# Patient Record
Sex: Male | Born: 1955 | Hispanic: No | Smoking: Never smoker
Health system: Southern US, Community
[De-identification: ages and names within clinical notes are randomized; demographics above are authoritative.]

## PROBLEM LIST (undated history)

## (undated) DIAGNOSIS — C61 Malignant neoplasm of prostate: Secondary | ICD-10-CM

## (undated) DIAGNOSIS — M519 Unspecified thoracic, thoracolumbar and lumbosacral intervertebral disc disorder: Secondary | ICD-10-CM

## (undated) DIAGNOSIS — E785 Hyperlipidemia, unspecified: Secondary | ICD-10-CM

## (undated) DIAGNOSIS — I10 Essential (primary) hypertension: Secondary | ICD-10-CM

## (undated) HISTORY — DX: Malignant neoplasm of prostate: C61

## (undated) HISTORY — DX: Essential (primary) hypertension: I10

## (undated) HISTORY — DX: Unspecified thoracic, thoracolumbar and lumbosacral intervertebral disc disorder: M51.9

## (undated) HISTORY — DX: Hyperlipidemia, unspecified: E78.5

## (undated) HISTORY — PX: LUMBAR DISC SURGERY: SHX700

---
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
## 2004-06-20 ENCOUNTER — Emergency Department (HOSPITAL_COMMUNITY): Admission: EM | Admit: 2004-06-20 | Discharge: 2004-06-20 | Payer: Self-pay | Admitting: Emergency Medicine

## 2010-07-17 ENCOUNTER — Ambulatory Visit: Payer: Self-pay | Admitting: Surgery

## 2010-07-17 ENCOUNTER — Emergency Department (HOSPITAL_COMMUNITY): Admission: EM | Admit: 2010-07-17 | Discharge: 2010-07-17 | Payer: Self-pay | Admitting: Emergency Medicine

## 2010-09-30 ENCOUNTER — Encounter: Admission: RE | Admit: 2010-09-30 | Discharge: 2010-09-30 | Payer: Self-pay | Admitting: Rheumatology

## 2011-01-11 LAB — POCT I-STAT, CHEM 8
HCT: 50 % (ref 39.0–52.0)
Hemoglobin: 17 g/dL (ref 13.0–17.0)
Sodium: 141 mEq/L (ref 135–145)
TCO2: 25 mmol/L (ref 0–100)

## 2011-01-11 LAB — COMPREHENSIVE METABOLIC PANEL
Alkaline Phosphatase: 63 U/L (ref 39–117)
BUN: 10 mg/dL (ref 6–23)
Chloride: 108 mEq/L (ref 96–112)
Glucose, Bld: 93 mg/dL (ref 70–99)
Potassium: 3.8 mEq/L (ref 3.5–5.1)
Total Bilirubin: 0.7 mg/dL (ref 0.3–1.2)

## 2011-01-11 LAB — DIFFERENTIAL
Basophils Absolute: 0 10*3/uL (ref 0.0–0.1)
Eosinophils Absolute: 0.1 10*3/uL (ref 0.0–0.7)
Lymphocytes Relative: 29 % (ref 12–46)
Neutrophils Relative %: 63 % (ref 43–77)

## 2011-01-11 LAB — POCT CARDIAC MARKERS
Myoglobin, poc: 148 ng/mL (ref 12–200)
Myoglobin, poc: 88.1 ng/mL (ref 12–200)

## 2011-01-11 LAB — CBC
HCT: 40.5 % (ref 39.0–52.0)
MCH: 22.9 pg — ABNORMAL LOW (ref 26.0–34.0)
MCV: 64.4 fL — ABNORMAL LOW (ref 78.0–100.0)
RBC: 6.29 MIL/uL — ABNORMAL HIGH (ref 4.22–5.81)
RDW: 16.2 % — ABNORMAL HIGH (ref 11.5–15.5)
WBC: 3.6 10*3/uL — ABNORMAL LOW (ref 4.0–10.5)

## 2011-01-11 LAB — D-DIMER, QUANTITATIVE: D-Dimer, Quant: <0.22 ug/mL-FEU (ref 0.00–0.48)

## 2011-02-22 ENCOUNTER — Encounter: Payer: Self-pay | Admitting: Family Medicine

## 2011-11-08 ENCOUNTER — Encounter (HOSPITAL_COMMUNITY): Payer: Self-pay | Admitting: *Deleted

## 2011-11-08 ENCOUNTER — Emergency Department (HOSPITAL_COMMUNITY)
Admission: EM | Admit: 2011-11-08 | Discharge: 2011-11-09 | Disposition: A | Discharge status: Home / Self Care | Attending: Emergency Medicine | Admitting: Emergency Medicine

## 2011-11-08 ENCOUNTER — Emergency Department (HOSPITAL_COMMUNITY)

## 2011-11-08 DIAGNOSIS — R51 Headache: Secondary | ICD-10-CM | POA: Insufficient documentation

## 2011-11-08 DIAGNOSIS — IMO0002 Reserved for concepts with insufficient information to code with codable children: Secondary | ICD-10-CM | POA: Insufficient documentation

## 2011-11-08 DIAGNOSIS — S0003XA Contusion of scalp, initial encounter: Secondary | ICD-10-CM | POA: Insufficient documentation

## 2011-11-08 DIAGNOSIS — S060XAA Concussion with loss of consciousness status unknown, initial encounter: Secondary | ICD-10-CM | POA: Insufficient documentation

## 2011-11-08 DIAGNOSIS — Y9269 Other specified industrial and construction area as the place of occurrence of the external cause: Secondary | ICD-10-CM | POA: Insufficient documentation

## 2011-11-08 DIAGNOSIS — Y990 Civilian activity done for income or pay: Secondary | ICD-10-CM | POA: Insufficient documentation

## 2011-11-08 DIAGNOSIS — R42 Dizziness and giddiness: Secondary | ICD-10-CM | POA: Insufficient documentation

## 2011-11-08 DIAGNOSIS — W208XXA Other cause of strike by thrown, projected or falling object, initial encounter: Secondary | ICD-10-CM | POA: Insufficient documentation

## 2011-11-08 DIAGNOSIS — S060X9A Concussion with loss of consciousness of unspecified duration, initial encounter: Secondary | ICD-10-CM | POA: Insufficient documentation

## 2011-11-08 NOTE — ED Provider Notes (Signed)
History     CSN: [id number]  Arrival date & time 11/08/11  2006   First MD Initiated Contact with Patient 11/08/11 2157      Chief Complaint  Patient presents with  . Head Injury    (Consider location/radiation/quality/duration/timing/severity/associated sxs/prior treatment) HPI history from patient Patient is a 56 year old male presents with head injury. This happened a few hours prior to arrival. 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 He was not knocked out.  He however describes feeling woozy and days. He has had mild to moderate global headache since then. He has not had any altered mental status, vomiting, nausea. He has been able to ambulate without difficulty and has no weakness. He denies dental trauma or malocclusion. He also denies neck or back pain. No other injuries. Symptoms have been constant. No significant modifying factor. He has mild abrasion on his scalp. Tetanus is up-to-date.  History reviewed. No pertinent past medical history.  History reviewed. No pertinent past surgical history.  History reviewed. No pertinent family history.  History  Substance Use Topics  . Smoking status: Never Smoker   . Smokeless tobacco: Not on file  . Alcohol Use: Yes      Review of Systems  Constitutional: Negative for fever, chills and activity change.  HENT: Negative for congestion and neck pain.   Respiratory: Negative for cough, chest tightness, shortness of breath and wheezing.   Cardiovascular: Negative for chest pain.  Gastrointestinal: Negative for nausea, vomiting, abdominal pain, diarrhea and abdominal distention.  Genitourinary: Negative for difficulty urinating.  Musculoskeletal: Negative for back pain and gait problem.  Skin: Negative for rash.  Neurological: Negative for weakness and numbness.  Psychiatric/Behavioral: Negative for behavioral problems and confusion.  All  other systems reviewed and are negative.    Allergies  Review of patient's allergies indicates no known allergies.  Home Medications  No current outpatient prescriptions on file.  BP 148/78  Pulse 53  Temp(Src) 98.1 F (36.7 C) (Oral)  Resp 18  SpO2 98%  Physical Exam  Nursing note and vitals reviewed. Constitutional: He is oriented to person, place, and time. He appears well-developed and well-nourished. No distress.  HENT:  Head: Normocephalic and atraumatic.  Nose: Nose normal.       No malalignment. No dental trauma. Mild abrasion and hematoma over frontal scalp.  Eyes: EOM are normal. Pupils are equal, round, and reactive to light.  Neck: Normal range of motion. Neck supple. No JVD present.       No C spine TTP No visible injury No step offs  Cardiovascular: Normal rate, regular rhythm and intact distal pulses.   Pulmonary/Chest: Effort normal and breath sounds normal. No respiratory distress. He has no wheezes. He exhibits no tenderness.  Abdominal: Soft. He exhibits no distension. There is no tenderness.       No visible injury   Musculoskeletal: Normal range of motion. He exhibits no edema and no tenderness.       No T/L spine TTP No step offs No visible injury    Neurological: He is alert and oriented to person, place, and time. GCS eye subscore is 4. GCS verbal subscore is 5. GCS motor subscore is 6.       Normal strength  Skin: Skin is warm and dry. He is not diaphoretic.  Psychiatric: He has a normal mood and affect. His behavior is normal. Thought content  normal.    ED Course  Procedures (including critical care time)  Labs Reviewed - No data to display Ct Head Wo Contrast  11/08/2011  *RADIOLOGY REPORT*  Clinical Data: Head injury.  Head trauma.Headache.  CT HEAD WITHOUT CONTRAST  Technique:  Contiguous axial images were obtained from the base of the skull through the vertex without contrast.  Comparison: None.  Findings: Tiny lacunar infarct versus  dilated perivascular space in the right basal ganglia. No mass lesion, mass effect, midline shift, hydrocephalus, hemorrhage.  No territorial ischemia or acute infarction. There is no skull fracture.  Paranasal sinuses are within normal limits.  Mild soft tissue swelling is present over the midline of the scalp.  Probable bandage material overlying this region.  IMPRESSION: No acute intracranial abnormality.  Frontal scalp soft tissue swelling.  Original Report Authenticated By: Andreas Newport, M.D.     1. Concussion       MDM    Clinical picture is consistent with concussion. CT scan of head is negative. C-spine clinically cleared by [facility].  Patient is well appearing in discharge. Return precautions discussed as well as concussion precautions.   Milus Glazier 11/09/11 0140

## 2011-11-08 NOTE — ED Provider Notes (Signed)
56 year old male was struck by a descending a door that functions like a garage door. It struck him on his frontal area and he states he was momentarily dazed. He denies other injury. There's been no nausea vomiting or dizziness or incoordination. On examination, he has 2 small punctures in the frontal area and no laceration which is going to require repair. Because of being paced, he will have a CT scan done to make sure there is no intracranial injury. He is up-to-date on tetanus immunizations.  Dione Booze, MD 11/08/11 2230

## 2011-11-08 NOTE — ED Notes (Signed)
Lac forehead.  No bleeding at present

## 2011-11-08 NOTE — ED Notes (Signed)
He was at work at the [employer] and he was struck in the head by a bay door.  No loc.  Headache since then.

## 2011-11-09 NOTE — ED Notes (Signed)
Discharge inst given  Voiced understanding 

## 2011-11-11 NOTE — ED Provider Notes (Signed)
Medical screening examination/treatment/procedure(s) were performed by non-physician practitioner and as supervising physician I was immediately available for consultation/collaboration.   Brailey Buescher, MD 11/11/11 0827 

## 2012-10-13 ENCOUNTER — Emergency Department (HOSPITAL_BASED_OUTPATIENT_CLINIC_OR_DEPARTMENT_OTHER)
Admission: EM | Admit: 2012-10-13 | Discharge: 2012-10-13 | Disposition: A | Discharge status: Home / Self Care | Payer: Federal, State, Local not specified - PPO | Attending: Emergency Medicine | Admitting: Emergency Medicine

## 2012-10-13 ENCOUNTER — Encounter (HOSPITAL_BASED_OUTPATIENT_CLINIC_OR_DEPARTMENT_OTHER): Payer: Self-pay | Admitting: *Deleted

## 2012-10-13 DIAGNOSIS — M5432 Sciatica, left side: Secondary | ICD-10-CM

## 2012-10-13 DIAGNOSIS — Z8739 Personal history of other diseases of the musculoskeletal system and connective tissue: Secondary | ICD-10-CM | POA: Insufficient documentation

## 2012-10-13 DIAGNOSIS — M5430 Sciatica, unspecified side: Secondary | ICD-10-CM | POA: Insufficient documentation

## 2012-10-13 MED ORDER — KETOROLAC TROMETHAMINE 60 MG/2ML IM SOLN
60.0000 mg | Freq: Once | INTRAMUSCULAR | Status: AC
  Administered 2012-10-13: 60 mg via INTRAMUSCULAR
  Filled 2012-10-13: qty 2

## 2012-10-13 MED ORDER — PREDNISONE 10 MG PO TABS: ORAL_TABLET | ORAL | Status: DC

## 2012-10-13 MED ORDER — HYDROCODONE-ACETAMINOPHEN 5-325 MG PO TABS: 2.0000 | ORAL_TABLET | ORAL | Status: AC | PRN

## 2012-10-13 NOTE — ED Provider Notes (Signed)
History     CSN: [id number]  Arrival date & time 10/13/12  2053   None     Chief Complaint  Patient presents with  . Leg Pain    (Consider location/radiation/quality/duration/timing/severity/associated sxs/prior treatment) Patient is a 56 y.o. male presenting with back pain. The history is provided by the patient. No language interpreter was used.  Back Pain  This is a new problem. Episode onset: 4 days. The problem occurs constantly. The problem has been gradually worsening. The pain is associated with lifting heavy objects. The pain is present in the gluteal region. The quality of the pain is described as stabbing and shooting. The pain is at a severity of 6/10. The pain is moderate. The pain is the same all the time. Stiffness is present all day. He has tried nothing for the symptoms. The treatment provided moderate relief. Risk factors: hx of back problems.    History reviewed. No pertinent past medical history.  Past Surgical History  Procedure Date  . Back surgery     No family history on file.  History  Substance Use Topics  . Smoking status: Never Smoker   . Smokeless tobacco: Not on file  . Alcohol Use: Yes      Review of Systems  Musculoskeletal: Positive for back pain.  All other systems reviewed and are negative.    Allergies  Review of patient's allergies indicates no known allergies.  Home Medications  No current outpatient prescriptions on file.  BP 130/67  Pulse 68  Temp 98.3 F (36.8 C) (Oral)  Resp 18  Ht 6\' 4"  (1.93 m)  Wt 220 lb (99.791 kg)  BMI 26.78 kg/m2  SpO2 97%  Physical Exam  Nursing note and vitals reviewed. Constitutional: He is oriented to person, place, and time. He appears well-developed and well-nourished.  HENT:  Head: Normocephalic.  Right Ear: External ear normal.  Left Ear: External ear normal.  Nose: Nose normal.  Mouth/Throat: Oropharynx is clear and moist.  Eyes: Conjunctivae normal and EOM are normal.  Pupils are equal, round, and reactive to light.  Neck: Normal range of motion. Neck supple.  Cardiovascular: Normal rate.   Pulmonary/Chest: Effort normal and breath sounds normal.  Abdominal: Soft.  Musculoskeletal:       Tender sciatic notch area   From nv and ns intact  Neurological: He is alert and oriented to person, place, and time. He has normal reflexes.  Skin: Skin is warm.  Psychiatric: He has a normal mood and affect.    ED Course  Procedures (including critical care time)  Labs Reviewed - No data to display No results found.   No diagnosis found.    MDM    Prednisone taper,  Hydrocodone       Lonia Skinner [place], [place] 10/13/12 2327

## 2012-10-13 NOTE — ED Notes (Signed)
Pt c/o left thigh pain since Friday after doing heavy lifting. Pt sts the pain began spreading up to  His left glute on Sunday.

## 2012-10-15 NOTE — ED Provider Notes (Signed)
Medical screening examination/treatment/procedure(s) were performed by non-physician practitioner and as supervising physician I was immediately available for consultation/collaboration.   Gavin Pound. Oletta Lamas, MD 10/15/12 2108

## 2012-10-29 HISTORY — PX: LUMBAR DISC SURGERY: SHX700

## 2012-12-18 ENCOUNTER — Encounter: Payer: Self-pay | Admitting: Sports Medicine

## 2012-12-18 ENCOUNTER — Ambulatory Visit (INDEPENDENT_AMBULATORY_CARE_PROVIDER_SITE_OTHER): Payer: Federal, State, Local not specified - PPO | Admitting: Sports Medicine

## 2012-12-18 VITALS — BP 130/78 | HR 50 | Wt 228.0 lb

## 2012-12-18 DIAGNOSIS — F4389 Other reactions to severe stress: Secondary | ICD-10-CM

## 2012-12-18 DIAGNOSIS — F4329 Adjustment disorder with other symptoms: Secondary | ICD-10-CM | POA: Insufficient documentation

## 2012-12-18 DIAGNOSIS — Z299 Encounter for prophylactic measures, unspecified: Secondary | ICD-10-CM | POA: Insufficient documentation

## 2012-12-18 NOTE — Assessment & Plan Note (Signed)
Tdap one year ago, colonoscopy in 2011. Checking routine blood work. Return For complete physical.

## 2012-12-18 NOTE — Progress Notes (Signed)
Subjective:    CC: Establish care.   HPI:  Stress: Had some issues in the family, as well as stress at work. Denies any depressive symptoms but wondering if there is anyone he can talk to. Would like not to start pharmacologic intervention just yet.  Preventative measures: Last colonoscopy up-to-date, up-to-date on tetanus shots, would like some routine blood work done, otherwise no complaints.  Past medical history, Surgical history, Family history not pertinant except as noted below, Social history, Allergies, and medications have been entered into the medical record, reviewed, and no changes needed.   Review of Systems: No headache, visual changes, nausea, vomiting, diarrhea, constipation, dizziness, abdominal pain, skin rash, fevers, chills, night sweats, swollen lymph nodes, weight loss, chest pain, body aches, joint swelling, muscle aches, shortness of breath, mood changes, visual or auditory hallucinations.  Objective:    General: Well Developed, well nourished, and in no acute distress.  Neuro: Alert and oriented x3, extra-ocular muscles intact, sensation grossly intact.  HEENT: Normocephalic, atraumatic, pupils equal round reactive to light, neck supple, no masses, no lymphadenopathy, thyroid nonpalpable.  Skin: Warm and dry, no rashes noted.  Cardiac: Regular rate and rhythm, no murmurs rubs or gallops.  Respiratory: Clear to auscultation bilaterally. Not using accessory muscles, speaking in full sentences.  Abdominal: Soft, nontender, nondistended, positive bowel sounds, no masses, no organomegaly.  Musculoskeletal: Shoulder, elbow, wrist, hip, knee, ankle stable, and with full range of motion. Impression and Recommendations:   The patient was counselled, risk factors were discussed, anticipatory guidance given.

## 2012-12-18 NOTE — Assessment & Plan Note (Signed)
Patient would desire psychotherapy rather than pharmacologic therapy at this time. I will place a referral downstairs.

## 2013-01-02 ENCOUNTER — Encounter: Payer: Self-pay | Admitting: Sports Medicine

## 2013-01-09 ENCOUNTER — Encounter: Payer: Self-pay | Admitting: Sports Medicine

## 2018-07-30 DIAGNOSIS — Z1211 Encounter for screening for malignant neoplasm of colon: Secondary | ICD-10-CM | POA: Diagnosis not present

## 2018-08-13 DIAGNOSIS — K5730 Diverticulosis of large intestine without perforation or abscess without bleeding: Secondary | ICD-10-CM | POA: Diagnosis not present

## 2018-08-13 DIAGNOSIS — Z1211 Encounter for screening for malignant neoplasm of colon: Secondary | ICD-10-CM | POA: Diagnosis not present

## 2018-08-13 DIAGNOSIS — D123 Benign neoplasm of transverse colon: Secondary | ICD-10-CM | POA: Diagnosis not present

## 2018-08-13 DIAGNOSIS — K635 Polyp of colon: Secondary | ICD-10-CM | POA: Diagnosis not present

## 2018-08-13 LAB — HM COLONOSCOPY

## 2018-09-24 ENCOUNTER — Ambulatory Visit (INDEPENDENT_AMBULATORY_CARE_PROVIDER_SITE_OTHER): Payer: Federal, State, Local not specified - PPO | Admitting: Family Medicine

## 2018-09-24 ENCOUNTER — Encounter: Payer: Self-pay | Admitting: Family Medicine

## 2018-09-24 VITALS — BP 162/84 | HR 52 | Temp 98.2°F | Ht 75.0 in | Wt 214.4 lb

## 2018-09-24 DIAGNOSIS — I159 Secondary hypertension, unspecified: Secondary | ICD-10-CM

## 2018-09-24 DIAGNOSIS — R109 Unspecified abdominal pain: Secondary | ICD-10-CM

## 2018-09-24 DIAGNOSIS — Z13220 Encounter for screening for lipoid disorders: Secondary | ICD-10-CM | POA: Diagnosis not present

## 2018-09-24 DIAGNOSIS — M545 Low back pain, unspecified: Secondary | ICD-10-CM

## 2018-09-24 DIAGNOSIS — Z131 Encounter for screening for diabetes mellitus: Secondary | ICD-10-CM | POA: Diagnosis not present

## 2018-09-24 LAB — POCT URINALYSIS DIPSTICK
Bilirubin, UA: NEGATIVE
GLUCOSE UA: NEGATIVE
Ketones, UA: NEGATIVE
LEUKOCYTES UA: NEGATIVE
NITRITE UA: NEGATIVE
PH UA: 6.5 (ref 5.0–8.0)
PROTEIN UA: NEGATIVE
RBC UA: NEGATIVE
SPEC GRAV UA: 1.02 (ref 1.010–1.025)
UROBILINOGEN UA: 0.2 U/dL

## 2018-09-24 LAB — CBC WITH DIFFERENTIAL/PLATELET
BASOS PCT: 1 % (ref 0.0–3.0)
Basophils Absolute: 0 10*3/uL (ref 0.0–0.1)
EOS ABS: 0.3 10*3/uL (ref 0.0–0.7)
Eosinophils Relative: 6 % — ABNORMAL HIGH (ref 0.0–5.0)
HCT: 45.3 % (ref 39.0–52.0)
Hemoglobin: 14.9 g/dL (ref 13.0–17.0)
Lymphocytes Relative: 44.5 % (ref 12.0–46.0)
Lymphs Abs: 2 10*3/uL (ref 0.7–4.0)
MCHC: 32.9 g/dL (ref 30.0–36.0)
MCV: 69.6 fl — ABNORMAL LOW (ref 78.0–100.0)
MONO ABS: 0.4 10*3/uL (ref 0.1–1.0)
Monocytes Relative: 8.7 % (ref 3.0–12.0)
Neutro Abs: 1.8 10*3/uL (ref 1.4–7.7)
Neutrophils Relative %: 39.8 % — ABNORMAL LOW (ref 43.0–77.0)
PLATELETS: 202 10*3/uL (ref 150.0–400.0)
RBC: 6.51 Mil/uL — ABNORMAL HIGH (ref 4.22–5.81)
RDW: 16.9 % — ABNORMAL HIGH (ref 11.5–15.5)
WBC: 4.6 10*3/uL (ref 4.0–10.5)

## 2018-09-24 LAB — COMPREHENSIVE METABOLIC PANEL
ALT: 23 U/L (ref 0–53)
AST: 33 U/L (ref 0–37)
Albumin: 4.3 g/dL (ref 3.5–5.2)
Alkaline Phosphatase: 74 U/L (ref 39–117)
BILIRUBIN TOTAL: 0.8 mg/dL (ref 0.2–1.2)
BUN: 14 mg/dL (ref 6–23)
CALCIUM: 9.5 mg/dL (ref 8.4–10.5)
CHLORIDE: 105 meq/L (ref 96–112)
CO2: 28 meq/L (ref 19–32)
CREATININE: 0.82 mg/dL (ref 0.40–1.50)
GFR: 101.6 mL/min (ref >60.00)
Glucose, Bld: 84 mg/dL (ref 70–99)
Potassium: 4.4 mEq/L (ref 3.5–5.1)
SODIUM: 139 meq/L (ref 135–145)
Total Protein: 7.4 g/dL (ref 6.0–8.3)

## 2018-09-24 LAB — LIPID PANEL
CHOL/HDL RATIO: 3
Cholesterol: 201 mg/dL — ABNORMAL HIGH (ref 0–200)
HDL: 68.9 mg/dL (ref >39.00)
LDL CALC: 120 mg/dL — AB (ref 0–99)
NONHDL: 131.75
Triglycerides: 58 mg/dL (ref 0.0–149.0)
VLDL: 11.6 mg/dL (ref 0.0–40.0)

## 2018-09-24 MED ORDER — ADULT BLOOD PRESSURE CUFF LG KIT: 1.0000 | PACK | Freq: Every day | 0 refills | Status: DC

## 2018-09-24 NOTE — Progress Notes (Signed)
Michael [name] DOB: [birth year] Encounter date: 09/24/2018  This isa 62 y.o. male who presents to establish care. Chief Complaint  Patient presents with  . New Patient (Initial Visit)    back tension, comes and goes. Pt states that after he eats it goes away    History of present illness: Had colonoscopy a little over a month ago and had a polyp that was removed.   Has had some tension going on now intermittently for 6 weeks. Feels this deep in back on sides from left to right. Wondered about kidneys causing it. Feels like muscle. Sometimes gets much better but still feels a little tension there all the time. Back will pop every once in awhile and it feels better when this happens. Does seem to be going away overall. No urine odor. No dysuria. Has had kidney stones in past and is not at all similar. Did change diet, started eating more vegetables. Not had issues with kidney stones recently. Did note some relief in the back discomfort about 30 minutes after eating. Not sure if related to spices in food. Turmeric, pepper are focus.   Blood pressure was somewhat high when he went for colonoscopy as well. Not been treated for bp in the past.   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 Close to 120's/80's.   Nerve impingement 2014: when friend (massage therapist) did some work on him in past it made it feel worse. Back is much better now. No known injury. States that impingement was L5-S1. Left leg bothered him at that point.   Job is pretty physical. Does a lot of twisting, moving, walking. Some days muscles are tighter than others. Moving around easier now than he was a few weeks ago.    History reviewed. No pertinent past medical history. Past Surgical History:  Procedure Laterality Date  . Miranda SURGERY  2014  . right ankle fracture  1978   external pinning   No Known Allergies No outpatient medications have been marked as taking for the 09/24/18  encounter (Office Visit) with [name], MD.   Social History   Tobacco Use  . Smoking status: Never Smoker  . Smokeless tobacco: Never Used  Substance Use Topics  . Alcohol use: Yes    Comment: rare   Family History  Problem Relation Age of Onset  . High blood pressure Mother      Review of Systems  Constitutional: Negative for chills, fatigue and fever.  Respiratory: Negative for cough, chest tightness, shortness of breath and wheezing.   Cardiovascular: Negative for chest pain, palpitations and leg swelling.  Gastrointestinal: Negative for abdominal pain, constipation, diarrhea, nausea and vomiting.  Musculoskeletal: Positive for back pain.  Neurological: Negative for headaches.    Objective:  BP (!) 162/84   Pulse (!) 52   Temp 98.2 F (36.8 C) (Oral)   Ht 6\' 3"  (1.905 m)   Wt 214 lb 6.4 oz (97.3 kg)   SpO2 97%   BMI 26.80 kg/m   Weight: 214 lb 6.4 oz (97.3 kg)   BP Readings from Last 3 Encounters:  09/24/18 (!) 162/84  12/18/12 130/78  10/13/12 130/67   Wt Readings from Last 3 Encounters:  09/24/18 214 lb 6.4 oz (97.3 kg)  12/18/12 228 lb (103.4 kg)  10/13/12 220 lb (99.8 kg)    Physical Exam  Constitutional: He is oriented to person, place, and time. He appears well-developed and well-nourished. No  distress.  Cardiovascular: Normal rate, regular rhythm and normal heart sounds. Exam reveals no friction rub.  No murmur heard. No lower extremity edema  Pulmonary/Chest: Effort normal and breath sounds normal. No respiratory distress. He has no wheezes. He has no rales.  Abdominal: Soft. Bowel sounds are normal. He exhibits no distension and no mass. There is no tenderness. There is no guarding.  Musculoskeletal:  Tightness across lower back with flexion to 90 deg and increased with extension 10 deg.   No limit with ROM otherwise. Increased tightness with extension/twist bilat. Same with tilting.   Neurological: He is alert and oriented to  person, place, and time.  Reflex Scores:      Patellar reflexes are 2+ on the right side and 2+ on the left side.      Achilles reflexes are 2+ on the right side and 2+ on the left side. Psychiatric: His behavior is normal. Cognition and memory are normal.    Assessment/Plan:  1. Low back pain, unspecified back pain laterality, unspecified chronicity, unspecified whether sciatica present Consider natural anti-inflammatories for a start, gradual stretches.   2. Lipid screening   3. Screening for diabetes mellitus bloodwork today.   4. Flank pain Checking some bloodwork for start. Suspect more musculoskeletal at this point.   5. Secondary hypertension Check at home and record numbers; return for recheck in 1 month. Call if numbers regularly elevated at home and we can start treatment prior to visit.   Return in about 1 month (around 10/24/2018) for Chronic condition visit - blood pressure recheck.  Micheline Rough, MD

## 2018-09-24 NOTE — Patient Instructions (Signed)
Check blood pressures daily at home and record. If running over 145/90 on a regular basis, let me know. Otherwise record and bring readings and cuff to next visit.

## 2018-09-26 ENCOUNTER — Encounter: Payer: Self-pay | Admitting: Family Medicine

## 2018-10-13 ENCOUNTER — Telehealth: Payer: Self-pay | Admitting: Family Medicine

## 2018-10-13 NOTE — Telephone Encounter (Signed)
This encounter was created in error - please disregard.

## 2018-10-13 NOTE — Telephone Encounter (Signed)
.      Pt given lab results per notes of korlein on 09/24/18. Pt verbalized understanding. Patient inquiring if he would have to pay for another blood draw since labs cant be added onPatient state that he does have sickle cell trait.

## 2018-10-15 NOTE — Telephone Encounter (Signed)
Left a detailed message on verified voice mail.   

## 2018-10-15 NOTE — Telephone Encounter (Signed)
The sickle cell trait explains his bloodwork. He does not need further bloodwork.

## 2018-10-24 ENCOUNTER — Ambulatory Visit: Payer: Federal, State, Local not specified - PPO | Admitting: Family Medicine

## 2018-10-24 ENCOUNTER — Encounter: Payer: Self-pay | Admitting: Family Medicine

## 2018-10-24 VITALS — BP 138/88 | HR 53 | Temp 98.6°F | Wt 209.6 lb

## 2018-10-24 DIAGNOSIS — I10 Essential (primary) hypertension: Secondary | ICD-10-CM | POA: Diagnosis not present

## 2018-10-24 DIAGNOSIS — M545 Low back pain, unspecified: Secondary | ICD-10-CM

## 2018-10-24 NOTE — Patient Instructions (Signed)
Continue to occasionally check blood pressure at home. I will let you know about MRI results when I get them.

## 2018-10-24 NOTE — Progress Notes (Signed)
Michael [name] DOB: Oct 28, 1956 Encounter date: 10/24/2018  This is a 62 y.o. male who presents with Chief Complaint  Patient presents with  . Follow-up    lower back pain, pt states that today it is all the way across the back but sometimes pain will move from left to right side, Bp has been inconsistant at home, Avg Approx. 140/80     History of present illness: Asked to return with recorded blood pressures due to elevation at previous visit 11/27. States that lowest is 749/44 up to 967 systolic. Has been very mindful of sodium intake and has a plant based diet so surprised that bp has been elevated. Most blood pressures are over 145 with many in the 591'M systolic. Home cuff reading 169/110 today compared to our 150/100.   Still with gnawing right lower back pain. Gets some radiation of this around to side. Not unbearable, but noticeable. Been there since October. There are days he doesn't feel it at all. Notes less when moving around. Feels like discomfort moves around "like fluid". Really bothering him. Keeping him from doing some of regular exercise routines. Discomfort is stopping him.   Has been checking urine to make sure no blood, no odor. No constipation, no diarrhea. Does affect his movement. Sometimes feels tightness in back when bending over to tie shoes or move in certain ways.   Has been doing more cycling; relates this to weight loss.   Can't do jumping activities anymore; gets a jarring of spine. Hurts in lumbar area. Has had cysts along spine as well; even thoracic one that was lanced in the past. Feels stiffer; discomfort with back is limiting his ability to stretch, exercise, reach toes.   See above. Back discomfort is bothering him on daily basis. Notes pain worse with foods that are more "inflammatory".    Some mornings can't get self up without assistance of arms - has to push self up. Pain 4/10.   No Known Allergies Current Meds  Medication Sig  . Blood  Pressure Monitoring (ADULT BLOOD PRESSURE CUFF LG) KIT 1 Device by Does not apply route daily.    Review of Systems  Objective:  BP 138/88 (BP Location: Left Arm, Patient Position: Sitting, Cuff Size: Normal)   Pulse (!) 53   Temp 98.6 F (37 C) (Oral)   Wt 209 lb 9.6 oz (95.1 kg)   SpO2 98%   BMI 26.20 kg/m   Weight: 209 lb 9.6 oz (95.1 kg)   BP Readings from Last 3 Encounters:  10/24/18 138/88  09/24/18 (!) 162/84  12/18/12 130/78   Wt Readings from Last 3 Encounters:  10/24/18 209 lb 9.6 oz (95.1 kg)  09/24/18 214 lb 6.4 oz (97.3 kg)  12/18/12 228 lb (103.4 kg)    Physical Exam Musculoskeletal:     Comments: No spinal tenderness to palpation. There is muscle spasm right paraspinal musculature. There is increase pain with right twist and with extension on right side.   Neurological:     Mental Status: He is alert.     Deep Tendon Reflexes:     Reflex Scores:      Patellar reflexes are 1+ on the right side and 2+ on the left side.      Achilles reflexes are 1+ on the right side and 2+ on the left side.    Comments: Negative straight leg raise. There is slight decreased reflex right patellar, achilles.      Assessment/Plan  1. Low back pain  without sciatica, unspecified back pain laterality, unspecified chronicity Has some chronic disc issues apparent on previous imaging from 2011; pain consistent and increased with pressure sensation in lower back. Has done well in past with disc issues getting injections; would like MRI for further evaluation/diagnostic help.  - MR LUMBAR SPINE WO CONTRAST; Future  2. Hypertension, unspecified type Recheck in office today was improved. We discussed options with blood pressure and he would like to monitor - would like to     Return pending MRI results.     Micheline Rough, MD

## 2018-10-27 ENCOUNTER — Telehealth: Payer: Self-pay | Admitting: Family Medicine

## 2018-10-27 ENCOUNTER — Encounter: Payer: Self-pay | Admitting: *Deleted

## 2018-10-27 NOTE — Telephone Encounter (Signed)
Please advise. Which labs would you like ordered.

## 2018-10-27 NOTE — Telephone Encounter (Signed)
Patient called for lab results, results given per notes Dr. Ethlyn Gallery on 09/29/18, he verbalized understanding. He says he is aware of the anemia, but has not been worked up. Lab appointment scheduled for Friday, 11/07/18 at 0830. Unable to document in result notes. Orders will need to be entered.  Notes recorded by [name], MD on 09/29/2018 at 2:43 PM EST Overall bloodwork looks ok. Blood cells are small though - usually from iron deficiency or from inherited anemia (thalassemia). Looks like this has been present for some time. Is he aware of this? Further workup ever done? If not it might be reasonable to get some additional bloodwork. Unfortunately lab couldn't add on to previous bloodwork.

## 2018-11-02 ENCOUNTER — Other Ambulatory Visit: Payer: Self-pay | Admitting: Family Medicine

## 2018-11-02 ENCOUNTER — Ambulatory Visit
Admission: RE | Admit: 2018-11-02 | Discharge: 2018-11-02 | Disposition: A | Discharge status: Home / Self Care | Payer: Federal, State, Local not specified - PPO | Source: Ambulatory Visit | Attending: Family Medicine | Admitting: Family Medicine

## 2018-11-02 DIAGNOSIS — M48061 Spinal stenosis, lumbar region without neurogenic claudication: Secondary | ICD-10-CM | POA: Diagnosis not present

## 2018-11-02 DIAGNOSIS — M545 Low back pain, unspecified: Secondary | ICD-10-CM

## 2018-11-02 DIAGNOSIS — D649 Anemia, unspecified: Secondary | ICD-10-CM

## 2018-11-02 NOTE — Telephone Encounter (Signed)
Addressed in separate message

## 2018-11-02 NOTE — Progress Notes (Signed)
There have been a couple of messages regarding his bloodwork. Since he knows he has sickle cell trait, that explains his subtle anemia. It has been about 8 years since I Have a comparison, but it is stable. We could certainly make sure there is nothing worsening this like an iron deficiency, but I do not think that this is urgent. I have ordered iron studies to get a better idea of any underlying issues (please order peripheral smear as I cannot get this to go through for some reason!?), but I think it would be ok if we just monitored for stability and checked these with future bloodwork. If noting more fatigue than typical, go ahead and complete.

## 2018-11-03 NOTE — Progress Notes (Signed)
Left message for patient to call back. CRM created 

## 2018-11-05 ENCOUNTER — Encounter: Payer: Self-pay | Admitting: Family Medicine

## 2018-11-05 NOTE — Telephone Encounter (Signed)
If he would like to follow back up with them that's fine; you can put in referral for him. If we cannot see records we can get release next time he is in (he doesn't need to make special trip)

## 2018-11-07 ENCOUNTER — Other Ambulatory Visit (INDEPENDENT_AMBULATORY_CARE_PROVIDER_SITE_OTHER): Payer: Federal, State, Local not specified - PPO

## 2018-11-07 ENCOUNTER — Encounter: Payer: Self-pay | Admitting: Family Medicine

## 2018-11-07 DIAGNOSIS — D649 Anemia, unspecified: Secondary | ICD-10-CM

## 2018-11-07 LAB — FERRITIN: Ferritin: 34.5 ng/mL (ref 22.0–322.0)

## 2018-11-08 LAB — IRON, TOTAL/TOTAL IRON BINDING CAP
%SAT: 24 % (ref 20–48)
Iron: 79 ug/dL (ref 50–180)
TIBC: 324 ug/dL (ref 250–425)

## 2018-11-10 ENCOUNTER — Encounter: Payer: Self-pay | Admitting: Family Medicine

## 2018-11-11 NOTE — Progress Notes (Signed)
Left message for patient to call back  

## 2018-11-12 NOTE — Progress Notes (Signed)
Unable to reach patient by phone. I have also sent a MyChart message.

## 2018-11-14 ENCOUNTER — Encounter: Payer: Self-pay | Admitting: Family Medicine

## 2018-11-18 NOTE — Telephone Encounter (Signed)
Please advise. Does patient need an OV with you for this or will he just need a lab appointment?

## 2018-11-19 NOTE — Telephone Encounter (Signed)
Can you just call him back and get him in for 30 minute appointment sometime? He has had a few questions through my chart and concerns about further urine and blood testing. I think a discussion of options with testing and what we are looking for before I order bloodwork is important.

## 2018-11-21 ENCOUNTER — Ambulatory Visit: Payer: Federal, State, Local not specified - PPO | Admitting: Family Medicine

## 2018-11-21 ENCOUNTER — Encounter: Payer: Self-pay | Admitting: Family Medicine

## 2018-11-21 VITALS — BP 142/82 | HR 54 | Temp 97.5°F | Wt 216.7 lb

## 2018-11-21 DIAGNOSIS — Z202 Contact with and (suspected) exposure to infections with a predominantly sexual mode of transmission: Secondary | ICD-10-CM

## 2018-11-21 NOTE — Progress Notes (Signed)
[name] DOB: [birth year] Encounter date: 11/21/2018  This is a 63 y.o. male who presents with Chief Complaint  Patient presents with  . blood work    History of present illness:  [place] is here today after I asked him to come in following a few patient mychart messages. He had some back discomfort and then with some thinking started wondering about there being pathogens or parasites that he could have. He wanted to make sure that there was nothing going on in his system that could cause problems over time.   However, in the last couple of days, drank a chocolate oatmeal/blueberry plant based smoothie and noted improvement in his back discomfort within a few hours of drinking this. Wondering if he wasn't getting enough amino acids in diet and that the protein burst he had helped him out. Pain has not come back. Flexibility is better and back is better still. Had another smoothie last night. Usually in morning he has to roll on elbow to get up and this morning due to back stiffness and  was able to get right up out of bed.   Just wanting to make sure nothing hiding in body and no underlying infection. Started having concern about parasites/pathogens in system when his back discomfort was moving from one side to other. This has improved at present. No blood in stool, no blood in urine. No unusual odor to stools/urine. Only loose stools come if he eats nut mix more. No fevers, chills night sweats. No STD exposure that he is aware of although he has had gonorrhea in the remote past. Tries to be smarter about being safe now. He has not traveled outside of the country. No unusual food exposure. He follows a plant based diet.     No Known Allergies Current Meds  Medication Sig  . Blood Pressure Monitoring (ADULT BLOOD PRESSURE CUFF LG) KIT 1 Device by Does not apply route daily.    Review of Systems  Constitutional: Negative for chills, fatigue and fever.  Respiratory: Negative for  cough, chest tightness, shortness of breath and wheezing.   Cardiovascular: Negative for chest pain, palpitations and leg swelling.  Gastrointestinal: Negative for abdominal distention, abdominal pain, blood in stool, constipation, diarrhea, nausea and vomiting.  Genitourinary: Negative for difficulty urinating, discharge, dysuria, frequency, genital sores and hematuria.  Skin: Negative for rash.    Objective:  BP (!) 142/82 (BP Location: Left Arm, Patient Position: Sitting, Cuff Size: Normal)   Pulse (!) 54   Temp (!) 97.5 F (36.4 C) (Oral)   Wt 216 lb 11.2 oz (98.3 kg)   SpO2 97%   BMI 27.09 kg/m   Weight: 216 lb 11.2 oz (98.3 kg)   BP Readings from Last 3 Encounters:  11/21/18 (!) 142/82  10/24/18 138/88  09/24/18 (!) 162/84   Wt Readings from Last 3 Encounters:  11/21/18 216 lb 11.2 oz (98.3 kg)  10/24/18 209 lb 9.6 oz (95.1 kg)  09/24/18 214 lb 6.4 oz (97.3 kg)    Physical Exam Constitutional:      General: He is not in acute distress.    Appearance: He is well-developed.  Cardiovascular:     Rate and Rhythm: Normal rate and regular rhythm.     Heart sounds: Normal heart sounds. No murmur. No friction rub.  Pulmonary:     Effort: Pulmonary effort is normal. No respiratory distress.     Breath sounds: Normal breath sounds. No wheezing or rales.  Abdominal:  General: Abdomen is flat. Bowel sounds are normal. There is no distension.     Tenderness: There is no abdominal tenderness. There is no guarding or rebound.  Musculoskeletal:     Right lower leg: No edema.     Left lower leg: No edema.  Neurological:     Mental Status: He is alert and oriented to person, place, and time.  Psychiatric:        Behavior: Behavior normal.     Assessment/Plan 1. STD exposure Possible and likely remote exposure, but he would like to make sure that there is no underlying infection. No current symptoms/signs of infection.   - C. trachomatis/N. gonorrhoeae RNA - HIV  Antibody (routine testing w rflx); Future - RPR; Future - [facility] HEPATIT[facility] C SCREENING LAB - HSV(herpes simplex vrs) 1+2 ab-IgG - RPR - HIV Antibody (routine testing w rflx)  Return if symptoms worsen or fail to improve; schedule full physical for spring.   *note we are continuing to monitor blood pressure. He does not want to take any medications and prefers to do everything natural possible to control his health. We discussed risks of elevated blood pressure. We will continue to monitor.    Micheline Rough, MD

## 2018-11-21 NOTE — Patient Instructions (Signed)
I will call you once I get lab results. Keep me posted if back pain returns.   Keep check on blood pressures at home.

## 2018-11-25 ENCOUNTER — Encounter: Payer: Self-pay | Admitting: Family Medicine

## 2018-11-26 LAB — C. TRACHOMATIS/N. GONORRHOEAE RNA
C. trachomatis RNA, TMA: NOT DETECTED
N. gonorrhoeae RNA, TMA: NOT DETECTED

## 2018-11-26 LAB — HSV(HERPES SIMPLEX VRS) I + II AB-IGG
HAV 1 IGG,TYPE SPECIFIC AB: >58 index — ABNORMAL HIGH
HSV 2 IGG,TYPE SPECIFIC AB: <0.9 index

## 2018-11-26 LAB — HIV ANTIBODY (ROUTINE TESTING W REFLEX): HIV 1&2 Ab, 4th Generation: NONREACTIVE

## 2018-11-26 LAB — RPR: RPR: NONREACTIVE

## 2019-02-12 ENCOUNTER — Other Ambulatory Visit: Payer: Self-pay

## 2019-02-12 ENCOUNTER — Encounter: Payer: Self-pay | Admitting: Family Medicine

## 2019-02-12 ENCOUNTER — Ambulatory Visit (INDEPENDENT_AMBULATORY_CARE_PROVIDER_SITE_OTHER): Payer: Federal, State, Local not specified - PPO | Admitting: Family Medicine

## 2019-02-12 ENCOUNTER — Telehealth: Payer: Self-pay

## 2019-02-12 DIAGNOSIS — M545 Low back pain, unspecified: Secondary | ICD-10-CM

## 2019-02-12 NOTE — Progress Notes (Signed)
Patient ID: Michael [name], male   DOB: 1956-10-11, 63 y.o.   MRN: [id number]  Virtual Visit via Video Note  I connected with Michael [name] on 02/12/19 at 10:15 AM EDT by a video enabled telemedicine application and verified that I am speaking with the correct person using two identifiers.  Location patient: home Location provider:work or home office Persons participating in the virtual visit: patient, provider  I discussed the limitations of evaluation and management by telemedicine and the availability of in person appointments. The patient expressed understanding and agreed to proceed.   HPI: Patient is assessed today for low back pain.  He had L5-S1 discectomy back in 2012 but has done relatively well since then.  Has had flareups of back pain off and on.  Works for [employer].  Does some lifting.  Denies any recent injury.  Current pain started about 5 days ago.  He states around [place] he had several jellybeans.  He thinks that sugar intake has promoted inflammation in the past.  His pain somewhat right flank area radiates toward the buttock and lower lumbar area.  Worse with movement.  6 out of 10 in severity at its worst.  Mostly dull ache.  No fever.  No urinary symptoms.  No skin rash.  No numbness or weakness.  No radiculitis symptoms.  No recent appetite or weight changes.  Apparently takes a lot of herbs and he states that he took some oregano and thought that helped.  Has had prior history of kidney stones but he states this pain is different.  Current pain does not radiate anteriorly.   ROS: See pertinent positives and negatives per HPI.  No past medical history on file.  Past Surgical History:  Procedure Laterality Date  . [place] SURGERY  2014  . right ankle fracture  1978   external pinning    Family History  Problem Relation Age of Onset  . High blood pressure Mother     SOCIAL HX: Never smoked   Current Outpatient Medications:  .  Blood Pressure  Monitoring (ADULT BLOOD PRESSURE CUFF LG) KIT, 1 Device by Does not apply route daily., Disp: 1 each, Rfl: 0  EXAM:  VITALS per patient if applicable:  GENERAL: alert, oriented, appears well and in no acute distress  HEENT: atraumatic, conjunttiva clear, no obvious abnormalities on inspection of external nose and ears  NECK: normal movements of the head and neck  LUNGS: on inspection no signs of respiratory distress, breathing rate appears normal, no obvious gross SOB, gasping or wheezing  CV: no obvious cyanosis  MS: moves all visible extremities without noticeable abnormality  PSYCH/NEURO: pleasant and cooperative, no obvious depression or anxiety, speech and thought processing grossly intact  ASSESSMENT AND PLAN:  Discussed the following assessment and plan:  Left lumbar back pain.  Suspect musculoskeletal  -Recommend conservative measures with heat or ice -Reviewed proper lifting -Try some topical sports cream such as Biofreeze and muscle massage -Watch for any new symptoms such as radiculitis symptoms, numbness, weakness.  He will follow-up for any progressive or worsening pain     I discussed the assessment and treatment plan with the patient. The patient was provided an opportunity to ask questions and all were answered. The patient agreed with the plan and demonstrated an understanding of the instructions.   The patient was advised to call back or seek an in-person evaluation if the symptoms worsen or if the condition fails to improve as anticipated.  Carolann Littler, MD

## 2019-02-12 NOTE — Telephone Encounter (Signed)
Patient has an appointment with Dr. Elease Hashimoto at 10:15AM.

## 2019-02-12 NOTE — Telephone Encounter (Signed)
Copied from [place] [id number]. Topic: General - Other >> Feb 12, 2019 10:53 AM Nils Flack wrote: Reason for CRM: pt need letter for being off work for today and tomorrow. Please post to mchart so pt can print.

## 2019-02-12 NOTE — Telephone Encounter (Signed)
Copied from [place] [id number]. Topic: Referral - Request for Referral >> Feb 12, 2019 10:54 AM Nils Flack wrote: Has patient seen PCP for this complaint? yes *If NO, is insurance requiring patient see PCP for this issue before PCP can refer them? Referral for which specialty: pt wants referral/rx for massage chair Preferred provider/office:  Reason for referral: Cb is [phone]

## 2019-02-12 NOTE — Telephone Encounter (Signed)
Patient called again to check on the status of his referral for his massage chair and also checking on the status of his work note.

## 2019-02-13 NOTE — Telephone Encounter (Signed)
Appointment has been scheduled.

## 2019-02-13 NOTE — Telephone Encounter (Signed)
I have not done this before. If he is ordering through a company that says that it can be covered medically they should have a form. We would need to do a "face to face" though as this would be medical equipment. I would suggest he look into getting form from their company and the set up web visit once he is able to get this sent to [place] so we can review need in "visit" and document for coverage?   Check with him; maybe he knows something we don't.

## 2019-02-13 NOTE — Telephone Encounter (Signed)
Please advise. I'm not sure that we are able to order this?

## 2019-02-16 ENCOUNTER — Ambulatory Visit (INDEPENDENT_AMBULATORY_CARE_PROVIDER_SITE_OTHER): Payer: Federal, State, Local not specified - PPO | Admitting: Family Medicine

## 2019-02-16 ENCOUNTER — Encounter: Payer: Self-pay | Admitting: Family Medicine

## 2019-02-16 ENCOUNTER — Other Ambulatory Visit: Payer: Self-pay

## 2019-02-16 DIAGNOSIS — G8929 Other chronic pain: Secondary | ICD-10-CM

## 2019-02-16 DIAGNOSIS — M5441 Lumbago with sciatica, right side: Secondary | ICD-10-CM | POA: Diagnosis not present

## 2019-02-16 NOTE — Telephone Encounter (Signed)
Ok for letter

## 2019-02-16 NOTE — Progress Notes (Signed)
Virtual Visit via Video Note  I connected with@ on 02/16/19 at  8:00 AM EDT by a video enabled telemedicine application and verified that I am speaking with the correct person using two identifiers.  Location patient: home Location provider:work or home office Persons participating in the virtual visit: patient, provider  I discussed the limitations of evaluation and management by telemedicine and the availability of in person appointments. The patient expressed understanding and agreed to proceed.   Michael [name] DOB: 03/05/1956 Encounter date: 02/16/2019  This is a 63 y.o. male who presents to establish care. No chief complaint on file.   History of present illness: Has been working on drinking increase amount of water and maintaining his healthy diet to keep back discomfort down. Had some jelly beans around Easter and felt like this through off his system.   Has had hard time sitting up out of bed. Has to lift boxes for work so has to be at top functioning to be able to do this.   Also been drinking water with lemon juice to help filter system. Worries that he has a bacterial overgrowth in his system. He feels like the longer he has worked at current job the more he has had a tightness in back - hard to tie shoes, bend over, get out of bed and feels this is related to drinking bottled water at work. He has stopped drinking the bottled water and feels like this has helped him. He feels that there is a bacteria in the water jug since he hasn't noted anyone changing the filter or taking care with changing out these water jugs.   He in interested in getting massage chair to help him with his back discomfort. He uses a pull up bar to stretch out back which helps. He exercises and stretches daily. He takes good care of himself and his body. However, in last multiple months, he has had a back/flank discomfort that bothers him (see above). He still maintains activity, but has more discomfort  with changes in position. He was looking into the [employer] - sales rep wrote on brochure that it was FDA approved medical device. He called back sales rep and was put in contact with someone through company. He is waiting on her to get back in touch with him.   He also has been using horseradish (Gold's) which has been seeming to help with "inflammation in bowels". He was considering healing aspects of the root and what he has read about it. Also thinks this might help with cysts. He has had cyst removal from thoracic area near scapula in the past and hopes that the horseradish will help prevent these.  Had lumbar disc surgery in 2014. Pressure in lower lumbar and upper sacral spine. Went for massage but afterwards had worse time getting up off massage table. Pain worsened over time from that standpoint. 3-4 months before operation felt like this continued to get worse. 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 Had to put massage therapy aside due to this and focus on lighter duty work. Massage treatments kept worsening pain.   Pain felt better pretty immediately after surgery. Improved with his exercises and maintaining flexibility and strength within the lumbar spine.   Chair per patient measures area of back. He states that he needs attention to lumbar/sacral area. When he tried this chair he had some good relief in this area of back. Took away  his underlying tension. Feels that without this he just continues to tighten up. Work does aggravate this with lifting.   He does not wear back brace. He does noticed tightness down into right leg. Has difficulty with stretching once his back flares and has harder time improving muscles due to this tension. Affects gluts and hamstring.   Weds is when back was bothering him most. Had been using shovel in garden trying to loosen upper layer of sod. Thinks this flared him up. Has had flares without obvious physical  trigger. Some days just wakes up and has difficulty with tying shoes.    No past medical history on file. Past Surgical History:  Procedure Laterality Date  . [place] SURGERY  2014  . right ankle fracture  1978   external pinning   No Known Allergies Current Meds  Medication Sig  . Blood Pressure Monitoring (ADULT BLOOD PRESSURE CUFF LG) KIT 1 Device by Does not apply route daily.   Social History   Tobacco Use  . Smoking status: Never Smoker  . Smokeless tobacco: Never Used  Substance Use Topics  . Alcohol use: Yes    Comment: rare   Family History  Problem Relation Age of Onset  . High blood pressure Mother      Review of Systems  Constitutional: Negative for chills, fatigue and fever.  Respiratory: Negative for cough, chest tightness, shortness of breath and wheezing.   Cardiovascular: Negative for chest pain, palpitations and leg swelling.  Musculoskeletal: Positive for back pain (lumbar with some tension into buttock/hamstrings).  Neurological: Negative for numbness.    Objective:  There were no vitals taken for this visit.      BP Readings from Last 3 Encounters:  11/21/18 (!) 142/82  10/24/18 138/88  09/24/18 (!) 162/84   Wt Readings from Last 3 Encounters:  11/21/18 216 lb 11.2 oz (98.3 kg)  10/24/18 209 lb 9.6 oz (95.1 kg)  09/24/18 214 lb 6.4 oz (97.3 kg)    EXAM:  GENERAL: alert, oriented, appears well and in no acute distress  HEENT: atraumatic, conjunctiva clear, no obvious abnormalities on inspection of external nose and ears  NECK: normal movements of the head and neck  LUNGS: on inspection no signs of respiratory distress, breathing rate appears normal, no obvious gross SOB, gasping or wheezing  CV: no obvious cyanosis  MS: moves all visible extremities without noticeable abnormality. He is able to get up and down from chair (although today is doing better since he has been moving around and stretching already this morning).    PSYCH/NEURO: pleasant and cooperative, no obvious depression or anxiety, speech and thought processing grossly intact  Assessment/Plan 1. Chronic midline low back pain with right-sided sciatica He is in contact with rep from [place] and will get paperwork to me to look over medical need for chair. I do think that this would be a helpful device for him. He really does everything on his end with back care in terms of maintaining core strength, stretching, etc so this would be an additional modality to help him with maintaining high function.     I discussed the assessment and treatment plan with the patient. The patient was provided an opportunity to ask questions and all were answered. The patient agreed with the plan and demonstrated an understanding of the instructions.   The patient was advised to call back or seek an in-person evaluation if the symptoms worsen or if the condition fails to improve as  anticipated.    Micheline Rough, MD

## 2019-02-16 NOTE — Telephone Encounter (Unsigned)
Copied from [place] [id number]. Topic: General - Other >> Feb 16, 2019  2:17 PM Nils Flack wrote: Reason for CRM: [phone] Pt needs work note for missing work Thurs 16th and Fri 17th . Please post to mychart so pt can print.  Call above number if questions

## 2019-03-06 ENCOUNTER — Encounter: Payer: Self-pay | Admitting: Family Medicine

## 2019-10-28 ENCOUNTER — Telehealth: Payer: Self-pay | Admitting: Family Medicine

## 2019-10-28 NOTE — Telephone Encounter (Signed)
Pt came in and dropped off FMLA form that need to be completed by the provider.  Upon completion pt would like to be called [phone] to pick form up.  Form placed in providers folder for completion.

## 2019-10-29 NOTE — Telephone Encounter (Signed)
Forms placed on Dr Berenice Bouton desk.

## 2019-11-02 NOTE — Telephone Encounter (Signed)
I haven't filled these out for him before. He will need virtual appointment so we can talk through details together and I can complete appropriately.

## 2019-11-02 NOTE — Telephone Encounter (Signed)
I left a detailed message at the pts cell number with the information as below.

## 2019-11-03 NOTE — Telephone Encounter (Signed)
Pt returned vm to schedule virtual with Dr. Ethlyn Gallery. No answer on FC line x3. Please return pt's call to schedule prior to Friday appt.

## 2019-11-04 NOTE — Telephone Encounter (Signed)
Spoke with the pt and asked that he arrive at 3:45pm to discuss issues as below and he agreed.

## 2019-11-04 NOTE — Telephone Encounter (Signed)
If you can switch [place] to the 4:00 appointment and have the physical currently scheduled at 4:00 come at 3:45 I will be able to have time to address both the acute concern and the FMLA. If he stays in the current slot I will not have time as that is a 15 min appointment.

## 2019-11-06 ENCOUNTER — Ambulatory Visit: Payer: Federal, State, Local not specified - PPO | Admitting: Family Medicine

## 2019-11-06 ENCOUNTER — Other Ambulatory Visit (HOSPITAL_COMMUNITY)
Admission: RE | Admit: 2019-11-06 | Discharge: 2019-11-06 | Disposition: A | Discharge status: Home / Self Care | Payer: Federal, State, Local not specified - PPO | Source: Ambulatory Visit | Attending: Family Medicine | Admitting: Family Medicine

## 2019-11-06 ENCOUNTER — Encounter: Payer: Self-pay | Admitting: Family Medicine

## 2019-11-06 ENCOUNTER — Other Ambulatory Visit: Payer: Self-pay | Admitting: Family Medicine

## 2019-11-06 ENCOUNTER — Other Ambulatory Visit: Payer: Self-pay

## 2019-11-06 VITALS — BP 110/70 | HR 48 | Temp 98.0°F | Ht 75.0 in | Wt 210.0 lb

## 2019-11-06 DIAGNOSIS — N401 Enlarged prostate with lower urinary tract symptoms: Secondary | ICD-10-CM | POA: Diagnosis not present

## 2019-11-06 DIAGNOSIS — M545 Low back pain, unspecified: Secondary | ICD-10-CM

## 2019-11-06 DIAGNOSIS — N400 Enlarged prostate without lower urinary tract symptoms: Secondary | ICD-10-CM

## 2019-11-06 DIAGNOSIS — R319 Hematuria, unspecified: Secondary | ICD-10-CM | POA: Diagnosis not present

## 2019-11-06 DIAGNOSIS — R300 Dysuria: Secondary | ICD-10-CM | POA: Insufficient documentation

## 2019-11-06 DIAGNOSIS — E785 Hyperlipidemia, unspecified: Secondary | ICD-10-CM | POA: Diagnosis not present

## 2019-11-06 LAB — POC URINALSYSI DIPSTICK (AUTOMATED)
Bilirubin, UA: NEGATIVE
Blood, UA: NEGATIVE
Glucose, UA: NEGATIVE
Ketones, UA: NEGATIVE
Leukocytes, UA: NEGATIVE
Nitrite, UA: NEGATIVE
Protein, UA: POSITIVE — AB
Spec Grav, UA: 1.02 (ref 1.010–1.025)
Urobilinogen, UA: 0.2 E.U./dL
pH, UA: 7 (ref 5.0–8.0)

## 2019-11-06 NOTE — Patient Instructions (Signed)
We are committed to keeping you informed about the COVID-19 vaccine.  As the vaccine continues to become available for each phase, we will ensure that patients who meet the criteria receive the information they need to access vaccination opportunities. Continue to check your MyChart account and [email or web address] for updates. Please review the Phase 1b information below.  Following Michael [name]'s guidelines for the distribution of COVID-19 vaccines, we are pleased to share our plans to begin offering vaccines to those 62 and older (Phase 1b). Here are details of those plans:  [facility] . Appointments required. . Open to those age 62 or older . Not restricted to [place] or [place] residents . Location: [facility] - [address], [place], [place]  . Offered daily beginning: Saturday, November 07, 2019 . Hours: 8 a.m. to 1 p.m. (10 a.m. to 2 p.m. this Saturday and Sunday, Jan. 9 and 10) . Registration for vaccine clinic appointments is open as of 10 a.m., Friday, January 8 . Please visit [email or web address] to register or call [phone] . There will be no copay required for the vaccine. Insurance information will be requested if available.  [facility] will offer this vaccination clinic through Sunday, January 17, after which we will switch to a larger location to offer public vaccination at a larger scale following state guidelines. We will provide additional information as details become available.   In addition to the clinic above, we are working in partnership with county health agencies in [place], [place], [place] and [place] counties to ensure continuing vaccination availability in alignment with state guidelines in the weeks and months ahead.   Information on phase 1b COVID-19 vaccination clinics being offered by local county health agencies is provided on each county health department's website.  [place]'s phase 1b vaccination guidelines, prioritizing those 62 and over as the next eligible group to receive the COVID-19 vaccine, are detailed at [email or web address].gov.   Additional information: Those 62 and older who register for [facility]'s COVID-19 vaccination clinic at [facility] will be provided with additional information, including the need to be observed for 15 minutes following vaccination for your safety. Our COVID-19 testing clinic will not start at this site until 2 p.m. daily to ensure no people arriving for vaccination intersect with those seeking a COVID-19 test. Our [facility] rooms at the [facility] are clean and safe, and our staff will use all appropriate protective equipment to keep you safe. This vaccine clinic will be located in a completely separate area of this facility than where health care is provided. No interaction will occur between patients or care teams at this site and our vaccination clinic.   As we proceed through phases of the vaccine rollout, we remind everyone to remain vigilant in practicing the 3 W's - wear a mask, wash your hands and wait 6 feet apart from others. These safety practices are based in science and are the best tool we have to reduce the spread of the virus.   For our most current information, please visit [email or web address].  

## 2019-11-06 NOTE — Progress Notes (Signed)
Michael [name] DOB: 11-20-1955 Encounter date: 11/06/2019  This is a 64 y.o. male who presents with Chief Complaint  Patient presents with  . Dysuria    patient complains of blood in urine 2 weeks ago, none since, noticed pain when sneezing also  . discuss FMLA paperwork    History of present illness: One morning when getting ready to go to bathroom saw some blood stains on tissue - tends to wrap penis in tissue so he doesn't dribble in underwear.   Prior to that was having issues like pain in lower back and had to not even sneeze because this would increase pain.   Right now ok because he has been moving around, but sometimes feels like balance is affected - like if he bumps into something then back gets really painful.   Does tend to lift boxes at [employer]. Gets pain in back with this. This is part time job and he likes doing it because it keeps him busy. This is the longest that he has been out of work. 10/14/19 was first day out he has been out since that time.  He does not really want to miss work and does not want to necessarily have continuous FMLA, but wants to make sure that back is where it needs to be before going back and potentially worsening issue.  Comes and goes but never really goes away.   Had bowel concerns and had colonoscopy. States that when back is flaring up stool smells differently and then seems to normalize as back feels better. Back does feel better after he has bowel movements.   Not sure if current discomfort is related to previous back surgery, or if related to digestive system.   No discomfort with urination. Does drink beet juice, pomegranate juice regularly. Has had issues with kidney stones in the past.   Took some magnesium glyconate in evening and felt a little better with this in the morning.   On good day back is just sore on right side. On bad day also on left day.   Has also had blood in ejaculate. No blood since first time he noticed it. No  pressure to urinate, flow is good.     No Known Allergies Current Meds  Medication Sig  . Blood Pressure Monitoring (ADULT BLOOD PRESSURE CUFF LG) KIT 1 Device by Does not apply route daily.    Review of Systems  Constitutional: Negative for chills, fatigue and fever.  Respiratory: Negative for cough, chest tightness, shortness of breath and wheezing.   Cardiovascular: Negative for chest pain, palpitations and leg swelling.  Gastrointestinal: Positive for abdominal pain (sometimes; difficult to explain). Negative for abdominal distention, blood in stool, constipation, diarrhea, nausea and vomiting.  Genitourinary: Positive for hematuria (one episode). Negative for decreased urine volume, difficulty urinating, discharge, dysuria, frequency, genital sores, penile pain, penile swelling, scrotal swelling, testicular pain and urgency. Flank pain: uncertain.  Musculoskeletal: Positive for back pain.    Objective:  BP 110/70 (BP Location: Right Arm, Patient Position: Sitting, Cuff Size: Large)   Pulse (!) 48   Temp 98 F (36.7 C) (Temporal)   Ht 6' 3"  (1.905 m)   Wt 210 lb (95.3 kg)   SpO2 96%   BMI 26.25 kg/m   Weight: 210 lb (95.3 kg)   BP Readings from Last 3 Encounters:  11/06/19 110/70  11/21/18 (!) 142/82  10/24/18 138/88   Wt Readings from Last 3 Encounters:  11/06/19 210 lb (95.3 kg)  11/21/18 216 lb 11.2 oz (98.3 kg)  10/24/18 209 lb 9.6 oz (95.1 kg)    Physical Exam Constitutional:      General: He is not in acute distress.    Appearance: He is well-developed.  Cardiovascular:     Rate and Rhythm: Normal rate and regular rhythm.     Heart sounds: Normal heart sounds. No murmur. No friction rub.  Pulmonary:     Effort: Pulmonary effort is normal. No respiratory distress.     Breath sounds: Normal breath sounds. No wheezing or rales.  Abdominal:     General: Abdomen is flat. Bowel sounds are normal.     Palpations: Abdomen is soft.     Tenderness: There is no  abdominal tenderness. There is right CVA tenderness. There is no left CVA tenderness, guarding or rebound. Negative signs include Murphy's sign and McBurney's sign.     Comments: Mild low to mid CVA tenderness; very subtle on exam  Genitourinary:    Pubic Area: No rash.      Penis: Normal and circumcised.      Testes: Normal.        Right: Mass not present.        Left: Mass not present.     Prostate: Enlarged (boggy).     Rectum: Normal. Guaiac result negative. No tenderness or external hemorrhoid.  Musculoskeletal:     Right lower leg: No edema.     Left lower leg: No edema.  Neurological:     Mental Status: He is alert and oriented to person, place, and time.     Deep Tendon Reflexes:     Reflex Scores:      Patellar reflexes are 2+ on the right side and 2+ on the left side.      Achilles reflexes are 2+ on the right side and 2+ on the left side.    Comments: Does have some pulling in back with right straight leg raise  Psychiatric:        Behavior: Behavior normal.     Assessment/Plan  1. Hematuria, unspecified type Single episode hematuria.  Check urine studies.  UA looks okay except for some protein.  Further evaluation and plan pending other urine testing results. - POCT Urinalysis Dipstick (Automated) - Urine Culture - Urine cytology ancillary only - CBC with Differential/Platelet; Future - Comprehensive metabolic panel; Future  2. Right-sided low back pain without sciatica, unspecified chronicity Previous MRI from January/2020 revealed degenerative changes in the lumbar spine.  I am uncertain if back discomfort is related to bladder or prostate or if it may be coming from more musculoskeletal standpoint.  Further evaluation will be suggested pending lab results.  For the time being, I will complete FMLA paperwork to keep him out of work for the next couple of weeks until we figure out what further treatment or evaluation is needed.  He does seem to have more weakness of  core muscles and difficulty with changes in position on exam table.  If other lab evaluation is negative, I would certainly consider some physical therapy/specialty evaluation.  - Magnesium, RBC; Future - Sedimentation rate; Future - C-reactive protein; Future  3. Hyperlipidemia, unspecified hyperlipidemia type - Lipid panel; Future  4. Enlarged prostate - PSA; Future   Return for pending labs, culture.  Time of appointment, discussion of evaluation, completion of documentation, discussion FMLA needs greater than 40 minutes.   Micheline Rough, MD

## 2019-11-09 ENCOUNTER — Telehealth: Payer: Self-pay | Admitting: *Deleted

## 2019-11-09 DIAGNOSIS — Z202 Contact with and (suspected) exposure to infections with a predominantly sexual mode of transmission: Secondary | ICD-10-CM

## 2019-11-09 NOTE — Telephone Encounter (Signed)
X-ray tech sent a message stating the urine that was left in the refrigerator on Friday was out of date and could not be used.  I called the pt and left a detailed message to apologize for this and asked that he come in anytime today from 8-12 or 1:30pm-4:30pm to leave another urine for testing.

## 2019-11-09 NOTE — Telephone Encounter (Signed)
Message Routed to PCP CMA 

## 2019-11-09 NOTE — Telephone Encounter (Signed)
Pt called in to inform CMA that he is going to provide sample tomorrow at lab appt. Pt would also like to know if PCP can check for parasites as well. Please advise.

## 2019-11-10 ENCOUNTER — Other Ambulatory Visit: Payer: Self-pay

## 2019-11-10 ENCOUNTER — Other Ambulatory Visit (INDEPENDENT_AMBULATORY_CARE_PROVIDER_SITE_OTHER): Payer: Federal, State, Local not specified - PPO

## 2019-11-10 ENCOUNTER — Other Ambulatory Visit: Payer: Self-pay | Admitting: Family Medicine

## 2019-11-10 DIAGNOSIS — N400 Enlarged prostate without lower urinary tract symptoms: Secondary | ICD-10-CM

## 2019-11-10 DIAGNOSIS — R972 Elevated prostate specific antigen [PSA]: Secondary | ICD-10-CM

## 2019-11-10 DIAGNOSIS — R319 Hematuria, unspecified: Secondary | ICD-10-CM

## 2019-11-10 DIAGNOSIS — M545 Low back pain, unspecified: Secondary | ICD-10-CM

## 2019-11-10 DIAGNOSIS — E785 Hyperlipidemia, unspecified: Secondary | ICD-10-CM

## 2019-11-10 LAB — LIPID PANEL
Cholesterol: 187 mg/dL (ref 0–200)
HDL: 55.5 mg/dL (ref >39.00)
LDL Cholesterol: 118 mg/dL — ABNORMAL HIGH (ref 0–99)
NonHDL: 131.3
Total CHOL/HDL Ratio: 3
Triglycerides: 69 mg/dL (ref 0.0–149.0)
VLDL: 13.8 mg/dL (ref 0.0–40.0)

## 2019-11-10 LAB — COMPREHENSIVE METABOLIC PANEL
ALT: 13 U/L (ref 0–53)
AST: 15 U/L (ref 0–37)
Albumin: 3.9 g/dL (ref 3.5–5.2)
Alkaline Phosphatase: 73 U/L (ref 39–117)
BUN: 21 mg/dL (ref 6–23)
CO2: 29 mEq/L (ref 19–32)
Calcium: 9 mg/dL (ref 8.4–10.5)
Chloride: 107 mEq/L (ref 96–112)
Creatinine, Ser: 0.83 mg/dL (ref 0.40–1.50)
GFR: 93.91 mL/min (ref >60.00)
Glucose, Bld: 101 mg/dL — ABNORMAL HIGH (ref 70–99)
Potassium: 4.1 mEq/L (ref 3.5–5.1)
Sodium: 141 mEq/L (ref 135–145)
Total Bilirubin: 0.4 mg/dL (ref 0.2–1.2)
Total Protein: 6.8 g/dL (ref 6.0–8.3)

## 2019-11-10 LAB — CBC WITH DIFFERENTIAL/PLATELET
Basophils Absolute: 0 10*3/uL (ref 0.0–0.1)
Basophils Relative: 0.5 % (ref 0.0–3.0)
Eosinophils Absolute: 0.2 10*3/uL (ref 0.0–0.7)
Eosinophils Relative: 3.6 % (ref 0.0–5.0)
HCT: 45.3 % (ref 39.0–52.0)
Hemoglobin: 14.6 g/dL (ref 13.0–17.0)
Lymphocytes Relative: 40.8 % (ref 12.0–46.0)
Lymphs Abs: 2.7 10*3/uL (ref 0.7–4.0)
MCHC: 32.3 g/dL (ref 30.0–36.0)
MCV: 70.3 fl — ABNORMAL LOW (ref 78.0–100.0)
Monocytes Absolute: 0.7 10*3/uL (ref 0.1–1.0)
Monocytes Relative: 9.9 % (ref 3.0–12.0)
Neutro Abs: 3 10*3/uL (ref 1.4–7.7)
Neutrophils Relative %: 45.2 % (ref 43.0–77.0)
Platelets: 154 10*3/uL (ref 150.0–400.0)
RBC: 6.45 Mil/uL — ABNORMAL HIGH (ref 4.22–5.81)
RDW: 16.6 % — ABNORMAL HIGH (ref 11.5–15.5)
WBC: 6.6 10*3/uL (ref 4.0–10.5)

## 2019-11-10 LAB — SEDIMENTATION RATE: Sed Rate: 5 mm/hr (ref 0–20)

## 2019-11-10 LAB — C-REACTIVE PROTEIN: CRP: <1 mg/dL (ref 0.5–20.0)

## 2019-11-10 LAB — PSA: PSA: 8.2 ng/mL — ABNORMAL HIGH (ref 0.10–4.00)

## 2019-11-10 NOTE — Telephone Encounter (Signed)
Left a message for the pt to return a call to the office.  CRM also created. 

## 2019-11-10 NOTE — Telephone Encounter (Signed)
Let's start with bloodwork and generic urine testing that we are doing. Unless there is something that he hasn't told me about (travel to country with potential parasite exposure, eating raw/not fully cooked meat/fish, etc) there is not a standard parasite test that we typically do. If he has something specific in mind or a potential exposure, please let me know.

## 2019-11-10 NOTE — Telephone Encounter (Signed)
Patient called and was returning [place] call for his lab results. Please call patient back, thanks.

## 2019-11-11 ENCOUNTER — Other Ambulatory Visit (HOSPITAL_COMMUNITY)
Admission: RE | Admit: 2019-11-11 | Discharge: 2019-11-11 | Disposition: A | Discharge status: Home / Self Care | Payer: Federal, State, Local not specified - PPO | Source: Ambulatory Visit | Attending: Family Medicine | Admitting: Family Medicine

## 2019-11-11 ENCOUNTER — Other Ambulatory Visit: Payer: Self-pay | Admitting: Family Medicine

## 2019-11-11 DIAGNOSIS — R319 Hematuria, unspecified: Secondary | ICD-10-CM | POA: Diagnosis not present

## 2019-11-11 LAB — URINE CYTOLOGY ANCILLARY ONLY
Chlamydia: NEGATIVE
Comment: NEGATIVE
Comment: NORMAL
Neisseria Gonorrhea: NEGATIVE

## 2019-11-11 NOTE — Telephone Encounter (Signed)
Patient informed of the message below and also lab results were discussed again from 1/12.

## 2019-11-12 LAB — URINE CULTURE
MICRO NUMBER:: 10032985
Result:: NO GROWTH
SPECIMEN QUALITY:: ADEQUATE

## 2019-11-12 LAB — CYTOLOGY - NON PAP

## 2019-11-13 LAB — MAGNESIUM, RBC: Magnesium RBC: 4.7 mg/dL (ref 4.0–6.4)

## 2019-11-17 ENCOUNTER — Telehealth: Payer: Self-pay | Admitting: *Deleted

## 2019-11-17 NOTE — Telephone Encounter (Signed)
FMLA paperwork faxed to [facility] at the number below.

## 2019-11-17 NOTE — Telephone Encounter (Signed)
Pt is asking for fmla ppw to be faxed to  [employer] Fax [phone]  He needs them faxed no as the company needs them asap

## 2019-11-17 NOTE — Telephone Encounter (Signed)
I called the pt and informed him Dr Ethlyn Gallery completed the [facility] paperwork and this was left at the front desk for pick up.  Copy sent to be scanned.

## 2019-11-17 NOTE — Telephone Encounter (Signed)
Copied from [place] [id number]. Topic: General - Other >> Nov 17, 2019  4:19 PM Nils Flack, Marland Kitchen wrote: Reason for CRM: pt is worried that he may have missed a call from the urology.  Please call pt and give him a status update [phone].  Pt is having symptoms.  Please send mychart message with update please

## 2019-12-11 DIAGNOSIS — C61 Malignant neoplasm of prostate: Secondary | ICD-10-CM | POA: Diagnosis not present

## 2019-12-11 DIAGNOSIS — R310 Gross hematuria: Secondary | ICD-10-CM | POA: Diagnosis not present

## 2019-12-15 DIAGNOSIS — R310 Gross hematuria: Secondary | ICD-10-CM | POA: Diagnosis not present

## 2019-12-29 ENCOUNTER — Telehealth: Payer: Self-pay | Admitting: Family Medicine

## 2019-12-29 NOTE — Telephone Encounter (Signed)
The patient wants Dr. Ethlyn Gallery to write a letter to his job extending his FMLA date to 01/21/2020.   The fax number is:[phone]  He went to [facility] on 12/11/2019  His next appointment with [facility] is on 01/08/2020  He also wanted to let Dr. Ethlyn Gallery know that his PSA was done in the office and it was 8.2 and his PSA at [facility] on 12/11/2019 was 6.3.

## 2019-12-30 NOTE — Telephone Encounter (Signed)
Office notes received via fax and placed on Dr [employer].

## 2019-12-30 NOTE — Telephone Encounter (Signed)
Can you see if we can get urology notes? I haven't seen these.   Thanks for update. I am ok with extending but we need a follow up visit to discuss so I know exactly what we are extending for and whether I need to be concerned with any further evaluation. We extended for back pain initially.

## 2020-01-05 NOTE — Telephone Encounter (Signed)
Urology notes from visit 2/12 were reviewed. Looks like they planned follow up 2/16 which I'm guessing he postponed until 3/12? Since he is stating that is next visit date.   He still needs re-eval to extend note so that I know what we are extending for (back pain?) and can be specific about details.

## 2020-01-05 NOTE — Telephone Encounter (Signed)
Patient informed of the message below.  Patient stated he needs FMLA extended due to the tests he is having per urology and has discomfort in the kidney.  Message sent to PCP as there are no openings until 3/24.

## 2020-01-06 NOTE — Telephone Encounter (Signed)
Left message on machine for patient to return our call 

## 2020-01-06 NOTE — Telephone Encounter (Signed)
If it is for kidney reasons and discomfort related to urology; they can write note for him.

## 2020-01-08 NOTE — Telephone Encounter (Signed)
Left message on machine for patient to return our call 

## 2020-01-11 NOTE — Telephone Encounter (Signed)
Pt returned phone call would like a call back. 

## 2020-01-12 NOTE — Telephone Encounter (Signed)
Pt states that [facility] is needing the FMLA papers that have been partially filled out faxed over to them so they can fill their portion. If the ATTN can be made out to Dr. Ellison Hughs  FAX: [phone]

## 2020-01-12 NOTE — Telephone Encounter (Signed)
Patient is aware 

## 2020-01-12 NOTE — Telephone Encounter (Signed)
I do not have papers for him.   From my recollection; he just wanted "letter extending FMLA" but we didn't get new forms. I completed these forms in January (scanned into our system) but I have not received new forms.   So we can send over what I completed before (but they are not partially completed they were fully completed) but if work is not accepting just a letter to extend, then they will have to get new FMLA forms. I would suspect that urology could just write letter excusing for dates applicable to current appointments/treatments and if they require additional forms they will send.

## 2020-01-12 NOTE — Telephone Encounter (Signed)
Patient returned call I tried to get [name] to take the call before I realized that [name] left the patient the message.  Please contact the patient back.

## 2020-01-12 NOTE — Telephone Encounter (Signed)
Left message on machine for patient to return our call 

## 2020-01-13 NOTE — Telephone Encounter (Signed)
I left a detailed message at the pts cell number with the information below and suggested he contact his employer to have them send paperwork to the urology office.

## 2020-01-18 ENCOUNTER — Telehealth: Payer: Self-pay | Admitting: Family Medicine

## 2020-01-18 NOTE — Telephone Encounter (Signed)
Pt is calling in stating that he would like to have the results from [facility].  Pt is aware that Dr. Ethlyn Gallery is out of the office today and will give information on Wednesday.

## 2020-01-19 NOTE — Telephone Encounter (Signed)
I don't have results from them yet. They are not linked to our system so he should call them and it is likely he will find out before they send them to me. (I believe he is referring to recent procedures he had through their office)

## 2020-01-20 NOTE — Telephone Encounter (Signed)
Spoke with patient and explained the CT results and any treatment should come from the doctor who ordered the CT.  Patient will also have to come by the office and sign a ROI to have a copy his FMLA paperwork faxed to urology.

## 2020-01-20 NOTE — Telephone Encounter (Signed)
I left a detailed message with the information below at the pts cell number. 

## 2020-02-02 ENCOUNTER — Ambulatory Visit: Payer: Federal, State, Local not specified - PPO | Attending: Internal Medicine

## 2020-02-02 DIAGNOSIS — Z23 Encounter for immunization: Secondary | ICD-10-CM

## 2020-02-02 NOTE — Progress Notes (Signed)
   Covid-19 Vaccination Clinic  Name:  Gevork Espinosa    MRN: [id number] DOB: [birth year]  02/02/2020  Mr. Hefty was observed post Covid-19 immunization for 15 minutes without incident. He was provided with Vaccine Information Sheet and instruction to access the V-Safe system.   Mr. Wohlwend was instructed to call 911 with any severe reactions post vaccine: Marland Kitchen Difficulty breathing  . Swelling of face and throat  . A fast heartbeat  . A bad rash all over body  . Dizziness and weakness   Immunizations Administered    Name Date Dose VIS Date Route   Pfizer COVID-19 Vaccine 02/02/2020 11:05 AM 0.3 mL 10/09/2019 Intramuscular   Manufacturer: [employer], [employer]   Lot: [id number]   [place]: [id number]

## 2020-02-05 ENCOUNTER — Telehealth: Payer: Self-pay | Admitting: Family Medicine

## 2020-02-05 NOTE — Telephone Encounter (Signed)
Pt would like a call back about a letter he received from his insurance company about treatment.

## 2020-02-08 NOTE — Telephone Encounter (Signed)
Left a message for the pt to call back with a detailed message as to what the letter was referring to and to see if we can help or if he may need to call the billing office.

## 2020-02-23 ENCOUNTER — Ambulatory Visit: Payer: Federal, State, Local not specified - PPO | Attending: Internal Medicine

## 2020-02-23 DIAGNOSIS — Z23 Encounter for immunization: Secondary | ICD-10-CM

## 2020-02-23 NOTE — Progress Notes (Signed)
   Covid-19 Vaccination Clinic  Name:  Michael [name]    MRN: [id number] DOB: 23-Dec-1955  02/23/2020  [name] was observed post Covid-19 immunization for 15 minutes without incident. He was provided with Vaccine Information Sheet and instruction to access the V-Safe system.   [name] was instructed to call 911 with any severe reactions post vaccine: Marland Kitchen Difficulty breathing  . Swelling of face and throat  . A fast heartbeat  . A bad rash all over body  . Dizziness and weakness   Immunizations Administered    Name Date Dose VIS Date Route   Pfizer COVID-19 Vaccine 02/23/2020 11:30 AM 0.3 mL 12/23/2018 Intramuscular   Manufacturer: [place]   Lot: [id number]   [place]: [id number]

## 2020-05-06 ENCOUNTER — Ambulatory Visit: Payer: Federal, State, Local not specified - PPO | Admitting: Family Medicine

## 2020-05-06 ENCOUNTER — Other Ambulatory Visit: Payer: Self-pay

## 2020-05-06 ENCOUNTER — Encounter: Payer: Self-pay | Admitting: Family Medicine

## 2020-05-06 VITALS — BP 128/80 | HR 52 | Temp 98.7°F | Ht 75.0 in | Wt 219.1 lb

## 2020-05-06 DIAGNOSIS — M545 Low back pain, unspecified: Secondary | ICD-10-CM

## 2020-05-06 NOTE — Progress Notes (Signed)
Michael [name] DOB: [birth year] Encounter date: 05/06/2020  This is a 64 y.o. male who presents with Chief Complaint  Patient presents with  . Follow-up    History of present illness: After seeing urology for hematuria - was told that this would "go away". Still has some pain - esp with coughing - states pain is "under diaphragm" and "not one sided". If he does something like cutting yard he can feel it in back. Notes more discomfort when he is reclined. Cough while laying down makes pain worse. Has been bothering him since last year. Earlier in year he couldn't even bend over unless bracing himself, so it has improved, but he has also been very cautious.   Did not return to work since December. Tries to get out in yard and not overdo things but stay active. Does usually have more discomfort after he has been physically active during the day. Feels most of this in lumbar area. Esp notes if bending over to pick something up. Does things more gingerly than he did before. Very mindful of back. Previously back wouldn't pop because it was so tight, locked up. Now can feel it release if doing gentle twisting.   He is concerned about "cyst" that was seen on imaging (he is referring to cyst that he states was near the kidney and seen on imaging that was ordered by urology; we do not have this report or urology office notes from after imaging was completed).  He feels that cyst that this imaging is contributing to current lumbar back discomfort.  He wonders what he can do to help this cyst drain or resolve.  He is wondering what sorts of natural remedies might help his body can get rid of the cyst.  His understanding of cyst is that they are essentially waste receptacles for products that the body does not want or need.   No Known Allergies No outpatient medications have been marked as taking for the 05/06/20 encounter (Office Visit) with [name], MD.    Review of Systems  Constitutional:  Negative for chills, fatigue and fever.  Respiratory: Negative for cough, chest tightness, shortness of breath and wheezing.   Cardiovascular: Negative for chest pain, palpitations and leg swelling.  Genitourinary: Positive for hematuria (has had episodes). Negative for difficulty urinating, flank pain and frequency.  Musculoskeletal: Positive for back pain.    Objective:  BP 128/80 (BP Location: Right Arm, Patient Position: Sitting, Cuff Size: Large)   Pulse (!) 52   Temp 98.7 F (37.1 C) (Temporal)   Ht 6\' 3"  (1.905 m)   Wt 219 lb 1.6 oz (99.4 kg)   BMI 27.39 kg/m   Weight: 219 lb 1.6 oz (99.4 kg)   BP Readings from Last 3 Encounters:  05/06/20 128/80  11/06/19 110/70  11/21/18 (!) 142/82   Wt Readings from Last 3 Encounters:  05/06/20 219 lb 1.6 oz (99.4 kg)  11/06/19 210 lb (95.3 kg)  11/21/18 216 lb 11.2 oz (98.3 kg)    Physical Exam Constitutional:      General: He is not in acute distress.    Appearance: He is well-developed.  Cardiovascular:     Rate and Rhythm: Normal rate and regular rhythm.     Heart sounds: Normal heart sounds. No murmur heard.  No friction rub.  Pulmonary:     Effort: Pulmonary effort is normal. No respiratory distress.     Breath sounds: Normal breath sounds. No wheezing or rales.  Musculoskeletal:  Right lower leg: No edema.     Left lower leg: No edema.     Comments: There is tenderness and some mild spasm para lumbar spine (L4) level; just superior to prior surgery.  Full ROM Of back without significant discomfort  Neurological:     Mental Status: He is alert and oriented to person, place, and time.     Deep Tendon Reflexes:     Reflex Scores:      Patellar reflexes are 2+ on the right side and 2+ on the left side.      Achilles reflexes are 2+ on the right side and 2+ on the left side.    Comments: Negative straight leg raise  Psychiatric:        Behavior: Behavior normal.     Assessment/Plan  1. Lumbar back  pain Improved, but still present. We reviewed MRI lumbar spine - does not appear to be anything that would correct surgically and he is willing to try PT. I think this is reasonable and if not noting improvement would consider sports med referral. He is interested in getting disability, since he has been unable to return to work. I do feel that he would need to be evaluated by specialist if he wishes to pursue this route for more formal evaluation.   I did have him sign record release from urology today.  I let him know that I will get back in touch with him once I see these imaging reports.  I am not certain that this cyst is related to ongoing back discomfort.  We discussed that it is common to find cysts in the body and that they can stay there for years without changing size or causing symptoms. - Ambulatory referral to Physical Therapy    Return for pending PT results/CT review.    Micheline Rough, MD

## 2020-05-10 IMAGING — MR MR LUMBAR SPINE W/O CM
4 of 5 series · 18 of 48 positions shown · non-contrast
Comparison: 09/30/2010 lumbar spine MRI

CLINICAL DATA: 62 y/o M; nonradiating centralized lower back pain
without injury.

EXAM:
MRI LUMBAR SPINE WITHOUT CONTRAST
TECHNIQUE: Multiplanar, multisequence MR imaging of the lumbar spine was
performed. No intravenous contrast was administered.

[Series 6: T2 · sagittal · 4.0mm · 0.73mm/px · 6 of 17 slices shown (1 of 2)]
[im 1/17]
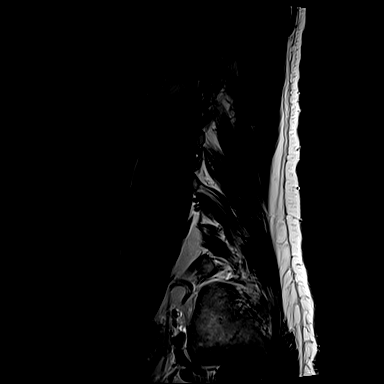
[im 4/17]
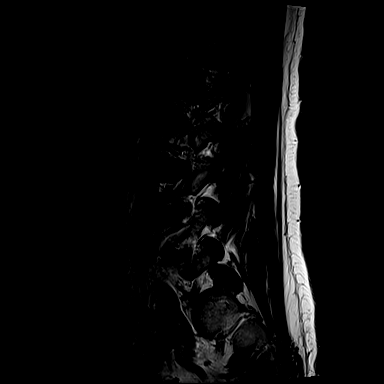
[im 7/17]
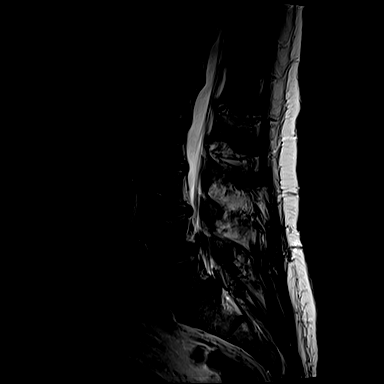
[im 10/17]
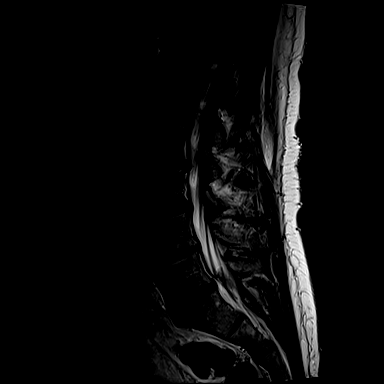
[im 13/17]
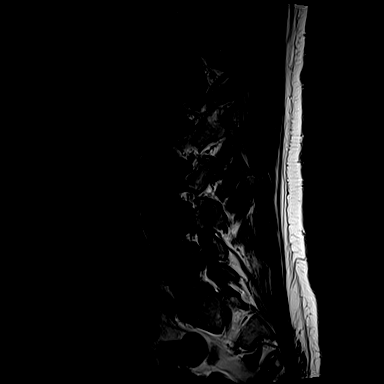
[im 17/17]
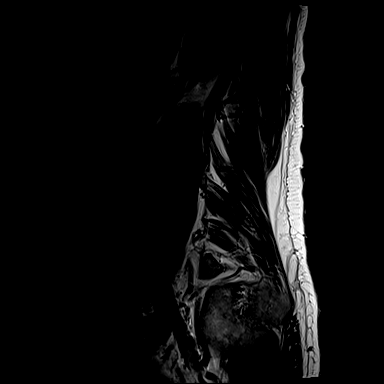

[Series 7: T1 · sagittal · 4.0mm · 0.73mm/px · 3 of 17 slices shown (1 of 2)]
[im 4/17]
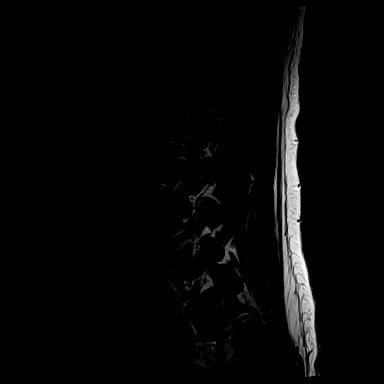
[im 10/17]
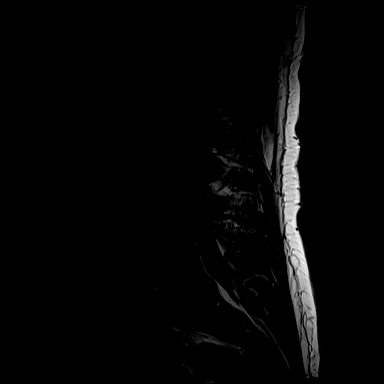
[im 17/17]
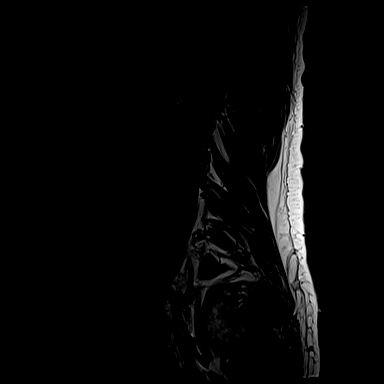

[Series 11: T1 · axial · 4.0mm · 0.28mm/px · z∈[-82,+71]mm · 3 of 39 slices shown (2 of 2)]
[im 6/39]
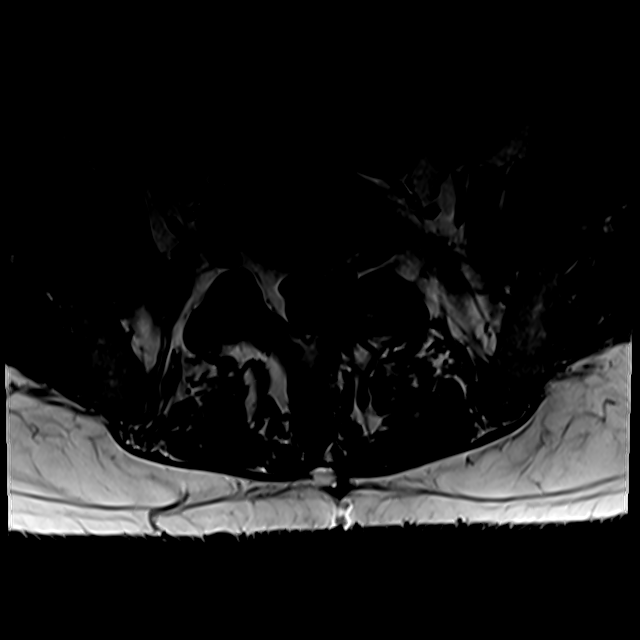
[im 20/39]
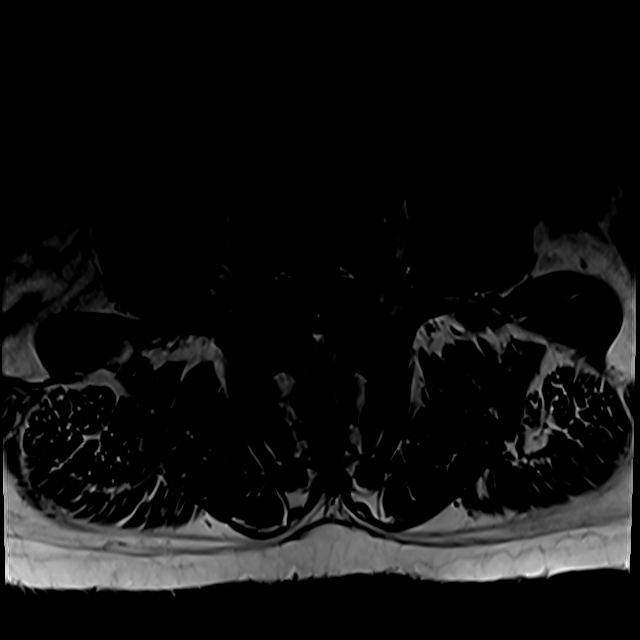
[im 33/39]
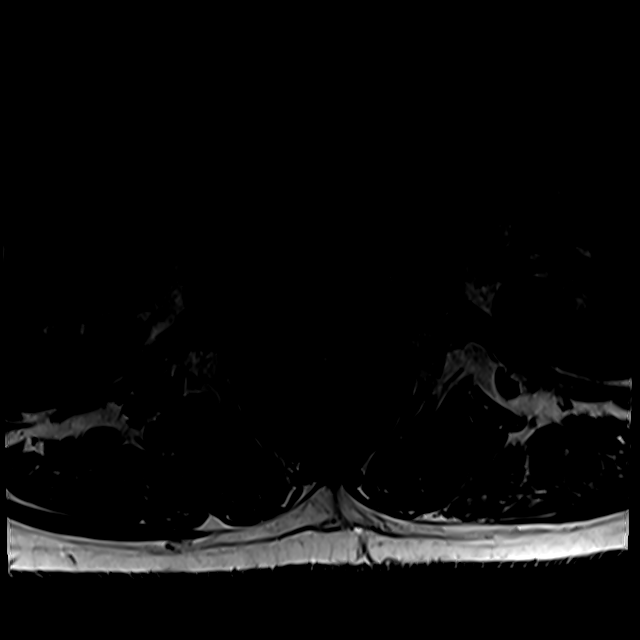

[Series 14: T2 · axial · 4.0mm · 0.28mm/px · z∈[-106,+71]mm · 6 of 39 slices shown (2 of 2)]
[im 1/39]
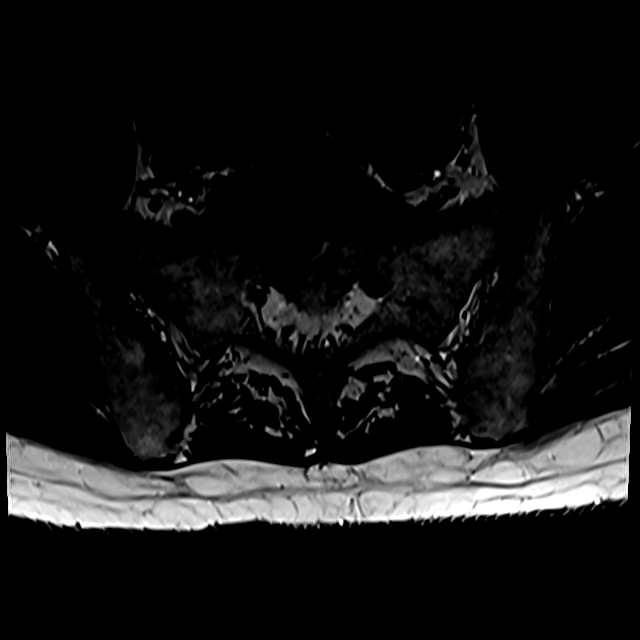
[im 6/39]
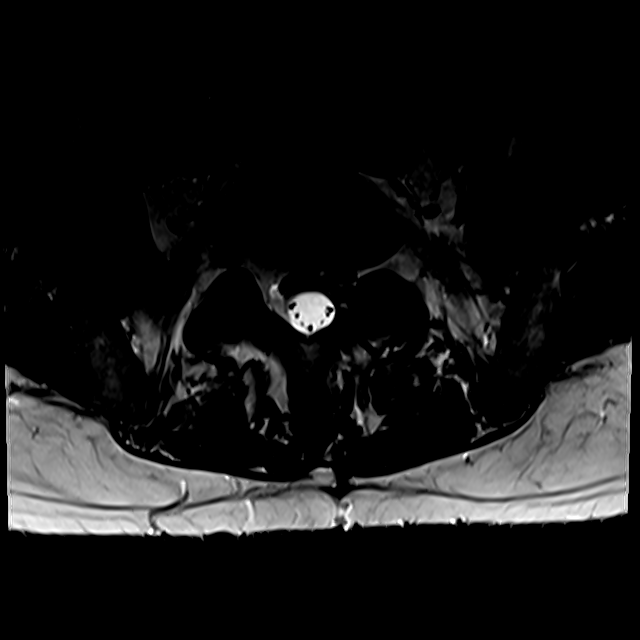
[im 11/39]
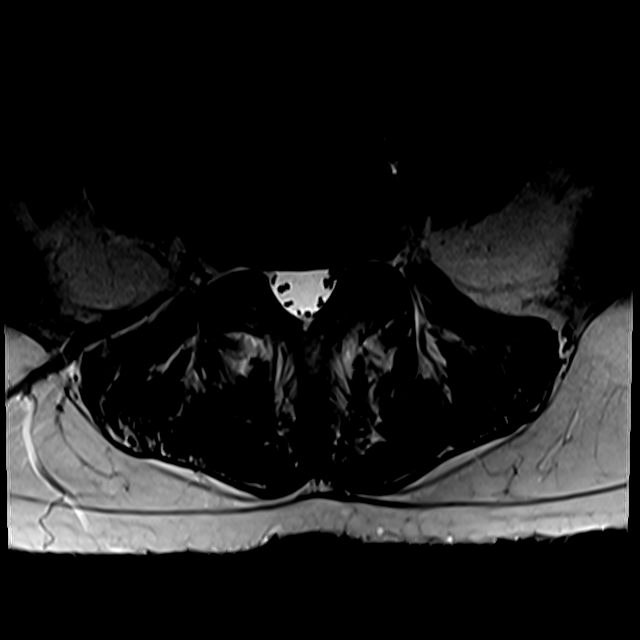
[im 17/39]
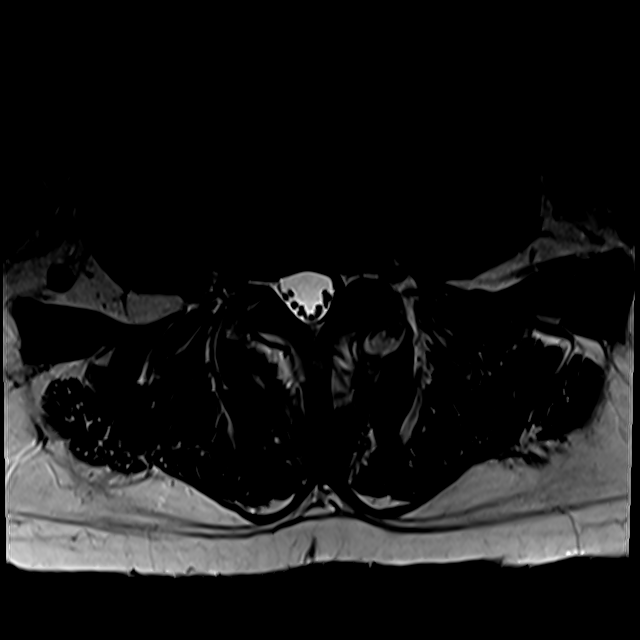
[im 20/39]
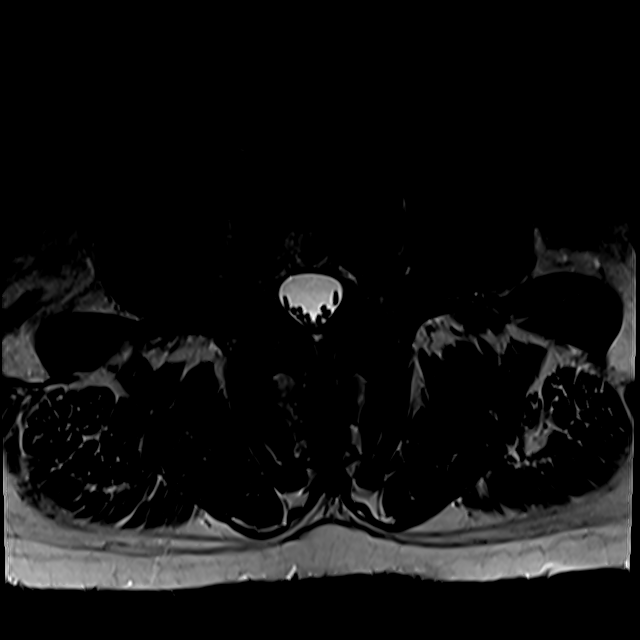
[im 33/39]
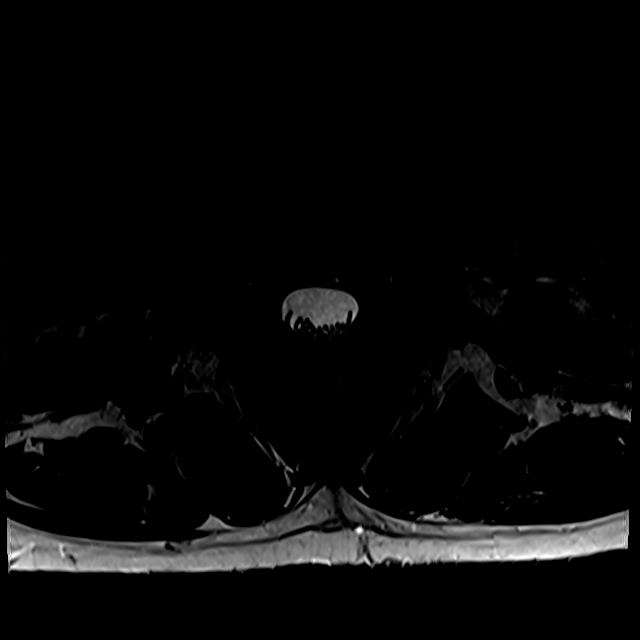

[18 of 48 positions shown; findings below may reference images not displayed]

FINDINGS: Segmentation:  Standard.

Alignment:  Physiologic.

Vertebrae: No fracture, evidence of discitis, or bone lesion. T12-L1
and L3-4 opposing endplate edema, likely degenerative. Left L5-S1
hemilaminectomy chronic postsurgical changes with scar/granulation
tissue in the laminectomy bed extending to the left lateral epidural
space.

Conus medullaris and cauda equina: Conus extends to the T12-L1
level. Conus and cauda equina appear normal.

Paraspinal and other soft tissues: Negative.

Disc levels: Interval increased multilevel disc desiccation with
mild loss of intervertebral disc space height.

T12-L1: Mild disc bulge. No significant foraminal or canal stenosis.

L1-2:  Mild disc bulge.  No significant foraminal or canal stenosis.

L2-3: Mild disc bulge with endplate marginal osteophytes and facet
hypertrophy. Mild bilateral foraminal stenosis. No canal stenosis.

L3-4: Mild disc bulge with endplate marginal osteophytes and facet
hypertrophy. Mild bilateral foraminal stenosis. No significant canal
stenosis.

L4-5: Mild disc bulge with endplate marginal osteophytes and facet
hypertrophy. Mild bilateral foraminal stenosis. No significant canal
stenosis.

L5-S1: Mild disc bulge eccentric to the left with small annular
fissure. Bilateral facet hypertrophy. Left hemilaminectomy
postsurgical changes. Mild left foraminal and lateral recess
stenosis. No canal stenosis.
IMPRESSION: 1. T12-L1 and L3-4 opposing endplate edema, likely degenerative.
2. Interval left L5-S1 hemilaminectomy with chronic postsurgical
changes as above.
3. Mildly increased multilevel discogenic degenerative changes.
Stable multilevel mild neural foraminal stenosis. No significant
canal stenosis.

## 2020-06-17 ENCOUNTER — Other Ambulatory Visit: Payer: Self-pay

## 2020-06-17 ENCOUNTER — Ambulatory Visit: Payer: Federal, State, Local not specified - PPO | Attending: Family Medicine | Admitting: Physical Therapy

## 2020-06-17 DIAGNOSIS — M545 Low back pain, unspecified: Secondary | ICD-10-CM

## 2020-06-17 DIAGNOSIS — R293 Abnormal posture: Secondary | ICD-10-CM | POA: Insufficient documentation

## 2020-06-17 DIAGNOSIS — M6281 Muscle weakness (generalized): Secondary | ICD-10-CM | POA: Diagnosis not present

## 2020-06-17 DIAGNOSIS — M62830 Muscle spasm of back: Secondary | ICD-10-CM | POA: Diagnosis not present

## 2020-06-17 DIAGNOSIS — G8929 Other chronic pain: Secondary | ICD-10-CM | POA: Insufficient documentation

## 2020-06-17 NOTE — Therapy (Signed)
[facility] [address], [place], [place], [address] Phone: [phone]   Fax:  [phone]  Physical Therapy Evaluation  Patient Details  Name: Michael [name] MRN: [id number] Date of Birth: [birth year] Referring Provider (PT): [name], MD   Encounter Date: 06/17/2020   PT End of Session - 06/17/20 0955    Visit Number 1    Date for PT Re-Evaluation 08/12/20    Authorization Type BCBS    PT Start Time [phone]   arrived late (lost)   PT Stop Time 0846    PT Time Calculation (min) 30 min    Activity Tolerance Patient tolerated treatment well    Behavior During Therapy [facility] for tasks assessed/performed           No past medical history on file.  Past Surgical History:  Procedure Laterality Date  . [place] SURGERY  2014  . right ankle fracture  1978   external pinning    There were no vitals filed for this visit.    Subjective Assessment - 06/17/20 0818    Subjective Pt states Lumbar disc are degenerating according to the doctor.  Pt states he is leaning to the Rt and feels like gravity is "pulling me down".  Pt had CT scan that shows a cyst near low thoracic area and feels swelling there sometimes.  Pt hasn't been able to work since Dec last year and cannot do any lifting or back will be very painful the next day.  Sneezing and couging also causes excruciating pain.    Pertinent History L5/S1 disc surgery 2014    Diagnostic tests CT scan Jan 2020    Currently in Pain? Yes    Pain Score 6    shoots ups to 10   Pain Location Back    Pain Orientation Right;Mid    Pain Descriptors / Indicators Sharp;Constant;Nagging    Pain Type Chronic pain    Pain Onset More than a month ago    Pain Frequency Intermittent    Aggravating Factors  leaning to the left, dressing, bending, lifting, coughing and sneezing    Pain Relieving Factors sleep on right side, lying on back with legs,    Effect of Pain on  Daily Activities bending and tying shoes    Multiple Pain Sites No              [facility] PT Assessment - 06/18/20 0001      Assessment   Medical Diagnosis M54.5 (ICD-10-CM) - Lumbar back pain    Referring Provider (PT) [name], MD    Prior Therapy No      Precautions   Precautions None      Restrictions   Weight Bearing Restrictions No      Balance Screen   Has the patient fallen in the past 6 months No      [place] residence    Living Arrangements Alone      Prior Function   Level of Independence Independent      Cognition   Overall Cognitive Status Within Functional Limits for tasks assessed      Observation/Other Assessments   Observations standing and sitting with side bend to the Rt    Focus on Therapeutic Outcomes (FOTO)  54% limited      Posture/Postural Control   Posture/Postural Control Postural limitations    Postural Limitations Rounded Shoulders;Increased thoracic kyphosis;Weight shift right  ROM / Strength   AROM / PROM / Strength AROM;PROM;Strength      AROM   AROM Assessment Site Lumbar    Lumbar Flexion to 1" past knees    Lumbar - Right Side Bend 2" above knee    Lumbar - Left Side Bend 2" above knee    Lumbar - Right Rotation +pain T10/11    Lumbar - Left Rotation +pain T10/11      Strength   Overall Strength Comments Rt hip flex/abduction 4/5 +pain; Lt hip 4+/5; hip ext +pain Rt and 4/5      Palpation   Palpation comment soft tissue adhesions on Rt T10-11 paraspinals and TTP      Ambulation/Gait   Gait Pattern Within Functional Limits                      Objective measurements completed on examination: See above findings.                 PT Short Term Goals - 06/17/20 1025      PT SHORT TERM GOAL #1   Title Pt will be ind with initial HEP    Time 4    Period Weeks    Status New    Target Date 07/15/20             PT Long Term Goals - 06/17/20  1026      PT LONG TERM GOAL #1   Title Pt will be able to bend down to pick up pen or tie shoes with pain <6/10    Baseline >6/10 up to 10/10 when bending down below his knees    Time 8    Period Weeks    Status New    Target Date 08/12/20      PT LONG TERM GOAL #2   Title Pt will be able to demonstrate improved upright posture without feeling he is being pulled towards the Rt due to improved soft tissue mobility    Time 8    Period Weeks    Status New    Target Date 08/12/20      PT LONG TERM GOAL #3   Title Pt will report at least 50% less pain overall during normal daily activities    Time 8    Period Weeks    Status New    Target Date 08/12/20      PT LONG TERM GOAL #4   Title FOTO < or = to 42% limited    Time 8    Period Weeks    Status New    Target Date 08/12/20                  Plan - 06/17/20 1007    Clinical Impression Statement Pt is very friendly and talkative 64 yo male with chronic mid back pain.  Pt has history of disc surgery in low back and findings of cyst in low thoracic region.  Currently patient has been out of work since Dec last year due to debilitating pain coming from low thoracic region with bending and rotation.  Pt has palpable soft tissue adhesion at T10-11 thoracic paraspinal.  He reports it felt TTP but seemed to feel better with myofascial stretch placed on that tissue.  Pt sits and stand with side bend to the Rt and Rt shoulder lower than left.  He has pain and limited ROM with fwd flex, side bending and rotation bilat.  Pt  has pain with MMT of trunk rotation.  He has weakness of hip flexion and abduction Rt >Lt. Pt has pain with hip extension Rt side due to compensates with spinal erectors.  Overall he seems to have fascial adhesions that may be causing some nerve impingments and decreased mobility of the trunk.  He demosntrates some movement compensations due to core weakness that may be from this loss of movement and decreased activity.   Pt seems to have a good knowledge and awareness of the causes of his condition.  He will benefit from skilled PT to address impairments and improve function so he can return to normal activities.    Personal Factors and Comorbidities Comorbidity 2    Comorbidities thoracic cyst; history of lumbar disc surgery    Examination-Activity Limitations Dressing;Transfers;Bend;Lift;Other   couging and sneezing   Examination-Participation Restrictions Cleaning;Yard Work    [employer] Evolving/Moderate complexity    Clinical Decision Making Moderate    Rehab Potential Good    PT Frequency 1x / week   1-2x   PT Duration 8 weeks    PT Treatment/Interventions ADLs/Self Care Home Management;Biofeedback;Cryotherapy;Electrical Stimulation;Neuromuscular re-education;Therapeutic exercise;Therapeutic activities;Patient/family education;Manual techniques;Dry needling;Passive range of motion;Taping;Moist Heat    PT Next Visit Plan lumbar and thoracic mobilty; assess hip and hamstring flexibility due to ran out of time at eval and give stretches as needed; STM and MFR to thoracic and lumbar paraspinals    PT Home Exercise Plan issue next    Consulted and Agree with Plan of Care Patient           Patient will benefit from skilled therapeutic intervention in order to improve the following deficits and impairments:  Pain, Postural dysfunction, Decreased strength, Increased muscle spasms, Increased fascial restricitons, Decreased range of motion  Visit Diagnosis: Chronic right-sided low back pain without sciatica  Muscle weakness (generalized)  Abnormal posture  Muscle spasm of back     Problem List Patient Active Problem List   Diagnosis Date Noted  . Preventive measure 12/18/2012  . Stress and adjustment reaction 12/18/2012    Michael [name], PT 06/18/2020, 5:10 PM  [facility] [address] [place],  [place], [address] Phone: [phone]   Fax:  [phone]  Name: Michael [name] MRN: [id number] Date of Birth: [birth year]

## 2020-06-22 ENCOUNTER — Encounter: Payer: Self-pay | Admitting: Physical Therapy

## 2020-06-22 ENCOUNTER — Ambulatory Visit: Payer: Federal, State, Local not specified - PPO | Admitting: Physical Therapy

## 2020-06-22 ENCOUNTER — Other Ambulatory Visit: Payer: Self-pay

## 2020-06-22 DIAGNOSIS — R293 Abnormal posture: Secondary | ICD-10-CM | POA: Diagnosis not present

## 2020-06-22 DIAGNOSIS — G8929 Other chronic pain: Secondary | ICD-10-CM

## 2020-06-22 DIAGNOSIS — M6281 Muscle weakness (generalized): Secondary | ICD-10-CM | POA: Diagnosis not present

## 2020-06-22 DIAGNOSIS — M62830 Muscle spasm of back: Secondary | ICD-10-CM

## 2020-06-22 DIAGNOSIS — M545 Low back pain, unspecified: Secondary | ICD-10-CM

## 2020-06-22 NOTE — Therapy (Signed)
[facility] [address] [place], [place], [address] Phone: [phone]   Fax:  [phone]  Physical Therapy Treatment  Patient Details  Name: Michael [name] MRN: [id number] Date of Birth: [birth year] Referring Provider (PT): [name], MD   Encounter Date: 06/22/2020   PT End of Session - 06/22/20 0803    Visit Number 2    Date for PT Re-Evaluation 08/12/20    Authorization Type BCBS    PT Start Time 0802    PT Stop Time 0845    PT Time Calculation (min) 43 min    Activity Tolerance Patient tolerated treatment well    Behavior During Therapy [facility] for tasks assessed/performed           History reviewed. No pertinent past medical history.  Past Surgical History:  Procedure Laterality Date  . [place] SURGERY  2014  . right ankle fracture  1978   external pinning    There were no vitals filed for this visit.   Subjective Assessment - 06/22/20 0805    Subjective I feel hopeful after the eval. I mowed the grass yesterday and it made my back and legs act up.    Pertinent History L5/S1 disc surgery 2014    Currently in Pain? Yes    Pain Score 6     Pain Location Back    Pain Orientation Lower    Pain Descriptors / Indicators Sore;Constant    Multiple Pain Sites No                             [facility] Adult PT Treatment/Exercise - 06/22/20 0001      Self-Care   Self-Care Other Self-Care Comments   Concurrent with manual work   Other Self-Care Comments  Sleeping with pillows b/t knees, how to hip hinge vs bending and rounding his back      Lumbar Exercises: Stretches   Active Hamstring Stretch Right;Left;2 reps;30 seconds    Active Hamstring Stretch Limitations added to HEp    Single Knee to Chest Stretch Right;Left;1 rep;60 seconds    Single Knee to Chest Stretch Limitations Added to HEP    Piriformis Stretch Right;Left;2 reps;20 seconds    Piriformis Stretch  Limitations Added to HEP      Manual Therapy   Soft tissue mobilization RT sidelying: lumbar and thoracic                   PT Education - 06/22/20 0820    Education Details HEP    Person(s) Educated Patient    Methods Explanation;Demonstration;Tactile cues;Verbal cues;Handout    Comprehension Returned demonstration;Verbalized understanding            PT Short Term Goals - 06/17/20 1025      PT SHORT TERM GOAL #1   Title Pt will be ind with initial HEP    Time 4    Period Weeks    Status New    Target Date 07/15/20             PT Long Term Goals - 06/17/20 1026      PT LONG TERM GOAL #1   Title Pt will be able to bend down to pick up pen or tie shoes with pain <6/10    Baseline >6/10 up to 10/10 when bending down below his knees    Time 8    Period Weeks  Status New    Target Date 08/12/20      PT LONG TERM GOAL #2   Title Pt will be able to demonstrate improved upright posture without feeling he is being pulled towards the Rt due to improved soft tissue mobility    Time 8    Period Weeks    Status New    Target Date 08/12/20      PT LONG TERM GOAL #3   Title Pt will report at least 50% less pain overall during normal daily activities    Time 8    Period Weeks    Status New    Target Date 08/12/20      PT LONG TERM GOAL #4   Title FOTO < or = to 42% limited    Time 8    Period Weeks    Status New    Target Date 08/12/20                 Plan - 06/22/20 0803    Clinical Impression Statement PTA initiated HEp today. Pt does present with more neutral standing posture today, less RT listing. Pt was able to perform stretches wihtout increasing any symptoms. Pt was instructed in improved sleeping and bending techniques that are more lumbar supportive. Pt understood the principles taught. Pain was less, 4/10, at end of session. Pt's soft tissue mobility was remarkable today.    Personal Factors and Comorbidities Comorbidity 2     Comorbidities thoracic cyst; history of lumbar disc surgery    Examination-Activity Limitations Dressing;Transfers;Bend;Lift;Other    Examination-Participation Restrictions Cleaning;Yard Work    [employer] officer Evolving/Moderate complexity    Rehab Potential Good    PT Frequency 1x / week    PT Duration 8 weeks    PT Treatment/Interventions ADLs/Self Care Home Management;Biofeedback;Cryotherapy;Electrical Stimulation;Neuromuscular re-education;Therapeutic exercise;Therapeutic activities;Patient/family education;Manual techniques;Dry needling;Passive range of motion;Taping;Moist Heat    PT Next Visit Plan Review stretches, look at hip flexors ( ran out out time to look at ).    PT Home Exercise Plan Access Code: [email or web address]    Consulted and Agree with Plan of Care Patient           Patient will benefit from skilled therapeutic intervention in order to improve the following deficits and impairments:  Pain, Postural dysfunction, Decreased strength, Increased muscle spasms, Increased fascial restricitons, Decreased range of motion  Visit Diagnosis: Chronic right-sided low back pain without sciatica  Muscle weakness (generalized)  Abnormal posture  Muscle spasm of back     Problem List Patient Active Problem List   Diagnosis Date Noted  . Preventive measure 12/18/2012  . Stress and adjustment reaction 12/18/2012    Nikita Humble, PTA 06/22/2020, 9:41 AM  [facility] [address], [place], [place], [address] Phone: [phone]   Fax:  [phone]  Name: Ronel Rodeheaver MRN: [id number] Date of Birth: [birth year]

## 2020-06-28 ENCOUNTER — Other Ambulatory Visit: Payer: Self-pay

## 2020-06-28 ENCOUNTER — Ambulatory Visit: Payer: Federal, State, Local not specified - PPO | Admitting: Physical Therapy

## 2020-06-28 DIAGNOSIS — M62830 Muscle spasm of back: Secondary | ICD-10-CM

## 2020-06-28 DIAGNOSIS — M545 Low back pain, unspecified: Secondary | ICD-10-CM

## 2020-06-28 DIAGNOSIS — M6281 Muscle weakness (generalized): Secondary | ICD-10-CM | POA: Diagnosis not present

## 2020-06-28 DIAGNOSIS — R293 Abnormal posture: Secondary | ICD-10-CM

## 2020-06-28 DIAGNOSIS — G8929 Other chronic pain: Secondary | ICD-10-CM | POA: Diagnosis not present

## 2020-06-28 NOTE — Therapy (Signed)
[name] [address], [place], [place], [address] Phone: [phone]   Fax:  [phone]  Physical Therapy Treatment  Patient Details  Name: Michael [name] MRN: [id number] Date of Birth: Aug 02, 1956 Referring Provider (PT): [name], MD   Encounter Date: 06/28/2020   PT End of Session - 06/28/20 1409    Visit Number 3    Date for PT Re-Evaluation 08/12/20    Authorization Type BCBS    PT Start Time 0803    PT Stop Time 0845    PT Time Calculation (min) 42 min    Activity Tolerance Patient tolerated treatment well    Behavior During Therapy [facility] for tasks assessed/performed           No past medical history on file.  Past Surgical History:  Procedure Laterality Date  . [place] SURGERY  2014  . right ankle fracture  1978   external pinning    There were no vitals filed for this visit.   Subjective Assessment - 06/28/20 0811    Subjective I just feel tightness in that area, coughing and sneezing are still painful. No reports of pain currently.  I have been doing the exercises    Currently in Pain? No/denies                             [facility] Adult PT Treatment/Exercise - 06/28/20 0001      Lumbar Exercises: Stretches   Other Lumbar Stretch Exercise hip flexor stretch      Lumbar Exercises: Aerobic   Stationary Bike L1 x 7 min      Lumbar Exercises: Seated   Other Seated Lumbar Exercises side bending - 2 x 20 sec      Manual Therapy   Soft tissue mobilization lumbar and thoracic paraspinlas; diaphragm and hip flexor, intercostals            Trigger Point Dry Needling - 06/28/20 0001    Consent Given? Yes    Education Handout Provided Yes    Muscles Treated Back/Hip Thoracic multifidi;Lumbar multifidi    Lumbar multifidi Response Twitch response elicited;Palpable increased muscle length    Thoracic multifidi response Twitch response elicited;Palpable  increased muscle length                PT Education - 06/28/20 1410    Education Details DN info and added hipflexor stretch    Person(s) Educated Patient    Methods Explanation;Demonstration;Tactile cues;Verbal cues    Comprehension Verbalized understanding;Returned demonstration            PT Short Term Goals - 06/28/20 0835      PT SHORT TERM GOAL #1   Title Pt will be ind with initial HEP    Status Achieved             PT Long Term Goals - 06/17/20 1026      PT LONG TERM GOAL #1   Title Pt will be able to bend down to pick up pen or tie shoes with pain <6/10    Baseline >6/10 up to 10/10 when bending down below his knees    Time 8    Period Weeks    Status New    Target Date 08/12/20      PT LONG TERM GOAL #2   Title Pt will be able to demonstrate improved upright posture without feeling he is being pulled  towards the Rt due to improved soft tissue mobility    Time 8    Period Weeks    Status New    Target Date 08/12/20      PT LONG TERM GOAL #3   Title Pt will report at least 50% less pain overall during normal daily activities    Time 8    Period Weeks    Status New    Target Date 08/12/20      PT LONG TERM GOAL #4   Title FOTO < or = to 42% limited    Time 8    Period Weeks    Status New    Target Date 08/12/20                 Plan - 06/28/20 1514    Clinical Impression Statement PT focused on soft tissue elongation.  He has increased ribcage collapse on the Rt side.  Education in [place] for maintaining increased soft tissue length that was achieved during today's session.  He also has tension in bilat hip flexors Lt>Rt.  Pt had good release of multifidi from T10-L4/5.  Pt will benefit from skilled PT to progress adding core strengthening exercises for improved maintenance of posture.    PT Treatment/Interventions ADLs/Self Care Home Management;Biofeedback;Cryotherapy;Electrical Stimulation;Neuromuscular re-education;Therapeutic  exercise;Therapeutic activities;Patient/family education;Manual techniques;Dry needling;Passive range of motion;Taping;Moist Heat    PT Next Visit Plan review hip flexor stretch and side bending, f/u on dry needling #1, add/begin core strength    PT Home Exercise Plan Access Code: [email or web address]    Consulted and Agree with Plan of Care Patient           Patient will benefit from skilled therapeutic intervention in order to improve the following deficits and impairments:  Pain, Postural dysfunction, Decreased strength, Increased muscle spasms, Increased fascial restricitons, Decreased range of motion  Visit Diagnosis: Muscle weakness (generalized)  Chronic right-sided low back pain without sciatica  Abnormal posture  Muscle spasm of back     Problem List Patient Active Problem List   Diagnosis Date Noted  . Preventive measure 12/18/2012  . Stress and adjustment reaction 12/18/2012    Jule Ser, PT 06/28/2020, 3:31 PM  [name] [address], [place], [place], [address] Phone: [phone]   Fax:  [phone]  Name: Michael [name] MRN: [id number] Date of Birth: 06/11/1956

## 2020-06-28 NOTE — Patient Instructions (Addendum)

## 2020-07-06 ENCOUNTER — Ambulatory Visit: Payer: Federal, State, Local not specified - PPO | Attending: Family Medicine | Admitting: Physical Therapy

## 2020-07-06 ENCOUNTER — Encounter: Payer: Self-pay | Admitting: Physical Therapy

## 2020-07-06 ENCOUNTER — Other Ambulatory Visit: Payer: Self-pay

## 2020-07-06 DIAGNOSIS — R293 Abnormal posture: Secondary | ICD-10-CM

## 2020-07-06 DIAGNOSIS — M6281 Muscle weakness (generalized): Secondary | ICD-10-CM | POA: Diagnosis not present

## 2020-07-06 DIAGNOSIS — G8929 Other chronic pain: Secondary | ICD-10-CM

## 2020-07-06 DIAGNOSIS — M545 Low back pain, unspecified: Secondary | ICD-10-CM

## 2020-07-06 DIAGNOSIS — M62830 Muscle spasm of back: Secondary | ICD-10-CM | POA: Diagnosis not present

## 2020-07-06 NOTE — Therapy (Signed)
[facility] [address] [place], [place], [address] Phone: [phone]   Fax:  [phone]  Physical Therapy Treatment  Patient Details  Name: Michael [name] MRN: [id number] Date of Birth: 1956/02/14 Referring Provider (PT): [name], MD   Encounter Date: 07/06/2020   PT End of Session - 07/06/20 0757    Visit Number 4    Date for PT Re-Evaluation 08/12/20    Authorization Type BCBS    PT Start Time 0755    PT Stop Time 0846    PT Time Calculation (min) 51 min    Activity Tolerance Patient tolerated treatment well    Behavior During Therapy [facility] for tasks assessed/performed           History reviewed. No pertinent past medical history.  Past Surgical History:  Procedure Laterality Date  . [place] SURGERY  2014  . right ankle fracture  1978   external pinning    There were no vitals filed for this visit.   Subjective Assessment - 07/06/20 0800    Subjective I feel a little better. Did well with the needling.    Pertinent History L5/S1 disc surgery 2014    Diagnostic tests CT scan Jan 2020    Currently in Pain? Yes   Deep into my sides   Pain Score 3     Pain Location --   Tenderness   Pain Orientation Right;Left    Pain Descriptors / Indicators Tender    Aggravating Factors  Sneezing    Pain Relieving Factors the needling helped and streching    Multiple Pain Sites No                             [facility] Adult PT Treatment/Exercise - 07/06/20 0001      Lumbar Exercises: Stretches   Active Hamstring Stretch Right;Left;2 reps;30 seconds    Active Hamstring Stretch Limitations with strap    Single Knee to Chest Stretch Right;Left;2 reps;30 seconds    Single Knee to Chest Stretch Limitations opposite leg straight for hip flexor L1 stretch    Other Lumbar Stretch Exercise hip flexor stretch   off the bed     Manual Therapy   Soft tissue mobilization Bil psoas  release in supine   addaday assist to Rt QL supine and SL                   PT Short Term Goals - 06/28/20 0835      PT SHORT TERM GOAL #1   Title Pt will be ind with initial HEP    Status Achieved             PT Long Term Goals - 06/17/20 1026      PT LONG TERM GOAL #1   Title Pt will be able to bend down to pick up pen or tie shoes with pain <6/10    Baseline >6/10 up to 10/10 when bending down below his knees    Time 8    Period Weeks    Status New    Target Date 08/12/20      PT LONG TERM GOAL #2   Title Pt will be able to demonstrate improved upright posture without feeling he is being pulled towards the Rt due to improved soft tissue mobility    Time 8    Period Weeks    Status New  Target Date 08/12/20      PT LONG TERM GOAL #3   Title Pt will report at least 50% less pain overall during normal daily activities    Time 8    Period Weeks    Status New    Target Date 08/12/20      PT LONG TERM GOAL #4   Title FOTO < or = to 42% limited    Time 8    Period Weeks    Status New    Target Date 08/12/20                 Plan - 07/06/20 0757    Clinical Impression Statement Pt arrives for treatment with reported less pain. He reports the needling and stretching are helping. Hip flexor stretching was added to HEP today. Pt had tight RT QL which was improved with manual and Addaday techniques. Out of time today to begin core strengtrhening will start next week.    Personal Factors and Comorbidities Comorbidity 2    Comorbidities thoracic cyst; history of lumbar disc surgery    Examination-Activity Limitations Dressing;Transfers;Bend;Lift;Other    Examination-Participation Restrictions Cleaning;Yard Work    [employer] Evolving/Moderate complexity    Rehab Potential Good    PT Frequency 1x / week    PT Duration 8 weeks    PT Treatment/Interventions ADLs/Self Care Home Management;Biofeedback;Cryotherapy;Electrical  Stimulation;Neuromuscular re-education;Therapeutic exercise;Therapeutic activities;Patient/family education;Manual techniques;Dry needling;Passive range of motion;Taping;Moist Heat    PT Next Visit Plan Begin core strength out of time today, DN next    PT Home Exercise Plan Access Code: [email or web address]    Consulted and Agree with Plan of Care Patient           Patient will benefit from skilled therapeutic intervention in order to improve the following deficits and impairments:  Pain, Postural dysfunction, Decreased strength, Increased muscle spasms, Increased fascial restricitons, Decreased range of motion  Visit Diagnosis: Muscle weakness (generalized)  Chronic right-sided low back pain without sciatica  Abnormal posture  Muscle spasm of back     Problem List Patient Active Problem List   Diagnosis Date Noted  . Preventive measure 12/18/2012  . Stress and adjustment reaction 12/18/2012    Mariette Cowley, PTA 07/06/2020, 8:49 AM  [facility] [address] [place], [place], [address] Phone: [phone]   Fax:  [phone]  Name: Michael [name] MRN: [id number] Date of Birth: 1956-07-23  Access Code: [email or web address]: [email or web address]: 09/08/2021Prepared by: Anderson Malta CochranExercises  Hooklying Single Knee to Chest Stretch - 2 x daily - 7 x weekly - 3 sets - 20 hold  Supine Piriformis Stretch with Foot on Ground - 2 x daily - 7 x weekly - 3 sets - 20 hold  Supine Hamstring Stretch with Strap - 2 x daily - 7 x weekly - 3 sets - 30 hold  Modified Thomas Stretch - 2 x daily - 7 x weekly - 3 sets - 30 hold  Supine Single Knee to Chest Stretch - 2 x daily - 7 x weekly - 3 sets - 30 hold Access Code: [email or web address]: [email or web address]: 09/08/2021Prepared by: Anderson Malta CochranExercises  Hooklying Single Knee to Chest Stretch - 2 x daily - 7 x weekly - 3 sets - 20 hold  Supine  Piriformis Stretch with Foot on Ground - 2 x daily - 7 x weekly - 3 sets - 20 hold  Supine Hamstring Stretch with Strap - 2 x daily - 7 x weekly - 3 sets -  30 hold  Modified Thomas Stretch - 2 x daily - 7 x weekly - 3 sets - 30 hold  Supine Single Knee to Chest Stretch - 2 x daily - 7 x weekly - 3 sets - 30 hold  TL Sidebending Stretch - Single Arm Overhead - 3 x daily - 7 x weekly - 3 sets - 2 reps - 20 hold

## 2020-07-12 ENCOUNTER — Encounter: Payer: Self-pay | Admitting: Physical Therapy

## 2020-07-12 ENCOUNTER — Ambulatory Visit: Payer: Federal, State, Local not specified - PPO | Admitting: Physical Therapy

## 2020-07-12 ENCOUNTER — Other Ambulatory Visit: Payer: Self-pay

## 2020-07-12 DIAGNOSIS — M62830 Muscle spasm of back: Secondary | ICD-10-CM | POA: Diagnosis not present

## 2020-07-12 DIAGNOSIS — M545 Low back pain, unspecified: Secondary | ICD-10-CM

## 2020-07-12 DIAGNOSIS — R293 Abnormal posture: Secondary | ICD-10-CM

## 2020-07-12 DIAGNOSIS — G8929 Other chronic pain: Secondary | ICD-10-CM | POA: Diagnosis not present

## 2020-07-12 DIAGNOSIS — M6281 Muscle weakness (generalized): Secondary | ICD-10-CM | POA: Diagnosis not present

## 2020-07-12 NOTE — Patient Instructions (Addendum)
[email or web address] Code: [email or web address] URL: [email or web address] Date: 07/12/2020 Prepared by: Jari Favre  Exercises Hooklying Single Knee to Chest Stretch - 2 x daily - 7 x weekly - 3 sets - 20 hold Supine Piriformis Stretch with Foot on Ground - 2 x daily - 7 x weekly - 3 sets - 20 hold Supine Hamstring Stretch with Strap - 2 x daily - 7 x weekly - 3 sets - 30 hold Modified Thomas Stretch - 2 x daily - 7 x weekly - 3 sets - 30 hold Supine Single Knee to Chest Stretch - 2 x daily - 7 x weekly - 3 sets - 30 hold TL Sidebending Stretch - Single Arm Overhead - 3 x daily - 7 x weekly - 3 sets - 2 reps - 20 hold Supine Transversus Abdominis Bracing - Hands on Thighs - 1 x daily - 7 x weekly - 10 reps - 1 sets - 5 sec hold

## 2020-07-12 NOTE — Therapy (Signed)
[facility] [address] [place], [place], [address] Phone: [phone]   Fax:  [phone]  Physical Therapy Treatment  Patient Details  Name: Michael [name] MRN: [id number] Date of Birth: 1956/09/18 Referring Provider (PT): [name], MD   Encounter Date: 07/12/2020   PT End of Session - 07/12/20 1113    Visit Number 5    Date for PT Re-Evaluation 08/12/20    Authorization Type BCBS    PT Start Time 1101    PT Stop Time 1143    PT Time Calculation (min) 42 min           History reviewed. No pertinent past medical history.  Past Surgical History:  Procedure Laterality Date  . [place] SURGERY  2014  . right ankle fracture  1978   external pinning    There were no vitals filed for this visit.   Subjective Assessment - 07/12/20 1109    Subjective I feel like I can side bend more.  I feel the nerves in the lower areas when doing the needling.  I liked that a lot.  I am mowing the lawn and can only hold it for so long    Pertinent History L5/S1 disc surgery 2014    Diagnostic tests CT scan Jan 2020    Currently in Pain? No/denies                             [facility] Adult PT Treatment/Exercise - 07/12/20 0001      Neuro Re-ed    Neuro Re-ed Details  educated and performed box breathing - 3 sec each section; added to HEP      Manual Therapy   Soft tissue mobilization lumbar and throracic paraspinals; breathing with overpressure to ribcage to increase rebcage mobility            Trigger Point Dry Needling - 07/12/20 0001    Consent Given? Yes    Education Handout Provided Previously provided    Lumbar multifidi Response Twitch response elicited;Palpable increased muscle length    Thoracic multifidi response Twitch response elicited;Palpable increased muscle length                PT Education - 07/12/20 1147    Education Details Access Code: [email or web address]     Person(s) Educated Patient    Methods Explanation;Demonstration;Verbal cues;Handout;Tactile cues    Comprehension Verbalized understanding;Returned demonstration            PT Short Term Goals - 06/28/20 0835      PT SHORT TERM GOAL #1   Title Pt will be ind with initial HEP    Status Achieved             PT Long Term Goals - 06/17/20 1026      PT LONG TERM GOAL #1   Title Pt will be able to bend down to pick up pen or tie shoes with pain <6/10    Baseline >6/10 up to 10/10 when bending down below his knees    Time 8    Period Weeks    Status New    Target Date 08/12/20      PT LONG TERM GOAL #2   Title Pt will be able to demonstrate improved upright posture without feeling he is being pulled towards the Rt due to improved soft tissue mobility    Time 8    Period  Weeks    Status New    Target Date 08/12/20      PT LONG TERM GOAL #3   Title Pt will report at least 50% less pain overall during normal daily activities    Time 8    Period Weeks    Status New    Target Date 08/12/20      PT LONG TERM GOAL #4   Title FOTO < or = to 42% limited    Time 8    Period Weeks    Status New    Target Date 08/12/20                 Plan - 07/12/20 1150    Clinical Impression Statement Pt seems to have some difficutly activating the core and overusing his diaphragm and taking shallow breaths.  He was educated in and performed box breathing with 3 sec time for inhale, hold in, exhale, hold out.  Pt di dwell with this technique.  He was able to activate TrA with press hands on thighs.  Only had time to initiate core strengthening.  He will benefit from skilled PT to continue to work on core strength soft tissue mobilization for pain management.    PT Treatment/Interventions ADLs/Self Care Home Management;Biofeedback;Cryotherapy;Electrical Stimulation;Neuromuscular re-education;Therapeutic exercise;Therapeutic activities;Patient/family education;Manual techniques;Dry  needling;Passive range of motion;Taping;Moist Heat    PT Next Visit Plan progress core strength    PT Home Exercise Plan Access Code: [email or web address]    Consulted and Agree with Plan of Care Patient           Patient will benefit from skilled therapeutic intervention in order to improve the following deficits and impairments:  Pain, Postural dysfunction, Decreased strength, Increased muscle spasms, Increased fascial restricitons, Decreased range of motion  Visit Diagnosis: Muscle weakness (generalized)  Chronic right-sided low back pain without sciatica  Abnormal posture  Muscle spasm of back     Problem List Patient Active Problem List   Diagnosis Date Noted  . Preventive measure 12/18/2012  . Stress and adjustment reaction 12/18/2012    Jule Ser, PT 07/12/2020, 12:41 PM  [facility] [address] [place], [place], [address] Phone: [phone]   Fax:  [phone]  Name: Michael [name] MRN: [id number] Date of Birth: 02/10/1956

## 2020-07-22 ENCOUNTER — Ambulatory Visit: Payer: Federal, State, Local not specified - PPO | Admitting: Physical Therapy

## 2020-07-22 ENCOUNTER — Encounter: Payer: Self-pay | Admitting: Physical Therapy

## 2020-07-22 ENCOUNTER — Other Ambulatory Visit: Payer: Self-pay

## 2020-07-22 DIAGNOSIS — M545 Low back pain, unspecified: Secondary | ICD-10-CM

## 2020-07-22 DIAGNOSIS — G8929 Other chronic pain: Secondary | ICD-10-CM | POA: Diagnosis not present

## 2020-07-22 DIAGNOSIS — M6281 Muscle weakness (generalized): Secondary | ICD-10-CM

## 2020-07-22 DIAGNOSIS — M62830 Muscle spasm of back: Secondary | ICD-10-CM | POA: Diagnosis not present

## 2020-07-22 DIAGNOSIS — R293 Abnormal posture: Secondary | ICD-10-CM | POA: Diagnosis not present

## 2020-07-22 NOTE — Therapy (Signed)
[facility] [address] [place], [place], [address] Phone: [phone]   Fax:  [phone]  Physical Therapy Treatment  Patient Details  Name: Donovin Kraemer MRN: [id number] Date of Birth: 1956/05/01 Referring Provider (PT): [name], MD   Encounter Date: 07/22/2020   PT End of Session - 07/22/20 0809    Visit Number 6    Date for PT Re-Evaluation 08/12/20    Authorization Type BCBS    PT Start Time 0802    PT Stop Time 4765    PT Time Calculation (min) 42 min    Activity Tolerance Patient tolerated treatment well    Behavior During Therapy [facility] for tasks assessed/performed           History reviewed. No pertinent past medical history.  Past Surgical History:  Procedure Laterality Date  . [place] SURGERY  2014  . right ankle fracture  1978   external pinning    There were no vitals filed for this visit.   Subjective Assessment - 07/22/20 0804    Subjective I am "60%" improved since eval    Pertinent History L5/S1 disc surgery 2014    Currently in Pain? Yes    Pain Score 2     Pain Location Back    Pain Orientation Right;Lower    Pain Descriptors / Indicators Dull;Aching    Multiple Pain Sites No                             [facility] Adult PT Treatment/Exercise - 07/22/20 0001      Lumbar Exercises: Stretches   Other Lumbar Stretch Exercise Seated combo stretch for LT hamstring RT QL seated with strap       Lumbar Exercises: Standing   Other Standing Lumbar Exercises Standing 90 degree > strtech and Bil QL at counter top      Lumbar Exercises: Supine   Large Ball Abdominal Isometric --   with mini march 10x bil     Manual Therapy   Soft tissue mobilization Addaday and maunual release to Bil QL and lateral trunk soft tissues duirng streches and static/resting position at end of session                    PT Short Term Goals - 06/28/20 0835       PT SHORT TERM GOAL #1   Title Pt will be ind with initial HEP    Status Achieved             PT Long Term Goals - 06/17/20 1026      PT LONG TERM GOAL #1   Title Pt will be able to bend down to pick up pen or tie shoes with pain <6/10    Baseline >6/10 up to 10/10 when bending down below his knees    Time 8    Period Weeks    Status New    Target Date 08/12/20      PT LONG TERM GOAL #2   Title Pt will be able to demonstrate improved upright posture without feeling he is being pulled towards the Rt due to improved soft tissue mobility    Time 8    Period Weeks    Status New    Target Date 08/12/20      PT LONG TERM GOAL #3   Title Pt will report at least 50% less pain overall  during normal daily activities    Time 8    Period Weeks    Status New    Target Date 08/12/20      PT LONG TERM GOAL #4   Title FOTO < or = to 42% limited    Time 8    Period Weeks    Status New    Target Date 08/12/20                 Plan - 07/22/20 0810    Clinical Impression Statement Pt reports 60% improvement since evaluation. Treatment focused 50/50 on soft tissue mobility/ flexibility of the trunk and lumbopelvic regions and manual assist to mainly the QL and lateral soft tissues to aide in release. Pt improving lower abdominal contraction with supie core work.    Personal Factors and Comorbidities Comorbidity 2    Comorbidities thoracic cyst; history of lumbar disc surgery    Examination-Activity Limitations Dressing;Transfers;Bend;Lift;Other    Examination-Participation Restrictions Cleaning;Yard Work    [employer] officer Evolving/Moderate complexity    Rehab Potential Good    PT Frequency 1x / week    PT Duration 8 weeks    PT Treatment/Interventions ADLs/Self Care Home Management;Biofeedback;Cryotherapy;Electrical Stimulation;Neuromuscular re-education;Therapeutic exercise;Therapeutic activities;Patient/family education;Manual techniques;Dry needling;Passive  range of motion;Taping;Moist Heat    PT Next Visit Plan progress core strength    PT Home Exercise Plan Access Code: [email or web address]    Consulted and Agree with Plan of Care Patient           Patient will benefit from skilled therapeutic intervention in order to improve the following deficits and impairments:  Pain, Postural dysfunction, Decreased strength, Increased muscle spasms, Increased fascial restricitons, Decreased range of motion  Visit Diagnosis: Muscle weakness (generalized)  Chronic right-sided low back pain without sciatica  Abnormal posture     Problem List Patient Active Problem List   Diagnosis Date Noted  . Preventive measure 12/18/2012  . Stress and adjustment reaction 12/18/2012    Zymarion Favorite, PTA 07/22/2020, 8:44 AM  [facility] [address] [place], [place], [address] Phone: [phone]   Fax:  [phone]  Name: Keneth Borg MRN: [id number] Date of Birth: April 30, 1956

## 2020-07-26 ENCOUNTER — Ambulatory Visit: Payer: Federal, State, Local not specified - PPO | Admitting: Physical Therapy

## 2020-07-26 ENCOUNTER — Other Ambulatory Visit: Payer: Self-pay

## 2020-07-26 DIAGNOSIS — R293 Abnormal posture: Secondary | ICD-10-CM

## 2020-07-26 DIAGNOSIS — M6281 Muscle weakness (generalized): Secondary | ICD-10-CM

## 2020-07-26 DIAGNOSIS — M62830 Muscle spasm of back: Secondary | ICD-10-CM

## 2020-07-26 DIAGNOSIS — M545 Low back pain, unspecified: Secondary | ICD-10-CM

## 2020-07-26 DIAGNOSIS — G8929 Other chronic pain: Secondary | ICD-10-CM | POA: Diagnosis not present

## 2020-07-26 NOTE — Therapy (Signed)
[facility] [address] [place], [place], [address] Phone: [phone]   Fax:  [phone]  Physical Therapy Treatment  Patient Details  Name: Michael [name] MRN: [id number] Date of Birth: 03/24/1956 Referring Provider (PT): [name], MD   Encounter Date: 07/26/2020   PT End of Session - 07/26/20 1353    Visit Number 7    Date for PT Re-Evaluation 08/12/20    Authorization Type BCBS    PT Start Time 1233    PT Stop Time 5643    PT Time Calculation (min) 40 min    Activity Tolerance Patient tolerated treatment well    Behavior During Therapy [facility] for tasks assessed/performed           No past medical history on file.  Past Surgical History:  Procedure Laterality Date  . [place] SURGERY  2014  . right ankle fracture  1978   external pinning    There were no vitals filed for this visit.   Subjective Assessment - 07/26/20 1352    Subjective I am feeling better and moving more.  I still feel there is a lot of weakness in my core    Diagnostic tests CT scan Jan 2020    Currently in Pain? Yes    Pain Location Back                             [facility] Adult PT Treatment/Exercise - 07/26/20 0001      Lumbar Exercises: Supine   Bent Knee Raise 20 reps    Dead Bug Limitations LE only then added UE with band pull - blue band with PT holding - 20x      Lumbar Exercises: Quadruped   Single Arm Raise Right;Left;5 reps   on pball   Straight Leg Raise 10 reps   on pball     Manual Therapy   Soft tissue mobilization thoracic and lumbar paraspinlal            Trigger Point Dry Needling - 07/26/20 0001    Consent Given? Yes    Education Handout Provided Previously provided    Lumbar multifidi Response Twitch response elicited;Palpable increased muscle length    Thoracic multifidi response Twitch response elicited;Palpable increased muscle length                PT  Education - 07/26/20 1353    Education Details added core exercises to HEP    Person(s) Educated Patient    Methods Explanation;Demonstration;Tactile cues;Verbal cues;Handout    Comprehension Verbalized understanding;Returned demonstration            PT Short Term Goals - 06/28/20 0835      PT SHORT TERM GOAL #1   Title Pt will be ind with initial HEP    Status Achieved             PT Long Term Goals - 06/17/20 1026      PT LONG TERM GOAL #1   Title Pt will be able to bend down to pick up pen or tie shoes with pain <6/10    Baseline >6/10 up to 10/10 when bending down below his knees    Time 8    Period Weeks    Status New    Target Date 08/12/20      PT LONG TERM GOAL #2   Title Pt will be able to demonstrate improved  upright posture without feeling he is being pulled towards the Rt due to improved soft tissue mobility    Time 8    Period Weeks    Status New    Target Date 08/12/20      PT LONG TERM GOAL #3   Title Pt will report at least 50% less pain overall during normal daily activities    Time 8    Period Weeks    Status New    Target Date 08/12/20      PT LONG TERM GOAL #4   Title FOTO < or = to 42% limited    Time 8    Period Weeks    Status New    Target Date 08/12/20                 Plan - 07/26/20 1354    Clinical Impression Statement Pt did well with added difficulty of exercises today.  Treatment focused on addition of core activation and trunk stability in neutral alignment. Pt has weakness on Rt side of his trunk secondary to his lean to the right with his pain.  Pt brought his ball to today's sesison and was able to perform exercises and stretches using pball which were added to HEP.    PT Treatment/Interventions ADLs/Self Care Home Management;Biofeedback;Cryotherapy;Electrical Stimulation;Neuromuscular re-education;Therapeutic exercise;Therapeutic activities;Patient/family education;Manual techniques;Dry needling;Passive range of  motion;Taping;Moist Heat    PT Next Visit Plan progress core strength, DN, goals    PT Home Exercise Plan Access Code: [email or web address]    Consulted and Agree with Plan of Care Patient           Patient will benefit from skilled therapeutic intervention in order to improve the following deficits and impairments:  Pain, Postural dysfunction, Decreased strength, Increased muscle spasms, Increased fascial restricitons, Decreased range of motion  Visit Diagnosis: Muscle weakness (generalized)  Chronic right-sided low back pain without sciatica  Abnormal posture  Muscle spasm of back     Problem List Patient Active Problem List   Diagnosis Date Noted  . Preventive measure 12/18/2012  . Stress and adjustment reaction 12/18/2012    Jule Ser, PT 07/26/2020, 1:57 PM  [facility] [address], [place], [place], [address] Phone: [phone]   Fax:  [phone]  Name: Michael [name] MRN: [id number] Date of Birth: 06-Jul-1956

## 2020-08-01 ENCOUNTER — Encounter: Payer: Self-pay | Admitting: Physical Therapy

## 2020-08-01 ENCOUNTER — Ambulatory Visit: Payer: Federal, State, Local not specified - PPO | Attending: Family Medicine | Admitting: Physical Therapy

## 2020-08-01 ENCOUNTER — Other Ambulatory Visit: Payer: Self-pay

## 2020-08-01 DIAGNOSIS — M545 Low back pain, unspecified: Secondary | ICD-10-CM | POA: Insufficient documentation

## 2020-08-01 DIAGNOSIS — M6281 Muscle weakness (generalized): Secondary | ICD-10-CM | POA: Insufficient documentation

## 2020-08-01 DIAGNOSIS — R293 Abnormal posture: Secondary | ICD-10-CM | POA: Diagnosis not present

## 2020-08-01 DIAGNOSIS — M62830 Muscle spasm of back: Secondary | ICD-10-CM | POA: Diagnosis not present

## 2020-08-01 DIAGNOSIS — G8929 Other chronic pain: Secondary | ICD-10-CM | POA: Diagnosis not present

## 2020-08-01 NOTE — Therapy (Signed)
[facility] [address] [place], [place], [address] Phone: [phone]   Fax:  [phone]  Physical Therapy Treatment  Patient Details  Name: Michael [name] MRN: [id number] Date of Birth: [birth year] Referring Provider (PT): [name], MD   Encounter Date: 08/01/2020   PT End of Session - 08/01/20 0934    Visit Number 8    Date for PT Re-Evaluation 08/12/20    Authorization Type BC[employer]    PT Start Time 0929    PT Stop Time 1010    PT Time Calculation (min) 41 min    Activity Tolerance Patient tolerated treatment well    Behavior During Therapy [facility] for tasks assessed/performed           History reviewed. No pertinent past medical history.  Past Surgical History:  Procedure Laterality Date  . [place] SURGERY  2014  . right ankle fracture  1978   external pinning    There were no vitals filed for this visit.   Subjective Assessment - 08/01/20 0932    Subjective Pt states he has been feeling some releases in his back and feeling like the adhesion is smaller.  Pt states he sneezed and had to brace but it was as intense.    Currently in Pain? Yes    Pain Score 2     Pain Location Back    Pain Orientation Right    Pain Descriptors / Indicators Aching    Pain Type Chronic pain    Pain Onset More than a month ago    Pain Frequency Intermittent    Multiple Pain Sites No                             [facility] Adult PT Treatment/Exercise - 08/01/20 0001      Lumbar Exercises: Standing   Other Standing Lumbar Exercises isometric shoulder flexion with yellow band - stepping fwd/back 10x    Other Standing Lumbar Exercises hip hinge - 10x      Lumbar Exercises: Seated   Other Seated Lumbar Exercises seated on pball - pelvic rotation 10x each way    Other Seated Lumbar Exercises seated on pball red band rotation 10x each side      Manual Therapy   Soft tissue mobilization Rt  intercostals, obliques, diaphragm, rectus abdominis proximal attatchment                    PT Short Term Goals - 06/28/20 0835      PT SHORT TERM GOAL #1   Title Pt will be ind with initial HEP    Status Achieved             PT Long Term Goals - 08/01/20 0935      PT LONG TERM GOAL #1   Title Pt will be able to bend down to pick up pen or tie shoes with pain <6/10    Baseline not severe pain anymore, it is 3-4/10 down from 10/10    Status Achieved      PT LONG TERM GOAL #2   Title Pt will be able to demonstrate improved upright posture without feeling he is being pulled towards the Rt due to improved soft tissue mobility    Status Achieved      PT LONG TERM GOAL #3   Title Pt will report at least 50% less pain overall during normal daily  activities    Baseline 70% improved overall    Status Achieved      PT LONG TERM GOAL #4   Title FOTO < or = to 42% limited    Baseline 44% limited    Status On-going                 Plan - 08/01/20 1016    Clinical Impression Statement Pt is doing well with his home exercises and stretches.  Pt responded well from STM to anterior abdomen including diaphragm and rectus abdominus especially.  Pt was able to add some strengthening of roation due to improved ROM.  Pt will benefit from one more visit to finalize his HEP so he can continue to progress on his own.    PT Treatment/Interventions ADLs/Self Care Home Management;Biofeedback;Cryotherapy;Electrical Stimulation;Neuromuscular re-education;Therapeutic exercise;Therapeutic activities;Patient/family education;Manual techniques;Dry needling;Passive range of motion;Taping;Moist Heat    PT Next Visit Plan progress core strength and final HEP - d/c today    PT Home Exercise Plan Access Code: [email or web address]    Consulted and Agree with Plan of Care Patient           Patient will benefit from skilled therapeutic intervention in order to improve the following deficits and  impairments:  Pain, Postural dysfunction, Decreased strength, Increased muscle spasms, Increased fascial restricitons, Decreased range of motion  Visit Diagnosis: Muscle weakness (generalized)  Chronic right-sided low back pain without sciatica  Abnormal posture  Muscle spasm of back     Problem List Patient Active Problem List   Diagnosis Date Noted  . Preventive measure 12/18/2012  . Stress and adjustment reaction 12/18/2012    Jule Ser, PT 08/01/2020, 10:18 AM  [facility] [address] [place], [place], [address] Phone: [phone]   Fax:  [phone]  Name: Michael [name] MRN: [id number] Date of Birth: 01-Aug-1956

## 2020-08-10 ENCOUNTER — Ambulatory Visit: Payer: Federal, State, Local not specified - PPO | Admitting: Physical Therapy

## 2020-08-10 ENCOUNTER — Encounter: Payer: Self-pay | Admitting: Physical Therapy

## 2020-08-10 ENCOUNTER — Other Ambulatory Visit: Payer: Self-pay

## 2020-08-10 DIAGNOSIS — M545 Low back pain, unspecified: Secondary | ICD-10-CM | POA: Diagnosis not present

## 2020-08-10 DIAGNOSIS — M62830 Muscle spasm of back: Secondary | ICD-10-CM

## 2020-08-10 DIAGNOSIS — G8929 Other chronic pain: Secondary | ICD-10-CM | POA: Diagnosis not present

## 2020-08-10 DIAGNOSIS — M6281 Muscle weakness (generalized): Secondary | ICD-10-CM | POA: Diagnosis not present

## 2020-08-10 DIAGNOSIS — R293 Abnormal posture: Secondary | ICD-10-CM

## 2020-08-10 NOTE — Therapy (Addendum)
[facility] [address], [place], [place], [address] Phone: [phone]   Fax:  [phone]  Physical Therapy Treatment  Patient Details  Name: Michael [name] MRN: [id number] Date of Birth: 1956-10-25 Referring Provider (PT): [name], MD   Encounter Date: 08/10/2020   PT End of Session - 08/10/20 0800    Visit Number 9    Date for PT Re-Evaluation 08/12/20    PT Start Time 0755    PT Stop Time 0845    PT Time Calculation (min) 50 min    Activity Tolerance Patient tolerated treatment well    Behavior During Therapy [facility] for tasks assessed/performed           History reviewed. No pertinent past medical history.  Past Surgical History:  Procedure Laterality Date  . [place] SURGERY  2014  . right ankle fracture  1978   external pinning    There were no vitals filed for this visit.   Subjective Assessment - 08/10/20 0759    Subjective i feel like I am healing from the inside.    Pertinent History L5/S1 disc surgery 2014    Currently in Pain? Yes    Pain Location Back    Pain Orientation Right    Pain Descriptors / Indicators Dull    Aggravating Factors  Sneezing and coughing but it is better    Effect of Pain on Daily Activities Stretching    Multiple Pain Sites No              [facility] PT Assessment - 08/10/20 0001      Assessment   Medical Diagnosis M54.5 (ICD-10-CM) - Lumbar back pain    Referring Provider (PT) [name], MD      Observation/Other Assessments   Focus on Therapeutic Outcomes (FOTO)  44% limited      Strength   Overall Strength Comments RT hip flexor 4+/5, Rt hip abd 5/5                         [facility] Adult PT Treatment/Exercise - 08/10/20 0001      Lumbar Exercises: Stretches   Active Hamstring Stretch Right;2 reps;30 seconds    Lower Trunk Rotation 3 reps;10 seconds    Lower Trunk Rotation Limitations VC to breath into RT cage      Other Lumbar Stretch Exercise Green ball childs pose stretch 5x, RT/LT  bias 5x      Lumbar Exercises: Aerobic   Nustep L2 x 10 min with review of status and goals      Lumbar Exercises: Quadruped   Opposite Arm/Leg Raise Right arm/Left leg;Left arm/Right leg;5 reps;3 seconds                  PT Education - 08/10/20 0835    Education Details Edcuated pt on exercises in the pool he can start with before participating in strokes    Person(s) Educated Patient    Methods Explanation    Comprehension Verbalized understanding            PT Short Term Goals - 06/28/20 0835      PT SHORT TERM GOAL #1   Title Pt will be ind with initial HEP    Status Achieved             PT Long Term Goals - 08/10/20 0824      PT LONG TERM GOAL #1  Title Pt will be able to bend down to pick up pen or tie shoes with pain <6/10    Baseline not severe pain anymore, it is 3-4/10 down from 10/10    Time 8    Period Weeks    Status Achieved      PT LONG TERM GOAL #2   Title Pt will be able to demonstrate improved upright posture without feeling he is being pulled towards the Rt due to improved soft tissue mobility    Time 8    Period Weeks    Status Achieved      PT LONG TERM GOAL #3   Title Pt will report at least 50% less pain overall during normal daily activities    Baseline 70% improved overall    Time 8    Period Weeks    Status Achieved      PT LONG TERM GOAL #4   Title FOTO < or = to 42% limited    Baseline 44% limited    Time 8    Period Weeks    Status Not Met                 Plan - 08/10/20 0801    Clinical Impression Statement Pt reports he feels like he "is healing from the inside." He reports he is hanging upside down from his pull up bar at home to get decompression. Pt's Rt hip strength has improved. FOTO score also much improved but missed teh goal mark by a few % points. All other goals were met.    Personal Factors and Comorbidities Comorbidity 2     Comorbidities thoracic cyst; history of lumbar disc surgery    Examination-Activity Limitations Dressing;Transfers;Bend;Lift;Other    Examination-Participation Restrictions Cleaning;Yard Work    [employer] Evolving/Moderate complexity    Rehab Potential Good    PT Frequency 1x / week    PT Duration 8 weeks    PT Next Visit Plan DC to HEP    PT Home Exercise Plan Access Code: [email or web address]    Consulted and Agree with Plan of Care Patient           Patient will benefit from skilled therapeutic intervention in order to improve the following deficits and impairments:  Pain, Postural dysfunction, Decreased strength, Increased muscle spasms, Increased fascial restricitons, Decreased range of motion  Visit Diagnosis: Muscle weakness (generalized)  Chronic right-sided low back pain without sciatica  Abnormal posture  Muscle spasm of back     Problem List Patient Active Problem List   Diagnosis Date Noted  . Preventive measure 12/18/2012  . Stress and adjustment reaction 12/18/2012    Yaelis Scharfenberg, PTA 08/10/2020, 8:53 AM  [facility] [address], [place], [place], [address] Phone: [phone]   Fax:  [phone]  Name: Michael [name] MRN: [id number] Date of Birth: 11/23/1955  PHYSICAL THERAPY DISCHARGE SUMMARY  Visits from Start of Care: 9  Current functional level related to goals / functional outcomes: See above goals   Remaining deficits: See above goals   Education / Equipment: HEP  Plan: Patient agrees to discharge.  Patient goals were met. Patient is being discharged due to being pleased with the current functional level.  ?????    [employer], PT 08/22/20 8:55 AM

## 2020-10-05 ENCOUNTER — Other Ambulatory Visit: Payer: Self-pay

## 2020-10-05 ENCOUNTER — Telehealth: Payer: Federal, State, Local not specified - PPO | Admitting: Family Medicine

## 2020-10-05 DIAGNOSIS — H538 Other visual disturbances: Secondary | ICD-10-CM

## 2020-10-05 DIAGNOSIS — G8929 Other chronic pain: Secondary | ICD-10-CM | POA: Diagnosis not present

## 2020-10-05 DIAGNOSIS — M545 Low back pain, unspecified: Secondary | ICD-10-CM

## 2020-10-05 NOTE — Progress Notes (Signed)
Virtual Visit via Video Note  I connected with Michael [name]  on 10/05/20 at  1:00 PM EST by a video enabled telemedicine application and verified that I am speaking with the correct person using two identifiers.  Location patient: home Location provider: [place], [place] [address] Persons participating in the virtual visit: patient, provider  I discussed the limitations of evaluation and management by telemedicine and the availability of in person appointments. The patient expressed understanding and agreed to proceed.  Interface was not connecting; so visit was continued from telephone.   Michael [name] DOB: 18-Jul-1956 Encounter date: 10/05/2020  This is a 64 y.o. male who presents with No chief complaint on file.   History of present illness:  Physical therapy went well for him. Mobility had been decreased due to the right lower back pain he was having and how mobility had decreased secondary to this.   However, still has pain in there - like there are fingers up under the ribs. Hard to bend over; still some limitations. He feels that body is curving to the right. Anteriorly below last rib feels about 4-5 inches deep - on right.   Lifted bag of fertilizer 30lb; and then paid for this the next day. Has to be careful with lifting and with doing things in certain positions - like bending forward to pick something up. Has gone through a lot of epson salts.   Urology doc told him it would go away.   Can't jog, even a few steps. Sneezing, coughing flares up pain.   He is worried that build up of size of cyst is creating problems and displacing other things in this area.   On 10 scale - he feels he is growing tolerant to pain. Would rank as 5-6. Feels like tolerance improved after PT. When stretching feels that pain is in same area of sacrum/lower lumbar.   He is having issue with IT band on left Can't turn; lay on left side because of  this.   Has been a year since eye exam. States that there is film sometimes in vision. No drainage. No eye pain. Notes more in last couple of months. 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   No Known Allergies No outpatient medications have been marked as taking for the 10/05/20 encounter (Video Visit) with [name], MD.    Review of Systems  Constitutional: Negative for chills, fatigue and fever.  Respiratory: Negative for cough, chest tightness, shortness of breath and wheezing.   Cardiovascular: Negative for chest pain, palpitations and leg swelling.  Gastrointestinal: Positive for abdominal pain (mild abd discomfort; more deep; just with certain changes in position; sometimes notes discomfort with eating).  Genitourinary: Negative for difficulty urinating, dysuria and penile pain (does have low back pain with masturbation).  Musculoskeletal: Positive for back pain.    Objective:  There were no vitals taken for this visit.      BP Readings from Last 3 Encounters:  05/06/20 128/80  11/06/19 110/70  11/21/18 (!) 142/82   Wt Readings from Last 3 Encounters:  05/06/20 219 lb 1.6 oz (99.4 kg)  11/06/19 210 lb (95.3 kg)  11/21/18 216 lb 11.2 oz (98.3 kg)    EXAM: Current weight is 220lb GENERAL: sounds alert, oriented, well and in no acute distress LUNGS: no shortness of breath, cough PSYCH/NEURO: pleasant and cooperative, no obvious depression or anxiety, speech and thought processing grossly intact  Assessment/Plan  1. Chronic right-sided low back pain without sciatica Will get records from urology from [place] so I can review cyst with him. He feels this is source of pain now, so I would like to review this. If cyst is not significant   follow up pending - Ct results from urology. JoAnne spoke with their office today and they are sending over report.  I discussed the assessment and treatment plan with the patient. The patient was provided an opportunity  to ask questions and all were answered. The patient agreed with the plan and demonstrated an understanding of the instructions.   The patient was advised to call back or seek an in-person evaluation if the symptoms worsen or if the condition fails to improve as anticipated.  I provided 40 minutes of non-face-to-face time during this encounter.reassurance, discussion of cysts on imaging, etc.    Micheline Rough, MD

## 2020-10-06 ENCOUNTER — Other Ambulatory Visit: Payer: Self-pay

## 2020-10-07 ENCOUNTER — Encounter: Payer: Self-pay | Admitting: Family Medicine

## 2020-10-07 DIAGNOSIS — M545 Low back pain, unspecified: Secondary | ICD-10-CM

## 2020-10-14 DIAGNOSIS — R030 Elevated blood-pressure reading, without diagnosis of hypertension: Secondary | ICD-10-CM | POA: Diagnosis not present

## 2020-10-14 DIAGNOSIS — G8929 Other chronic pain: Secondary | ICD-10-CM | POA: Diagnosis not present

## 2020-10-14 DIAGNOSIS — Z6828 Body mass index (BMI) 28.0-28.9, adult: Secondary | ICD-10-CM | POA: Diagnosis not present

## 2020-10-14 DIAGNOSIS — M5442 Lumbago with sciatica, left side: Secondary | ICD-10-CM | POA: Diagnosis not present

## 2020-11-03 DIAGNOSIS — M545 Low back pain, unspecified: Secondary | ICD-10-CM | POA: Diagnosis not present

## 2020-11-03 DIAGNOSIS — M5442 Lumbago with sciatica, left side: Secondary | ICD-10-CM | POA: Diagnosis not present

## 2020-11-22 ENCOUNTER — Encounter: Payer: Self-pay | Admitting: Family Medicine

## 2021-01-04 ENCOUNTER — Encounter: Payer: Self-pay | Admitting: Family Medicine

## 2021-01-13 DIAGNOSIS — R1011 Right upper quadrant pain: Secondary | ICD-10-CM | POA: Diagnosis not present

## 2021-01-31 ENCOUNTER — Other Ambulatory Visit: Payer: Self-pay

## 2021-01-31 NOTE — Progress Notes (Signed)
Michael [name] DOB: 06/04/1956 Encounter date: 02/01/2021  This is a 65 y.o. male who presents with Multiple concerns today   History of present illness: Recent mychart message:  remember the pain started as an intermittent sharp stabbing pain deep in the right side of my back as I begin to rise from a seated position during my break at [employer] months earlier before December of 2020.   When my mysterious symptoms compromised my ability to work safely in December of 2020, I took unpaid leave to rest and recover with no improvement and I have been off since.   When I get out of bed I feel a cord rubbing from under my right side just below my rib and deep into the right side of my pelvic area.   The simplest task of leaning forward from sitting on the side of the bed putting on my pants and tying up my shoelaces has turned into a time-consuming task not quickly done anymore.   The swelling and inflammation in my back remains. My posture is leaning to the right side more.  While sitting I still feel tightness reaching down my left leg to touch the outside of my left foot. The front left and right hip joints seem to lock up as I try to stand after 15 minutes in a seated position. I breathe deeply and slowly to help my muscles loosen from my lower abdominal area so that I may stand erect.  I do have increased mucus from my sinuses and bronchial tubes that doesn't go away.  My lower abdominal still hurts when I sneeze, cough, or accidentally loose my balance, if I brace myself suddenly to prevent falling.   The weight from eating seems to ease my back pains temporarily.   - - -  He states that today is better day than some others. If he sits too long then has to prop self up on knees to stand up. He goes to mom's house to watch TV; and when he goes to stand up; feels like he is bent over until he can take a couple of deep breaths to stand up. Feels this deep inside pelvis/hip  level. Sometimes left leg feels like it is in a catch. Feels like he has some engagement of muscles around hip that doesn't release; makes it feel like leg is paralyzed. Once he goes up stairs and gets leg moving then it improves; but can last up to 30 minutes.   When standing feels like body is tilted to the right.   First time he noted inflammation was when working at [employer] and had pain in right arm that moved up to neck and down to left arm. Now this area in base of back/pelvic area. Felt like pain initially was centered around joints, but at that time he was able to keep working. Once pain started in lower back where he couldn't put on shoes then he had to step away from work. Worries that the migrating pain he had could be related to poorly serviced water dispenser at work - mold,etc at dispenser; just not cleaned properly.   When laying down then when he goes to get up he is stiff in hip flexors bilat - just getting some tightness of this without release. Almost feels like left leg is braced.   He isn't doing regular exercises right now because he feels like this intensifies his discomfort. Level of discomfort doesn't match exertion - ie  mows grass and feels like he was just playing football. Has gained abdominal weight; and feels like he has gained more weight along right side of his back. Feels asymmetric.   Knees have been popping as well.   Feels like something is stuck in bronchial tubes; sometimes coughs at night. Sinuses drain. This has been happening since before pollen count was high. Not feeling short of breath or wheezy.   When he eats, then ruq discomfort goes away and he can stand up without difficulty.   Elevated PSA 11/10/19; last visit with them was 12/2019. Large prostate and left renal cyst noted on Ct ordered by them.   No Known Allergies Current Meds  Medication Sig  . Blood Pressure Monitoring (ADULT BLOOD PRESSURE CUFF LG) KIT 1 Device by Does not apply route daily.     Review of Systems  Constitutional: Negative for chills, fatigue and fever.  Respiratory: Negative for cough, chest tightness, shortness of breath and wheezing.   Cardiovascular: Negative for chest pain, palpitations and leg swelling.  Musculoskeletal: Positive for back pain and gait problem.    Objective:  BP 120/80 (BP Location: Left Arm, Patient Position: Sitting, Cuff Size: Large)   Pulse (!) 49   Temp 98.2 F (36.8 C) (Oral)   Ht _0  (1.905 m)   Wt 229 lb 6.4 oz (104.1 kg)   BMI 28.67 kg/m   Weight: 229 lb 6.4 oz (104.1 kg)   BP Readings from Last 3 Encounters:  02/01/21 120/80  05/06/20 128/80  11/06/19 110/70   Wt Readings from Last 3 Encounters:  02/01/21 229 lb 6.4 oz (104.1 kg)  05/06/20 219 lb 1.6 oz (99.4 kg)  11/06/19 210 lb (95.3 kg)    Physical Exam Constitutional:      General: He is not in acute distress.    Appearance: He is well-developed.  Cardiovascular:     Rate and Rhythm: Normal rate and regular rhythm.     Heart sounds: Normal heart sounds. No murmur heard. No friction rub.  Pulmonary:     Effort: Pulmonary effort is normal. No respiratory distress.     Breath sounds: Normal breath sounds. No wheezing or rales.  Musculoskeletal:     Right lower leg: No edema.     Left lower leg: No edema.     Comments: Having to push self up to stand up from seated position. Spasm hip flexors left; limited external rotation. Limited internal rotation of bilateral hips.   Neurological:     Mental Status: He is alert and oriented to person, place, and time.  Psychiatric:        Behavior: Behavior normal.     Assessment/Plan  1. Low back pain without sciatica, unspecified back pain laterality, unspecified chronicity - DG Lumbar Spine Complete; Future - Ambulatory referral to Sports Medicine  2. Hip pain - Sedimentation rate; Future - XR HIPS BILAT W OR W/O PELVIS 2V; Future - CBC with Differential/Platelet; Future - Ambulatory referral to  Sports Medicine  3. Elevated PSA - PSA; Future  4. Lipid screening - Lipid panel; Future  5. Screening for diabetes mellitus - Comprehensive metabolic panel; Future    Return for pending imaging, labs.  *discuss sinus drainage after imaging 40 minutes spent in chart review, charting, exam, time with patient.    Micheline Rough, MD

## 2021-02-01 ENCOUNTER — Ambulatory Visit (INDEPENDENT_AMBULATORY_CARE_PROVIDER_SITE_OTHER)
Admission: RE | Admit: 2021-02-01 | Discharge: 2021-02-01 | Disposition: A | Discharge status: Home / Self Care | Payer: Federal, State, Local not specified - PPO | Source: Ambulatory Visit | Attending: Family Medicine | Admitting: Family Medicine

## 2021-02-01 ENCOUNTER — Encounter: Payer: Self-pay | Admitting: Family Medicine

## 2021-02-01 ENCOUNTER — Ambulatory Visit: Payer: Federal, State, Local not specified - PPO | Admitting: Family Medicine

## 2021-02-01 VITALS — BP 120/80 | HR 49 | Temp 98.2°F | Ht 75.0 in | Wt 229.4 lb

## 2021-02-01 DIAGNOSIS — R059 Cough, unspecified: Secondary | ICD-10-CM

## 2021-02-01 DIAGNOSIS — M545 Low back pain, unspecified: Secondary | ICD-10-CM | POA: Diagnosis not present

## 2021-02-01 DIAGNOSIS — Z131 Encounter for screening for diabetes mellitus: Secondary | ICD-10-CM | POA: Diagnosis not present

## 2021-02-01 DIAGNOSIS — M25559 Pain in unspecified hip: Secondary | ICD-10-CM | POA: Diagnosis not present

## 2021-02-01 DIAGNOSIS — R1030 Lower abdominal pain, unspecified: Secondary | ICD-10-CM | POA: Diagnosis not present

## 2021-02-01 DIAGNOSIS — R972 Elevated prostate specific antigen [PSA]: Secondary | ICD-10-CM | POA: Diagnosis not present

## 2021-02-01 DIAGNOSIS — M160 Bilateral primary osteoarthritis of hip: Secondary | ICD-10-CM | POA: Diagnosis not present

## 2021-02-01 DIAGNOSIS — Z13220 Encounter for screening for lipoid disorders: Secondary | ICD-10-CM

## 2021-02-01 LAB — CBC WITH DIFFERENTIAL/PLATELET
Basophils Absolute: 0.1 10*3/uL (ref 0.0–0.1)
Basophils Relative: 1.4 % (ref 0.0–3.0)
Eosinophils Absolute: 0.5 10*3/uL (ref 0.0–0.7)
Eosinophils Relative: 11.7 % — ABNORMAL HIGH (ref 0.0–5.0)
HCT: 42.2 % (ref 39.0–52.0)
Hemoglobin: 13.9 g/dL (ref 13.0–17.0)
Lymphocytes Relative: 39.7 % (ref 12.0–46.0)
Lymphs Abs: 1.6 10*3/uL (ref 0.7–4.0)
MCHC: 32.8 g/dL (ref 30.0–36.0)
MCV: 69 fl — ABNORMAL LOW (ref 78.0–100.0)
Monocytes Absolute: 0.4 10*3/uL (ref 0.1–1.0)
Monocytes Relative: 8.8 % (ref 3.0–12.0)
Neutro Abs: 1.6 10*3/uL (ref 1.4–7.7)
Neutrophils Relative %: 38.4 % — ABNORMAL LOW (ref 43.0–77.0)
Platelets: 131 10*3/uL — ABNORMAL LOW (ref 150.0–400.0)
RBC: 6.12 Mil/uL — ABNORMAL HIGH (ref 4.22–5.81)
RDW: 15.9 % — ABNORMAL HIGH (ref 11.5–15.5)
WBC: 4.1 10*3/uL (ref 4.0–10.5)

## 2021-02-01 LAB — COMPREHENSIVE METABOLIC PANEL
ALT: 11 U/L (ref 0–53)
AST: 16 U/L (ref 0–37)
Albumin: 3.9 g/dL (ref 3.5–5.2)
Alkaline Phosphatase: 68 U/L (ref 39–117)
BUN: 14 mg/dL (ref 6–23)
CO2: 26 mEq/L (ref 19–32)
Calcium: 8.9 mg/dL (ref 8.4–10.5)
Chloride: 110 mEq/L (ref 96–112)
Creatinine, Ser: 0.82 mg/dL (ref 0.40–1.50)
GFR: 93.74 mL/min (ref >60.00)
Glucose, Bld: 88 mg/dL (ref 70–99)
Potassium: 4.3 mEq/L (ref 3.5–5.1)
Sodium: 141 mEq/L (ref 135–145)
Total Bilirubin: 0.5 mg/dL (ref 0.2–1.2)
Total Protein: 6.5 g/dL (ref 6.0–8.3)

## 2021-02-01 LAB — LIPID PANEL
Cholesterol: 164 mg/dL (ref 0–200)
HDL: 49.6 mg/dL (ref >39.00)
LDL Cholesterol: 106 mg/dL — ABNORMAL HIGH (ref 0–99)
NonHDL: 114.13
Total CHOL/HDL Ratio: 3
Triglycerides: 39 mg/dL (ref 0.0–149.0)
VLDL: 7.8 mg/dL (ref 0.0–40.0)

## 2021-02-01 LAB — PSA: PSA: 5.4 ng/mL — ABNORMAL HIGH (ref 0.10–4.00)

## 2021-02-01 LAB — SEDIMENTATION RATE: Sed Rate: 9 mm/hr (ref 0–20)

## 2021-02-01 NOTE — Addendum Note (Signed)
Addended by: Elmer Picker on: 02/01/2021 09:59 AM   Modules accepted: Orders

## 2021-02-07 ENCOUNTER — Encounter: Payer: Self-pay | Admitting: Family Medicine

## 2021-02-09 NOTE — Telephone Encounter (Signed)
The patient called to speak with [facility]

## 2021-02-09 NOTE — Telephone Encounter (Signed)
See results note. 

## 2021-02-15 NOTE — Progress Notes (Signed)
[place] Phone: [phone] Subjective:   Michael [name], am serving as a scribe for Dr. Hulan Saas. This visit occurred during the SARS-CoV-2 public health emergency.  Safety protocols were in place, including screening questions prior to the visit, additional usage of staff PPE, and extensive cleaning of exam room while observing appropriate contact time as indicated for disinfecting solutions.   I'm seeing this patient by the request  of:  Koberlein, Steele Berg, MD  CC: Low back pain  [id number]  Michael [name] is a 65 y.o. male coming in with complaint of low back pain that began in December of 2020. Patient has relief from his back pain after eating. Has a hard time standing up due to pain. Has hard time sleeping due to pain. Feels tightness in anterior hips bilaterally. At times, he feels that he is dragging his left leg. Uses showers for pain relief. Unable to run or jump rope. Patient feels decrease in ROM in lumbar spine when he reaches down to L leg but is able to reach down to R leg with greater ease. Has hard time lacing shoes and putting on pants. Also complains of weakness in legs when trying to put on pants.   Physical therapy has provided temporary relief of his symptoms.   Xray Lumbar 02/01/2021 Mild multilevel degenerative disc disease. [name] acute abnormality seen in the lumbar spine  Xray hips 02/01/2021 IMPRESSION: Mild joint space narrowing both hips left greater than right.  Patient states he did have a CT scan when he was seen by urology.  This was in March 2021.  Found to have a large prostate and a left renal cyst noted.  Elevated PSA of 5 noted on the last exam which is down from 8.  Had laboratory work-up by primary care that did show an once again elevated PSA but normal sedimentation rate patient does have what appears to be a low MCV but seems to be stable for quite some time as well  as an elevated eosinophils.    [name] past medical history on file. Past Surgical History:  Procedure Laterality Date  . [place] SURGERY  2014  . right ankle fracture  1978   external pinning   Social History   Socioeconomic History  . Marital status: Single    Spouse name: Not on file  . Number of children: Not on file  . Years of education: Not on file  . Highest education level: Not on file  Occupational History  . Not on file  Tobacco Use  . Smoking status: Never Smoker  . Smokeless tobacco: Never Used  Substance and Sexual Activity  . Alcohol use: Yes    Comment: rare  . Drug use: [name]  . Sexual activity: Yes  Other Topics Concern  . Not on file  Social History Narrative  . Not on file   Social Determinants of Health   Financial Resource Strain: Not on file  Food Insecurity: Not on file  Transportation Needs: Not on file  Physical Activity: Not on file  Stress: Not on file  Social Connections: Not on file   [name] Known Allergies Family History  Problem Relation Age of Onset  . High blood pressure Mother     Current Outpatient Medications (Endocrine & Metabolic):  .  predniSONE (DELTASONE) 20 MG tablet, Take 1 tablet (20 mg total) by mouth daily with breakfast.      Current  Outpatient Medications (Other):  .  gabapentin (NEURONTIN) 100 MG capsule, Take 2 capsules (200 mg total) by mouth at bedtime. .  Blood Pressure Monitoring (ADULT BLOOD PRESSURE CUFF LG) KIT, 1 Device by Does not apply route daily.   Reviewed prior external information including notes and imaging from  primary care provider As well as notes that were available from care everywhere and other healthcare systems.  Past medical history, social, surgical and family history all reviewed in electronic medical record.  [name] pertanent information unless stated regarding to the chief complaint.   Review of Systems:  [name] headache, visual changes, nausea, vomiting, diarrhea, constipation,  dizziness, abdominal pain, skin rash, fevers, chills, night sweats, weight loss, swollen lymph nodes,, joint swelling, chest pain, shortness of breath, mood changes. POSITIVE muscle aches, body aches  Objective  Blood pressure 118/88, pulse (!) 49, height 6' 3" (1.905 m), weight 230 lb (104.3 kg), SpO2 99 %.   General: [name] apparent distress alert and oriented x3 mood and affect normal, dressed appropriately.  HEENT: Pupils equal, extraocular movements intact  Respiratory: Patient's speak in full sentences and does not appear short of breath  Cardiovascular: [name] lower extremity edema, non tender, [name] erythema  Gait small shuffling gait MSK:   Significant loss of lordosis.  Patient does have significant tightness of the lumbar spine.  Patient has less than 5 degrees of extension of the back with severe pain with some radicular symptoms down both legs.  Straight leg test shows significant tightness of the hamstrings.  Patient also has tightness with external range of motion of the hips that is fairly severe.  Deep tendon reflexes only show 1+ of the Achilles but symmetric.  Patient does appear to be neurovascularly intact.    Impression and Recommendations:     The above documentation has been reviewed and is accurate and complete [name], DO

## 2021-02-16 ENCOUNTER — Encounter: Payer: Self-pay | Admitting: Family Medicine

## 2021-02-16 ENCOUNTER — Ambulatory Visit (INDEPENDENT_AMBULATORY_CARE_PROVIDER_SITE_OTHER): Payer: Federal, State, Local not specified - PPO | Admitting: Family Medicine

## 2021-02-16 ENCOUNTER — Other Ambulatory Visit: Payer: Self-pay

## 2021-02-16 VITALS — BP 118/88 | HR 49 | Ht 75.0 in | Wt 230.0 lb

## 2021-02-16 DIAGNOSIS — M545 Low back pain, unspecified: Secondary | ICD-10-CM | POA: Diagnosis not present

## 2021-02-16 DIAGNOSIS — G8929 Other chronic pain: Secondary | ICD-10-CM

## 2021-02-16 DIAGNOSIS — M25551 Pain in right hip: Secondary | ICD-10-CM

## 2021-02-16 DIAGNOSIS — M25552 Pain in left hip: Secondary | ICD-10-CM

## 2021-02-16 DIAGNOSIS — M5442 Lumbago with sciatica, left side: Secondary | ICD-10-CM

## 2021-02-16 DIAGNOSIS — M5441 Lumbago with sciatica, right side: Secondary | ICD-10-CM

## 2021-02-16 MED ORDER — GABAPENTIN 100 MG PO CAPS: 200.0000 mg | ORAL_CAPSULE | Freq: Every day | ORAL | 0 refills | Status: DC

## 2021-02-16 MED ORDER — PREDNISONE 20 MG PO TABS: 20.0000 mg | ORAL_TABLET | Freq: Every day | ORAL | 0 refills | Status: DC

## 2021-02-16 NOTE — Addendum Note (Signed)
Addended by: Carmie Kanner on: 02/16/2021 04:28 PM   Modules accepted: Orders

## 2021-02-16 NOTE — Assessment & Plan Note (Signed)
Patient does have low back pain. Patient did not discuss radicular symptoms but on exam today with any type of extension had worsening pain.  Patient has significant stiffness noted.  Patient's deep tendon reflexes are down to 1+ of the Achilles bilaterally.  Patient's pain seems to be significantly out of proportion.  Do believe that patient does need advanced imaging at this time.  Does have a history of surgery.  Concern for either adjacent segment disease that is causing more of severe spinal stenosis.  We will add MRI pelvis without contrast as well to further evaluate with patient having enlargement of the prostate.  Patient's sedimentation rate is encouraging.  Follow-up with me after imaging to discuss other treatment.  Short course of prednisone given as well as gabapentin given for nighttime relief and radicular symptoms.

## 2021-02-16 NOTE — Patient Instructions (Addendum)
Prednisone 20mg  daily for 5 days Gabapentin 200mg  at night MRI lumbar  And pelvis- [facility] [phone] Lets get notes from [facility] See me again in 5-6 weeks

## 2021-02-20 ENCOUNTER — Telehealth: Payer: Self-pay | Admitting: Family Medicine

## 2021-02-20 NOTE — Telephone Encounter (Signed)
Pt wants to confirm: he thought he was getting MRI of pelvic and lumbar but order is just for pelvic. Does he need another lumbar MRI or are we using the one he had in March from [facility].

## 2021-02-20 NOTE — Telephone Encounter (Signed)
Spoke with patient that we will just need pelvis MRI and lumbar spine MRI was from March of this year.

## 2021-02-27 ENCOUNTER — Telehealth: Payer: Self-pay

## 2021-02-27 NOTE — Telephone Encounter (Signed)
Patient called stating that he was at the garden shop and he was leaning over to look at flowers and when he went to stand back up straight there was a horrible burning pain down his left leg and then it went numb. Patient states he had a very hard time leaving the store. Patient states that he is getting his MRI next Monday of his Pelvis but is wanting to know if there is anything he can take or do in the mean time to help with this pain.

## 2021-02-28 NOTE — Telephone Encounter (Signed)
Left patient message

## 2021-02-28 NOTE — Telephone Encounter (Signed)
He could bump the gabapentin up to 300mg  at night and see if that helps.  If things have changed we can consider repeating the MRI of lumbar but maybe wait to see what the pelvis shows.  Worsening pain then needs top go to ER

## 2021-03-06 ENCOUNTER — Ambulatory Visit
Admission: RE | Admit: 2021-03-06 | Discharge: 2021-03-06 | Disposition: A | Discharge status: Home / Self Care | Payer: Federal, State, Local not specified - PPO | Source: Ambulatory Visit | Attending: Family Medicine | Admitting: Family Medicine

## 2021-03-06 DIAGNOSIS — M25551 Pain in right hip: Secondary | ICD-10-CM

## 2021-03-06 DIAGNOSIS — N400 Enlarged prostate without lower urinary tract symptoms: Secondary | ICD-10-CM | POA: Diagnosis not present

## 2021-03-06 DIAGNOSIS — M160 Bilateral primary osteoarthritis of hip: Secondary | ICD-10-CM | POA: Diagnosis not present

## 2021-03-07 ENCOUNTER — Encounter: Payer: Self-pay | Admitting: Family Medicine

## 2021-03-08 ENCOUNTER — Encounter: Payer: Self-pay | Admitting: Family Medicine

## 2021-03-08 ENCOUNTER — Other Ambulatory Visit: Payer: Self-pay

## 2021-03-08 DIAGNOSIS — M545 Low back pain, unspecified: Secondary | ICD-10-CM

## 2021-03-10 ENCOUNTER — Ambulatory Visit
Admission: RE | Admit: 2021-03-10 | Discharge: 2021-03-10 | Disposition: A | Discharge status: Home / Self Care | Payer: Federal, State, Local not specified - PPO | Source: Ambulatory Visit | Attending: Family Medicine | Admitting: Family Medicine

## 2021-03-10 ENCOUNTER — Other Ambulatory Visit: Payer: Self-pay

## 2021-03-10 DIAGNOSIS — M47817 Spondylosis without myelopathy or radiculopathy, lumbosacral region: Secondary | ICD-10-CM | POA: Diagnosis not present

## 2021-03-10 DIAGNOSIS — M545 Low back pain, unspecified: Secondary | ICD-10-CM

## 2021-03-10 MED ORDER — IOPAMIDOL (ISOVUE-M 200) INJECTION 41%
1.0000 mL | Freq: Once | INTRAMUSCULAR | Status: AC
  Administered 2021-03-10: 1 mL via EPIDURAL

## 2021-03-10 MED ORDER — METHYLPREDNISOLONE ACETATE 40 MG/ML INJ SUSP (RADIOLOG
80.0000 mg | Freq: Once | INTRAMUSCULAR | Status: AC
  Administered 2021-03-10: 80 mg via EPIDURAL

## 2021-03-10 NOTE — Discharge Instructions (Signed)

## 2021-03-29 NOTE — Progress Notes (Signed)
[place] Phone: [phone] Subjective:   Michael Michael [name], am serving as a scribe for Dr. Hulan [name]. This visit occurred during the SARS-CoV-2 public health emergency.  Safety protocols were in place, including screening questions prior to the visit, additional usage of staff PPE, and extensive cleaning of exam room while observing appropriate contact time as indicated for disinfecting solutions.   I'm seeing this patient by the request  of:  Michael [name], Michael Berg, MD  CC: Back pain and hip pain follow-up  [id number]   02/16/2021 Patient does have low back pain. Patient did not discuss radicular symptoms but on exam today with any type of extension had worsening pain.  Patient has significant stiffness noted.  Patient's deep tendon reflexes are down to 1+ of the Achilles bilaterally.  Patient's pain seems to be significantly out of proportion.  Do believe that patient does need advanced imaging at this time.  Does have a history of surgery.  Concern for either adjacent segment disease that is causing more of severe spinal stenosis.  We will add MRI pelvis without contrast as well to further evaluate with patient having enlargement of the prostate.  Patient's sedimentation rate is encouraging.  Follow-up with me after imaging to discuss other treatment.  Short course of prednisone given as well   Update 03/30/2021 Michael Michael [name] is a 65 y.o. male coming in with complaint of B hip and LBP. Epidural on 03/10/2021. Patient states that he did have some relief from the epidural. Able to flex trunk with less pain. When sitting for a long period of time he does still get stiff. Movement improves his symptoms. Has noticed that he has been able to sneeze with less pain. Notices that he still leans to the right.   MRI Pelvis 03/06/2021 IMPRESSION: 1. Mild bilateral hip joint degenerative changes but Michael [name] stress fracture or  AVN. 2. Intact bony pelvis. 3. Bilateral hamstring tendinopathy, right greater than left. 4. Michael [name] significant intrapelvic abnormalities.       Michael [name] past medical history on file. Past Surgical History:  Procedure Laterality Date  . [place] SURGERY  2014  . right ankle fracture  1978   external pinning   Social History   Socioeconomic History  . Marital status: Single    Spouse name: Not on file  . Number of children: Not on file  . Years of education: Not on file  . Highest education level: Not on file  Occupational History  . Not on file  Tobacco Use  . Smoking status: Never Smoker  . Smokeless tobacco: Never Used  Substance and Sexual Activity  . Alcohol use: Yes    Comment: rare  . Drug use: Michael [name]  . Sexual activity: Yes  Other Topics Concern  . Not on file  Social History Narrative  . Not on file   Social Determinants of Health   Financial Resource Strain: Not on file  Food Insecurity: Not on file  Transportation Needs: Not on file  Physical Activity: Not on file  Stress: Not on file  Social Connections: Not on file   Michael [name] Known Allergies Family History  Problem Relation Age of Onset  . High blood pressure Mother     Current Outpatient Medications (Endocrine & Metabolic):  .  predniSONE (DELTASONE) 20 MG tablet, Take 1 tablet (20 mg total) by mouth daily with breakfast.      Current Outpatient Medications (Other):  .  Blood Pressure Monitoring (ADULT BLOOD PRESSURE CUFF LG) KIT, 1 Device by Does not apply route daily. Marland Kitchen  gabapentin (NEURONTIN) 100 MG capsule, Take 2 capsules (200 mg total) by mouth at bedtime.   Reviewed prior external information including notes and imaging from  Previous office visits and in the interim since last visit.  This included the MRI as well as the epidurals. As well as notes that were available from care everywhere and other healthcare systems.  Past medical history, social, surgical and family history all reviewed in  electronic medical record.  Michael [name] pertanent information unless stated regarding to the chief complaint.   Review of Systems:  Michael [name] headache, visual changes, nausea, vomiting, diarrhea, constipation, dizziness, abdominal pain, skin rash, fevers, chills, night sweats, weight loss, swollen lymph nodes, , joint swelling, chest pain, shortness of breath, mood changes. POSITIVE muscle aches, body aches  Objective  Blood pressure 130/80, pulse (!) 50, height 6' 3"  (1.905 m), weight 223 lb (101.2 kg), SpO2 95 %.   General: Michael [name] apparent distress alert and oriented x3 mood and affect normal, dressed appropriately.  HEENT: Pupils equal, extraocular movements intact  Respiratory: Patient's speak in full sentences and does not appear short of breath  Cardiovascular: Michael [name] lower extremity edema, non tender, Michael [name] erythema  Gait normal with good balance and coordination.  MSK: Patient does look better at this time.  Still has tightness some more on the right side of the paraspinal musculature of the lumbar spine.  Improvement though in flexion from previous exam but still difficulty with anything greater than 5 degrees of extension.  Significant tightness of the hamstring still noted.  Deep tendon reflexes though do seem to be better.  Neurovascularly intact distally.    Impression and Recommendations:     The above documentation has been reviewed and is accurate and complete Michael Pulley, DO

## 2021-03-30 ENCOUNTER — Ambulatory Visit: Payer: Federal, State, Local not specified - PPO | Admitting: Family Medicine

## 2021-03-30 ENCOUNTER — Other Ambulatory Visit: Payer: Self-pay

## 2021-03-30 ENCOUNTER — Encounter: Payer: Self-pay | Admitting: Family Medicine

## 2021-03-30 VITALS — BP 130/80 | HR 50 | Ht 75.0 in | Wt 223.0 lb

## 2021-03-30 DIAGNOSIS — M5442 Lumbago with sciatica, left side: Secondary | ICD-10-CM | POA: Diagnosis not present

## 2021-03-30 DIAGNOSIS — M5441 Lumbago with sciatica, right side: Secondary | ICD-10-CM

## 2021-03-30 DIAGNOSIS — G8929 Other chronic pain: Secondary | ICD-10-CM

## 2021-03-30 NOTE — Assessment & Plan Note (Signed)
Improvement noted after the epidural.  Patient did seem to do better with this.  The patient does have some mild hamstring tendinopathy bilaterally on MRI but I do not think that this is consistent with his pain.  Still has tightness in the parascapular and paraspinal musculature of the lumbar spine.  Continue the gabapentin.  Will refer patient to physical therapy with aquatic therapy to help with decreasing stress on his joints follow-up with me again in 6 to 8 weeks.  Total time reviewing patient's chart and talking to patient 33 minutes

## 2021-03-30 NOTE — Patient Instructions (Signed)
Aquatic PT at [facility] gabapentin Keep trying to be active  Let's check in 6-8 weeks from now

## 2021-04-13 ENCOUNTER — Encounter (HOSPITAL_BASED_OUTPATIENT_CLINIC_OR_DEPARTMENT_OTHER): Payer: Self-pay | Admitting: Physical Therapy

## 2021-04-13 ENCOUNTER — Ambulatory Visit (HOSPITAL_BASED_OUTPATIENT_CLINIC_OR_DEPARTMENT_OTHER): Payer: Federal, State, Local not specified - PPO | Attending: Family Medicine | Admitting: Physical Therapy

## 2021-04-13 ENCOUNTER — Other Ambulatory Visit: Payer: Self-pay

## 2021-04-13 DIAGNOSIS — M545 Low back pain, unspecified: Secondary | ICD-10-CM | POA: Diagnosis not present

## 2021-04-13 DIAGNOSIS — R293 Abnormal posture: Secondary | ICD-10-CM | POA: Diagnosis not present

## 2021-04-13 DIAGNOSIS — M6281 Muscle weakness (generalized): Secondary | ICD-10-CM | POA: Insufficient documentation

## 2021-04-13 DIAGNOSIS — G8929 Other chronic pain: Secondary | ICD-10-CM | POA: Diagnosis not present

## 2021-04-13 DIAGNOSIS — M62830 Muscle spasm of back: Secondary | ICD-10-CM | POA: Insufficient documentation

## 2021-04-14 ENCOUNTER — Encounter (HOSPITAL_BASED_OUTPATIENT_CLINIC_OR_DEPARTMENT_OTHER): Payer: Self-pay | Admitting: Physical Therapy

## 2021-04-14 NOTE — Therapy (Signed)
[place], [place], [address] Phone: [phone]   Fax:  [phone]  Physical Therapy Evaluation  Patient Details  Name: Michael [name] MRN: [id number] Date of Birth: [birth year] Referring Provider (PT): Dr Charlann Boxer   Encounter Date: 04/13/2021   PT End of Session - 04/14/21 1020     Visit Number 1    Number of Visits 12    Date for PT Re-Evaluation 05/26/21    Authorization Type BCBS    PT Start Time 0845    PT Stop Time 0929    PT Time Calculation (min) 44 min    Activity Tolerance Patient tolerated treatment well    Behavior During Therapy [facility] for tasks assessed/performed             History reviewed. No pertinent past medical history.  Past Surgical History:  Procedure Laterality Date   LUMBAR [place] SURGERY  2014   right ankle fracture  1978   external pinning    There were no vitals filed for this visit.    Subjective Assessment - 04/13/21 0901     Subjective Patient has a long history of lower back pain. Since the beggining of the year he was having increased pain when he sneezes and coughs. He is also having increased pain bending over to put his shoes and his pants on. He is also noticing that his balance is decreased.    Pertinent History nothing    How long can you sit comfortably? stiffens when he sits    How long can you stand comfortably? prior to the epidural he had pain    Currently in Pain? Yes    Pain Score 6     Pain Location Back    Pain Orientation Right    Pain Descriptors / Indicators Aching    Pain Type Chronic pain    Pain Onset More than a month ago    Pain Frequency Intermittent    Aggravating Factors  sit to stand transfer; coughing, sneezing, bending over    Pain Relieving Factors epidural helped    Multiple Pain Sites No                [facility] PT Assessment - 04/14/21 0001       Assessment   Medical Diagnosis LOw Back Pain    Referring  Provider (PT) Dr Charlann Boxer    Onset Date/Surgical Date --   Several years   Hand Dominance Right    Next MD Visit July 20th    Prior Therapy Had therapy for his back in 2021 with improvement      Precautions   Precautions Back    Precaution Comments L5 Si Hemilaminectomy      Restrictions   Weight Bearing Restrictions No      Balance Screen   Has the patient fallen in the past 6 months No    Has the patient had a decrease in activity level because of a fear of falling?  No    Is the patient reluctant to leave their home because of a fear of falling?  No      Home Ecologist residence    Additional Comments None in the house      Prior Function   Level of [place] Retired      Associate Professor   Overall Cognitive Status Within Functional Limits for tasks assessed    Attention  Focused    Focused Attention Appears intact    Memory Appears intact    Awareness Appears intact    Problem Solving Appears intact      Sensation   Light Touch Appears Intact    Additional Comments Nothing at this time      Coordination   Gross Motor Movements are Fluid and Coordinated Yes    Fine Motor Movements are Fluid and Coordinated Yes      Functional Tests   Functional tests Single leg stance      Posture/Postural Control   Posture Comments Stands with trunk flexion; leans to the right.      AROM   Lumbar Flexion 20-50 shifts away from the right side    Lumbar Extension unable to come to neutral    Lumbar - Right Rotation mild pain    Lumbar - Left Rotation no pain      PROM   Overall PROM Comments Unable to bring hip internal rotation to neutral      Strength   Right Hip Flexion 4/5    Right Hip ABduction 4/5    Left Hip Flexion 5/5    Left Hip ABduction 5/5    Left Hip ADduction 5/5      Palpation   Palpation comment spasming in the right gluteal      Ambulation/Gait   Gait Comments walks leaneing to the right with  trunk flexion                        Objective measurements completed on examination: See above findings.       [facility] Adult PT Treatment/Exercise - 04/14/21 0001       Lumbar Exercises: Stretches   Lower Trunk Rotation Limitations x20    Piriformis Stretch Limitations x20    Other Lumbar Stretch Exercise trigger point release to lumbar spien with tennis ball                    PT Education - 04/14/21 1020     Education Details HEP, symtpom mangement    Person(s) Educated Patient    Methods Explanation;Demonstration;Tactile cues;Verbal cues    Comprehension Verbalized understanding;Returned demonstration;Verbal cues required;Tactile cues required              PT Short Term Goals - 04/14/21 1217       PT SHORT TERM GOAL #1   Title Pt will be ind with initial HEP    Time 3    Period Weeks    Status New    Target Date 05/05/21      PT SHORT TERM GOAL #2   Title Patient will stand with spine in neutral without increased pain    Time 3    Period Weeks    Status New    Target Date 05/05/21      PT SHORT TERM GOAL #3   Title Patient will deomnstrate 5/5 gross LE strength    Time 3    Period Weeks    Status New    Target Date 05/05/21                       Plan - 04/14/21 1159     Clinical Impression Statement Patient is a 65 year old male with a hisotry of lower back pain. He has been progressing over thepast few months. The pain has improved since recieivng a cortisone shot. He has increased  pain when he coughs and sneezes. He stands in 20 degrees of flexion and leans to the right. He has tightness in his quadratus and right lumbar paraspinals. He has difficutly bending over particularly to the left. He would benefit from skilled therapy to increase in his posture and to improve ability to stand and walk.    Personal Factors and Comorbidities Time since onset of injury/illness/exacerbation    Examination-Activity Limitations  Bed Mobility;Sit;Squat;Stairs;Stand    Examination-Participation Restrictions Community Activity;Shop;Church    Stability/Clinical Decision Making Stable/Uncomplicated    Clinical Decision Making Low    Rehab Potential Good    PT Frequency 2x / week    PT Duration 6 weeks    PT Treatment/Interventions ADLs/Self Care Home Management;Electrical Stimulation;Iontophoresis 4mg /ml Dexamethasone;Ultrasound;Gait training;Stair training;Functional mobility training;Therapeutic activities;Therapeutic exercise;Neuromuscular re-education;Patient/family education;Manual techniques;Dry needling;Passive range of motion;Taping    PT Next Visit Plan manual therapt to correct shift to the right. manual therapy to lumbar paraspinals; needling to paraspinals. review base core strengthening exercises    PT Home Exercise Plan Access Code: [email or web address]  URL: [email or web address]  Date: 04/14/2021  Prepared by: Carolyne Littles    Exercises  Supine Piriformis Stretch with Foot on Ground - 2 x daily - 7 x weekly - 3 sets - 3 reps - 20 hold  Hooklying Single Knee to Chest Stretch - 2 x daily - 7 x weekly - 3 sets - 3 reps - 20 hold  Standing Glute Med Mobilization with Small Ball on Wall - 1 x daily - 7 x weekly - 3 sets - 10 reps    Consulted and Agree with Plan of Care Patient             Patient will benefit from skilled therapeutic intervention in order to improve the following deficits and impairments:  Abnormal gait, Decreased knowledge of use of DME, Pain, Increased fascial restricitons, Decreased mobility, Increased muscle spasms, Decreased activity tolerance, Decreased range of motion, Decreased strength, Difficulty walking  Visit Diagnosis: Muscle weakness (generalized)  Chronic right-sided low back pain without sciatica  Abnormal posture  Muscle spasm of back     Problem List Patient Active Problem List   Diagnosis Date Noted   Low back pain 02/16/2021   Preventive measure 12/18/2012    Stress and adjustment reaction 12/18/2012    Carney Living PT DPT 04/14/2021, 12:45 PM  [facility] [address] [place], [place], [address] Phone: [phone]   Fax:  [phone]  Name: Deontae Robson MRN: [id number] Date of Birth: Apr 29, 1956

## 2021-04-20 ENCOUNTER — Ambulatory Visit (HOSPITAL_BASED_OUTPATIENT_CLINIC_OR_DEPARTMENT_OTHER): Payer: Federal, State, Local not specified - PPO | Admitting: Physical Therapy

## 2021-04-20 ENCOUNTER — Other Ambulatory Visit: Payer: Self-pay

## 2021-04-20 DIAGNOSIS — M6281 Muscle weakness (generalized): Secondary | ICD-10-CM

## 2021-04-20 DIAGNOSIS — M62830 Muscle spasm of back: Secondary | ICD-10-CM

## 2021-04-20 DIAGNOSIS — R293 Abnormal posture: Secondary | ICD-10-CM

## 2021-04-20 DIAGNOSIS — G8929 Other chronic pain: Secondary | ICD-10-CM | POA: Diagnosis not present

## 2021-04-20 DIAGNOSIS — M545 Low back pain, unspecified: Secondary | ICD-10-CM | POA: Diagnosis not present

## 2021-04-21 ENCOUNTER — Encounter (HOSPITAL_BASED_OUTPATIENT_CLINIC_OR_DEPARTMENT_OTHER): Payer: Self-pay | Admitting: Physical Therapy

## 2021-04-21 NOTE — Therapy (Signed)
[place], [place], [address] Phone: [phone]   Fax:  [phone]  Physical Therapy Treatment  Patient Details  Name: Bedford Winsor MRN: [id number] Date of Birth: [birth year] Referring Provider (PT): Dr Charlann Boxer   Encounter Date: 04/20/2021   PT End of Session - 04/21/21 1119     Visit Number 2    Number of Visits 12    Date for PT Re-Evaluation 05/26/21    Authorization Type BCBS    PT Start Time 1400    PT Stop Time 3875    PT Time Calculation (min) 45 min    Activity Tolerance Patient tolerated treatment well    Behavior During Therapy [facility] for tasks assessed/performed             History reviewed. No pertinent past medical history.  Past Surgical History:  Procedure Laterality Date   LUMBAR [place] SURGERY  2014   right ankle fracture  1978   external pinning    There were no vitals filed for this visit.   Subjective Assessment - 04/21/21 0958     Subjective Patient reports his bac is about the same. He feels like something is pulling him down on the right side. He comes in today leaning forward and to the right.    Pertinent History nothing    How long can you sit comfortably? stiffens when he sits    How long can you stand comfortably? prior to the epidural he had pain    Currently in Pain? Yes    Pain Score 5     Pain Location Back    Pain Orientation Right    Pain Descriptors / Indicators Aching    Pain Type Chronic pain    Pain Onset More than a month ago    Pain Frequency Intermittent    Aggravating Factors  sit to stand transfer, coughing, sneezing, bending over                               [facility] Adult PT Treatment/Exercise - 04/21/21 0001       Lumbar Exercises: Stretches   Lower Trunk Rotation Limitations x20    Piriformis Stretch Limitations 3x20 sec hold      Lumbar Exercises: Supine   Other Supine Lumbar Exercises 1/4 bridge 2x10;  clamshell2x10; reviewed abdominal breathing      Manual Therapy   Manual Therapy Soft tissue mobilization;Joint mobilization    Manual therapy comments skilled palpation of trigger points    Joint Mobilization grade III and IV    Soft tissue mobilization IASTYM to QL lower lumbar paraspianls                    PT Education - 04/21/21 1119     Education Details benefits and risk of TPDN    Person(s) Educated Patient    Methods Explanation;Demonstration;Tactile cues;Verbal cues    Comprehension Verbalized understanding;Returned demonstration;Verbal cues required;Tactile cues required              PT Short Term Goals - 04/14/21 1217       PT SHORT TERM GOAL #1   Title Pt will be ind with initial HEP    Time 3    Period Weeks    Status New    Target Date 05/05/21      PT SHORT TERM GOAL #2   Title Patient will stand  with spine in neutral without increased pain    Time 3    Period Weeks    Status New    Target Date 05/05/21      PT SHORT TERM GOAL #3   Title Patient will deomnstrate 5/5 gross LE strength    Time 3    Period Weeks    Status New    Target Date 05/05/21                      Plan - 04/20/21 1440     Clinical Impression Statement Therapy needled the patients QL but did not get much aof a tetich response. We also needled theright sided paraspinals. He had a better twitch response with that. Therapy added a sink stretch and light core strengthening. he was not able to tolerate a full bridge. He was able to tolerate a 1/4 birdge. We will continue to progress as tolerated. He did have spasming with IATYM to the QL    Personal Factors and Comorbidities Time since onset of injury/illness/exacerbation    Examination-Activity Limitations Bed Mobility;Sit;Squat;Stairs;Stand    Stability/Clinical Decision Making Stable/Uncomplicated    Clinical Decision Making Low    Rehab Potential Good    PT Frequency 2x / week    PT Duration 6 weeks     PT Treatment/Interventions ADLs/Self Care Home Management;Electrical Stimulation;Iontophoresis 4mg /ml Dexamethasone;Ultrasound;Gait training;Stair training;Functional mobility training;Therapeutic activities;Therapeutic exercise;Neuromuscular re-education;Patient/family education;Manual techniques;Dry needling;Passive range of motion;Taping    PT Next Visit Plan manual therapt to correct shift to the right. manual therapy to lumbar paraspinals; needling to paraspinals. review base core strengthening exercises; review wall shift correction, ontinue with manual therapy.    PT Home Exercise Plan Access Code: [email or web address]  URL: [email or web address]  Date: 04/14/2021  Prepared by: Carolyne Littles    Exercises  Supine Piriformis Stretch with Foot on Ground - 2 x daily - 7 x weekly - 3 sets - 3 reps - 20 hold  Hooklying Single Knee to Chest Stretch - 2 x daily - 7 x weekly - 3 sets - 3 reps - 20 hold  Standing Glute Med Mobilization with Small Ball on Wall - 1 x daily - 7 x weekly - 3 sets - 10 reps    Consulted and Agree with Plan of Care Patient             Patient will benefit from skilled therapeutic intervention in order to improve the following deficits and impairments:  Abnormal gait, Decreased knowledge of use of DME, Pain, Increased fascial restricitons, Decreased mobility, Increased muscle spasms, Decreased activity tolerance, Decreased range of motion, Decreased strength, Difficulty walking  Visit Diagnosis: Muscle weakness (generalized)  Chronic right-sided low back pain without sciatica  Muscle spasm of back  Abnormal posture     Problem List Patient Active Problem List   Diagnosis Date Noted   Low back pain 02/16/2021   Preventive measure 12/18/2012   Stress and adjustment reaction 12/18/2012    Carney Living PT DPT  04/21/2021, 2:10 PM  [place] [address] [place], [place], [address] Phone: [phone]    Fax:  [phone]  Name: Nainoa Woldt MRN: [id number] Date of Birth: 1956/04/19

## 2021-04-27 ENCOUNTER — Ambulatory Visit (HOSPITAL_BASED_OUTPATIENT_CLINIC_OR_DEPARTMENT_OTHER): Payer: Federal, State, Local not specified - PPO | Admitting: Physical Therapy

## 2021-05-04 ENCOUNTER — Ambulatory Visit (HOSPITAL_BASED_OUTPATIENT_CLINIC_OR_DEPARTMENT_OTHER): Payer: Federal, State, Local not specified - PPO | Attending: Family Medicine | Admitting: Physical Therapy

## 2021-05-04 ENCOUNTER — Other Ambulatory Visit: Payer: Self-pay

## 2021-05-04 ENCOUNTER — Encounter (HOSPITAL_BASED_OUTPATIENT_CLINIC_OR_DEPARTMENT_OTHER): Payer: Self-pay | Admitting: Physical Therapy

## 2021-05-04 DIAGNOSIS — R293 Abnormal posture: Secondary | ICD-10-CM | POA: Insufficient documentation

## 2021-05-04 DIAGNOSIS — M545 Low back pain, unspecified: Secondary | ICD-10-CM | POA: Insufficient documentation

## 2021-05-04 DIAGNOSIS — G8929 Other chronic pain: Secondary | ICD-10-CM | POA: Diagnosis not present

## 2021-05-04 DIAGNOSIS — M6281 Muscle weakness (generalized): Secondary | ICD-10-CM | POA: Diagnosis not present

## 2021-05-04 DIAGNOSIS — M62830 Muscle spasm of back: Secondary | ICD-10-CM | POA: Diagnosis not present

## 2021-05-05 NOTE — Therapy (Signed)
[place], [place], [address] Phone: [phone]   Fax:  [phone]  Physical Therapy Treatment  Patient Details  Name: Michael [name] MRN: [id number] Date of Birth: 1956-06-03 Referring Provider (PT): Dr Charlann Boxer   Encounter Date: 05/04/2021   PT End of Session - 05/04/21 1053     Visit Number 3    Number of Visits 12    Date for PT Re-Evaluation 05/26/21    Authorization Type BCBS    PT Start Time 0805    PT Stop Time 0845    PT Time Calculation (min) 40 min    Activity Tolerance Patient tolerated treatment well    Behavior During Therapy [facility] for tasks assessed/performed             History reviewed. No pertinent past medical history.  Past Surgical History:  Procedure Laterality Date   LUMBAR [place] SURGERY  2014   right ankle fracture  1978   external pinning    There were no vitals filed for this visit.   Subjective Assessment - 05/04/21 0817     Subjective Patient worked on his car yesterday. his back is a little sore now. He took Gabapentin which helped the pain but it is still a bit sore this morning,    Pertinent History nothing    How long can you sit comfortably? stiffens when he sits    How long can you stand comfortably? prior to the epidural he had pain    Currently in Pain? Yes    Pain Score 5     Pain Location Back    Pain Orientation Right    Pain Descriptors / Indicators Aching    Pain Type Chronic pain    Pain Onset More than a month ago    Pain Frequency Intermittent    Aggravating Factors  working on his car    Pain Relieving Factors spidural    Multiple Pain Sites No                 Pt seen for aquatic therapy today.  Treatment took place in water 3.25-4 ft in depth at the [facility] pool. Temp of water was 91.  Pt entered/exited the pool via stairs (step through pattern) independently with bilat rail.  Introduction to water. Had  patient stand at different levels so she could feel the bouncy   Warm up: heel/toe walking x4 laps across pool chest deep side stepping x4 laps from shallow to deep;   Exercises; Slow march x20; hip 3 way x20; squats x20; hip extension x20; hip abduction x20; Sit to stand x20;  board trunk flexion x10 lateral board rotation x10 each way seated   With neck noddle and foot float; push pull x20; side to side perturbations from the feet x20; press and push x20  Steps step up x20 each leg; lateral step up x20 each leg with 3 lb weight   Noodle push and pull x20 with core breathing; noodle press x20 with core breathing  Hamstring stretch 3x20 sec hold  Piriformis stretch 3x20 sec hold   Standing side glide to right mod cuing for technique 10x 5 sec hold   Manual: Manual trigger point release to QL; grade 5 inferior manipulation in floated poistion    Pt requires buoyancy for support and to offload joints with strengthening exercises. Viscosity of the water is needed for resistance of strengthening; water current perturbations provides challenge to standing balance  unsupported, requiring increased core activation.                      PT Education - 05/04/21 1052     Education Details Reviewed benefits of aquatic therapy and consistent stretching.    Person(s) Educated Patient    Methods Explanation;Tactile cues;Demonstration;Verbal cues    Comprehension Verbalized understanding;Returned demonstration;Verbal cues required;Tactile cues required              PT Short Term Goals - 04/14/21 1217       PT SHORT TERM GOAL #1   Title Pt will be ind with initial HEP    Time 3    Period Weeks    Status New    Target Date 05/05/21      PT SHORT TERM GOAL #2   Title Patient will stand with spine in neutral without increased pain    Time 3    Period Weeks    Status New    Target Date 05/05/21      PT SHORT TERM GOAL #3   Title Patient will deomnstrate 5/5 gross LE  strength    Time 3    Period Weeks    Status New    Target Date 05/05/21                      Plan - 05/04/21 1055     Clinical Impression Statement Patient continues to be rotated and flexed forard in stanidng.He han be cued to stand up straighter but seems to revert back quickly. He belives it has something to do with the cyst on his [place]. He was given a wal side glide on the right. He reported he felt like his right hip had to pop. Therapy perfromed a grade 5 mobilization on the hip in a flotaed psotiion but no cavitation was felt. Therapy peorfed manual trigger point release in thefloat position. He tolerated ther-ex well without any significant increase in pain. Therapy will continue to advance as tolerated.    Personal Factors and Comorbidities Time since onset of injury/illness/exacerbation    Examination-Activity Limitations Bed Mobility;Sit;Squat;Stairs;Stand    Examination-Participation Restrictions Community Activity;Shop;Church    Stability/Clinical Decision Making Stable/Uncomplicated    Clinical Decision Making Low    Rehab Potential Good    PT Frequency 2x / week    PT Duration 6 weeks    PT Treatment/Interventions ADLs/Self Care Home Management;Electrical Stimulation;Iontophoresis 4mg /ml Dexamethasone;Ultrasound;Gait training;Stair training;Functional mobility training;Therapeutic activities;Therapeutic exercise;Neuromuscular re-education;Patient/family education;Manual techniques;Dry needling;Passive range of motion;Taping    PT Next Visit Plan manual therapt to correct shift to the right. manual therapy to lumbar paraspinals; needling to paraspinals. review base core strengthening exercises; review wall shift correction, ontinue with manual therapy.    PT Home Exercise Plan Access Code: [email or web address]  URL: [email or web address]  Date: 04/14/2021  Prepared by: Carolyne Littles    Exercises  Supine Piriformis Stretch with Foot on Ground - 2 x daily - 7 x  weekly - 3 sets - 3 reps - 20 hold  Hooklying Single Knee to Chest Stretch - 2 x daily - 7 x weekly - 3 sets - 3 reps - 20 hold  Standing Glute Med Mobilization with Small Ball on Wall - 1 x daily - 7 x weekly - 3 sets - 10 reps    Consulted and Agree with Plan of Care Patient             Patient will benefit from skilled therapeutic  intervention in order to improve the following deficits and impairments:  Abnormal gait, Decreased knowledge of use of DME, Pain, Increased fascial restricitons, Decreased mobility, Increased muscle spasms, Decreased activity tolerance, Decreased range of motion, Decreased strength, Difficulty walking  Visit Diagnosis: Muscle weakness (generalized)  Chronic right-sided low back pain without sciatica  Muscle spasm of back  Abnormal posture     Problem List Patient Active Problem List   Diagnosis Date Noted   Low back pain 02/16/2021   Preventive measure 12/18/2012   Stress and adjustment reaction 12/18/2012    Carney Living 05/05/2021, 12:52 PM  [facility] [address] [place], [place], [address] Phone: [phone]   Fax:  [phone]  Name: Breylan Lefevers MRN: [id number] Date of Birth: 02/01/1956

## 2021-05-11 ENCOUNTER — Other Ambulatory Visit: Payer: Self-pay

## 2021-05-11 ENCOUNTER — Ambulatory Visit (HOSPITAL_BASED_OUTPATIENT_CLINIC_OR_DEPARTMENT_OTHER): Payer: Federal, State, Local not specified - PPO | Admitting: Physical Therapy

## 2021-05-11 ENCOUNTER — Encounter (HOSPITAL_BASED_OUTPATIENT_CLINIC_OR_DEPARTMENT_OTHER): Payer: Self-pay | Admitting: Physical Therapy

## 2021-05-11 DIAGNOSIS — M62830 Muscle spasm of back: Secondary | ICD-10-CM | POA: Diagnosis not present

## 2021-05-11 DIAGNOSIS — R293 Abnormal posture: Secondary | ICD-10-CM | POA: Diagnosis not present

## 2021-05-11 DIAGNOSIS — M545 Low back pain, unspecified: Secondary | ICD-10-CM | POA: Diagnosis not present

## 2021-05-11 DIAGNOSIS — G8929 Other chronic pain: Secondary | ICD-10-CM

## 2021-05-11 DIAGNOSIS — M6281 Muscle weakness (generalized): Secondary | ICD-10-CM | POA: Diagnosis not present

## 2021-05-12 NOTE — Therapy (Addendum)
[place], [place], [address] Phone: [phone]   Fax:  [phone]  Physical Therapy Treatment  Patient Details  Name: Michael [name] MRN: [id number] Date of Birth: May 06, 1956 Referring Provider (PT): Dr Charlann Boxer   Encounter Date: 05/11/2021   PT End of Session - 05/12/21 1222     Visit Number 4    Number of Visits 12    Date for PT Re-Evaluation 05/26/21    Authorization Type BCBS    PT Start Time 0848    PT Stop Time 0929    PT Time Calculation (min) 41 min    Activity Tolerance Patient tolerated treatment well    Behavior During Therapy [facility] for tasks assessed/performed             History reviewed. No pertinent past medical history.  Past Surgical History:  Procedure Laterality Date   LUMBAR [place] SURGERY  2014   right ankle fracture  1978   external pinning    There were no vitals filed for this visit.   Subjective Assessment - 05/11/21 0904     Subjective Patient drug an exercise bike out to the curb yesterday and was sore. He took gabapentin which helped. He feels like overall it comes and goes. He reports relief when he eats and when he voids his bowels. He was advised to talk to his MD about this . He reports he has.    Pertinent History nothing    How long can you sit comfortably? stiffens when he sits    How long can you stand comfortably? prior to the epidural he had pain    Currently in Pain? Yes    Pain Score 5     Pain Location Back    Pain Orientation Right    Pain Descriptors / Indicators Aching    Pain Type Chronic pain    Pain Onset More than a month ago    Pain Frequency Intermittent    Aggravating Factors  bending forward and back    Pain Relieving Factors gabapentin    Multiple Pain Sites No             Pt seen for aquatic therapy today.  Treatment took place in water 4-4.5 ft in depth at the [facility]. Temp of water was 91.  Pt  entered/exited the pool via stairs (step through pattern) independently with bilat rail.   Warm up: heel/toe walking x4 laps across pool chest deep side stepping x4 laps from shallow to deep; reverse walking x2.    Exercises; Slow march x20; x20; squats x20; hip extension x20; hip abduction x20; Sit to stand x20; board trunk flexion x20 lateral board rotation x20 each way seated   With neck noddle and foot float; push pull x20; side to side perturbations from the feet x20; press and push x20, bicycles while floating x30, flutter kicks x20   Hamstring stretch 3x20 sec hold QL stretch from standing on wall 3x20 sec hold   Standing side glide to right mod cuing for technique 15x 5 sec hold   Manual: Manual trigger point release to QL; grade 5 inferior manipulation in floated position    Pt requires buoyancy for support and to offload joints with strengthening exercises. Viscosity of the water is needed for resistance of strengthening; water current perturbations provides challenge to standing balance unsupported, requiring increased core activation.       PT Education - 05/12/21 1221  Education Details Review aquatics HEP and emphasized importance of at home HEP for continueing progress.    Person(s) Educated Patient    Methods Explanation;Demonstration;Tactile cues;Verbal cues    Comprehension Verbalized understanding;Returned demonstration;Verbal cues required;Tactile cues required              PT Short Term Goals - 04/14/21 1217       PT SHORT TERM GOAL #1   Title Pt will be ind with initial HEP    Time 3    Period Weeks    Status New    Target Date 05/05/21      PT SHORT TERM GOAL #2   Title Patient will stand with spine in neutral without increased pain    Time 3    Period Weeks    Status New    Target Date 05/05/21      PT SHORT TERM GOAL #3   Title Patient will deomnstrate 5/5 gross LE strength    Time 3    Period Weeks    Status New    Target Date  05/05/21                      Plan - 05/12/21 1224     Clinical Impression Statement Patient was more upright during visit but still reports pain on right lower back/abdomen. Patient tolerated treatment well with no increase in pain. Emphasis was place on manuel therapy to reduce tightness of right back and exercises to stretch QL. Patient reports that he is enjoying aquatic therapy. Future therapy should focus on progressing aquatic therapy/land HEP while monitering posture.    Personal Factors and Comorbidities Time since onset of injury/illness/exacerbation    Examination-Activity Limitations Bed Mobility;Sit;Squat;Stairs;Stand    Examination-Participation Restrictions Community Activity;Shop;Church    Stability/Clinical Decision Making Stable/Uncomplicated    Clinical Decision Making Low    Rehab Potential Good    PT Frequency 2x / week    PT Duration 6 weeks    PT Treatment/Interventions ADLs/Self Care Home Management;Electrical Stimulation;Iontophoresis 4mg /ml Dexamethasone;Ultrasound;Gait training;Stair training;Functional mobility training;Therapeutic activities;Therapeutic exercise;Neuromuscular re-education;Patient/family education;Manual techniques;Dry needling;Passive range of motion;Taping    PT Next Visit Plan manual therapt to correct shift to the right. manual therapy to lumbar paraspinals; needling to paraspinals. review base core strengthening exercises; review wall shift correction, consider dry needling when land therapy.    PT Home Exercise Plan Access Code: [email or web address]  URL: [email or web address]  Date: 04/14/2021  Prepared by: Carolyne Littles    Exercises  Supine Piriformis Stretch with Foot on Ground - 2 x daily - 7 x weekly - 3 sets - 3 reps - 20 hold  Hooklying Single Knee to Chest Stretch - 2 x daily - 7 x weekly - 3 sets - 3 reps - 20 hold  Standing Glute Med Mobilization with Small Ball on Wall - 1 x daily - 7 x weekly - 3 sets - 10 reps     Consulted and Agree with Plan of Care Patient             Patient will benefit from skilled therapeutic intervention in order to improve the following deficits and impairments:  Abnormal gait, Decreased knowledge of use of DME, Pain, Increased fascial restricitons, Decreased mobility, Increased muscle spasms, Decreased activity tolerance, Decreased range of motion, Decreased strength, Difficulty walking  Visit Diagnosis: Muscle weakness (generalized)  Chronic right-sided low back pain without sciatica  Muscle spasm of back  Abnormal posture     Problem List Patient Active  Problem List   Diagnosis Date Noted   Low back pain 02/16/2021   Preventive measure 12/18/2012   Stress and adjustment reaction 12/18/2012   Carolyne Littles PT DPT  05/12/2021   Billey Co SPT 05/12/2021, 12:44 PM  During this treatment session, the therapist was present, participating in and directing the treatment.   [facility] [address] [place], [place], [address] Phone: [phone]   Fax:  [phone]  Name: Michael [name] MRN: [id number] Date of Birth: 10-05-1956

## 2021-05-17 NOTE — Progress Notes (Signed)
[place] Phone: [phone] Subjective:   Michael [name], LAT, ATC acting as a scribe for Charlann Boxer, DO.  I'm seeing this patient by the request  of:  [name], MD  CC: Low back pain follow-up  AQT:[id number]  03/30/2021 Improvement noted after the epidural.  Patient did seem to do better with this.  The patient does have some mild hamstring tendinopathy bilaterally on MRI but I do not think that this is consistent with his pain.  Still has tightness in the parascapular and paraspinal musculature of the lumbar spine.  Continue the gabapentin.  Will refer patient to physical therapy with aquatic therapy to help with decreasing stress on his joints follow-up with me again in 6 to 8 weeks.  Total time reviewing patient's chart and talking to patient 33 minutes  Update 05/18/2021 Michael [name] is a 65 y.o. male coming in with complaint of back pain. Patient has been doing aquatic physical therapy. Patient states some improvement. Pt notes this past week he's been drinking more water and had been adding fresh squeezed lemon juice. Pt has some questions about prior testing that he had done regarding inflammation. Pt notes a hx of a "cyst" in 2021.  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  Patient has been encouraged to follow-up with urology if he wants further evaluation of this. Patient states that otherwise seems to be doing a little bit better.  Was able to do more yard work around his mother's house.  Less pain than he has had in the past.     No past medical history on file. Past Surgical History:  Procedure Laterality Date   LUMBAR [place] SURGERY  2014   right ankle fracture  1978   external pinning   Social History   Socioeconomic History   Marital status: Single    Spouse name: Not on file   Number of children: Not on file   Years of education: Not  on file   Highest education level: Not on file  Occupational History   Not on file  Tobacco Use   Smoking status: Never   Smokeless tobacco: Never  Substance and Sexual Activity   Alcohol use: Yes    Comment: rare   Drug use: No   Sexual activity: Yes  Other Topics Concern   Not on file  Social History Narrative   Not on file   Social Determinants of Health   Financial Resource Strain: Not on file  Food Insecurity: Not on file  Transportation Needs: Not on file  Physical Activity: Not on file  Stress: Not on file  Social Connections: Not on file   No Known Allergies Family History  Problem Relation Age of Onset   High blood pressure Mother     Current Outpatient Medications (Endocrine & Metabolic):    predniSONE (DELTASONE) 20 MG tablet, Take 1 tablet (20 mg total) by mouth daily with breakfast. (Patient not taking: No sig reported)      Current Outpatient Medications (Other):    Blood Pressure Monitoring (ADULT BLOOD PRESSURE CUFF LG) KIT, 1 Device by Does not apply route daily.   gabapentin (NEURONTIN) 100 MG capsule, Take 2 capsules (200 mg total) by mouth at bedtime.   Reviewed prior external information including notes and imaging from  primary care provider As well as notes that were available from care everywhere and other healthcare  systems.  Past medical history, social, surgical and family history all reviewed in electronic medical record.  No pertanent information unless stated regarding to the chief complaint.   Review of Systems:  No headache, visual changes, nausea, vomiting, diarrhea, constipation, dizziness, a, skin rash, fevers, chills, night sweats, weight loss, swollen lymph nodes,  joint swelling, chest pain, shortness of breath, mood changes. POSITIVE muscle aches, body aches, abdominal pain  Objective  Blood pressure 124/84, pulse (!) 49, height 6' 3" (1.905 m), weight 225 lb 3.2 oz (102.2 kg), SpO2 96 %.   General: No apparent distress  alert and oriented x3 mood and affect normal, dressed appropriately.  HEENT: Pupils equal, extraocular movements intact  Respiratory: Patient's speak in full sentences and does not appear short of breath  Cardiovascular: No lower extremity edema, non tender, no erythema  Gait nonantalgic noted. MSK: Low back exam still has loss of lordosis.  Significant tight hamstrings noted the patient does have improvement in lection developing a little bit and extension of the back.  Patient does have tightness with FABER test but significantly still tight hip flexors.  Patient does have some gastroenterology tenderness in the abdominal region on the right side.  No masses appreciated.  More voluntary guarding and negative involuntary guarding. Neurovascularly intact distally.   Impression and Recommendations:    The above documentation has been reviewed and is accurate and complete [name], DO

## 2021-05-18 ENCOUNTER — Encounter (HOSPITAL_BASED_OUTPATIENT_CLINIC_OR_DEPARTMENT_OTHER): Payer: Self-pay | Admitting: Physical Therapy

## 2021-05-18 ENCOUNTER — Encounter: Payer: Self-pay | Admitting: Gastroenterology

## 2021-05-18 ENCOUNTER — Ambulatory Visit (INDEPENDENT_AMBULATORY_CARE_PROVIDER_SITE_OTHER): Payer: Federal, State, Local not specified - PPO | Admitting: Family Medicine

## 2021-05-18 ENCOUNTER — Other Ambulatory Visit: Payer: Self-pay

## 2021-05-18 ENCOUNTER — Ambulatory Visit (HOSPITAL_BASED_OUTPATIENT_CLINIC_OR_DEPARTMENT_OTHER): Payer: Federal, State, Local not specified - PPO | Admitting: Physical Therapy

## 2021-05-18 ENCOUNTER — Encounter: Payer: Self-pay | Admitting: Family Medicine

## 2021-05-18 DIAGNOSIS — M545 Low back pain, unspecified: Secondary | ICD-10-CM | POA: Diagnosis not present

## 2021-05-18 DIAGNOSIS — M5442 Lumbago with sciatica, left side: Secondary | ICD-10-CM | POA: Diagnosis not present

## 2021-05-18 DIAGNOSIS — M62830 Muscle spasm of back: Secondary | ICD-10-CM

## 2021-05-18 DIAGNOSIS — M6281 Muscle weakness (generalized): Secondary | ICD-10-CM

## 2021-05-18 DIAGNOSIS — M5441 Lumbago with sciatica, right side: Secondary | ICD-10-CM | POA: Diagnosis not present

## 2021-05-18 DIAGNOSIS — G8929 Other chronic pain: Secondary | ICD-10-CM

## 2021-05-18 DIAGNOSIS — R293 Abnormal posture: Secondary | ICD-10-CM

## 2021-05-18 NOTE — Patient Instructions (Addendum)
Good to see you today!  I think you are making good progress.  Continue PT 1-2x/week for 3-4 more weeks  Increase activity as tolerated  May be a good idea to follow up with GI  See me again in  2-3 months

## 2021-05-18 NOTE — Therapy (Cosign Needed)
[place], [place], [address] Phone: [phone]   Fax:  [phone]  Physical Therapy Treatment  Patient Details  Name: Michael [name] MRN: [id number] Date of Birth: [birth year] Referring Provider (PT): Dr Charlann Boxer   Encounter Date: 05/18/2021   PT End of Session - 05/18/21 0819     Visit Number 5    Number of Visits 12    Date for PT Re-Evaluation 05/26/21    Authorization Type BCBS    PT Start Time 0807    PT Stop Time 0850    PT Time Calculation (min) 43 min    Activity Tolerance Patient tolerated treatment well    Behavior During Therapy [facility] for tasks assessed/performed             History reviewed. No pertinent past medical history.  Past Surgical History:  Procedure Laterality Date   LUMBAR [place] SURGERY  2014   right ankle fracture  1978   external pinning    There were no vitals filed for this visit.   Subjective Assessment - 05/18/21 0812     Subjective Patient states he was clean and doing yard work. At first it was okay but then his back started to get aggrevated. Patient states that drinking and eating provide some relief.  He states he has a follow up appointment with his MD.    Pertinent History nothing    How long can you sit comfortably? stiffens when he sits    How long can you stand comfortably? prior to the epidural he had pain    Currently in Pain? Yes    Pain Score 5     Pain Location Back    Pain Descriptors / Indicators Aching    Pain Type Chronic pain    Pain Onset More than a month ago    Pain Frequency Intermittent    Aggravating Factors  bending forward and back    Pain Relieving Factors gabapentin    Multiple Pain Sites No                               [facility] Adult PT Treatment/Exercise - 05/18/21 0001       Lumbar Exercises: Stretches   Standing Side Bend 5 reps;10 seconds   1 set of   Other Lumbar Stretch Exercise standing  stretch with right shoulder rising up wall, 2x5      Manual Therapy   Manual Therapy Soft tissue mobilization;Joint mobilization    Manual therapy comments skilled palpation of trigger points    Joint Mobilization grade III and IV    Soft tissue mobilization IASTYM to QL lower lumbar paraspianls                    PT Education - 05/18/21 8299     Education Details Reviewed land HEP and what to expect after dry needling.    Person(s) Educated Patient    Methods Explanation;Demonstration;Tactile cues;Verbal cues    Comprehension Returned demonstration;Verbalized understanding;Verbal cues required;Tactile cues required              PT Short Term Goals - 04/14/21 1217       PT SHORT TERM GOAL #1   Title Pt will be ind with initial HEP    Time 3    Period Weeks    Status New    Target Date 05/05/21  PT SHORT TERM GOAL #2   Title Patient will stand with spine in neutral without increased pain    Time 3    Period Weeks    Status New    Target Date 05/05/21      PT SHORT TERM GOAL #3   Title Patient will deomnstrate 5/5 gross LE strength    Time 3    Period Weeks    Status New    Target Date 05/05/21                      Plan - 05/18/21 0820     Clinical Impression Statement Patient tolerated therapy with no increase in pain on lower back/abdomen.  Therapy focused on manuel therapy to reduce spasm of right lower paraspinals and QL. Patient stated treatment provided some relief but still has the "need to feel a pop." Patient states he still has tightness that is deep, that is relieve with fluid intake and bowel movements. Patient was instructed to perform postural exercises at home and continue to perform HEP at home. Patient should continue therapy as tolerated.    Personal Factors and Comorbidities Time since onset of injury/illness/exacerbation    Examination-Activity Limitations Bed Mobility;Sit;Squat;Stairs;Stand    Examination-Participation  Restrictions Community Activity;Shop;Church    Stability/Clinical Decision Making Stable/Uncomplicated    Clinical Decision Making Low    Rehab Potential Good    PT Frequency 2x / week    PT Duration 6 weeks    PT Treatment/Interventions ADLs/Self Care Home Management;Electrical Stimulation;Iontophoresis 4mg /ml Dexamethasone;Ultrasound;Gait training;Stair training;Functional mobility training;Therapeutic activities;Therapeutic exercise;Neuromuscular re-education;Patient/family education;Manual techniques;Dry needling;Passive range of motion;Taping    PT Next Visit Plan manual therapt to correct shift to the right. manual therapy to lumbar paraspinals; needling to paraspinals. review base core strengthening exercises; review wall shift correction, consider dry needling when land therapy.    PT Home Exercise Plan Access Code: [email or web address]  URL: [email or web address]  Date: 04/14/2021  Prepared by: Carolyne Littles    Exercises  Supine Piriformis Stretch with Foot on Ground - 2 x daily - 7 x weekly - 3 sets - 3 reps - 20 hold  Hooklying Single Knee to Chest Stretch - 2 x daily - 7 x weekly - 3 sets - 3 reps - 20 hold  Standing Glute Med Mobilization with Small Ball on Wall - 1 x daily - 7 x weekly - 3 sets - 10 reps    Consulted and Agree with Plan of Care Patient             Patient will benefit from skilled therapeutic intervention in order to improve the following deficits and impairments:  Abnormal gait, Decreased knowledge of use of DME, Pain, Increased fascial restricitons, Decreased mobility, Increased muscle spasms, Decreased activity tolerance, Decreased range of motion, Decreased strength, Difficulty walking  Visit Diagnosis: Muscle weakness (generalized)  Chronic right-sided low back pain without sciatica  Muscle spasm of back  Abnormal posture     Problem List Patient Active Problem List   Diagnosis Date Noted   Low back pain 02/16/2021   Preventive measure  12/18/2012   Stress and adjustment reaction 12/18/2012   Carolyne Littles PT DPT  05/18/2021  [place] SPT 05/18/2021, 12:31 PM  During this treatment session, the therapist was present, participating in and directing the treatment.   [place] [facility] [address] [place], [place], [address] Phone: [phone]   Fax:  [phone]  Name: Michael [name] MRN: [id number] Date  of Birth: 13-Jun-1956

## 2021-05-18 NOTE — Assessment & Plan Note (Addendum)
Greater thanPatient has made improvement.  Patient does have improvement in range of motion.  Still has significant loss of lordosis and does have difficulty with flexion of the back.  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  We did discuss with patient that we should consider the possibility of nerve conduction test as well.  Patient though at this point because he is making improvement would like to hold on that but we will consider that at follow-up.  Can also consider the possibility of an epidural.  Also discussed with patient about the possibility with patient having some gastroenterology type pathology that may be considering referral to a GI.  Patient declined at this time.  Total time reviewing patient's chart including physical therapy notes, previous imaging in my notes 35 minutes.

## 2021-05-25 ENCOUNTER — Encounter (HOSPITAL_BASED_OUTPATIENT_CLINIC_OR_DEPARTMENT_OTHER): Payer: Self-pay | Admitting: Physical Therapy

## 2021-05-25 ENCOUNTER — Other Ambulatory Visit: Payer: Self-pay

## 2021-05-25 ENCOUNTER — Ambulatory Visit (HOSPITAL_BASED_OUTPATIENT_CLINIC_OR_DEPARTMENT_OTHER): Payer: Federal, State, Local not specified - PPO | Admitting: Physical Therapy

## 2021-05-25 DIAGNOSIS — R293 Abnormal posture: Secondary | ICD-10-CM | POA: Diagnosis not present

## 2021-05-25 DIAGNOSIS — G8929 Other chronic pain: Secondary | ICD-10-CM | POA: Diagnosis not present

## 2021-05-25 DIAGNOSIS — M62830 Muscle spasm of back: Secondary | ICD-10-CM

## 2021-05-25 DIAGNOSIS — M6281 Muscle weakness (generalized): Secondary | ICD-10-CM | POA: Diagnosis not present

## 2021-05-25 DIAGNOSIS — M545 Low back pain, unspecified: Secondary | ICD-10-CM

## 2021-05-26 ENCOUNTER — Encounter (HOSPITAL_BASED_OUTPATIENT_CLINIC_OR_DEPARTMENT_OTHER): Payer: Self-pay | Admitting: Physical Therapy

## 2021-05-26 NOTE — Therapy (Signed)
[place], [place], [address] Phone: [phone]   Fax:  [phone]  Physical Therapy Treatment  Patient Details  Name: Michael [name] MRN: [id number] Date of Birth: Jun 17, 1956 Referring Provider (PT): Dr Charlann Boxer   Encounter Date: 05/25/2021   PT End of Session - 05/25/21 1242     Visit Number 6    Number of Visits 12    Date for PT Re-Evaluation 05/26/21    Authorization Type BCBS    PT Start Time 1230    PT Stop Time 1314    PT Time Calculation (min) 44 min    Activity Tolerance Patient tolerated treatment well    Behavior During Therapy [facility] for tasks assessed/performed             History reviewed. No pertinent past medical history.  Past Surgical History:  Procedure Laterality Date   LUMBAR [place] SURGERY  2014   right ankle fracture  1978   external pinning    There were no vitals filed for this visit.   Subjective Assessment - 05/25/21 1237     Subjective Patient states he wants to continue physical therapy to decrease right back tightness and spasm. Patient states that he is having some trouble putting his pants on. Patient states that he is making an appoinment with a gastroenterologist to discuss his drinking/eating/bowel movements that relief/aggrevate his symptoms    Pertinent History nothing    How long can you sit comfortably? stiffens when he sits    How long can you stand comfortably? prior to the epidural he had pain    Currently in Pain? Yes    Pain Score 4     Pain Location Back    Pain Orientation Right    Pain Descriptors / Indicators Aching    Pain Type Chronic pain    Pain Onset More than a month ago    Pain Frequency Intermittent    Aggravating Factors  bending forward and back    Pain Relieving Factors gabapentin    Multiple Pain Sites No                               [facility] Adult PT Treatment/Exercise - 05/26/21 0001        Lumbar Exercises: Stretches   Lower Trunk Rotation Limitations x20    Standing Side Bend 5 reps;10 seconds   1 set of   Piriformis Stretch Limitations 3x20 sec hold    Other Lumbar Stretch Exercise thomas stretch 3x20 sec hold with mod cuing; standing anterior jip stretch 3x20 sec with mod cuing      Lumbar Exercises: Standing   Other Standing Lumbar Exercises standing slow march with empahsis onright single leg stance and right hip flexion 3x10      Manual Therapy   Manual Therapy Soft tissue mobilization;Joint mobilization    Manual therapy comments skilled palpation of trigger points    Joint Mobilization grade III and IV    Soft tissue mobilization IASTYM to QL lower lumbar paraspianls              Trigger Point Dry Needling - 05/26/21 0001     Consent Given? Yes    Education Handout Provided Yes    Muscles Treated Back/Hip Lumbar multifidi    Other Dry Needling 4 spots int he right lumbar multifidi from L2 to L5    Lumbar multifidi Response Twitch  response elicited;Palpable increased muscle length                  PT Education - 05/25/21 1242     Education Details Reviewed land HEP and directions in which PT can help.    Person(s) Educated Patient    Methods Explanation;Demonstration;Tactile cues;Verbal cues    Comprehension Verbalized understanding;Returned demonstration;Verbal cues required;Tactile cues required              PT Short Term Goals - 05/26/21 0701       PT SHORT TERM GOAL #1   Title Pt will be ind with initial HEP    Baseline has basic HEP    Time 3    Period Weeks    Status Achieved      PT SHORT TERM GOAL #2   Title Patient will stand with spine in neutral without increased pain    Baseline has to force himself into a neutral position    Time 3    Period Weeks    Status On-going    Target Date 05/05/21      PT SHORT TERM GOAL #3   Title Patient will deomnstrate 5/5 gross LE strength    Time 3    Period Weeks    Status  On-going    Target Date 05/05/21                      Plan - 05/25/21 1244     Clinical Impression Statement Patient is now reporting when he wakes up in the morning he has to urinate 4x withing the first hour. He is seeing a urologist. He also reports he is having a hard time putting his pants on int he morning. he feles like he is having difficulty lifting his right leg so therapy worked on this. He was given a standing an supine stretch for the front of the hip and a standing hip flexion exercises. He tolerated well. Therapy needled his paraspinals and perfromed side yling QL stretching. We also perfromed a side lying manipulation without cavitation. We will continue to work on getting him to stand straighter and to work on the musculo-skeletal aspect of his lower back pain.    Personal Factors and Comorbidities Time since onset of injury/illness/exacerbation    Examination-Activity Limitations Bed Mobility;Sit;Squat;Stairs;Stand    Examination-Participation Restrictions Community Activity;Shop;Church    Stability/Clinical Decision Making Stable/Uncomplicated    Clinical Decision Making Low    Rehab Potential Good    PT Frequency 2x / week    PT Duration 6 weeks    PT Treatment/Interventions ADLs/Self Care Home Management;Electrical Stimulation;Iontophoresis '4mg'$ /ml Dexamethasone;Ultrasound;Gait training;Stair training;Functional mobility training;Therapeutic activities;Therapeutic exercise;Neuromuscular re-education;Patient/family education;Manual techniques;Dry needling;Passive range of motion;Taping    PT Next Visit Plan manual therapt to correct shift to the right. manual therapy to lumbar paraspinals; needling to paraspinals. review base core strengthening exercises; review wall shift correction, consider dry needling when land therapy.    PT Home Exercise Plan Access Code: [email or web address]  URL: [email or web address]  Date: 04/14/2021  Prepared by: Carolyne Littles     Exercises  Supine Piriformis Stretch with Foot on Ground - 2 x daily - 7 x weekly - 3 sets - 3 reps - 20 hold  Hooklying Single Knee to Chest Stretch - 2 x daily - 7 x weekly - 3 sets - 3 reps - 20 hold  Standing Glute Med Mobilization with Small Ball on Wall - 1 x daily - 7 x weekly -  3 sets - 10 reps    Consulted and Agree with Plan of Care Patient             Patient will benefit from skilled therapeutic intervention in order to improve the following deficits and impairments:  Abnormal gait, Decreased knowledge of use of DME, Pain, Increased fascial restricitons, Decreased mobility, Increased muscle spasms, Decreased activity tolerance, Decreased range of motion, Decreased strength, Difficulty walking  Visit Diagnosis: Muscle weakness (generalized)  Chronic right-sided low back pain without sciatica  Muscle spasm of back  Abnormal posture     Problem List Patient Active Problem List   Diagnosis Date Noted   Low back pain 02/16/2021   Preventive measure 12/18/2012   Stress and adjustment reaction 12/18/2012    Carney Living PT DPT  05/26/2021, 8:15 AM  [place] SPT  05/26/2021  During this treatment session, the therapist was present, participating in and directing the treatment.   [facility] [address] [place], [place], [address] Phone: [phone]   Fax:  [phone]  Name: Michael [name] MRN: [id number] Date of Birth: 1956-08-24

## 2021-05-31 ENCOUNTER — Ambulatory Visit (HOSPITAL_BASED_OUTPATIENT_CLINIC_OR_DEPARTMENT_OTHER): Payer: Federal, State, Local not specified - PPO | Attending: Family Medicine | Admitting: Physical Therapy

## 2021-05-31 ENCOUNTER — Encounter (HOSPITAL_BASED_OUTPATIENT_CLINIC_OR_DEPARTMENT_OTHER): Payer: Self-pay | Admitting: Physical Therapy

## 2021-05-31 ENCOUNTER — Other Ambulatory Visit: Payer: Self-pay

## 2021-05-31 DIAGNOSIS — M6281 Muscle weakness (generalized): Secondary | ICD-10-CM | POA: Diagnosis not present

## 2021-05-31 DIAGNOSIS — G8929 Other chronic pain: Secondary | ICD-10-CM | POA: Diagnosis not present

## 2021-05-31 DIAGNOSIS — M62830 Muscle spasm of back: Secondary | ICD-10-CM | POA: Diagnosis not present

## 2021-05-31 DIAGNOSIS — R293 Abnormal posture: Secondary | ICD-10-CM | POA: Insufficient documentation

## 2021-05-31 DIAGNOSIS — M545 Low back pain, unspecified: Secondary | ICD-10-CM | POA: Diagnosis not present

## 2021-05-31 NOTE — Therapy (Signed)
[place], [place], [address] Phone: [phone]   Fax:  [phone]  Physical Therapy Treatment  Patient Details  Name: Michael [name] MRN: [id number] Date of Birth: [birth year] Referring Provider (PT): Dr Charlann Boxer   Encounter Date: 05/31/2021   PT End of Session - 05/31/21 1643     Visit Number 6    Number of Visits 12    Date for PT Re-Evaluation 07/12/21    Authorization Type BCBS    PT Start Time 0930    PT Stop Time 1012    PT Time Calculation (min) 42 min    Activity Tolerance Patient tolerated treatment well    Behavior During Therapy [facility] for tasks assessed/performed             History reviewed. No pertinent past medical history.  Past Surgical History:  Procedure Laterality Date   LUMBAR [place] SURGERY  2014   right ankle fracture  1978   external pinning    There were no vitals filed for this visit.   Subjective Assessment - 05/31/21 1640     Subjective Patient reports the intense pain has decreased. His pain is more positional at this time. he feels like something is restricing them. He continues to have pain depending on what he eats. He feels like he is standing straighter overall. He has been more aware of his posture.    Pertinent History nothing    How long can you sit comfortably? stiffens when he sits    How long can you stand comfortably? prior to the epidural he had pain    Currently in Pain? Yes    Pain Score 3     Pain Location Back    Pain Orientation Right    Pain Descriptors / Indicators Aching    Pain Type Chronic pain    Pain Onset More than a month ago    Pain Frequency Intermittent    Aggravating Factors  standing forward and back    Pain Relieving Factors gabapentin; stretching    Multiple Pain Sites No                               [facility] Adult PT Treatment/Exercise - 05/31/21 0001       Lumbar Exercises: Stretches   Lower  Trunk Rotation Limitations x20    Standing Side Bend 5 reps;10 seconds   1 set of   Other Lumbar Stretch Exercise right side reach 3x10 sec hold      Manual Therapy   Manual Therapy Soft tissue mobilization;Joint mobilization    Manual therapy comments skilled palpation of trigger points    Joint Mobilization grade III and IV    Soft tissue mobilization IASTYM to QL lower lumbar paraspianls                    PT Education - 05/31/21 1643     Education Details HEP and symptom mangement    Person(s) Educated Patient    Methods Explanation;Demonstration;Tactile cues;Verbal cues    Comprehension Verbalized understanding;Returned demonstration;Verbal cues required;Tactile cues required              PT Short Term Goals - 05/26/21 0701       PT SHORT TERM GOAL #1   Title Pt will be ind with initial HEP    Baseline has basic HEP    Time 3  Period Weeks    Status Achieved      PT SHORT TERM GOAL #2   Title Patient will stand with spine in neutral without increased pain    Baseline has to force himself into a neutral position    Time 3    Period Weeks    Status On-going    Target Date 05/05/21      PT SHORT TERM GOAL #3   Title Patient will deomnstrate 5/5 gross LE strength    Time 3    Period Weeks    Status On-going    Target Date 05/05/21                      Plan - 05/31/21 1645     Clinical Impression Statement Patient was able to stand straighter without cuing today. Overall his muscle tightness is improving. He still feel positional pain and pain with activity. Therapy focused on manual therapy today and stretching. he is doing his exeercises daily on his own. We will continue with water therapy next visit. He will be re-certified today. He was intially was certified for 4 more weeks but was unable to schedule for the first two wekes of that. We will continue for 2-4 visits depending on the patients progress.    Personal Factors and  Comorbidities Time since onset of injury/illness/exacerbation    Examination-Activity Limitations Bed Mobility;Sit;Squat;Stairs;Stand    Examination-Participation Restrictions Community Activity;Shop;Church    Stability/Clinical Decision Making Stable/Uncomplicated    Clinical Decision Making Low    Rehab Potential Good    PT Frequency 2x / week    PT Duration 6 weeks    PT Treatment/Interventions ADLs/Self Care Home Management;Electrical Stimulation;Iontophoresis '4mg'$ /ml Dexamethasone;Ultrasound;Gait training;Stair training;Functional mobility training;Therapeutic activities;Therapeutic exercise;Neuromuscular re-education;Patient/family education;Manual techniques;Dry needling;Passive range of motion;Taping    PT Next Visit Plan manual therapt to correct shift to the right. manual therapy to lumbar paraspinals; needling to paraspinals. review base core strengthening exercises; review wall shift correction, consider dry needling when land therapy.    PT Home Exercise Plan Access Code: [email or web address]  URL: [email or web address]  Date: 04/14/2021  Prepared by: Carolyne Littles    Exercises  Supine Piriformis Stretch with Foot on Ground - 2 x daily - 7 x weekly - 3 sets - 3 reps - 20 hold  Hooklying Single Knee to Chest Stretch - 2 x daily - 7 x weekly - 3 sets - 3 reps - 20 hold  Standing Glute Med Mobilization with Small Ball on Wall - 1 x daily - 7 x weekly - 3 sets - 10 reps    Consulted and Agree with Plan of Care Patient             Patient will benefit from skilled therapeutic intervention in order to improve the following deficits and impairments:  Abnormal gait, Decreased knowledge of use of DME, Pain, Increased fascial restricitons, Decreased mobility, Increased muscle spasms, Decreased activity tolerance, Decreased range of motion, Decreased strength, Difficulty walking  Visit Diagnosis: Muscle weakness (generalized)  Chronic right-sided low back pain without sciatica  Muscle  spasm of back  Abnormal posture     Problem List Patient Active Problem List   Diagnosis Date Noted   Low back pain 02/16/2021   Preventive measure 12/18/2012   Stress and adjustment reaction 12/18/2012    Carney Living PT DPT  05/31/2021, 8:28 PM  Davis Gourd  05/31/2021  During this treatment session, the therapist was present, participating in and directing  the treatment.   [facility] [address] [place], [place], [address] Phone: [phone]   Fax:  [phone]  Name: Michael [name] MRN: [id number] Date of Birth: 01-10-1956

## 2021-06-06 ENCOUNTER — Other Ambulatory Visit: Payer: Self-pay

## 2021-06-06 ENCOUNTER — Ambulatory Visit (HOSPITAL_BASED_OUTPATIENT_CLINIC_OR_DEPARTMENT_OTHER): Payer: Federal, State, Local not specified - PPO | Admitting: Physical Therapy

## 2021-06-06 DIAGNOSIS — G8929 Other chronic pain: Secondary | ICD-10-CM | POA: Diagnosis not present

## 2021-06-06 DIAGNOSIS — R293 Abnormal posture: Secondary | ICD-10-CM

## 2021-06-06 DIAGNOSIS — M545 Low back pain, unspecified: Secondary | ICD-10-CM

## 2021-06-06 DIAGNOSIS — M62830 Muscle spasm of back: Secondary | ICD-10-CM | POA: Diagnosis not present

## 2021-06-06 DIAGNOSIS — M6281 Muscle weakness (generalized): Secondary | ICD-10-CM

## 2021-06-06 NOTE — Therapy (Signed)
[place], [place], [address] Phone: [phone]   Fax:  [phone]  Physical Therapy Treatment  Patient Details  Name: Michael [name] MRN: [id number] Date of Birth: 04/20/1956 Referring Provider (PT): Dr Charlann Boxer   Encounter Date: 06/06/2021   PT End of Session - 06/06/21 1527     Visit Number 8    Number of Visits 12    Date for PT Re-Evaluation 07/12/21    Authorization Type BCBS    PT Start Time 0930    PT Stop Time 1010    PT Time Calculation (min) 40 min    Activity Tolerance Patient tolerated treatment well    Behavior During Therapy [facility] for tasks assessed/performed             No past medical history on file.  Past Surgical History:  Procedure Laterality Date   LUMBAR [place] SURGERY  2014   right ankle fracture  1978   external pinning    There were no vitals filed for this visit.   Subjective Assessment - 06/06/21 1507     Subjective Patient feels he is standing straighter. He has been working on his stretches and exercises at home. he comes in standing straighter but still with a lean to the right. He is still feeling like his back pain is tied to what he is eating.    Pertinent History nothing    How long can you sit comfortably? stiffens when he sits    How long can you stand comfortably? prior to the epidural he had pain    Currently in Pain? No/denies    Multiple Pain Sites No                     Pt seen for aquatic therapy today.  Treatment took place in water 4-4.5 ft in depth at the [facility]. Temp of water was 91.  Pt entered/exited the pool via stairs (step through pattern) independently with bilat rail.     Warm up: heel/toe walking x4 laps across pool chest deep side stepping x4 laps from shallow to deep; reverse walking x2.   Exercises; Slow march x20; x20; squats x20; hip extension x20; hip abduction x20; Sit to stand x20; all  with 5.5 pound weight  board trunk flexion x20 lateral board rotation x20 each way seated   With neck noddle and foot float; bicycle with 5.5 lb weight    QL stretch from standing on wall 3x20 sec hold   Standing side glide to right mod cuing for technique 15x 5 sec hold   Manual: Manual trigger point release to QL;push and pull press in float position    Pt requires buoyancy for support and to offload joints with strengthening exercises. Viscosity of the water is needed for resistance of strengthening; water current perturbations provides challenge to standing balance unsupported, requiring increased core activation.                  PT Education - 06/06/21 1526     Education Details reviewed stretching and exercises    Person(s) Educated Patient    Methods Explanation;Demonstration;Tactile cues;Verbal cues    Comprehension Verbalized understanding;Returned demonstration;Verbal cues required;Tactile cues required              PT Short Term Goals - 05/26/21 0701       PT SHORT TERM GOAL #1   Title Pt will be ind with initial  HEP    Baseline has basic HEP    Time 3    Period Weeks    Status Achieved      PT SHORT TERM GOAL #2   Title Patient will stand with spine in neutral without increased pain    Baseline has to force himself into a neutral position    Time 3    Period Weeks    Status On-going    Target Date 05/05/21      PT SHORT TERM GOAL #3   Title Patient will deomnstrate 5/5 gross LE strength    Time 3    Period Weeks    Status On-going    Target Date 05/05/21                      Plan - 06/06/21 1527     Clinical Impression Statement Patient continues to tolerate treatment well. He reported a good stretch with manual therapy to the lumbar spine. Therapy perfromed a push on left pull on right stretch for the right side. He reported feeling a pop. He perfroemd standing pool exericses without significant pain.    Personal  Factors and Comorbidities Time since onset of injury/illness/exacerbation    Examination-Activity Limitations Bed Mobility;Sit;Squat;Stairs;Stand    Examination-Participation Restrictions Community Activity;Shop;Church    Stability/Clinical Decision Making Stable/Uncomplicated    Clinical Decision Making Low    Rehab Potential Good    PT Frequency 2x / week    PT Duration 6 weeks    PT Treatment/Interventions ADLs/Self Care Home Management;Electrical Stimulation;Iontophoresis '4mg'$ /ml Dexamethasone;Ultrasound;Gait training;Stair training;Functional mobility training;Therapeutic activities;Therapeutic exercise;Neuromuscular re-education;Patient/family education;Manual techniques;Dry needling;Passive range of motion;Taping    PT Next Visit Plan manual therapt to correct shift to the right. manual therapy to lumbar paraspinals; needling to paraspinals. review base core strengthening exercises; review wall shift correction, consider dry needling when land therapy.    PT Home Exercise Plan Access Code: [email or web address]  URL: [email or web address]  Date: 04/14/2021  Prepared by: Carolyne Littles    Exercises  Supine Piriformis Stretch with Foot on Ground - 2 x daily - 7 x weekly - 3 sets - 3 reps - 20 hold  Hooklying Single Knee to Chest Stretch - 2 x daily - 7 x weekly - 3 sets - 3 reps - 20 hold  Standing Glute Med Mobilization with Small Ball on Wall - 1 x daily - 7 x weekly - 3 sets - 10 reps    Consulted and Agree with Plan of Care Patient             Patient will benefit from skilled therapeutic intervention in order to improve the following deficits and impairments:  Abnormal gait, Decreased knowledge of use of DME, Pain, Increased fascial restricitons, Decreased mobility, Increased muscle spasms, Decreased activity tolerance, Decreased range of motion, Decreased strength, Difficulty walking  Visit Diagnosis: Muscle weakness (generalized)  Chronic right-sided low back pain without  sciatica  Muscle spasm of back  Abnormal posture     Problem List Patient Active Problem List   Diagnosis Date Noted   Low back pain 02/16/2021   Preventive measure 12/18/2012   Stress and adjustment reaction 12/18/2012    Carney Living PT DPT  06/06/2021, 3:31 PM  Davis Gourd SPT 8//2022  During this treatment session, the therapist was present, participating in and directing the treatment.    [facility] [address] [place], [place], [address] Phone: [phone]   Fax:  [phone]  Name: Michael [name] MRN: [id number] Date of Birth: 08/30/1956

## 2021-06-20 ENCOUNTER — Encounter (HOSPITAL_BASED_OUTPATIENT_CLINIC_OR_DEPARTMENT_OTHER): Payer: Self-pay | Admitting: Physical Therapy

## 2021-06-20 ENCOUNTER — Ambulatory Visit (HOSPITAL_BASED_OUTPATIENT_CLINIC_OR_DEPARTMENT_OTHER): Payer: Federal, State, Local not specified - PPO | Admitting: Physical Therapy

## 2021-06-20 ENCOUNTER — Other Ambulatory Visit: Payer: Self-pay

## 2021-06-20 DIAGNOSIS — M6281 Muscle weakness (generalized): Secondary | ICD-10-CM | POA: Diagnosis not present

## 2021-06-20 DIAGNOSIS — R293 Abnormal posture: Secondary | ICD-10-CM

## 2021-06-20 DIAGNOSIS — M62830 Muscle spasm of back: Secondary | ICD-10-CM

## 2021-06-20 DIAGNOSIS — G8929 Other chronic pain: Secondary | ICD-10-CM

## 2021-06-20 DIAGNOSIS — M545 Low back pain, unspecified: Secondary | ICD-10-CM

## 2021-06-20 NOTE — Therapy (Signed)
[place] [place] [place], [place], [address] Phone: [phone]   Fax:  [phone]  Physical Therapy Treatment  Patient Details  Name: Michael [name] MRN: [id number] Date of Birth: 01/26/1956 Referring Provider (PT): Dr Charlann Boxer   Encounter Date: 06/20/2021   PT End of Session - 06/20/21 1556     Visit Number 9    Number of Visits 12    Date for PT Re-Evaluation 07/12/21    Authorization Type BCBS    PT Start Time 0930    PT Stop Time 1012    PT Time Calculation (min) 42 min    Activity Tolerance Patient tolerated treatment well    Behavior During Therapy [facility] for tasks assessed/performed             History reviewed. No pertinent past medical history.  Past Surgical History:  Procedure Laterality Date   LUMBAR [place] SURGERY  2014   right ankle fracture  1978   external pinning    There were no vitals filed for this visit.   Subjective Assessment - 06/20/21 0933     Subjective Patient continues to report eating and urination changes his pain. He is having some discomfort this morning    Pertinent History nothing    How long can you sit comfortably? stiffens when he sits    How long can you stand comfortably? prior to the epidural he had pain    Currently in Pain? Yes    Pain Score 4     Pain Location Back    Pain Orientation Right    Pain Descriptors / Indicators Aching    Pain Type Chronic pain    Pain Onset More than a month ago    Pain Frequency Intermittent    Aggravating Factors  walking , standing    Pain Relieving Factors urination and eating a large meal    Multiple Pain Sites No                       Pt seen for aquatic therapy today.  Treatment took place in water 4-4.5 ft in depth at the [facility]. Temp of water was 91.  Pt entered/exited the pool via stairs (step through pattern) independently with bilat rail.     Warm up: heel/toe walking x4  laps across pool chest deep side stepping x4 laps from shallow to deep; reverse walking x2. Lunge walking forward 2 laps     Exercises; Slow march x20; x20; squats x20; hip extension x20; hip abduction x20; Sit to stand x20; all with 5.5 pound weight   board trunk flexion x20 lateral board rotation x20 each way seated        QL stretch from standing on wall 3x20 sec hold to the right    Standing side glide to right mod cuing for technique 15x 5 sec hold   Manual: Manual trigger point release to QL;push and pull press in float position    Pt requires buoyancy for support and to offload joints with strengthening exercises. Viscosity of the water is needed for resistance of strengthening; water current perturbations provides challenge to standing balance unsupported, requiring increased core activation.                  PT Education - 06/20/21 1555     Education Details reviewed stretchies and exercises for home    Person(s) Educated Patient    Methods Explanation;Demonstration;Tactile cues;Verbal cues  Comprehension Verbalized understanding;Returned demonstration;Verbal cues required;Tactile cues required              PT Short Term Goals - 05/26/21 0701       PT SHORT TERM GOAL #1   Title Pt will be ind with initial HEP    Baseline has basic HEP    Time 3    Period Weeks    Status Achieved      PT SHORT TERM GOAL #2   Title Patient will stand with spine in neutral without increased pain    Baseline has to force himself into a neutral position    Time 3    Period Weeks    Status On-going    Target Date 05/05/21      PT SHORT TERM GOAL #3   Title Patient will deomnstrate 5/5 gross LE strength    Time 3    Period Weeks    Status On-going    Target Date 05/05/21                      Plan - 06/20/21 1018     Clinical Impression Statement Patient had a large spasm in his QL today. He responsed well to manual therapy. He continues to  respond well to manual therapy but the pain returns. He will see a GI MD tomorrow. He continues to feel like there is conection between food and BM's as far as his pain in his back. He Continues to work on his exercises at home.    Personal Factors and Comorbidities Time since onset of injury/illness/exacerbation    Examination-Activity Limitations Bed Mobility;Sit;Squat;Stairs;Stand    Examination-Participation Restrictions Community Activity;Shop;Church    Stability/Clinical Decision Making Stable/Uncomplicated    Clinical Decision Making Low    Rehab Potential Good    PT Frequency 2x / week    PT Duration 6 weeks    PT Treatment/Interventions ADLs/Self Care Home Management;Electrical Stimulation;Iontophoresis '4mg'$ /ml Dexamethasone;Ultrasound;Gait training;Stair training;Functional mobility training;Therapeutic activities;Therapeutic exercise;Neuromuscular re-education;Patient/family education;Manual techniques;Dry needling;Passive range of motion;Taping    PT Next Visit Plan manual therapt to correct shift to the right. manual therapy to lumbar paraspinals; needling to paraspinals. review base core strengthening exercises; review wall shift correction, consider dry needling when land therapy.    PT Home Exercise Plan Access Code: [email or web address]  URL: [email or web address]  Date: 04/14/2021  Prepared by: Carolyne Littles    Exercises  Supine Piriformis Stretch with Foot on Ground - 2 x daily - 7 x weekly - 3 sets - 3 reps - 20 hold  Hooklying Single Knee to Chest Stretch - 2 x daily - 7 x weekly - 3 sets - 3 reps - 20 hold  Standing Glute Med Mobilization with Small Ball on Wall - 1 x daily - 7 x weekly - 3 sets - 10 reps    Consulted and Agree with Plan of Care Patient             Patient will benefit from skilled therapeutic intervention in order to improve the following deficits and impairments:  Abnormal gait, Decreased knowledge of use of DME, Pain, Increased fascial restricitons,  Decreased mobility, Increased muscle spasms, Decreased activity tolerance, Decreased range of motion, Decreased strength, Difficulty walking  Visit Diagnosis: Muscle weakness (generalized)  Chronic right-sided low back pain without sciatica  Muscle spasm of back  Abnormal posture     Problem List Patient Active Problem List   Diagnosis Date Noted   Low back pain 02/16/2021  Preventive measure 12/18/2012   Stress and adjustment reaction 12/18/2012    Carney Living 06/20/2021, 3:58 PM  [facility] [address] [place], [place], [address] Phone: [phone]   Fax:  [phone]  Name: Edwards Mindel MRN: [id number] Date of Birth: [birth year]

## 2021-06-21 ENCOUNTER — Ambulatory Visit (INDEPENDENT_AMBULATORY_CARE_PROVIDER_SITE_OTHER): Payer: Federal, State, Local not specified - PPO | Admitting: Gastroenterology

## 2021-06-21 ENCOUNTER — Encounter: Payer: Self-pay | Admitting: Gastroenterology

## 2021-06-21 VITALS — BP 128/84 | HR 52 | Ht 75.0 in | Wt 223.0 lb

## 2021-06-21 DIAGNOSIS — R140 Abdominal distension (gaseous): Secondary | ICD-10-CM

## 2021-06-21 NOTE — Patient Instructions (Addendum)
If you are age 63 or older, your body mass index should be between 23-30. Your Body mass index is 27.87 kg/m. If this is out of the aforementioned range listed, please consider follow up with your Primary Care Provider.  If you are age 39 or younger, your body mass index should be between 19-25. Your Body mass index is 27.87 kg/m. If this is out of the aformentioned range listed, please consider follow up with your Primary Care Provider.   __________________________________________________________  The [facility] providers would like to encourage you to use [facility] to communicate with providers for non-urgent requests or questions.  Due to long hold times on the telephone, sending your provider a message by [facility] may be a faster and more efficient way to get a response.  Please allow 48 business hours for a response.  Please remember that this is for non-urgent requests.   Please call us as needed.   It was a pleasure to see you today!  Thank you for trusting me with your gastrointestinal care!

## 2021-06-21 NOTE — Progress Notes (Signed)
[facility] Consult Note:  History: Michael [name] 06/21/2021  Referring provider: [name], MD  Reason for consult/chief complaint: Abdominal Pain (Lower abdominal pain when standing up and before eating. Pain gets better when eating something "Bulky" and sleeping on left side. )   Subjective  HPI:  This is a very pleasant 65 year old man referred by primary care for abdominal pain.  For the last couple of years he has seen primary care and sports medicine for various orthopedic symptoms.  With that, he describes that it is sometimes difficult for him to go from a seated to a standing position, he does so slowly and may need to pull himself up.  However, he finds that if he eats certain foods "with bulk" he does not have as much difficulty.  He has pain laying on the right side and then sometimes on the left, needs to shift positions sleeping overnight.  He feels as if there may be a pressure in the right side of the rib cage and wonders if his liver is enlarged with her some other digestive problem.  He also wonder if there might be something about the "nutrients" or possible "contaminants" in food that could be contributing to these problems. His bowel habits are regular without rectal bleeding.  He denies nausea vomiting dysphagia early satiety or weight loss.  Colonoscopy report by Dr. Saralyn [name] on 08/13/2018.  Screening exam, reportedly prior colonoscopy in September 2009.  Complete exam to the cecum, good preparation.  3 mm transverse colon polyp removed, of unknown pathology.  Report gave recommendation for recall exam in 5 years and a subsequent handwritten note on 10/13/2018 also recommends October 2024.  ROS:  Review of Systems  Constitutional:  Negative for appetite change and unexpected weight change.  HENT:  Negative for mouth sores and voice change.   Eyes:  Negative for pain and redness.  Respiratory:  Negative for cough and shortness of  breath.   Cardiovascular:  Negative for chest pain and palpitations.  Genitourinary:  Negative for dysuria and hematuria.  Musculoskeletal:  Positive for arthralgias and back pain. Negative for myalgias.  Skin:  Negative for pallor and rash.  Neurological:  Negative for weakness and headaches.  Hematological:  Negative for adenopathy.    Past Medical History: History reviewed. No pertinent past medical history.   Past Surgical History: Past Surgical History:  Procedure Laterality Date   LUMBAR [place] SURGERY  2014   right ankle fracture  1978   external pinning     Family History: Family History  Problem Relation Age of Onset   High blood pressure Mother     Social History: Social History   Socioeconomic History   Marital status: Single    Spouse name: Not on file   Number of children: Not on file   Years of education: Not on file   Highest education level: Not on file  Occupational History   Not on file  Tobacco Use   Smoking status: Never   Smokeless tobacco: Never  Vaping Use   Vaping Use: Never used  Substance and Sexual Activity   Alcohol use: Yes    Comment: rare   Drug use: No   Sexual activity: Yes  Other Topics Concern   Not on file  Social History Narrative   Not on file   Social Determinants of Health   Financial Resource Strain: Not on file  Food Insecurity: Not on file  Transportation Needs: Not on file  Physical Activity: Not on file  Stress: Not on file  Social Connections: Not on file    Allergies: No Known Allergies  Outpatient Meds: Current Outpatient Medications  Medication Sig Dispense Refill   Blood Pressure Monitoring (ADULT BLOOD PRESSURE CUFF LG) KIT 1 Device by Does not apply route daily. 1 each 0   gabapentin (NEURONTIN) 100 MG capsule Take 2 capsules (200 mg total) by mouth at bedtime. 180 capsule 0   No current facility-administered medications for this visit.       ___________________________________________________________________ Objective   Exam:  BP 128/84   Pulse (!) 52   Ht $R'6\' 3"'Yh$  (1.905 m)   Wt 223 lb (101.2 kg)   BMI 27.87 kg/m  Wt Readings from Last 3 Encounters:  06/21/21 223 lb (101.2 kg)  05/18/21 225 lb 3.2 oz (102.2 kg)  03/30/21 223 lb (101.2 kg)    General: Well-appearing.  Antalgic gait.  Has back pain changing positions on exam table Eyes: sclera anicteric, no redness ENT: oral mucosa moist without lesions, no cervical or supraclavicular lymphadenopathy CV: RRR without murmur, S1/S2, no JVD, no peripheral edema Resp: clear to auscultation bilaterally, normal RR and effort noted GI: soft, no tenderness, with active bowel sounds. No guarding or palpable organomegaly noted. Skin; warm and dry, no rash or jaundice noted Neuro: awake, alert and oriented x 3. Normal gross motor function and fluent speech  Labs:  CBC Latest Ref Rng & Units 02/01/2021 11/10/2019 09/24/2018  WBC 4.0 - 10.5 K/uL 4.1 6.6 4.6  Hemoglobin 13.0 - 17.0 g/dL 13.9 14.6 14.9  Hematocrit 39.0 - 52.0 % 42.2 45.3 45.3  Platelets 150.0 - 400.0 K/uL 131.0(L) 154.0 202.0   CMP Latest Ref Rng & Units 02/01/2021 11/10/2019 09/24/2018  Glucose 70 - 99 mg/dL 88 101(H) 84  BUN 6 - 23 mg/dL $Remove'14 21 14  'sWPIPej$ Creatinine 0.40 - 1.50 mg/dL 0.82 0.83 0.82  Sodium 135 - 145 mEq/L 141 141 139  Potassium 3.5 - 5.1 mEq/L 4.3 4.1 4.4  Chloride 96 - 112 mEq/L 110 107 105  CO2 19 - 32 mEq/L $Remove'26 29 28  'ysHBctL$ Calcium 8.4 - 10.5 mg/dL 8.9 9.0 9.5  Total Protein 6.0 - 8.3 g/dL 6.5 6.8 7.4  Total Bilirubin 0.2 - 1.2 mg/dL 0.5 0.4 0.8  Alkaline Phos 39 - 117 U/L 68 73 74  AST 0 - 37 U/L 16 15 33  ALT 0 - 53 U/L $Remo'11 13 23     'yWImG$ Radiologic Studies:  No abdominal imaging  Assessment: Encounter Diagnosis  Name Primary?   Abdominal bloating Yes   While it is clear that Michael [name] has multiple musculoskeletal pain symptoms, it was admittedly difficult to determine what his concerns were  related to food or other digestive issues that might be contributing to them.  He mostly seem focused on the possibility of something in his abdominal cavity that could be causing this feeling of fullness along the right side of the rib cage that sometimes makes it uncomfortable to lay on that side or on the other side.  I feel no abdominal mass or hepatomegaly.  Overall, he does not seem to have any primary digestive disorder condition and no localizing GI symptoms.  Plan:  I did my best to reassure him he most likely does not have any digestive disorder and I do not think further abdominal imaging or endoscopic work-up would be revealing.  He will continue to see Dr. Benson Norway for his regularly scheduled screening colonoscopies as recommended.  Thank  you for the courtesy of this consult.  Please call me with any questions or concerns.  Nelida Meuse III  CC: Referring provider noted above

## 2021-07-04 ENCOUNTER — Encounter (HOSPITAL_BASED_OUTPATIENT_CLINIC_OR_DEPARTMENT_OTHER): Payer: Self-pay | Admitting: Physical Therapy

## 2021-07-04 ENCOUNTER — Ambulatory Visit (HOSPITAL_BASED_OUTPATIENT_CLINIC_OR_DEPARTMENT_OTHER): Payer: Federal, State, Local not specified - PPO | Attending: Family Medicine | Admitting: Physical Therapy

## 2021-07-04 ENCOUNTER — Other Ambulatory Visit: Payer: Self-pay

## 2021-07-04 DIAGNOSIS — M545 Low back pain, unspecified: Secondary | ICD-10-CM | POA: Diagnosis not present

## 2021-07-04 DIAGNOSIS — M62830 Muscle spasm of back: Secondary | ICD-10-CM | POA: Diagnosis not present

## 2021-07-04 DIAGNOSIS — R293 Abnormal posture: Secondary | ICD-10-CM | POA: Insufficient documentation

## 2021-07-04 DIAGNOSIS — G8929 Other chronic pain: Secondary | ICD-10-CM | POA: Diagnosis not present

## 2021-07-04 DIAGNOSIS — M6281 Muscle weakness (generalized): Secondary | ICD-10-CM | POA: Insufficient documentation

## 2021-07-04 NOTE — Therapy (Signed)
[place], [place], [address] Phone: [phone]   Fax:  [phone]  Physical Therapy Treatment  Patient Details  Name: Michael [name] MRN: [id number] Date of Birth: [birth year] Referring Provider (PT): Dr Charlann Boxer   Encounter Date: 07/04/2021   PT End of Session - 07/04/21 1254     Visit Number 10    Number of Visits 12    Date for PT Re-Evaluation 07/12/21    Authorization Type BCBS    PT Start Time 0930    PT Stop Time 1013    PT Time Calculation (min) 43 min    Activity Tolerance Patient tolerated treatment well    Behavior During Therapy [facility] for tasks assessed/performed             History reviewed. No pertinent past medical history.  Past Surgical History:  Procedure Laterality Date   LUMBAR [place] SURGERY  2014   right ankle fracture  1978   external pinning    There were no vitals filed for this visit.   Subjective Assessment - 07/04/21 1248     Subjective Patient has been to the GI doc who gave him very little information. He feels like overall he is standing better with less pain and is standing straighter. He still feels like his pain improves when he eats and has bowel movements.    Pertinent History nothing    How long can you sit comfortably? stiffens when he sits    How long can you stand comfortably? prior to the epidural he had pain    Currently in Pain? Yes    Pain Score 4     Pain Location Back    Pain Orientation Right    Pain Descriptors / Indicators Aching    Pain Type Chronic pain    Pain Onset More than a month ago    Pain Frequency Intermittent    Aggravating Factors  walking    Pain Relieving Factors urination and eating large meals    Multiple Pain Sites No                       Pt seen for aquatic therapy today.  Treatment took place in water 4-4.5 ft in depth at the [facility]. Temp of water was 91.  Pt entered/exited  the pool via stairs (step through pattern) independently with bilat rail.     Warm up: heel/toe walking x4 laps across pool chest deep side stepping x4 laps from shallow to deep; reverse walking x2. Lunge walking forward 2 laps     Exercises; Slow march x20; x20; squats x20; hip extension x20; hip abduction x20; Sit to stand x20; all with 5.5 pound weight    Yellow weight: pull down and back; balance in palnk x10 10 sec hold; rotate down with weight 2x10    Step up and hold 2x10;   QL stretch from standing on wall 3x20 sec hold to the right    Standing side glide to right mod cuing for technique 15x 5 sec hold   Manual: Manual trigger point release to QL;push and pull press in float position    Pt requires buoyancy for support and to offload joints with strengthening exercises. Viscosity of the water is needed for resistance of strengthening; water current perturbations provides challenge to standing balance unsupported, requiring increased core activation.  PT Education - 07/04/21 1253     Education Details HEP and symtpom mangement    Person(s) Educated Patient    Methods Explanation;Demonstration;Tactile cues;Verbal cues    Comprehension Verbalized understanding;Returned demonstration;Verbal cues required;Tactile cues required              PT Short Term Goals - 05/26/21 0701       PT SHORT TERM GOAL #1   Title Pt will be ind with initial HEP    Baseline has basic HEP    Time 3    Period Weeks    Status Achieved      PT SHORT TERM GOAL #2   Title Patient will stand with spine in neutral without increased pain    Baseline has to force himself into a neutral position    Time 3    Period Weeks    Status On-going    Target Date 05/05/21      PT SHORT TERM GOAL #3   Title Patient will deomnstrate 5/5 gross LE strength    Time 3    Period Weeks    Status On-going    Target Date 05/05/21                      Plan -  07/04/21 1255     Clinical Impression Statement Patient continues to have a large spasmin in his right lower lumbar spine. He feels like his strength is improving and his ability to stand is improving but he still has daily pain. Therapy focused on posterior chain strengthening and core strengthening with the water weights today. Therapy also continues to perfrom manual therapy to patients back.    Personal Factors and Comorbidities Time since onset of injury/illness/exacerbation    Examination-Activity Limitations Bed Mobility;Sit;Squat;Stairs;Stand    Examination-Participation Restrictions Community Activity;Shop;Church    Stability/Clinical Decision Making Stable/Uncomplicated    Clinical Decision Making Low    Rehab Potential Good    PT Frequency 2x / week    PT Duration 6 weeks    PT Treatment/Interventions ADLs/Self Care Home Management;Electrical Stimulation;Iontophoresis '4mg'$ /ml Dexamethasone;Ultrasound;Gait training;Stair training;Functional mobility training;Therapeutic activities;Therapeutic exercise;Neuromuscular re-education;Patient/family education;Manual techniques;Dry needling;Passive range of motion;Taping    PT Next Visit Plan manual therapt to correct shift to the right. manual therapy to lumbar paraspinals; needling to paraspinals. review base core strengthening exercises; review wall shift correction, consider dry needling when land therapy.    PT Home Exercise Plan Access Code: [email or web address]  URL: [email or web address]  Date: 04/14/2021  Prepared by: Carolyne Littles    Exercises  Supine Piriformis Stretch with Foot on Ground - 2 x daily - 7 x weekly - 3 sets - 3 reps - 20 hold  Hooklying Single Knee to Chest Stretch - 2 x daily - 7 x weekly - 3 sets - 3 reps - 20 hold  Standing Glute Med Mobilization with Small Ball on Wall - 1 x daily - 7 x weekly - 3 sets - 10 reps    Consulted and Agree with Plan of Care Patient             Patient will benefit from skilled  therapeutic intervention in order to improve the following deficits and impairments:  Abnormal gait, Decreased knowledge of use of DME, Pain, Increased fascial restricitons, Decreased mobility, Increased muscle spasms, Decreased activity tolerance, Decreased range of motion, Decreased strength, Difficulty walking  Visit Diagnosis: No diagnosis found.     Problem List Patient Active Problem List   Diagnosis Date  Noted   Low back pain 02/16/2021   Preventive measure 12/18/2012   Stress and adjustment reaction 12/18/2012    Carney Living PT DPT  07/04/2021, 1:29 PM  [facility] [address] [place], [place], [address] Phone: [phone]   Fax:  [phone]  Name: Siddh Crenshaw MRN: [id number] Date of Birth: 02-21-1956

## 2021-07-13 ENCOUNTER — Ambulatory Visit (HOSPITAL_BASED_OUTPATIENT_CLINIC_OR_DEPARTMENT_OTHER): Payer: Federal, State, Local not specified - PPO | Admitting: Physical Therapy

## 2021-07-13 ENCOUNTER — Other Ambulatory Visit: Payer: Self-pay

## 2021-07-13 DIAGNOSIS — R293 Abnormal posture: Secondary | ICD-10-CM

## 2021-07-13 DIAGNOSIS — M545 Low back pain, unspecified: Secondary | ICD-10-CM

## 2021-07-13 DIAGNOSIS — G8929 Other chronic pain: Secondary | ICD-10-CM

## 2021-07-13 DIAGNOSIS — M6281 Muscle weakness (generalized): Secondary | ICD-10-CM | POA: Diagnosis not present

## 2021-07-13 DIAGNOSIS — M62830 Muscle spasm of back: Secondary | ICD-10-CM

## 2021-07-14 ENCOUNTER — Encounter (HOSPITAL_BASED_OUTPATIENT_CLINIC_OR_DEPARTMENT_OTHER): Payer: Self-pay | Admitting: Physical Therapy

## 2021-07-14 NOTE — Therapy (Addendum)
[place], [place], [address] Phone: [phone]   Fax:  [phone]  Physical Therapy Treatment/discharge   Patient Details  Name: Michael [name] MRN: [id number] Date of Birth: 07-Jul-1956 Referring Provider (PT): Dr Charlann Boxer   Encounter Date: 07/13/2021   PT End of Session - 07/14/21 0803     Visit Number 11    Number of Visits 12    Date for PT Re-Evaluation 07/21/21    Authorization Type BCBS    PT Start Time 0930    PT Stop Time 1012    PT Time Calculation (min) 42 min    Activity Tolerance Patient tolerated treatment well    Behavior During Therapy [facility] for tasks assessed/performed             History reviewed. No pertinent past medical history.  Past Surgical History:  Procedure Laterality Date   LUMBAR [place] SURGERY  2014   right ankle fracture  1978   external pinning    There were no vitals filed for this visit.   Subjective Assessment - 07/14/21 0800     Subjective Patient reports he has been able to mow the lawn and do some other things with less pain. His pain still seems to be tied to what he is eating and activity. He comes into the clinic standing straighter then he has in the past.    Pertinent History nothing    How long can you sit comfortably? stiffens when he sits    How long can you stand comfortably? prior to the epidural he had pain    Currently in Pain? Yes    Pain Score 2     Pain Location Back    Pain Orientation Right    Pain Descriptors / Indicators Aching    Pain Type Chronic pain    Pain Onset More than a month ago    Pain Frequency Intermittent    Aggravating Factors  walking    Pain Relieving Factors urination and eating a large meal    Multiple Pain Sites No                               [facility] Adult PT Treatment/Exercise - 07/14/21 0001       Lumbar Exercises: Stretches   Lower Trunk Rotation Limitations x20       Lumbar Exercises: Machines for Strengthening   Other Lumbar Machine Exercise cable row 3x10 20lbs shoulder extension 3x10 20lbs; pallof press 2x10 each side; chop down 2x10 each side 10 lbs leg press x2 ech leg.      Manual Therapy   Manual Therapy Soft tissue mobilization;Joint mobilization    Manual therapy comments skilled palpation of trigger points    Joint Mobilization grade III and IV    Soft tissue mobilization IASTYM to QL lower lumbar paraspianls              Trigger Point Dry Needling - 07/14/21 0001     Consent Given? Yes    Education Handout Provided Yes    Muscles Treated Back/Hip Lumbar multifidi    Other Dry Needling 3 spotys in right lumbar multifidi    Lumbar multifidi Response Twitch response elicited;Palpable increased muscle length                   PT Education - 07/14/21 0803     Education Details reviewed gym exercises  and concepts fo rthe gym    Person(s) Educated Patient    Methods Explanation;Demonstration;Tactile cues;Verbal cues    Comprehension Verbalized understanding;Returned demonstration;Verbal cues required;Tactile cues required              PT Short Term Goals - 07/14/21 1224       PT SHORT TERM GOAL #1   Title Pt will be ind with initial HEP    Baseline has basic HEP    Time 3    Period Weeks    Status Achieved      PT SHORT TERM GOAL #2   Title Patient will stand with spine in neutral without increased pain    Baseline much improved standing posture    Time 3    Period Weeks    Status Achieved    Target Date 05/05/21      PT SHORT TERM GOAL #3   Title Patient will deomnstrate 5/5 gross LE strength    Baseline 5/5    Time 3    Period Weeks    Status Achieved    Target Date 05/05/21               PT Long Term Goals - 07/14/21 1225       PT LONG TERM GOAL #1   Title Patient will stand for 1 hour without pain in order to perfrom ADL's    Time 6    Period Weeks    Status New    Target Date  08/25/21      PT LONG TERM GOAL #2   Title Patient will perfrom yard work without pain    Time 6    Period Weeks    Status New    Target Date 08/25/21                   Plan - 07/14/21 1221     Clinical Impression Statement Patient continues to make progress as far as his fucntion and pain He continues to have pain. his pain continues to be effected by food and bowel patterns, but also activity. He tolerated dry needling and soft tissue mobilization well. Therapy reviewed gym exercises with him tosay. He was advised to continue with his stretches and exercises at home. He will see the MD next week. He may contnue with PT, but he can also continue with his home exercises to see how he advances. Therapy will leave his chart open for 3-4 weeks.    Personal Factors and Comorbidities Time since onset of injury/illness/exacerbation    Examination-Activity Limitations Bed Mobility;Sit;Squat;Stairs;Stand    Examination-Participation Restrictions Community Activity;Shop;Church    Stability/Clinical Decision Making Stable/Uncomplicated    Clinical Decision Making Low    Rehab Potential Good    PT Frequency 2x / week    PT Duration 6 weeks    PT Treatment/Interventions ADLs/Self Care Home Management;Electrical Stimulation;Iontophoresis 38m/ml Dexamethasone;Ultrasound;Gait training;Stair training;Functional mobility training;Therapeutic activities;Therapeutic exercise;Neuromuscular re-education;Patient/family education;Manual techniques;Dry needling;Passive range of motion;Taping    PT Next Visit Plan manual therapt to correct shift to the right. manual therapy to lumbar paraspinals; needling to paraspinals. review base core strengthening exercises; review wall shift correction, consider dry needling when land therapy.    PT Home Exercise Plan Access Code: [email or web address]  URL: [email or web address]  Date: 04/14/2021  Prepared by: DCarolyne Littles   Exercises  Supine Piriformis Stretch with  Foot on Ground - 2 x daily - 7 x weekly - 3 sets - 3 reps - 20 hold  Hooklying Single Knee to Chest Stretch - 2 x daily - 7 x weekly - 3 sets - 3 reps - 20 hold  Standing Glute Med Mobilization with Small Ball on Wall - 1 x daily - 7 x weekly - 3 sets - 10 reps    Consulted and Agree with Plan of Care Patient             Patient will benefit from skilled therapeutic intervention in order to improve the following deficits and impairments:  Abnormal gait, Decreased knowledge of use of DME, Pain, Increased fascial restricitons, Decreased mobility, Increased muscle spasms, Decreased activity tolerance, Decreased range of motion, Decreased strength, Difficulty walking  Visit Diagnosis: Muscle weakness (generalized)  Chronic right-sided low back pain without sciatica  Muscle spasm of back  Abnormal posture  PHYSICAL THERAPY DISCHARGE SUMMARY  Visits from Start of Care: 11  Current functional level related to goals / functional outcomes: Improved pain but still having pain   Remaining deficits: Pain with activity   Education / Equipment: HEP    Patient agrees to discharge. Patient goals were not met. Patient is being discharged due to being pleased with the current functional level.    Problem List Patient Active Problem List   Diagnosis Date Noted   Low back pain 02/16/2021   Preventive measure 12/18/2012   Stress and adjustment reaction 12/18/2012    Carney Living, PT 07/14/2021, 12:29 PM  [facility] [address] [place], [place], [address] Phone: [phone]   Fax:  [phone]  Name: Michael [name] MRN: [id number] Date of Birth: 1956/03/10

## 2021-07-19 NOTE — Progress Notes (Signed)
[place] Phone: [phone] Subjective:   Michael [name], am serving as a scribe for Dr. Hulan [name].  This visit occurred during the SARS-CoV-2 public health emergency.  Safety protocols were in place, including screening questions prior to the visit, additional usage of staff PPE, and extensive cleaning of exam room while observing appropriate contact time as indicated for disinfecting solutions.    I'm seeing this patient by the request  of:  Michael Macadam, MD  CC: Back pain follow-up  [id number]  05/18/2021 Greater thanPatient has made improvement.  Patient does have improvement in range of motion.  Still has significant loss of lordosis and does have difficulty with flexion of the back.  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  We did discuss with patient that we should consider the possibility of nerve conduction test as well.  Patient though at this point because he is making improvement would like to hold on that but we will consider that at follow-up.  Can also consider the possibility of an epidural.  Also discussed with patient about the possibility with patient having some gastroenterology type pathology that may be considering referral to a GI.  Patient declined at this time.  Total time reviewing patient's chart including physical therapy notes, previous imaging in my notes 35 minutes.   Update 07/20/2021 Michael [name] is a 65 y.o. male coming in with complaint of lumbar spine pain. Patient has continued PT. Patient states that he is becoming more flexible and his pain is improving but still there.  Patient feels that physical therapy has been very beneficial.  Patient states though that he still cannot quite reach over to the left side.  Still  noted that he did make some improvement initially with the epidural and since then has only been slowly improving.  Patient saw gastroenterology but feels that there was not a plan that came out of that visit.        [name] past medical history on file. Past Surgical History:  Procedure Laterality Date   LUMBAR [place] SURGERY  2014   right ankle fracture  1978   external pinning   Social History   Socioeconomic History   Marital status: Single    Spouse name: Not on file   Number of children: Not on file   Years of education: Not on file   Highest education level: Not on file  Occupational History   Not on file  Tobacco Use   Smoking status: Never   Smokeless tobacco: Never  Vaping Use   Vaping Use: Never used  Substance and Sexual Activity   Alcohol use: Yes    Comment: rare   Drug use: [name]   Sexual activity: Yes  Other Topics Concern   Not on file  Social History Narrative   Not on file   Social Determinants of Health   Financial Resource Strain: Not on file  Food Insecurity: Not on file  Transportation Needs: Not on file  Physical Activity: Not on file  Stress: Not on file  Social Connections: Not on file   [name] Known Allergies Family History  Problem Relation Age of Onset   High blood pressure Mother          Current Outpatient Medications (Other):    Blood  Pressure Monitoring (ADULT BLOOD PRESSURE CUFF LG) KIT, 1 Device by Does not apply route daily.   gabapentin (NEURONTIN) 100 MG capsule, Take 2 capsules (200 mg total) by mouth at bedtime.   Reviewed prior external information including notes and imaging from  primary care provider As well as notes that were available from care everywhere and other healthcare systems.  Past medical history, social, surgical and family history all reviewed in electronic medical record.  [name] pertanent information unless stated regarding to the chief complaint.   Review of Systems:  [name] headache, visual changes,  nausea, vomiting, diarrhea, constipation, dizziness skin rash, fevers, chills, night sweats, weight loss, swollen lymph nodes, joint swelling, chest pain, shortness of breath, mood changes. POSITIVE muscle aches, abdominal pain, body aches  Objective  Blood pressure 140/86, pulse 70, height _0  (1.905 m), weight 226 lb (102.5 kg), SpO2 97 %.   General: [name] apparent distress alert and oriented x3 mood and affect normal, dressed appropriately.  HEENT: Pupils equal, extraocular movements intact  Respiratory: Patient's speak in full sentences and does not appear short of breath  Cardiovascular: [name] lower extremity edema, non tender, [name] erythema  Gait mild antalgic gait MSK: Low back exam does have still tightness noted.  Patient is unable to touch his toes.  Patient does have more tightness on the left side neurovascular intact distally.    Impression and Recommendations:     The above documentation has been reviewed and is accurate and complete Michael Pulley, DO

## 2021-07-20 ENCOUNTER — Ambulatory Visit (INDEPENDENT_AMBULATORY_CARE_PROVIDER_SITE_OTHER): Payer: Federal, State, Local not specified - PPO | Admitting: Family Medicine

## 2021-07-20 ENCOUNTER — Other Ambulatory Visit: Payer: Self-pay

## 2021-07-20 VITALS — BP 140/86 | HR 70 | Ht 75.0 in | Wt 226.0 lb

## 2021-07-20 DIAGNOSIS — M5441 Lumbago with sciatica, right side: Secondary | ICD-10-CM | POA: Diagnosis not present

## 2021-07-20 DIAGNOSIS — M5442 Lumbago with sciatica, left side: Secondary | ICD-10-CM

## 2021-07-20 DIAGNOSIS — M2550 Pain in unspecified joint: Secondary | ICD-10-CM

## 2021-07-20 DIAGNOSIS — M5416 Radiculopathy, lumbar region: Secondary | ICD-10-CM

## 2021-07-20 DIAGNOSIS — G8929 Other chronic pain: Secondary | ICD-10-CM

## 2021-07-20 LAB — CBC WITH DIFFERENTIAL/PLATELET
Basophils Absolute: 0 10*3/uL (ref 0.0–0.1)
Basophils Relative: 0.5 % (ref 0.0–3.0)
Eosinophils Absolute: 0.2 10*3/uL (ref 0.0–0.7)
Eosinophils Relative: 3.7 % (ref 0.0–5.0)
HCT: 43.2 % (ref 39.0–52.0)
Hemoglobin: 14.2 g/dL (ref 13.0–17.0)
Lymphocytes Relative: 33.7 % (ref 12.0–46.0)
Lymphs Abs: 1.9 10*3/uL (ref 0.7–4.0)
MCHC: 32.9 g/dL (ref 30.0–36.0)
MCV: 68.6 fl — ABNORMAL LOW (ref 78.0–100.0)
Monocytes Absolute: 0.4 10*3/uL (ref 0.1–1.0)
Monocytes Relative: 7.5 % (ref 3.0–12.0)
Neutro Abs: 3.1 10*3/uL (ref 1.4–7.7)
Neutrophils Relative %: 54.6 % (ref 43.0–77.0)
Platelets: 154 10*3/uL (ref 150.0–400.0)
RBC: 6.3 Mil/uL — ABNORMAL HIGH (ref 4.22–5.81)
RDW: 15.8 % — ABNORMAL HIGH (ref 11.5–15.5)
WBC: 5.7 10*3/uL (ref 4.0–10.5)

## 2021-07-20 LAB — SEDIMENTATION RATE: Sed Rate: 23 mm/hr — ABNORMAL HIGH (ref 0–20)

## 2021-07-20 LAB — COMPREHENSIVE METABOLIC PANEL
ALT: 11 U/L (ref 0–53)
AST: 16 U/L (ref 0–37)
Albumin: 3.9 g/dL (ref 3.5–5.2)
Alkaline Phosphatase: 77 U/L (ref 39–117)
BUN: 20 mg/dL (ref 6–23)
CO2: 26 mEq/L (ref 19–32)
Calcium: 9 mg/dL (ref 8.4–10.5)
Chloride: 108 mEq/L (ref 96–112)
Creatinine, Ser: 0.82 mg/dL (ref 0.40–1.50)
GFR: 93.43 mL/min (ref >60.00)
Glucose, Bld: 91 mg/dL (ref 70–99)
Potassium: 3.9 mEq/L (ref 3.5–5.1)
Sodium: 140 mEq/L (ref 135–145)
Total Bilirubin: 0.8 mg/dL (ref 0.2–1.2)
Total Protein: 7 g/dL (ref 6.0–8.3)

## 2021-07-20 LAB — VITAMIN D 25 HYDROXY (VIT D DEFICIENCY, FRACTURES): VITD: 15.81 ng/mL — ABNORMAL LOW (ref 30.00–100.00)

## 2021-07-20 LAB — PSA: PSA: 4.66 ng/mL — ABNORMAL HIGH (ref 0.10–4.00)

## 2021-07-20 LAB — TESTOSTERONE: Testosterone: 389.6 ng/dL (ref 300.00–890.00)

## 2021-07-20 NOTE — Patient Instructions (Addendum)
Labs today Singulair Albendazole look into these medications Epidural [phone] See me 4 weeks after epidural

## 2021-07-20 NOTE — Assessment & Plan Note (Signed)
Continues to have low back pain and significant tightness noted in the hamstring.  MRI of the lumbar spine did show some degenerative disc disease and did respond well to an epidural previously.  We will repeat the epidural to see if this can some more benefit.  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  This has been nearly 6 months.  In addition to this we discussed that if this does not seem to make a big difference I would consider possible hamstring tendon injections approximately at follow-up.  Patient will follow up with me again in 2 months otherwise.

## 2021-07-21 ENCOUNTER — Ambulatory Visit
Admission: RE | Admit: 2021-07-21 | Discharge: 2021-07-21 | Disposition: A | Discharge status: Home / Self Care | Payer: Federal, State, Local not specified - PPO | Source: Ambulatory Visit | Attending: Family Medicine | Admitting: Family Medicine

## 2021-07-21 DIAGNOSIS — M4727 Other spondylosis with radiculopathy, lumbosacral region: Secondary | ICD-10-CM | POA: Diagnosis not present

## 2021-07-21 DIAGNOSIS — M5416 Radiculopathy, lumbar region: Secondary | ICD-10-CM

## 2021-07-21 MED ORDER — METHYLPREDNISOLONE ACETATE 40 MG/ML INJ SUSP (RADIOLOG
80.0000 mg | Freq: Once | INTRAMUSCULAR | Status: AC
  Administered 2021-07-21: 80 mg via EPIDURAL

## 2021-07-21 MED ORDER — IOPAMIDOL (ISOVUE-M 200) INJECTION 41%
1.0000 mL | Freq: Once | INTRAMUSCULAR | Status: AC
  Administered 2021-07-21: 1 mL via EPIDURAL

## 2021-07-21 NOTE — Discharge Instructions (Signed)

## 2021-08-16 NOTE — Progress Notes (Deleted)
[place] Phone: [phone] Subjective:    I'm seeing this patient by the request  of:  [name], MD  CC:   [id number]  07/20/2021 Continues to have low back pain and significant tightness noted in the hamstring.  MRI of the lumbar spine did show some degenerative disc disease and did respond well to an epidural previously.  We will repeat the epidural to see if this can some more benefit.  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  This has been nearly 6 months.  In addition to this we discussed that if this does not seem to make a big difference I would consider possible hamstring tendon injections approximately at follow-up.  Patient will follow up with me again in 2 months otherwise.  Updated 08/17/21 Michael [name] is a 65 y.o. male coming in with complaint of LBP. Epi on 07/21/2021  Onset-  Location Duration-  Character- Aggravating factors- Reliving factors-  Therapies tried-  Severity-     No past medical history on file. Past Surgical History:  Procedure Laterality Date   LUMBAR [place] SURGERY  2014   right ankle fracture  1978   external pinning   Social History   Socioeconomic History   Marital status: Single    Spouse name: Not on file   Number of children: Not on file   Years of education: Not on file   Highest education level: Not on file  Occupational History   Not on file  Tobacco Use   Smoking status: Never   Smokeless tobacco: Never  Vaping Use   Vaping Use: Never used  Substance and Sexual Activity   Alcohol use: Yes    Comment: rare   Drug use: No   Sexual activity: Yes  Other Topics Concern   Not on file  Social History Narrative   Not on file   Social Determinants of Health   Financial Resource Strain: Not on file   Food Insecurity: Not on file  Transportation Needs: Not on file  Physical Activity: Not on file  Stress: Not on file  Social Connections: Not on file   No Known Allergies Family History  Problem Relation Age of Onset   High blood pressure Mother          Current Outpatient Medications (Other):    Blood Pressure Monitoring (ADULT BLOOD PRESSURE CUFF LG) KIT, 1 Device by Does not apply route daily.   gabapentin (NEURONTIN) 100 MG capsule, Take 2 capsules (200 mg total) by mouth at bedtime.   Reviewed prior external information including notes and imaging from  primary care provider As well as notes that were available from care everywhere and other healthcare systems.  Past medical history, social, surgical and family history all reviewed in electronic medical record.  No pertanent information unless stated regarding to the chief complaint.   Review of Systems:  No headache, visual changes, nausea, vomiting, diarrhea, constipation, dizziness, abdominal pain, skin rash, fevers, chills, night sweats, weight loss, swollen lymph nodes, body aches, joint swelling, chest pain, shortness of breath, mood changes. POSITIVE muscle aches  Objective  There were no vitals taken for this visit.   General: No apparent distress alert and oriented x3 mood and affect normal, dressed appropriately.  HEENT: Pupils equal, extraocular movements intact  Respiratory: Patient's  speak in full sentences and does not appear short of breath  Cardiovascular: No lower extremity edema, non tender, no erythema  Gait normal with good balance and coordination.  MSK:  Non tender with full range of motion and good stability and symmetric strength and tone of shoulders, elbows, wrist, hip, knee and ankles bilaterally.     Impression and Recommendations:     The above documentation has been reviewed and is accurate and complete Belva Agee

## 2021-08-17 ENCOUNTER — Ambulatory Visit: Payer: Federal, State, Local not specified - PPO | Admitting: Family Medicine

## 2021-08-23 NOTE — Progress Notes (Deleted)
[place] Phone: [phone] Subjective:    I'm seeing this patient by the request  of:  [name], MD  CC:   [id number]  07/20/2021 Continues to have low back pain and significant tightness noted in the hamstring.  MRI of the lumbar spine did show some degenerative disc disease and did respond well to an epidural previously.  We will repeat the epidural to see if this can some more benefit.  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  This has been nearly 6 months.  In addition to this we discussed that if this does not seem to make a big difference I would consider possible hamstring tendon injections approximately at follow-up.  Patient will follow up with me again in 2 months otherwise.  Update 08/31/2021 Michael [name] is a 65 y.o. male coming in with complaint of LBP. Patient states   Epidural on 07/21/2021     No past medical history on file. Past Surgical History:  Procedure Laterality Date   LUMBAR [place] SURGERY  2014   right ankle fracture  1978   external pinning   Social History   Socioeconomic History   Marital status: Single    Spouse name: Not on file   Number of children: Not on file   Years of education: Not on file   Highest education level: Not on file  Occupational History   Not on file  Tobacco Use   Smoking status: Never   Smokeless tobacco: Never  Vaping Use   Vaping Use: Never used  Substance and Sexual Activity   Alcohol use: Yes    Comment: rare   Drug use: No   Sexual activity: Yes  Other Topics Concern   Not on file  Social History Narrative   Not on file   Social Determinants of Health   Financial Resource Strain: Not on file  Food Insecurity: Not on file  Transportation Needs: Not on file  Physical Activity: Not on  file  Stress: Not on file  Social Connections: Not on file   No Known Allergies Family History  Problem Relation Age of Onset   High blood pressure Mother          Current Outpatient Medications (Other):    Blood Pressure Monitoring (ADULT BLOOD PRESSURE CUFF LG) KIT, 1 Device by Does not apply route daily.   gabapentin (NEURONTIN) 100 MG capsule, Take 2 capsules (200 mg total) by mouth at bedtime.   Reviewed prior external information including notes and imaging from  primary care provider As well as notes that were available from care everywhere and other healthcare systems.  Past medical history, social, surgical and family history all reviewed in electronic medical record.  No pertanent information unless stated regarding to the chief complaint.   Review of Systems:  No headache, visual changes, nausea, vomiting, diarrhea, constipation, dizziness, abdominal pain, skin rash, fevers, chills, night sweats, weight loss, swollen lymph nodes, body aches, joint swelling, chest pain, shortness of breath, mood changes. POSITIVE muscle aches  Objective  There were no vitals taken for this visit.   General: No apparent distress alert and oriented x3 mood and affect normal, dressed appropriately.  HEENT: Pupils equal, extraocular movements intact  Respiratory: Patient's speak in full sentences and does not appear short of breath  Cardiovascular: No lower extremity edema, non tender, no erythema  Gait normal with good balance and coordination.  MSK:  Non tender with full range of motion and good stability and symmetric strength and tone of shoulders, elbows, wrist, hip, knee and ankles bilaterally.     Impression and Recommendations:     The above documentation has been reviewed and is accurate and complete Jacqualin Combes

## 2021-08-31 ENCOUNTER — Ambulatory Visit: Payer: Federal, State, Local not specified - PPO | Admitting: Family Medicine

## 2021-09-06 NOTE — Progress Notes (Signed)
[place] [address] [place] Phone: [phone] Subjective:   IVilma Meckel, am serving as a scribe for Dr. Hulan Saas. This visit occurred during the SARS-CoV-2 public health emergency.  Safety protocols were in place, including screening questions prior to the visit, additional usage of staff PPE, and extensive cleaning of exam room while observing appropriate contact time as indicated for disinfecting solutions.   I'm seeing this patient by the request  of:  [name], MD  CC: Low back pain follow-up  [id number]  07/20/2021 Continues to have low back pain and significant tightness noted in the hamstring.  MRI of the lumbar spine did show some degenerative disc disease and did respond well to an epidural previously.  We will repeat the epidural to see if this can some more benefit.  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  This has been nearly 6 months.  In addition to this we discussed that if this does not seem to make a big difference I would consider possible hamstring tendon injections approximately at follow-up.  Patient will follow up with me again in 2 months otherwise.  Updated 09/07/2021 Krikor Willet is a 65 y.o. male coming in with complaint of LBP. Epi on 07/21/2021. Injection helped a little. Mainly concentrating on pain in lower right quadrant of the abdomin. Not happy with GI doctor and feels need better evaluation being that is his main concern still. Does notice some back pain associated with the stomahc pain and still feels like that should be looked at. At the same time he did make improvement with physical therapy and does get relief from some of the stretching and the epidurals we have done.        No past medical history on file. Past Surgical History:   Procedure Laterality Date   LUMBAR [place] SURGERY  2014   right ankle fracture  1978   external pinning   Social History   Socioeconomic History   Marital status: Single    Spouse name: Not on file   Number of children: Not on file   Years of education: Not on file   Highest education level: Not on file  Occupational History   Not on file  Tobacco Use   Smoking status: Never   Smokeless tobacco: Never  Vaping Use   Vaping Use: Never used  Substance and Sexual Activity   Alcohol use: Yes    Comment: rare   Drug use: No   Sexual activity: Yes  Other Topics Concern   Not on file  Social History Narrative   Not on file   Social Determinants of Health   Financial Resource Strain: Not on file  Food Insecurity: Not on file  Transportation Needs: Not on file  Physical Activity: Not on file  Stress: Not on file  Social Connections: Not on file   No Known Allergies Family History  Problem Relation Age of Onset   High blood pressure Mother          Current Outpatient Medications (Other):    gabapentin (NEURONTIN) 300 MG capsule, Take 1 capsule (300 mg total) by mouth at bedtime.   Blood Pressure Monitoring (ADULT BLOOD PRESSURE CUFF LG) KIT, 1 Device by Does not apply route daily.   Reviewed prior external information including notes and imaging from  primary care provider As well as notes that were available from care everywhere and other healthcare systems.  Past medical history, social, surgical and family history all reviewed in electronic medical record.  No pertanent information unless stated regarding to the chief complaint.   Review of Systems:  No headache, visual changes, nausea, vomiting, diarrhea, constipation, dizziness skin rash, fevers, chills, night sweats, weight loss, swollen lymph nodes,  joint swelling, chest pain, shortness of breath, mood changes. POSITIVE muscle aches, abdominal pain, body aches   Objective  Blood pressure (!) 154/82, pulse  (!) 54, height _0  (1.905 m), weight 239 lb (108.4 kg), SpO2 98 %.   General: No apparent distress alert and oriented x3 mood and affect normal, dressed appropriately.  HEENT: Pupils equal, extraocular movements intact  Respiratory: Patient's speak in full sentences and does not appear short of breath  Cardiovascular: No lower extremity edema, non tender, no erythema  Gait normal with good balance and coordination.  MSK:  low back loss of lordosis. TTP in the paraspinal msk on right more then left, tight with FABER bilaterally.  Negative SLT  Patient moves slow from sitting to standing     Impression and Recommendations:     The above documentation has been reviewed and is accurate and complete [name], DO

## 2021-09-07 ENCOUNTER — Ambulatory Visit: Payer: Federal, State, Local not specified - PPO | Admitting: Family Medicine

## 2021-09-07 ENCOUNTER — Other Ambulatory Visit: Payer: Self-pay

## 2021-09-07 DIAGNOSIS — R1031 Right lower quadrant pain: Secondary | ICD-10-CM | POA: Diagnosis not present

## 2021-09-07 DIAGNOSIS — M5441 Lumbago with sciatica, right side: Secondary | ICD-10-CM | POA: Diagnosis not present

## 2021-09-07 DIAGNOSIS — E559 Vitamin D deficiency, unspecified: Secondary | ICD-10-CM | POA: Diagnosis not present

## 2021-09-07 DIAGNOSIS — M5442 Lumbago with sciatica, left side: Secondary | ICD-10-CM

## 2021-09-07 DIAGNOSIS — G8929 Other chronic pain: Secondary | ICD-10-CM | POA: Insufficient documentation

## 2021-09-07 MED ORDER — GABAPENTIN 300 MG PO CAPS: 300.0000 mg | ORAL_CAPSULE | Freq: Every day | ORAL | 3 refills | Status: DC

## 2021-09-07 NOTE — Assessment & Plan Note (Signed)
Chronic issue.  Did see gastroenterology.  Did not feel that he had been evaluated thoroughly.  I will refer patient to another gastroenterologist for further evaluation.

## 2021-09-07 NOTE — Assessment & Plan Note (Signed)
Encourage continued supplementation.  Recheck in 2 months.  If still low consider prescription dose.

## 2021-09-07 NOTE — Patient Instructions (Addendum)
Diet higher in Omega 3 and 6 Try Cesein protein avoidance  Continue the vitamin D and we will recheck in another 2 months  We will refer you for a second opinion on the stomach issues.  Gabapentin 300mg  at night  Stay active where you can  See me again in 8 weeks

## 2021-09-07 NOTE — Assessment & Plan Note (Signed)
Continues to have low back pain.  I do think that this is more secondary to what we have found on the MRI showing the patient does have spinal stenosis and some degenerative changes.  Increase gabapentin for this chronic problem with exacerbation.  Has responded somewhat to the epidurals previously.  Encourage patient continue the home exercises and icing regimen.  Follow-up with me again in 2 months

## 2021-09-26 ENCOUNTER — Telehealth: Payer: Self-pay | Admitting: Family Medicine

## 2021-09-26 NOTE — Telephone Encounter (Signed)
Pt states that at last visit Dr. Tamala Julian was going to refer patient for a 2nd opinion to GI ( possible [facility] ).  Pt called to advise he has not heard from anyone, I did not see this referral.

## 2021-09-27 ENCOUNTER — Other Ambulatory Visit: Payer: Self-pay

## 2021-09-27 DIAGNOSIS — G8929 Other chronic pain: Secondary | ICD-10-CM

## 2021-09-27 NOTE — Telephone Encounter (Signed)
Referral placed. Patient notified. 

## 2021-10-02 ENCOUNTER — Other Ambulatory Visit: Payer: Self-pay

## 2021-10-02 ENCOUNTER — Telehealth: Payer: Self-pay | Admitting: Family Medicine

## 2021-10-02 DIAGNOSIS — G8929 Other chronic pain: Secondary | ICD-10-CM

## 2021-10-02 NOTE — Telephone Encounter (Signed)
I guess call patient and tell him they declined and otherwise would need to go to [facility] or [facility].

## 2021-10-02 NOTE — Telephone Encounter (Signed)
Spoke w patient and he would like to see provider at [facility].

## 2021-10-02 NOTE — Telephone Encounter (Signed)
Reuben Likes from [facility] called, they only haVe 1 provider that will do second opinions. After reviewing notes, said provider declines seeing this patient for a second opinion.

## 2021-10-24 NOTE — Telephone Encounter (Signed)
Referral has been placed. 

## 2021-11-01 NOTE — Progress Notes (Signed)
[name]: [phone] Subjective:   Fontaine No, am serving as a scribe for Dr. Hulan Saas. This visit occurred during the SARS-CoV-2 public health emergency.  Safety protocols were in place, including screening questions prior to the visit, additional usage of staff PPE, and extensive cleaning of exam room while observing appropriate contact time as indicated for disinfecting solutions.  I'm seeing this patient by the request  of:  [name], MD  CC: Low back pain  [id number]  09/07/2021 Continues to have low back pain.  I do think that this is more secondary to what we have found on the MRI showing the patient does have spinal stenosis and some degenerative changes.  Increase gabapentin for this chronic problem with exacerbation.  Has responded somewhat to the epidurals previously.  Encourage patient continue the home exercises and icing regimen.  Follow-up with me again in 2 months  Chronic issue.  Did see gastroenterology.  Did not feel that he had been evaluated thoroughly.  I will refer patient to another gastroenterologist for further evaluation.  Encourage continued supplementation.  Recheck in 2 months.  If still low consider prescription dose.  Updated 11/02/2021 Michael [name] is a 66 y.o. male coming in with complaint of LBP. States that he is having pain superior to R PSIS. Using gabapentin which does help pain at night as he is most uncomfortable while lying in bed.  Patient has had epidurals in the past that has helped previously but does feel that most of his pain is still secondary to his stomach.  Still having difficulties seeing a provider for this.  States that he is scheduled for March to get a another opinion.       No past medical history on file. Past Surgical History:  Procedure Laterality Date   LUMBAR [place] SURGERY  2014   right ankle fracture  1978   external  pinning   Social History   Socioeconomic History   Marital status: Single    Spouse name: Not on file   Number of children: Not on file   Years of education: Not on file   Highest education level: Not on file  Occupational History   Not on file  Tobacco Use   Smoking status: Never   Smokeless tobacco: Never  Vaping Use   Vaping Use: Never used  Substance and Sexual Activity   Alcohol use: Yes    Comment: rare   Drug use: No   Sexual activity: Yes  Other Topics Concern   Not on file  Social History Narrative   Not on file   Social Determinants of Health   Financial Resource Strain: Not on file  Food Insecurity: Not on file  Transportation Needs: Not on file  Physical Activity: Not on file  Stress: Not on file  Social Connections: Not on file   No Known Allergies Family History  Problem Relation Age of Onset   High blood pressure Mother          Current Outpatient Medications (Other):    Blood Pressure Monitoring (ADULT BLOOD PRESSURE CUFF LG) KIT, 1 Device by Does not apply route daily.   gabapentin (NEURONTIN) 300 MG capsule, Take 1 capsule (300 mg total) by mouth at bedtime.   Reviewed prior external information including notes and imaging from  primary care provider As well as notes that were available from care everywhere and other healthcare systems.  Past medical history, social, surgical and family history all reviewed in electronic medical record.  No pertanent information unless stated regarding to the chief complaint.   Review of Systems:  No headache, visual changes, nausea, vomiting, diarrhea, constipation, dizziness, abdominal pain, skin rash, fevers, chills, night sweats, weight loss, swollen lymph nodes, body aches, joint swelling, chest pain, shortness of breath, mood changes. POSITIVE muscle aches  Objective  Blood pressure (!) 142/98, pulse (!) 49, height 6' 3"  (1.905 m), weight 242 lb (109.8 kg), SpO2 96 %.   General: No apparent  distress alert and oriented x3 mood and affect normal, dressed appropriately.  HEENT: Pupils equal, extraocular movements intact  Respiratory: Patient's speak in full sentences and does not appear short of breath  Cardiovascular: No lower extremity edema, non tender, no erythema  Gait antalgic Low back exam does have mild tenderness to palpation over the sacroiliac joint than usual.  Patient also has pain now in the paraspinal musculature of the lumbar spine.  After verbal consent patient was prepped with alcohol swab and with a 21-gauge 2 inch needle injected into the right sacroiliac region with a total of 1 cc of 0.5% Marcaine and 1 cc of Kenalog 40 mg/mL.  No blood loss.  Postinjection instructions given after Band-Aid placed.   Impression and Recommendations:    The above documentation has been reviewed and is accurate and complete [name], DO

## 2021-11-02 ENCOUNTER — Ambulatory Visit: Payer: Federal, State, Local not specified - PPO | Admitting: Family Medicine

## 2021-11-02 ENCOUNTER — Other Ambulatory Visit: Payer: Self-pay

## 2021-11-02 ENCOUNTER — Encounter: Payer: Self-pay | Admitting: Family Medicine

## 2021-11-02 DIAGNOSIS — M533 Sacrococcygeal disorders, not elsewhere classified: Secondary | ICD-10-CM

## 2021-11-02 DIAGNOSIS — M5441 Lumbago with sciatica, right side: Secondary | ICD-10-CM | POA: Diagnosis not present

## 2021-11-02 DIAGNOSIS — G8929 Other chronic pain: Secondary | ICD-10-CM

## 2021-11-02 DIAGNOSIS — M5442 Lumbago with sciatica, left side: Secondary | ICD-10-CM

## 2021-11-02 NOTE — Patient Instructions (Addendum)
[facility]: [id number] Injection today in SI joint.  Still think stomahc is playing a role Write me in 10-14 days to tell me if the injection help  If it doe NOT help we need to consider another epidural  Teaspoon of honey before bed could help with the cough if only at night See me again in 2-3 months

## 2021-11-02 NOTE — Assessment & Plan Note (Signed)
Attempted a sacroiliac injection today.  Tolerated the procedure well.  He did not have any significant improvement immediately after.  Patient will continue the same medications including the gabapentin.  Discussed with patient if he continues to have difficulty I would like to consider the possibility of another epidural.  Patient as well as myself does feel that the gastroenterology problems are likely still contributing as well.  Patient is seen another provider for this in the relatively near future.  Patient will follow-up with me again in 4 to 6 weeks otherwise.

## 2021-11-23 ENCOUNTER — Telehealth: Payer: Self-pay | Admitting: Family Medicine

## 2021-11-23 ENCOUNTER — Other Ambulatory Visit: Payer: Self-pay

## 2021-11-23 DIAGNOSIS — G8929 Other chronic pain: Secondary | ICD-10-CM

## 2021-11-23 DIAGNOSIS — R1031 Right lower quadrant pain: Secondary | ICD-10-CM

## 2021-11-23 NOTE — Telephone Encounter (Signed)
Pt calling about GI referral again based on MyChart msg he sent earlier this month. Apparently [facility] and [facility] declined, he will go anywhere in [place] except [facility].

## 2021-11-23 NOTE — Telephone Encounter (Signed)
Per a verbal from Dr. Tamala Julian patient referred to GI at [facility].

## 2021-11-23 NOTE — Telephone Encounter (Signed)
Try [facility] if they have GI?

## 2021-12-02 ENCOUNTER — Encounter: Payer: Self-pay | Admitting: Family Medicine

## 2021-12-04 ENCOUNTER — Telehealth: Payer: Self-pay | Admitting: Family Medicine

## 2021-12-04 NOTE — Telephone Encounter (Signed)
Patient calling in with respiratory symptoms: Shortness of breath, chest pain, palpitations or other red words send to Triage  Does the patient have a fever over 100, cough, congestion, sore throat, runny nose, lost of taste/smell (please list symptoms that patient has)?runny nose, congestion, cough, slight headache  What date did symptoms start?02/02 (If over 5 days ago, pt may be scheduled for in person visit)  Have you tested for Covid in the last 5 days? No   If yes, was it positive OR negative ? If positive in the last 5 days, please schedule virtual visit now. If negative, schedule for an in person OV with the next available provider if PCP has no openings. Please also let patient know they will be tested again (follow the script below)  "you will have to arrive 31mins prior to your appt time to be Covid tested. Please park in back of office at the cone & call [phone] to let the staff know you have arrived. A staff member will meet you at your car to do a rapid covid test. Once the test has resulted you will be notified by phone of your results to determine if appt will remain an in person visit or be converted to a virtual/phone visit. If you arrive less than 21mins before your appt time, your visit will be automatically converted to virtual & any recommended testing will happen AFTER the visit."   [place]  If no availability for virtual visit in office,  please schedule another [place] office  If no availability at another [place] office, please instruct patient that they can schedule an evisit or virtual visit through their mychart account. Visits up to 8pm  patients can be seen in office 5 days after positive COVID test

## 2021-12-05 ENCOUNTER — Other Ambulatory Visit: Payer: Self-pay | Admitting: Family Medicine

## 2021-12-05 ENCOUNTER — Ambulatory Visit: Payer: Federal, State, Local not specified - PPO | Admitting: Family Medicine

## 2021-12-05 VITALS — BP 140/70 | HR 35 | Temp 98.4°F | Wt 234.2 lb

## 2021-12-05 DIAGNOSIS — I498 Other specified cardiac arrhythmias: Secondary | ICD-10-CM | POA: Diagnosis not present

## 2021-12-05 DIAGNOSIS — R059 Cough, unspecified: Secondary | ICD-10-CM | POA: Diagnosis not present

## 2021-12-05 DIAGNOSIS — R001 Bradycardia, unspecified: Secondary | ICD-10-CM | POA: Diagnosis not present

## 2021-12-05 DIAGNOSIS — G8929 Other chronic pain: Secondary | ICD-10-CM

## 2021-12-05 MED ORDER — PREDNISONE 20 MG PO TABS: ORAL_TABLET | ORAL | 0 refills | Status: DC

## 2021-12-05 NOTE — Telephone Encounter (Signed)
Lumbar MRI has been ordered

## 2021-12-05 NOTE — Progress Notes (Signed)
Established Patient Office Visit  Subjective:  Patient ID: Michael [name], male    DOB: 1956-01-30  Age: 66 y.o. MRN: [id number]  CC:  Chief Complaint  Patient presents with   URI    Chest congestion, deep productive cough, x 4 days    HPI Michael [name] presents for 4 days hx of upper respiratory symptoms of cough and nasal congestion.  No fever.  Dull headache.     No Covid testing done.   No N/V/D.  No sore throat.   He feels he has some mild intermittent wheezing.   Family members with similar symptoms.      No recent chest pain, dyspnea, syncope.   Non-smoker.    No regular alcohol.  On Gabapentin for chronic low back pain.  No recent appetite or weight changes.      No past medical history on file.  Past Surgical History:  Procedure Laterality Date   LUMBAR [place] SURGERY  2014   right ankle fracture  1978   external pinning    Family History  Problem Relation Age of Onset   High blood pressure Mother     Social History   Socioeconomic History   Marital status: Single    Spouse name: Not on file   Number of children: Not on file   Years of education: Not on file   Highest education level: Some college, no degree  Occupational History   Not on file  Tobacco Use   Smoking status: Never   Smokeless tobacco: Never  Vaping Use   Vaping Use: Never used  Substance and Sexual Activity   Alcohol use: Yes    Comment: rare   Drug use: No   Sexual activity: Yes  Other Topics Concern   Not on file  Social History Narrative   Not on file   Social Determinants of Health   Financial Resource Strain: Medium Risk   Difficulty of Paying Living Expenses: Somewhat hard  Food Insecurity: Food Insecurity Present   Worried About Running Out of Food in the Last Year: Sometimes true   Ran Out of Food in the Last Year: Sometimes true  Transportation Needs: No Transportation Needs   Lack of Transportation (Medical): No   Lack of Transportation (Non-Medical): No   Physical Activity: Insufficiently Active   Days of Exercise per Week: 2 days   Minutes of Exercise per Session: 30 min  Stress: Stress Concern Present   Feeling of Stress : To some extent  Social Connections: Moderately Isolated   Frequency of Communication with Friends and Family: Twice a week   Frequency of Social Gatherings with Friends and Family: Once a week   Attends Religious Services: 1 to 4 times per year   Active Member of [employer] or Organizations: No   Attends Music therapist: Not on file   Marital Status: Divorced  Human resources officer Violence: Not on file    Outpatient Medications Prior to Visit  Medication Sig Dispense Refill   Blood Pressure Monitoring (ADULT BLOOD PRESSURE CUFF LG) KIT 1 Device by Does not apply route daily. 1 each 0   gabapentin (NEURONTIN) 300 MG capsule Take 1 capsule (300 mg total) by mouth at bedtime. 90 capsule 3   No facility-administered medications prior to visit.    No Known Allergies  ROS Review of Systems  Constitutional:  Positive for fatigue. Negative for fever.  HENT:  Negative for sore throat.   Respiratory:  Positive for cough. Negative  for shortness of breath.   Cardiovascular:  Negative for chest pain, palpitations and leg swelling.  Gastrointestinal:  Negative for abdominal pain, diarrhea, nausea and vomiting.     Objective:    Physical Exam Vitals reviewed.  Constitutional:      Appearance: Normal appearance.  HENT:     Right Ear: Tympanic membrane normal.     Left Ear: Tympanic membrane normal.  Cardiovascular:     Comments: Bradycardia- palpated at 35 beats/minute.   Pulmonary:     Effort: Pulmonary effort is normal.     Breath sounds: Normal breath sounds. No wheezing or rales.  Musculoskeletal:     Cervical back: Neck supple.     Right lower leg: No edema.     Left lower leg: No edema.  Lymphadenopathy:     Cervical: No cervical adenopathy.  Neurological:     Mental Status: He is alert.     BP 140/70 (BP Location: Left Arm, Patient Position: Sitting, Cuff Size: Normal)    Pulse (!) 35    Temp 98.4 F (36.9 C) (Oral)    Wt 234 lb 3.2 oz (106.2 kg)    SpO2 98%    BMI 29.27 kg/m  Wt Readings from Last 3 Encounters:  12/05/21 234 lb 3.2 oz (106.2 kg)  11/02/21 242 lb (109.8 kg)  09/07/21 239 lb (108.4 kg)     Health Maintenance Due  Topic Date Due   TETANUS/TDAP  Never done   Zoster Vaccines- Shingrix (1 of 2) Never done   COVID-19 Vaccine (4 - Booster for Pfizer series) 08/24/2020   INFLUENZA VACCINE  Never done    There are no preventive care reminders to display for this patient.  No results found for: TSH Lab Results  Component Value Date   WBC 5.7 07/20/2021   HGB 14.2 07/20/2021   HCT 43.2 07/20/2021   MCV 68.6 Repeated and verified X2. (L) 07/20/2021   PLT 154.0 07/20/2021   Lab Results  Component Value Date   NA 140 07/20/2021   K 3.9 07/20/2021   CO2 26 07/20/2021   GLUCOSE 91 07/20/2021   BUN 20 07/20/2021   CREATININE 0.82 07/20/2021   BILITOT 0.8 07/20/2021   ALKPHOS 77 07/20/2021   AST 16 07/20/2021   ALT 11 07/20/2021   PROT 7.0 07/20/2021   ALBUMIN 3.9 07/20/2021   CALCIUM 9.0 07/20/2021   GFR 93.43 07/20/2021   Lab Results  Component Value Date   CHOL 164 02/01/2021   Lab Results  Component Value Date   HDL 49.60 02/01/2021   Lab Results  Component Value Date   LDLCALC 106 (H) 02/01/2021   Lab Results  Component Value Date   TRIG 39.0 02/01/2021   Lab Results  Component Value Date   CHOLHDL 3 02/01/2021   No results found for: HGBA1C    Assessment & Plan:   #1  cough.   Suspect viral URI with cough.  No respiratory distress.   ?Intermittent mild wheezing.    -follow up promptly for fever or increased shortness of breath -prednisone 20 mg take two tablets once daily for 5 days  #2 bradycardia.   Palpated pulse 35.    EKG shows ventricular bigeminy with rate of around 70.   No acute ST-T changes.   No old EKG for  comparison.  He appears to be asymptomatic at this time.   No hx of prior cardiac evaluation      given age, recommend cardiology referral.  Pt  agrees.     Meds ordered this encounter  Medications   predniSONE (DELTASONE) 20 MG tablet    Sig: Take two tablets by mouth once daily for 5 days    Dispense:  10 tablet    Refill:  0    Follow-up: No follow-ups on file.    Carolann Littler, MD

## 2021-12-12 ENCOUNTER — Other Ambulatory Visit: Payer: Self-pay

## 2021-12-12 ENCOUNTER — Encounter: Payer: Self-pay | Admitting: Family Medicine

## 2021-12-12 ENCOUNTER — Telehealth (INDEPENDENT_AMBULATORY_CARE_PROVIDER_SITE_OTHER): Payer: Federal, State, Local not specified - PPO | Admitting: Family Medicine

## 2021-12-12 DIAGNOSIS — R059 Cough, unspecified: Secondary | ICD-10-CM

## 2021-12-12 MED ORDER — BENZONATATE 100 MG PO CAPS: ORAL_CAPSULE | ORAL | 0 refills | Status: DC

## 2021-12-12 NOTE — Patient Instructions (Signed)
°  HOME CARE TIPS:  -I sent the medication(s) we discussed to your pharmacy: Meds ordered this encounter  Medications   benzonatate (TESSALON PERLES) 100 MG capsule    Sig: 1-2 capsules up to twice daily as needed for cough    Dispense:  20 capsule    Refill:  0      -can use nasal saline a few times per day if you have nasal congestion  -stay hydrated, drink plenty of fluids and eat small healthy meals - avoid dairy  -can take 1000 IU (26mcg) Vit D3 and 100-500 mg of Vit C daily per instructions  -If the Covid test is positive, check out the CDC website for more information on home care, transmission and treatment for COVID19  -follow up with your doctor in 2-3 days unless improving and feeling better  -stay home while sick, except to seek medical care. If you have COVID19, you will likely be contagious for 7-10 days. Flu or Influenza is likely contagious for about 7 days. Other respiratory viral infections remain contagious for 5-10+ days depending on the virus and many other factors. Wear a good mask that fits snugly (such as N95 or KN95) if around others to reduce the risk of transmission.  It was nice to meet you today, and I really hope you are feeling better soon. I help [facility] out with telemedicine visits on Tuesdays and Thursdays and am happy to help if you need a follow up virtual visit on those days. Otherwise, if you have any concerns or questions following this visit please schedule a follow up visit with your Primary Care doctor or seek care at a local urgent care clinic to avoid delays in care.    Seek in person care or schedule a follow up video visit promptly if your symptoms worsen, new concerns arise or you are not improving with treatment. Call 911 and/or seek emergency care if your symptoms are severe or life threatening.

## 2021-12-12 NOTE — Progress Notes (Signed)
Virtual Visit via Video Note  I connected with Michael [name]  on 12/12/21 at  1:20 PM EST by a video enabled telemedicine application and verified that I am speaking with the correct person using two identifiers.  Location patient: [place] Location provider:work or home office Persons participating in the virtual visit: patient, provider  I discussed the limitations and requested verbal permission for telemedicine visit. The patient expressed understanding and agreed to proceed.   HPI:  Acute telemedicine visit for cough and congestion: -Onset:about 7-10 days -exposed to covid prior -Symptoms include:cough, pnd, raspy voice, had a runny nose and some chills initially -now improving - occasional cough and a bit of a raspy voice at times -now cough is improving -Denies:fevers, CP, SOB, NVD -Pertinent past medical history: see below -Pertinent medication allergies:No Known Allergies -COVID-19 vaccine status:  Immunization History  Administered Date(s) Administered   PFIZER(Purple Top)SARS-COV-2 Vaccination 02/02/2020, 02/23/2020, 06/29/2020     ROS: See pertinent positives and negatives per HPI.  History reviewed. No pertinent past medical history.  Past Surgical History:  Procedure Laterality Date   LUMBAR [place] SURGERY  2014   right ankle fracture  1978   external pinning     Current Outpatient Medications:    benzonatate (TESSALON PERLES) 100 MG capsule, 1-2 capsules up to twice daily as needed for cough, Disp: 20 capsule, Rfl: 0   Blood Pressure Monitoring (ADULT BLOOD PRESSURE CUFF LG) KIT, 1 Device by Does not apply route daily., Disp: 1 each, Rfl: 0   gabapentin (NEURONTIN) 300 MG capsule, Take 1 capsule (300 mg total) by mouth at bedtime., Disp: 90 capsule, Rfl: 3   GARLIC PO, Take by mouth daily., Disp: , Rfl:   EXAM:  VITALS per patient if applicable:  GENERAL: alert, oriented, appears well and in no acute distress  HEENT: atraumatic, conjunttiva clear, no obvious  abnormalities on inspection of external nose and ears  NECK: normal movements of the head and neck  LUNGS: on inspection no signs of respiratory distress, breathing rate appears normal, no obvious gross SOB, gasping or wheezing  CV: no obvious cyanosis  MS: moves all visible extremities without noticeable abnormality  PSYCH/NEURO: pleasant and cooperative, no obvious depression or anxiety, speech and thought processing grossly intact  ASSESSMENT AND PLAN:  Discussed the following assessment and plan:  Cough, unspecified type  -we discussed possible serious and likely etiologies, options for evaluation and workup, limitations of telemedicine visit vs in person visit, treatment, treatment risks and precautions. Pt is agreeable to treatment via telemedicine at this moment. Suspect resolving covid infection vs other. Discussed contagious period and potential complications, precautions. He reports he is feeling a lot better. Requested a medication for cough - rx sent.  Advise to seek prompt virtual visit or in person care if worsening, new symptoms arise, or if is not improving with treatment as expected per our conversation of expected course.  I discussed the assessment and treatment plan with the patient. The patient was provided an opportunity to ask questions and all were answered. The patient agreed with the plan and demonstrated an understanding of the instructions.     Lucretia Kern, DO

## 2021-12-29 ENCOUNTER — Ambulatory Visit
Admission: RE | Admit: 2021-12-29 | Discharge: 2021-12-29 | Disposition: A | Discharge status: Home / Self Care | Payer: Federal, State, Local not specified - PPO | Source: Ambulatory Visit | Attending: Family Medicine | Admitting: Family Medicine

## 2021-12-29 ENCOUNTER — Other Ambulatory Visit: Payer: Self-pay

## 2021-12-29 DIAGNOSIS — G8929 Other chronic pain: Secondary | ICD-10-CM

## 2021-12-29 DIAGNOSIS — M545 Low back pain, unspecified: Secondary | ICD-10-CM | POA: Diagnosis not present

## 2022-01-01 ENCOUNTER — Encounter: Payer: Self-pay | Admitting: Family Medicine

## 2022-01-05 ENCOUNTER — Ambulatory Visit (INDEPENDENT_AMBULATORY_CARE_PROVIDER_SITE_OTHER): Payer: Federal, State, Local not specified - PPO | Admitting: Cardiovascular Disease

## 2022-01-05 ENCOUNTER — Other Ambulatory Visit: Payer: Self-pay

## 2022-01-05 ENCOUNTER — Encounter: Payer: Self-pay | Admitting: Cardiovascular Disease

## 2022-01-05 ENCOUNTER — Ambulatory Visit (INDEPENDENT_AMBULATORY_CARE_PROVIDER_SITE_OTHER): Payer: Federal, State, Local not specified - PPO

## 2022-01-05 VITALS — BP 136/70 | HR 40 | Ht 75.0 in | Wt 237.0 lb

## 2022-01-05 DIAGNOSIS — I493 Ventricular premature depolarization: Secondary | ICD-10-CM

## 2022-01-05 DIAGNOSIS — I498 Other specified cardiac arrhythmias: Secondary | ICD-10-CM

## 2022-01-05 DIAGNOSIS — R0609 Other forms of dyspnea: Secondary | ICD-10-CM

## 2022-01-05 NOTE — Patient Instructions (Signed)
Medication Instructions:  ?Your physician recommends that you continue on your current medications as directed. Please refer to the Current Medication list given to you today. ? ?*If you need a refill on your cardiac medications before your next appointment, please call your pharmacy* ? ? ?Lab Work: ?TODAY: TSH, BMP ?If you have labs (blood work) drawn today and your tests are completely normal, you will receive your results only by: ?MyChart Message (if you have MyChart) OR ?A paper copy in the mail ?If you have any lab test that is abnormal or we need to change your treatment, we will call you to review the results. ? ? ?Testing/Procedures: ? ?Your physician has recommended for you to wear a ZIO heart monitor. ? ?Your physician has requested that you have an echocardiogram. Echocardiography is a painless test that uses sound waves to create images of your heart. It provides your doctor with information about the size and shape of your heart and how well your heart?s chambers and valves are working. This procedure takes approximately one hour. There are no restrictions for this procedure. ? ?Follow-Up: ?At [facility], you and your health needs are our priority.  As part of our continuing mission to provide you with exceptional heart care, we have created designated Provider Care Teams.  These Care Teams include your primary Cardiologist (physician) and Advanced Practice Providers (APPs -  Physician Assistants and Nurse Practitioners) who all work together to provide you with the care you need, when you need it. ? ?Your next appointment:   ?2 month(s) ? ?The format for your next appointment:   ?In Person ? ?Provider:   ?Mertie Moores, MD ?

## 2022-01-05 NOTE — Progress Notes (Signed)
?Cardiology Office Note:   ? ?Date:  01/05/2022  ? ?ID:  Michael [name], DOB [birth year], MRN [id number] ? ?PCP:  [name], MD ?  ?[facility] Providers ?Cardiologist:  Atley Neubert  ? ? ?Referring MD: Eulas Post, MD  ? ?Chief Complaint  ?Patient presents with  ? Palpitations  ? ? ?History of Present Illness:   ? ?Michael [name] is a 66 y.o. male with a hx of ventricular bigeminy  ? ?No CP , no dyspnea. ?Exercises regularly  ?Bikes, walks regularly  ?Has some spinal issues that keeps him from exercising as much as he would want . ? ?Had a physical ,  HR was 40 ?ECG  showed ventricular bigeminy  ? ?Has had some weight gain ,  ( due to lack of exercise )  ?Doesn't work any long  ?Previously worked at the [employer]  ? ?History reviewed. No pertinent past medical history. ? ?Past Surgical History:  ?Procedure Laterality Date  ? [place] SURGERY  2014  ? right ankle fracture  1978  ? external pinning  ? ? ?Current Medications: ?Current Meds  ?Medication Sig  ? benzonatate (TESSALON PERLES) 100 MG capsule 1-2 capsules up to twice daily as needed for cough  ? Blood Pressure Monitoring (ADULT BLOOD PRESSURE CUFF LG) KIT 1 Device by Does not apply route daily.  ? gabapentin (NEURONTIN) 300 MG capsule Take 1 capsule (300 mg total) by mouth at bedtime.  ? GARLIC PO Take by mouth daily.  ?  ? ?Allergies:   Patient has no known allergies.  ? ?Social History  ? ?Socioeconomic History  ? Marital status: Single  ?  Spouse name: Not on file  ? Number of children: Not on file  ? Years of education: Not on file  ? Highest education level: Some college, no degree  ?Occupational History  ? Not on file  ?Tobacco Use  ? Smoking status: Never  ? Smokeless tobacco: Never  ?Vaping Use  ? Vaping Use: Never used  ?Substance and Sexual Activity  ? Alcohol use: Yes  ?  Comment: rare  ? Drug use: No  ? Sexual activity: Yes  ?Other Topics Concern  ? Not on file  ?Social History Narrative  ? Not on file  ? ?Social Determinants of  Health  ? ?Financial Resource Strain: Medium Risk  ? Difficulty of Paying Living Expenses: Somewhat hard  ?Food Insecurity: Food Insecurity Present  ? Worried About Charity fundraiser in the Last Year: Sometimes true  ? Ran Out of Food in the Last Year: Sometimes true  ?Transportation Needs: No Transportation Needs  ? Lack of Transportation (Medical): No  ? Lack of Transportation (Non-Medical): No  ?Physical Activity: Insufficiently Active  ? Days of Exercise per Week: 2 days  ? Minutes of Exercise per Session: 30 min  ?Stress: Stress Concern Present  ? Feeling of Stress : To some extent  ?Social Connections: Moderately Isolated  ? Frequency of Communication with Friends and Family: Twice a week  ? Frequency of Social Gatherings with Friends and Family: Once a week  ? Attends Religious Services: 1 to 4 times per year  ? Active Member of Clubs or Organizations: No  ? Attends Archivist Meetings: Not on file  ? Marital Status: Divorced  ?  ? ?Family History: ?The patient's family history includes High blood pressure in his mother. ? ?ROS:   ?Please see the history of present illness.    ? All other systems  reviewed and are negative. ? ?EKGs/Labs/Other Studies Reviewed:   ? ?The following studies were reviewed today: ? ? ?EKG:   12/05/21:   NSR with ventricular bigeminy ? ?Recent Labs: ?07/20/2021: ALT 11; BUN 20; Creatinine, Ser 0.82; Hemoglobin 14.2; Platelets 154.0; Potassium 3.9; Sodium 140  ?Recent Lipid Panel ?   ?Component Value Date/Time  ? CHOL 164 02/01/2021 1000  ? TRIG 39.0 02/01/2021 1000  ? HDL 49.60 02/01/2021 1000  ? CHOLHDL 3 02/01/2021 1000  ? VLDL 7.8 02/01/2021 1000  ? LDLCALC 106 (H) 02/01/2021 1000  ? ? ? ?Risk Assessment/Calculations:   ?  ? ?    ? ?Physical Exam:   ? ?VS:  BP 136/70   Pulse (!) 40   Ht 6' 3"  (1.905 m)   Wt 237 lb (107.5 kg)   SpO2 98%   BMI 29.62 kg/m?    ? ?Wt Readings from Last 3 Encounters:  ?01/05/22 237 lb (107.5 kg)  ?12/05/21 234 lb 3.2 oz (106.2 kg)   ?11/02/21 242 lb (109.8 kg)  ?  ? ?GEN:  Well nourished, well developed in no acute distress ?HEENT: Normal ?NECK: No JVD; No carotid bruits ?LYMPHATICS: No lymphadenopathy ?CARDIAC: RRR frequent premature beats consistent with ventricular bigeminy. ?RESPIRATORY:  Clear to auscultation without rales, wheezing or rhonchi  ?ABDOMEN: Soft, non-tender, non-distended ?MUSCULOSKELETAL:  No edema; No deformity  ?SKIN: Warm and dry ?NEUROLOGIC:  Alert and oriented x 3 ?PSYCHIATRIC:  Normal affect  ? ?After 1 minute step test his ventricular bigeminy resolved.  He was in sinus tachycardia at a rate of 110. ? ?ASSESSMENT:   ? ?1. DOE (dyspnea on exertion)   ?2. Frequent PVCs   ?3. Ventricular bigeminy   ? ?PLAN:   ? ?In order of problems listed above: ? ?Frequent premature ventricular contractions: Nathaneil was in ventricular bigeminy when he presented to the office.  That is consistent with his EKG from last week. ? ?We performed a 1 minute step test and his PVCs resolved.  He was able to achieve a sinus tachycardia at the rate of 110-115. ? ?We will place a Zio patch monitor for 3 days to obtain a PVC burden.  If he has significant PVC burden will ask electrophysiology to help [place] with an antiarrhythmic choice.  I would also like to get an echocardiogram to assess his LV function. ? ?   ? ?   ? ? ?Medication Adjustments/Labs and Tests Ordered: ?Current medicines are reviewed at length with the patient today.  Concerns regarding medicines are outlined above.  ?Orders Placed This Encounter  ?Procedures  ? TSH  ? Basic metabolic panel  ? LONG TERM MONITOR (3-14 DAYS)  ? ECHOCARDIOGRAM COMPLETE  ? ?No orders of the defined types were placed in this encounter. ? ? ?Patient Instructions  ?Medication Instructions:  ?Your physician recommends that you continue on your current medications as directed. Please refer to the Current Medication list given to you today. ? ?*If you need a refill on your cardiac medications before your next  appointment, please call your pharmacy* ? ? ?Lab Work: ?TODAY: TSH, BMP ?If you have labs (blood work) drawn today and your tests are completely normal, you will receive your results only by: ?MyChart Message (if you have MyChart) OR ?A paper copy in the mail ?If you have any lab test that is abnormal or we need to change your treatment, we will call you to review the results. ? ? ?Testing/Procedures: ? ?Your physician has recommended for you  to wear a ZIO heart monitor. ? ?Your physician has requested that you have an echocardiogram. Echocardiography is a painless test that uses sound waves to create images of your heart. It provides your doctor with information about the size and shape of your heart and how well your heart?s chambers and valves are working. This procedure takes approximately one hour. There are no restrictions for this procedure. ? ?Follow-Up: ?At [facility], you and your health needs are our priority.  As part of our continuing mission to provide you with exceptional heart care, we have created designated Provider Care Teams.  These Care Teams include your primary Cardiologist (physician) and Advanced Practice Providers (APPs -  Physician Assistants and Nurse Practitioners) who all work together to provide you with the care you need, when you need it. ? ?Your next appointment:   ?2 month(s) ? ?The format for your next appointment:   ?In Person ? ?Provider:   ?Mertie Moores, MD  ? ?Signed, ?Mertie Moores, MD  ?01/05/2022 5:49 PM    ?[place]  ?

## 2022-01-05 NOTE — Progress Notes (Unsigned)
[id number] 3 day zio xt from office inventory applied to patient for PVC burden. ?

## 2022-01-06 LAB — BASIC METABOLIC PANEL
BUN/Creatinine Ratio: 19 (ref 10–24)
BUN: 16 mg/dL (ref 8–27)
CO2: 21 mmol/L (ref 20–29)
Calcium: 9.3 mg/dL (ref 8.6–10.2)
Chloride: 107 mmol/L — ABNORMAL HIGH (ref 96–106)
Creatinine, Ser: 0.83 mg/dL (ref 0.76–1.27)
Glucose: 82 mg/dL (ref 70–99)
Potassium: 4.4 mmol/L (ref 3.5–5.2)
Sodium: 146 mmol/L — ABNORMAL HIGH (ref 134–144)
eGFR: 98 mL/min/{1.73_m2} (ref >59)

## 2022-01-06 LAB — TSH: TSH: 4.28 u[IU]/mL (ref 0.450–4.500)

## 2022-01-10 ENCOUNTER — Other Ambulatory Visit: Payer: Self-pay

## 2022-01-10 DIAGNOSIS — G8929 Other chronic pain: Secondary | ICD-10-CM

## 2022-01-10 NOTE — Progress Notes (Signed)
? ? Michael [name] D.Merril Abbe ?Michael [name] ?[place] ?Phone: [phone] ?  ?Assessment and Plan:   ?  ?1. Acute pain of right knee ?2. Primary osteoarthritis of right knee ?-Chronic with exacerbation, initial sports [name] visit ?- Acute flare of right knee pain after fall onto the knee this weekend likely an exacerbation of chronic OA ?- X-ray obtained in clinic.  My interpretation: No acute fracture or dislocation.  Chronic appearing tricompartmental cortical changes, most significant in patellofemoral ?- Patient elected for CSI.  Tolerated well per note below ?- Continue Tylenol as needed for pain relief ? ?Procedure: Knee Joint Injection ?Side: Right ?Indication: Acute flare of right knee pain after fall ? ?Risks explained and consent was given verbally. The site was cleaned with alcohol prep. A needle was introduced with an anterio-lateral approach. Injection given using 28m of 1% lidocaine without epinephrine and 187mof kenalog 4016ml. This was well tolerated and resulted in symptomatic relief.  Needle was removed, hemostasis achieved, and post injection instructions were explained.   Pt was advised to call or return to clinic if these symptoms worsen or fail to improve as anticipated.  ?  ?Pertinent previous records reviewed include none ?  ?Follow Up: Follow-up scheduled with Michael [name] in place for 3 weeks from now.  Can discuss ongoing low back treatment as well as ensure that right knee has improved ?  ?Subjective:   ?I, Michael [name] serving as a scrEducation administratorr Doctor Michael [name]: right knee injury ? ?HPI:  ?01/11/2022 ?Patient is a 66 39ar old male complaining of right knee pain. Patient states that he was going down stairs Saturday morning  his heel slipped his right leg stayed stationary in a hyper flexed position didn't hear any pop, no numbness tingling doing down , numb achy pain all through the knee , has been  using lavender oil, been using an over the counter topical cream but doesn't remember the name, has been talking with dr smiTamala Julians question about an MRI thinks his low back could be affecting his hamstring and hip flexor his left leg is 60% and right leg is 30 % engaging the muscles to flex they feel frozen in the ground thinks that could be one of the reasons why he fell down the stairs  ? ?Relevant Historical Information: None pertinent ? ?Additional pertinent review of systems negative. ? ? ?Current Outpatient Medications:  ?  benzonatate (TESSALON PERLES) 100 MG capsule, 1-2 capsules up to twice daily as needed for cough, Disp: 20 capsule, Rfl: 0 ?  Blood Pressure Monitoring (ADULT BLOOD PRESSURE CUFF LG) KIT, 1 Device by Does not apply route daily., Disp: 1 each, Rfl: 0 ?  gabapentin (NEURONTIN) 300 MG capsule, Take 1 capsule (300 mg total) by mouth at bedtime., Disp: 90 capsule, Rfl: 3 ?  GARLIC PO, Take by mouth daily., Disp: , Rfl:   ? ?Objective:   ?  ?Vitals:  ? 01/11/22 1238  ?BP: 120/80  ?Pulse: (!) 45  ?SpO2: 98%  ?Weight: 237 lb (107.5 kg)  ?Height: 6' 3"  (1.905 m)  ?  ?  ?Body mass index is 29.62 kg/m?.  ?  ?Physical Exam:   ? ?General:  awake, alert oriented, no acute distress nontoxic ?Skin: no suspicious lesions or rashes ?Neuro:sensation intact, no deficits, strength 5/5 with no deficits, no atrophy, normal muscle tone ?Psych: No signs of anxiety, depression or other mood disorder ? ?Knee: ?Moderate  swelling ?No deformity ?Positive fluid wave, joint milking ?ROM Flex 100, Ext 10 ?TTP medial and lateral joint line ?NTTP over the quad tendon, medial fem condyle, lat fem condyle, patella, plica, patella tendon, tibial tuberostiy, fibular head, posterior fossa, pes anserine bursa, gerdy's tubercle,   ?Neg anterior and posterior drawer ?Neg lachman ?Neg sag sign ?Negative varus stress ?Negative valgus stress ?Negative McMurray ?  ? ?Gait slowed, favoring left leg, using 1 pronged  cane ? ? ?Electronically signed by:  ?Michael [name] D.Merril Abbe ?[place] [name] ?1:17 PM 01/11/22 ?

## 2022-01-11 ENCOUNTER — Ambulatory Visit: Payer: Federal, State, Local not specified - PPO | Admitting: Sports Medicine

## 2022-01-11 ENCOUNTER — Other Ambulatory Visit: Payer: Self-pay

## 2022-01-11 ENCOUNTER — Ambulatory Visit (INDEPENDENT_AMBULATORY_CARE_PROVIDER_SITE_OTHER): Payer: Federal, State, Local not specified - PPO

## 2022-01-11 VITALS — BP 120/80 | HR 45 | Ht 75.0 in | Wt 237.0 lb

## 2022-01-11 DIAGNOSIS — M1711 Unilateral primary osteoarthritis, right knee: Secondary | ICD-10-CM | POA: Diagnosis not present

## 2022-01-11 DIAGNOSIS — M25561 Pain in right knee: Secondary | ICD-10-CM | POA: Diagnosis not present

## 2022-01-11 NOTE — Patient Instructions (Addendum)
Good to see you  ?Knee HEP  ?Keep your follow up with Dr. Tamala Julian  ?

## 2022-01-17 ENCOUNTER — Telehealth: Payer: Self-pay | Admitting: Family Medicine

## 2022-01-17 NOTE — Telephone Encounter (Signed)
Patient called in asking if Dr.Burchette retrieved his medical records like they discussed from [place]. Patient stated that the provider is now deceased but the record would be in [facility] system so it should be in his chart. ? ?Patient is requesting a phone call back for any questions. ? ?Please advise. ?

## 2022-01-18 ENCOUNTER — Telehealth (HOSPITAL_COMMUNITY): Payer: Self-pay | Admitting: Cardiovascular Disease

## 2022-01-18 NOTE — Telephone Encounter (Signed)
Noted  

## 2022-01-18 NOTE — Telephone Encounter (Signed)
Patient called and cancelled echocardiogram for reason below: ? ?01/18/22 patient cancelled and test due to expenses and states he doesnt have a cardiac issue/LBW ? ?Order will be removed from the echo WQ.  ?

## 2022-01-23 ENCOUNTER — Other Ambulatory Visit (HOSPITAL_COMMUNITY): Payer: Federal, State, Local not specified - PPO

## 2022-01-31 ENCOUNTER — Ambulatory Visit
Admission: RE | Admit: 2022-01-31 | Discharge: 2022-01-31 | Disposition: A | Discharge status: Home / Self Care | Payer: Federal, State, Local not specified - PPO | Source: Ambulatory Visit | Attending: Family Medicine | Admitting: Family Medicine

## 2022-01-31 ENCOUNTER — Other Ambulatory Visit: Payer: Self-pay | Admitting: Family Medicine

## 2022-01-31 DIAGNOSIS — G8929 Other chronic pain: Secondary | ICD-10-CM

## 2022-01-31 DIAGNOSIS — R1013 Epigastric pain: Secondary | ICD-10-CM | POA: Diagnosis not present

## 2022-01-31 DIAGNOSIS — K5730 Diverticulosis of large intestine without perforation or abscess without bleeding: Secondary | ICD-10-CM | POA: Diagnosis not present

## 2022-01-31 NOTE — Progress Notes (Signed)
?Charlann Boxer D.O. ?[facility] ?[place] ?Phone: [phone] ?Subjective:   ?I, Michael [name], am serving as a scribe for Dr. Hulan Saas. ? ?This visit occurred during the SARS-CoV-2 public health emergency.  Safety protocols were in place, including screening questions prior to the visit, additional usage of staff PPE, and extensive cleaning of exam room while observing appropriate contact time as indicated for disinfecting solutions.  ?I'm seeing this patient by the request  of:  Koberlein, Steele Berg, MD ? ?CC: Low back pain follow-up ? ?[id number]  ?11/02/2021 ?Attempted a sacroiliac injection today.  Tolerated the procedure well.  He did not have any significant improvement immediately after.  Patient will continue the same medications including the gabapentin.  Discussed with patient if he continues to have difficulty I would like to consider the possibility of another epidural.  Patient as well as myself does feel that the gastroenterology problems are likely still contributing as well.  Patient is seen another provider for this in the relatively near future.  Patient will follow-up with me again in 4 to 6 weeks otherwise. ? ?Update 02/01/2022 ?Michael [name] is a 66 y.o. male coming in with complaint of SI Joint injection. Patient states that his pain is the same as last visit. Patient feels that something is not releasing in his back in order for him to make simple core movements. Patient feels like there is a lapse in the signal from the brain to his legs to get them to move after standing for a long period of time. Injection has wore off.  Patient did have a CT scan done of the abdomen and pelvis.  Patient was found to have some diverticulosis but otherwise fairly unremarkable. ? ?Did see Dr. Glennon Mac after a fall down the stairs. Knee pain has improved. Had an injection in R knee.  Patient states that the knee is feeling significantly better at this  point. ? ? ? ?  ? ?No past medical history on file. ?Past Surgical History:  ?Procedure Laterality Date  ? [place] SURGERY  2014  ? right ankle fracture  1978  ? external pinning  ? ?Social History  ? ?Socioeconomic History  ? Marital status: Single  ?  Spouse name: Not on file  ? Number of children: Not on file  ? Years of education: Not on file  ? Highest education level: Some college, no degree  ?Occupational History  ? Not on file  ?Tobacco Use  ? Smoking status: Never  ? Smokeless tobacco: Never  ?Vaping Use  ? Vaping Use: Never used  ?Substance and Sexual Activity  ? Alcohol use: Yes  ?  Comment: rare  ? Drug use: No  ? Sexual activity: Yes  ?Other Topics Concern  ? Not on file  ?Social History Narrative  ? Not on file  ? ?Social Determinants of Health  ? ?Financial Resource Strain: Medium Risk  ? Difficulty of Paying Living Expenses: Somewhat hard  ?Food Insecurity: Food Insecurity Present  ? Worried About Charity fundraiser in the Last Year: Sometimes true  ? Ran Out of Food in the Last Year: Sometimes true  ?Transportation Needs: No Transportation Needs  ? Lack of Transportation (Medical): No  ? Lack of Transportation (Non-Medical): No  ?Physical Activity: Insufficiently Active  ? Days of Exercise per Week: 2 days  ? Minutes of Exercise per Session: 30 min  ?Stress: Stress Concern Present  ? Feeling of Stress : To  some extent  ?Social Connections: Moderately Isolated  ? Frequency of Communication with Friends and Family: Twice a week  ? Frequency of Social Gatherings with Friends and Family: Once a week  ? Attends Religious Services: 1 to 4 times per year  ? Active Member of Clubs or Organizations: No  ? Attends Archivist Meetings: Not on file  ? Marital Status: Divorced  ? ?No Known Allergies ?Family History  ?Problem Relation Age of Onset  ? High blood pressure Mother   ? ? ? ? ?Current Outpatient Medications (Respiratory):  ?  benzonatate (TESSALON PERLES) 100 MG capsule, 1-2 capsules up  to twice daily as needed for cough ? ? ? ?Current Outpatient Medications (Other):  ?  Blood Pressure Monitoring (ADULT BLOOD PRESSURE CUFF LG) KIT, 1 Device by Does not apply route daily. ?  gabapentin (NEURONTIN) 300 MG capsule, Take 1 capsule (300 mg total) by mouth at bedtime. ?  GARLIC PO, Take by mouth daily. ? ? ?Reviewed prior external information including notes and imaging from  ?primary care provider ?As well as notes that were available from care everywhere and other healthcare systems. ? ?Past medical history, social, surgical and family history all reviewed in electronic medical record.  No pertanent information unless stated regarding to the chief complaint.  ? ?Review of Systems: ? No headache, visual changes, nausea, vomiting, diarrhea, constipation, dizziness, abdominal pain, skin rash, fevers, chills, night sweats, weight loss, swollen lymph nodes, body aches, joint swelling, chest pain, shortness of breath, mood changes. POSITIVE muscle aches ? ?Objective  ?Blood pressure (!) 142/84, pulse (!) 53, height _0  (1.905 m), weight 234 lb (106.1 kg), SpO2 96 %. ?  ?General: No apparent distress alert and oriented x3 mood and affect normal, dressed appropriately.  ?HEENT: Pupils equal, extraocular movements intact  ?Respiratory: Patient's speak in full sentences and does not appear short of breath  ?Cardiovascular: No lower extremity edema, non tender, no erythema  ?Gait normal with good balance and coordination.  ?MSK: Patient continues to have 4-5 strength in lower extremities that is weaker than would be anticipated.  Does have some arthritic changes noted of the knees bilaterally right greater than left.  Deep tendon reflexes are intact. ? ? ?  ?Impression and Recommendations:  ?  ? ?The above documentation has been reviewed and is accurate and complete Michael Pulley, DO  ? ? ?

## 2022-02-01 ENCOUNTER — Encounter: Payer: Self-pay | Admitting: Family Medicine

## 2022-02-01 ENCOUNTER — Ambulatory Visit: Payer: Federal, State, Local not specified - PPO | Admitting: Family Medicine

## 2022-02-01 VITALS — BP 142/84 | HR 53 | Ht 75.0 in | Wt 234.0 lb

## 2022-02-01 DIAGNOSIS — M25562 Pain in left knee: Secondary | ICD-10-CM | POA: Diagnosis not present

## 2022-02-01 DIAGNOSIS — M5441 Lumbago with sciatica, right side: Secondary | ICD-10-CM | POA: Diagnosis not present

## 2022-02-01 DIAGNOSIS — M5442 Lumbago with sciatica, left side: Secondary | ICD-10-CM | POA: Diagnosis not present

## 2022-02-01 DIAGNOSIS — M25561 Pain in right knee: Secondary | ICD-10-CM

## 2022-02-01 DIAGNOSIS — G8929 Other chronic pain: Secondary | ICD-10-CM

## 2022-02-01 NOTE — Patient Instructions (Signed)
B [facility] They will call you ?Can increase activity ?Have fun with your garden ?See me in 2-3 months ?

## 2022-02-01 NOTE — Assessment & Plan Note (Signed)
Patient continues to have significant back pain overall.  We have done significant imaging and ruled out at this point abdominal as well with a fairly regular CT abdomen pelvis.  Patient back did not respond extremely well to the epidurals but some mild.  I do believe that over the course of time patient has made some improvement with the different modalities we have done but patient continues to have some different call aspects in his legs as well.  At this point I do feel that a nerve conduction study could be beneficial.  We will see how patient responds to this and what information and gives [place] depending on that we will see if that changes any of them for medical management.  Total time with patient today greater than 31 minutes ?

## 2022-02-05 ENCOUNTER — Encounter (HOSPITAL_BASED_OUTPATIENT_CLINIC_OR_DEPARTMENT_OTHER): Payer: Self-pay | Admitting: Physical Therapy

## 2022-02-12 ENCOUNTER — Encounter: Payer: Self-pay | Admitting: Physical Medicine & Rehabilitation

## 2022-02-14 ENCOUNTER — Telehealth: Payer: Self-pay | Admitting: Physical Medicine and Rehabilitation

## 2022-02-14 ENCOUNTER — Encounter: Payer: Self-pay | Admitting: Family Medicine

## 2022-02-14 NOTE — Telephone Encounter (Signed)
Patient called. Would like an appointment with Dr. Newton  

## 2022-02-16 ENCOUNTER — Telehealth: Payer: Self-pay | Admitting: Physical Medicine and Rehabilitation

## 2022-02-16 NOTE — Telephone Encounter (Signed)
Questioning the appt time on 5-17 did you place him on the sch---pick a time --he will see it on Mychart  ?

## 2022-02-23 NOTE — Telephone Encounter (Signed)
Pt stopped by the office checking on the status of the medical records. I had him fill out a medical records form and advise that it will be a possibility that we might not have them in our system since his Dr. wasn't a [facility] practice at the time the pt was being seen.  ? ?FYI. ?

## 2022-02-23 NOTE — Telephone Encounter (Signed)
Noted once medical forms are obtained they will be given to PCP  ?

## 2022-03-26 NOTE — Progress Notes (Unsigned)
Cardiology Office Note:    Date:  03/27/2022   ID:  Michael [name], DOB [birth year], MRN [id number]  PCP:  [name], MD   [facility] Providers Cardiologist:  Dene Nazir    Referring MD: [name], MD   No chief complaint on file.  January 05 2022  Michael [name] is a 66 y.o. male with a hx of ventricular bigeminy   No CP , no dyspnea. Exercises regularly  Bikes, walks regularly  Has some spinal issues that keeps him from exercising as much as he would want .  Had a physical ,  HR was 40 ECG  showed ventricular bigeminy   Has had some weight gain ,  ( due to lack of exercise )  Doesn't work any long  Previously worked at the [employer]    Mar 27, 2022 [name] is seen today for follow up of his ventricular bigeminy. Event monitor shows NSR, sinus brady, Sinus tacy, frequent PVS PVC burden is 19% Echo  was ordered - not done yet Has seen Dr. Melvern Banker in the past for his PVCs  Thinks he may have had an echo in the past   .      No past medical history on file.  Past Surgical History:  Procedure Laterality Date   LUMBAR [place] SURGERY  2014   right ankle fracture  1978   external pinning    Current Medications: Current Meds  Medication Sig   gabapentin (NEURONTIN) 300 MG capsule Take 1 capsule (300 mg total) by mouth at bedtime.   GARLIC PO Take by mouth daily.   [DISCONTINUED] benzonatate (TESSALON PERLES) 100 MG capsule 1-2 capsules up to twice daily as needed for cough   [DISCONTINUED] Blood Pressure Monitoring (ADULT BLOOD PRESSURE CUFF LG) KIT 1 Device by Does not apply route daily.     Allergies:   Patient has no known allergies.   Social History   Socioeconomic History   Marital status: Single    Spouse name: Not on file   Number of children: Not on file   Years of education: Not on file   Highest education level: Some college, no degree  Occupational History   Not on file  Tobacco Use   Smoking status: Never   Smokeless  tobacco: Never  Vaping Use   Vaping Use: Never used  Substance and Sexual Activity   Alcohol use: Yes    Comment: rare   Drug use: No   Sexual activity: Yes  Other Topics Concern   Not on file  Social History Narrative   Not on file   Social Determinants of Health   Financial Resource Strain: Medium Risk   Difficulty of Paying Living Expenses: Somewhat hard  Food Insecurity: Food Insecurity Present   Worried About Running Out of Food in the Last Year: Sometimes true   Ran Out of Food in the Last Year: Sometimes true  Transportation Needs: No Transportation Needs   Lack of Transportation (Medical): No   Lack of Transportation (Non-Medical): No  Physical Activity: Insufficiently Active   Days of Exercise per Week: 2 days   Minutes of Exercise per Session: 30 min  Stress: Stress Concern Present   Feeling of Stress : To some extent  Social Connections: Moderately Isolated   Frequency of Communication with Friends and Family: Twice a week   Frequency of Social Gatherings with Friends and Family: Once a week   Attends Religious Services: 1 to 4 times per year  Active Member of Clubs or Organizations: No   Attends Archivist Meetings: Not on file   Marital Status: Divorced     Family History: The patient's family history includes High blood pressure in his mother.  ROS:   Please see the history of present illness.     All other systems reviewed and are negative.  EKGs/Labs/Other Studies Reviewed:    The following studies were reviewed today:   EKG:     Recent Labs: 07/20/2021: ALT 11; Hemoglobin 14.2; Platelets 154.0 01/05/2022: BUN 16; Creatinine, Ser 0.83; Potassium 4.4; Sodium 146; TSH 4.280  Recent Lipid Panel    Component Value Date/Time   CHOL 164 02/01/2021 1000   TRIG 39.0 02/01/2021 1000   HDL 49.60 02/01/2021 1000   CHOLHDL 3 02/01/2021 1000   VLDL 7.8 02/01/2021 1000   LDLCALC 106 (H) 02/01/2021 1000     Risk Assessment/Calculations:            Physical Exam:    Physical Exam: Blood pressure (!) 158/88, pulse (!) 52, height _0  (1.905 m), weight 239 lb (108.4 kg), SpO2 97 %.  GEN:  Well nourished, well developed in no acute distress HEENT: Normal NECK: No JVD; No carotid bruits LYMPHATICS: No lymphadenopathy CARDIAC: RRR frequent PVCs in bigeminal pattern  RESPIRATORY:  Clear to auscultation without rales, wheezing or rhonchi  ABDOMEN: Soft, non-tender, non-distended MUSCULOSKELETAL:  No edema; No deformity  SKIN: Warm and dry NEUROLOGIC:  Alert and oriented x 3   ASSESSMENT:    1. DOE (dyspnea on exertion)     PLAN:      Frequent premature ventricular contractions:  His PVC burden is 19%. Will get an echo to assess his LV Function. If he has preserved LV function , we will continue current observation  If his LV function is reduced, will refer to EP for further advice His baseline HR is in the 50s  ( I verified with stethoscope )   Will see him in a year     Medication Adjustments/Labs and Tests Ordered: Current medicines are reviewed at length with the patient today.  Concerns regarding medicines are outlined above.  Orders Placed This Encounter  Procedures   ECHOCARDIOGRAM COMPLETE   No orders of the defined types were placed in this encounter.   Patient Instructions  Medication Instructions:  Your physician recommends that you continue on your current medications as directed. Please refer to the Current Medication list given to you today.  *If you need a refill on your cardiac medications before your next appointment, please call your pharmacy*   Lab Work: NONE If you have labs (blood work) drawn today and your tests are completely normal, you will receive your results only by: [place] (if you have MyChart) OR A paper copy in the mail If you have any lab test that is abnormal or we need to change your treatment, we will call you to review the  results.   Testing/Procedures: ECHO Your physician has requested that you have an echocardiogram. Echocardiography is a painless test that uses sound waves to create images of your heart. It provides your doctor with information about the size and shape of your heart and how well your heart's chambers and valves are working. This procedure takes approximately one hour. There are no restrictions for this procedure.    Follow-Up: At [facility], you and your health needs are our priority.  As part of our continuing mission to provide you with exceptional heart  care, we have created designated Provider Care Teams.  These Care Teams include your primary Cardiologist (physician) and Advanced Practice Providers (APPs -  Physician Assistants and Nurse Practitioners) who all work together to provide you with the care you need, when you need it.  Your next appointment:   1 year(s)  The format for your next appointment:   In Person  Provider:   Mertie Moores, MD   Important Information About Sugar         Signed, Mertie Moores, MD  03/27/2022 5:17 PM    [place]

## 2022-03-27 ENCOUNTER — Ambulatory Visit: Payer: Federal, State, Local not specified - PPO | Admitting: Cardiovascular Disease

## 2022-03-27 ENCOUNTER — Encounter: Payer: Self-pay | Admitting: Cardiovascular Disease

## 2022-03-27 VITALS — BP 158/88 | HR 52 | Ht 75.0 in | Wt 239.0 lb

## 2022-03-27 DIAGNOSIS — R0609 Other forms of dyspnea: Secondary | ICD-10-CM

## 2022-03-27 NOTE — Patient Instructions (Signed)
Medication Instructions:  Your physician recommends that you continue on your current medications as directed. Please refer to the Current Medication list given to you today.  *If you need a refill on your cardiac medications before your next appointment, please call your pharmacy*   Lab Work: NONE If you have labs (blood work) drawn today and your tests are completely normal, you will receive your results only by: [place] (if you have MyChart) OR A paper copy in the mail If you have any lab test that is abnormal or we need to change your treatment, we will call you to review the results.   Testing/Procedures: ECHO Your physician has requested that you have an echocardiogram. Echocardiography is a painless test that uses sound waves to create images of your heart. It provides your doctor with information about the size and shape of your heart and how well your heart's chambers and valves are working. This procedure takes approximately one hour. There are no restrictions for this procedure.    Follow-Up: At [facility], you and your health needs are our priority.  As part of our continuing mission to provide you with exceptional heart care, we have created designated Provider Care Teams.  These Care Teams include your primary Cardiologist (physician) and Advanced Practice Providers (APPs -  Physician Assistants and Nurse Practitioners) who all work together to provide you with the care you need, when you need it.  Your next appointment:   1 year(s)  The format for your next appointment:   In Person  Provider:   Mertie Moores, MD   Important Information About [place]

## 2022-04-04 NOTE — Progress Notes (Signed)
[place] Phone: [phone] Subjective:   Fontaine No, am serving as a scribe for Dr. Hulan Saas.   I'm seeing this patient by the request  of:  [name], MD (Inactive)  CC: Back pain follow-up  XIP:[id number]  02/01/2022 Patient continues to have significant back pain overall.  We have done significant imaging and ruled out at this point abdominal as well with a fairly regular CT abdomen pelvis.  Patient back did not respond extremely well to the epidurals but some mild.  I do believe that over the course of time patient has made some improvement with the different modalities we have done but patient continues to have some different call aspects in his legs as well.  At this point I do feel that a nerve conduction study could be beneficial.  We will see how patient responds to this and what information and gives [place] depending on that we will see if that changes any of them for medical management.  Total time with patient today greater than 31 minutes  Updated 04/05/2022 Trig Mcbryar is a 66 y.o. male coming in with complaint of LBP.  We were attempting to get a nerve conduction study.  We have had difficulty getting patient scheduled.  Patient states that when he sits for prolonged periods or is lying down he will have an increase in pain in L hip flexor. He also notes a locking sensation when he stands for a long time. Once he is able to move he is not as stiff. Also having similar symptoms in R hip but not as bad as L. Does feel like his symptoms are worsening.   Does feel like gabapentin is helping and he tried to take it prior to being active. Going to have nerve conduction study the end of this month.    Vitamin D in September was low at 15, has had elevation and prostate specific antigen but was downtrending over 2 years, sedimentation rate was increasing    No past medical history on  file. Past Surgical History:  Procedure Laterality Date   LUMBAR DISC SURGERY  2014   right ankle fracture  1978   external pinning   Social History   Socioeconomic History   Marital status: Single    Spouse name: Not on file   Number of children: Not on file   Years of education: Not on file   Highest education level: Some college, no degree  Occupational History   Not on file  Tobacco Use   Smoking status: Never   Smokeless tobacco: Never  Vaping Use   Vaping Use: Never used  Substance and Sexual Activity   Alcohol use: Yes    Comment: rare   Drug use: No   Sexual activity: Yes  Other Topics Concern   Not on file  Social History Narrative   Not on file   Social Determinants of Health   Financial Resource Strain: Medium Risk (12/04/2021)   Overall Financial Resource Strain (CARDIA)    Difficulty of Paying Living Expenses: Somewhat hard  Food Insecurity: Food Insecurity Present (12/04/2021)   Hunger Vital Sign    Worried About Running Out of Food in the Last Year: Sometimes true    Ran Out of Food in the Last Year: Sometimes true  Transportation Needs: No Transportation Needs (12/04/2021)   PRAPARE - Hydrologist (Medical): No  Lack of Transportation (Non-Medical): No  Physical Activity: Insufficiently Active (12/04/2021)   Exercise Vital Sign    Days of Exercise per Week: 2 days    Minutes of Exercise per Session: 30 min  Stress: Stress Concern Present (12/04/2021)   [place]    Feeling of Stress : To some extent  Social Connections: Moderately Isolated (12/04/2021)   Social Connection and Isolation Panel [NHANES]    Frequency of Communication with Friends and Family: Twice a week    Frequency of Social Gatherings with Friends and Family: Once a week    Attends Religious Services: 1 to 4 times per year    Active Member of [employer] or Organizations: No    Attends Programme researcher, broadcasting/film/video: Not on file    Marital Status: Divorced   No Known Allergies Family History  Problem Relation Age of Onset   High blood pressure Mother          Current Outpatient Medications (Other):    gabapentin (NEURONTIN) 300 MG capsule, Take 1 capsule (300 mg total) by mouth at bedtime.   GARLIC PO, Take by mouth daily.   Reviewed prior external information including notes and imaging from  primary care provider As well as notes that were available from care everywhere and other healthcare systems.  Past medical history, social, surgical and family history all reviewed in electronic medical record.  No pertanent information unless stated regarding to the chief complaint.   Review of Systems:  No headache, visual changes, nausea, vomiting, diarrhea, constipation, dizziness, abdominal pain, skin rash, fevers, chills, night sweats, weight loss, swollen lymph nodes, chest pain, shortness of breath, mood changes. POSITIVE muscle aches, body aches, joint swelling  Objective  Blood pressure (!) 132/92, pulse (!) 51, height '6\' 3"'$  (1.905 m), weight 237 lb (107.5 kg), SpO2 97 %.   General: No apparent distress alert and oriented x3 mood and affect normal, dressed appropriately.  HEENT: Pupils equal, extraocular movements intact  Respiratory: Patient's speak in full sentences and does not appear short of breath  Cardiovascular: No lower extremity edema, non tender, no erythema  Patient's low back does have significant loss of lordosis.  Patient does have good flexion but has some difficulty with extension.  Patient does have tightness with straight leg test bilaterally left greater than right.  Mild radicular symptoms of the left.  Limited range of motion with internal range of motion of the hips bilaterally.  Tightness noted over the sacroiliac joint.    Impression and Recommendations:    The above documentation has been reviewed and is accurate and complete [name], DO

## 2022-04-05 ENCOUNTER — Encounter: Payer: Self-pay | Admitting: Family Medicine

## 2022-04-05 ENCOUNTER — Telehealth: Payer: Self-pay

## 2022-04-05 ENCOUNTER — Ambulatory Visit (INDEPENDENT_AMBULATORY_CARE_PROVIDER_SITE_OTHER): Payer: Federal, State, Local not specified - PPO | Admitting: Family Medicine

## 2022-04-05 VITALS — BP 132/92 | HR 51 | Ht 75.0 in | Wt 237.0 lb

## 2022-04-05 DIAGNOSIS — M5441 Lumbago with sciatica, right side: Secondary | ICD-10-CM | POA: Diagnosis not present

## 2022-04-05 DIAGNOSIS — M5416 Radiculopathy, lumbar region: Secondary | ICD-10-CM

## 2022-04-05 DIAGNOSIS — M2550 Pain in unspecified joint: Secondary | ICD-10-CM | POA: Diagnosis not present

## 2022-04-05 DIAGNOSIS — M5442 Lumbago with sciatica, left side: Secondary | ICD-10-CM | POA: Diagnosis not present

## 2022-04-05 DIAGNOSIS — G8929 Other chronic pain: Secondary | ICD-10-CM

## 2022-04-05 LAB — IBC PANEL
Iron: 102 ug/dL (ref 42–165)
Saturation Ratios: 28 % (ref 20.0–50.0)
TIBC: 364 ug/dL (ref 250.0–450.0)
Transferrin: 260 mg/dL (ref 212.0–360.0)

## 2022-04-05 LAB — CBC WITH DIFFERENTIAL/PLATELET
Basophils Absolute: 0 10*3/uL (ref 0.0–0.1)
Basophils Relative: 0.4 % (ref 0.0–3.0)
Eosinophils Absolute: 0.2 10*3/uL (ref 0.0–0.7)
Eosinophils Relative: 4 % (ref 0.0–5.0)
HCT: 44.3 % (ref 39.0–52.0)
Hemoglobin: 14.7 g/dL (ref 13.0–17.0)
Lymphocytes Relative: 38.6 % (ref 12.0–46.0)
Lymphs Abs: 1.9 10*3/uL (ref 0.7–4.0)
MCHC: 33.1 g/dL (ref 30.0–36.0)
MCV: 69.8 fl — ABNORMAL LOW (ref 78.0–100.0)
Monocytes Absolute: 0.4 10*3/uL (ref 0.1–1.0)
Monocytes Relative: 8.9 % (ref 3.0–12.0)
Neutro Abs: 2.3 10*3/uL (ref 1.4–7.7)
Neutrophils Relative %: 48.1 % (ref 43.0–77.0)
Platelets: 152 10*3/uL (ref 150.0–400.0)
RBC: 6.35 Mil/uL — ABNORMAL HIGH (ref 4.22–5.81)
RDW: 16.2 % — ABNORMAL HIGH (ref 11.5–15.5)
WBC: 4.8 10*3/uL (ref 4.0–10.5)

## 2022-04-05 LAB — COMPREHENSIVE METABOLIC PANEL
ALT: 14 U/L (ref 0–53)
AST: 17 U/L (ref 0–37)
Albumin: 3.9 g/dL (ref 3.5–5.2)
Alkaline Phosphatase: 80 U/L (ref 39–117)
BUN: 17 mg/dL (ref 6–23)
CO2: 23 mEq/L (ref 19–32)
Calcium: 9.1 mg/dL (ref 8.4–10.5)
Chloride: 109 mEq/L (ref 96–112)
Creatinine, Ser: 0.75 mg/dL (ref 0.40–1.50)
GFR: 95.51 mL/min (ref >60.00)
Glucose, Bld: 84 mg/dL (ref 70–99)
Potassium: 3.8 mEq/L (ref 3.5–5.1)
Sodium: 141 mEq/L (ref 135–145)
Total Bilirubin: 0.5 mg/dL (ref 0.2–1.2)
Total Protein: 7.1 g/dL (ref 6.0–8.3)

## 2022-04-05 LAB — TSH: TSH: 4.45 u[IU]/mL (ref 0.35–5.50)

## 2022-04-05 LAB — TESTOSTERONE: Testosterone: 384.16 ng/dL (ref 300.00–890.00)

## 2022-04-05 LAB — VITAMIN B12: Vitamin B-12: 1303 pg/mL — ABNORMAL HIGH (ref 211–911)

## 2022-04-05 LAB — SEDIMENTATION RATE: Sed Rate: 15 mm/hr (ref 0–20)

## 2022-04-05 LAB — VITAMIN D 25 HYDROXY (VIT D DEFICIENCY, FRACTURES): VITD: 20.42 ng/mL — ABNORMAL LOW (ref 30.00–100.00)

## 2022-04-05 LAB — PSA: PSA: 7.5 ng/mL — ABNORMAL HIGH (ref 0.10–4.00)

## 2022-04-05 LAB — FERRITIN: Ferritin: 25.7 ng/mL (ref 22.0–322.0)

## 2022-04-05 LAB — URIC ACID: Uric Acid, Serum: 3.9 mg/dL — ABNORMAL LOW (ref 4.0–7.8)

## 2022-04-05 LAB — C-REACTIVE PROTEIN: CRP: <1 mg/dL (ref 0.5–20.0)

## 2022-04-05 NOTE — Telephone Encounter (Signed)
Patient called and wants to know if he should stop Gabapentin before having his EMG . He also wants to know if there's any interaction between the EMG and epidural injection. Please advise

## 2022-04-05 NOTE — Assessment & Plan Note (Signed)
Continue to have discomfort and pain that is out of proportion to the amount of palpation.  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  Patient has responded well to an L3-L4 left-sided epidural previously long time ago and will consider doing that again.  Patient did respond well to formal physical therapy previously but does not feel like he has made as much improvement with the home exercises.  Continues to have the radicular symptoms.  Okay to take the gabapentin up to 3 times a day if needed.  Discussed with patient  I feel that we should get a laboratory work-up as well to make sure nothing else is potentially contributing.  Follow-up with me after this as well as the nerve conduction study to see how patient has responded.

## 2022-04-05 NOTE — Patient Instructions (Addendum)
Increase gabapentin up to 3x a day L3/L4 L epidural [phone] Labs today Make appt in 2 months

## 2022-04-06 NOTE — Telephone Encounter (Signed)
Left voicemail to return call to clinic to inform him that there are no interferences with the medication, injection and EMG

## 2022-04-06 NOTE — Telephone Encounter (Signed)
Task completed. Patient aware of Dr. Letta Pate reply.

## 2022-04-09 ENCOUNTER — Encounter: Payer: Self-pay | Admitting: Family Medicine

## 2022-04-09 LAB — PTH, INTACT AND CALCIUM
Calcium: 9 mg/dL (ref 8.6–10.3)
PTH: 66 pg/mL (ref 16–77)

## 2022-04-09 LAB — RHEUMATOID FACTOR: Rheumatoid fact SerPl-aCnc: <14 IU/mL (ref <14)

## 2022-04-09 LAB — ANTI-NUCLEAR AB-TITER (ANA TITER): ANA Titer 1: 1:40 {titer} — ABNORMAL HIGH

## 2022-04-09 LAB — ANGIOTENSIN CONVERTING ENZYME: Angiotensin-Converting Enzyme: 47 U/L (ref 9–67)

## 2022-04-09 LAB — ANA: Anti Nuclear Antibody (ANA): POSITIVE — AB

## 2022-04-09 LAB — CALCIUM, IONIZED: Calcium, Ion: 5 mg/dL (ref 4.7–5.5)

## 2022-04-09 LAB — CYCLIC CITRUL PEPTIDE ANTIBODY, IGG: Cyclic Citrullin Peptide Ab: <16 UNITS

## 2022-04-09 NOTE — Telephone Encounter (Deleted)
Left voicemail to return call to clinic informing him that there is no reason to stop gabapentin or put off the injection

## 2022-04-09 NOTE — Telephone Encounter (Signed)
Patient has been made aware of Dr. Letta Pate reply

## 2022-04-12 ENCOUNTER — Ambulatory Visit
Admission: RE | Admit: 2022-04-12 | Discharge: 2022-04-12 | Disposition: A | Discharge status: Home / Self Care | Payer: Federal, State, Local not specified - PPO | Source: Ambulatory Visit | Attending: Family Medicine | Admitting: Family Medicine

## 2022-04-12 ENCOUNTER — Ambulatory Visit (HOSPITAL_COMMUNITY): Payer: Federal, State, Local not specified - PPO

## 2022-04-12 DIAGNOSIS — M5416 Radiculopathy, lumbar region: Secondary | ICD-10-CM

## 2022-04-12 DIAGNOSIS — M47817 Spondylosis without myelopathy or radiculopathy, lumbosacral region: Secondary | ICD-10-CM | POA: Diagnosis not present

## 2022-04-12 NOTE — Discharge Instructions (Signed)

## 2022-04-18 ENCOUNTER — Ambulatory Visit (HOSPITAL_COMMUNITY)
Admission: RE | Admit: 2022-04-18 | Discharge: 2022-04-18 | Disposition: A | Discharge status: Home / Self Care | Payer: Federal, State, Local not specified - PPO | Source: Ambulatory Visit | Attending: Cardiovascular Disease | Admitting: Cardiovascular Disease

## 2022-04-18 DIAGNOSIS — R0609 Other forms of dyspnea: Secondary | ICD-10-CM | POA: Diagnosis not present

## 2022-04-18 DIAGNOSIS — I517 Cardiomegaly: Secondary | ICD-10-CM | POA: Diagnosis not present

## 2022-04-18 DIAGNOSIS — I340 Nonrheumatic mitral (valve) insufficiency: Secondary | ICD-10-CM | POA: Diagnosis not present

## 2022-04-18 DIAGNOSIS — I493 Ventricular premature depolarization: Secondary | ICD-10-CM | POA: Diagnosis not present

## 2022-04-18 LAB — ECHOCARDIOGRAM COMPLETE
Area-P 1/2: 1.96 cm2
Calc EF: 59.2 %
S' Lateral: 4.8 cm
Single Plane A2C EF: 59.9 %
Single Plane A4C EF: 60.1 %

## 2022-04-20 ENCOUNTER — Encounter: Payer: Self-pay | Admitting: Physical Medicine & Rehabilitation

## 2022-04-20 ENCOUNTER — Encounter
Payer: Federal, State, Local not specified - PPO | Attending: Physical Medicine & Rehabilitation | Admitting: Physical Medicine & Rehabilitation

## 2022-04-20 ENCOUNTER — Telehealth: Payer: Self-pay

## 2022-04-20 VITALS — BP 146/90 | HR 91 | Ht 75.0 in | Wt 237.0 lb

## 2022-04-20 DIAGNOSIS — I493 Ventricular premature depolarization: Secondary | ICD-10-CM

## 2022-04-20 DIAGNOSIS — M5442 Lumbago with sciatica, left side: Secondary | ICD-10-CM | POA: Diagnosis not present

## 2022-04-20 DIAGNOSIS — R0609 Other forms of dyspnea: Secondary | ICD-10-CM

## 2022-04-20 DIAGNOSIS — G8929 Other chronic pain: Secondary | ICD-10-CM | POA: Insufficient documentation

## 2022-04-20 DIAGNOSIS — M5441 Lumbago with sciatica, right side: Secondary | ICD-10-CM | POA: Diagnosis not present

## 2022-04-20 NOTE — Telephone Encounter (Signed)
Reviewed ECHO with patient who understands and all questions answered. Order for repeat ECHO in 1 year placed at this time.

## 2022-04-24 ENCOUNTER — Telehealth: Payer: Self-pay | Admitting: Family Medicine

## 2022-04-24 ENCOUNTER — Ambulatory Visit: Payer: Federal, State, Local not specified - PPO | Admitting: Physical Medicine & Rehabilitation

## 2022-04-24 NOTE — Telephone Encounter (Signed)
Pt anxiously awaiting Dr. Michaelle Copas review of the EMG he had done 6/23.

## 2022-04-27 NOTE — Telephone Encounter (Signed)
Called [facility]. They have sent the results to scanning and recommend that we keep checking there for results. They do not have hard copy. Patient notified.

## 2022-05-09 ENCOUNTER — Other Ambulatory Visit: Payer: Self-pay

## 2022-05-09 DIAGNOSIS — G8929 Other chronic pain: Secondary | ICD-10-CM

## 2022-05-16 ENCOUNTER — Telehealth: Payer: Self-pay | Admitting: Family Medicine

## 2022-05-16 ENCOUNTER — Other Ambulatory Visit: Payer: Self-pay

## 2022-05-16 MED ORDER — GABAPENTIN 300 MG PO CAPS: 300.0000 mg | ORAL_CAPSULE | Freq: Three times a day (TID) | ORAL | 1 refills | Status: DC

## 2022-05-16 NOTE — Telephone Encounter (Signed)
Rx filled per a verbal at [facility]. Called patient and told him that Dr. Tamala Julian recommends no more than 3 in a day. Patient voices understanding.

## 2022-05-16 NOTE — Telephone Encounter (Signed)
Patient called in reference to a refill needed on gabapentin (NEURONTIN) 300 MG capsule. He said that he was previously told to increase what he was taking so he has run out sooner than he is due for a refill. His insurance will not cover a sooner refill but was told that if we send in a new prescription, then they should pay for it.   He has currently been taking 2 at bedtime but the originally prescription was for 1 a day.  Please advise.

## 2022-06-06 NOTE — Progress Notes (Unsigned)
[place] Phone: [phone] Subjective:   Fontaine No, am serving as a scribe for Dr. Hulan Saas.  I'm seeing this patient by the request  of:  [name], MD (Inactive)  CC: low back pain   HUD:[id number]  04/05/2022 Continue to have discomfort and pain that is out of proportion to the amount of palpation.  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  Patient has responded well to an L3-L4 left-sided epidural previously long time ago and will consider doing that again.  Patient did respond well to formal physical therapy previously but does not feel like he has made as much improvement with the home exercises.  Continues to have the radicular symptoms.  Okay to take the gabapentin up to 3 times a day if needed.  Discussed with patient   I feel that we should get a laboratory work-up as well to make sure nothing else is potentially contributing.  Follow-up with me after this as well as the nerve conduction study to see how patient has responded.  Updated 06/07/2022 Michael [name] is a 66 y.o. male coming in with complaint of LBP and polyarthralgia. Epi on 04/12/2022. Said that this epidural was helpful. Seeing [facility] on August 18th. Notes a tightness in L leg if he steps on something and he feels like he will lose his balance. If he hits L hip on a wall he will feel pain in lower back. Notes that when he falls asleep on the couch and he is leaning toward the L his back pain is alleviated but when he leans to the R and his pain is exacerbated.        No past medical history on file. Past Surgical History:  Procedure Laterality Date   LUMBAR DISC SURGERY  2014   right ankle fracture  1978   external pinning   Social History   Socioeconomic History   Marital status: Single    Spouse name: Not on file   Number of children: Not on file    Years of education: Not on file   Highest education level: Some college, no degree  Occupational History   Not on file  Tobacco Use   Smoking status: Never   Smokeless tobacco: Never  Vaping Use   Vaping Use: Never used  Substance and Sexual Activity   Alcohol use: Yes    Comment: rare   Drug use: No   Sexual activity: Yes  Other Topics Concern   Not on file  Social History Narrative   Not on file   Social Determinants of Health   Financial Resource Strain: Medium Risk (12/04/2021)   Overall Financial Resource Strain (CARDIA)    Difficulty of Paying Living Expenses: Somewhat hard  Food Insecurity: Food Insecurity Present (12/04/2021)   Hunger Vital Sign    Worried About Running Out of Food in the Last Year: Sometimes true    Ran Out of Food in the Last Year: Sometimes true  Transportation Needs: No Transportation Needs (12/04/2021)   PRAPARE - Hydrologist (Medical): No    Lack of Transportation (Non-Medical): No  Physical Activity: Insufficiently Active (12/04/2021)   Exercise Vital Sign    Days of Exercise per Week: 2 days    Minutes of Exercise per Session: 30 min  Stress: Stress Concern Present (12/04/2021)   [employer] -  Occupational Stress Questionnaire    Feeling of Stress : To some extent  Social Connections: Moderately Isolated (12/04/2021)   Social Connection and Isolation Panel [NHANES]    Frequency of Communication with Friends and Family: Twice a week    Frequency of Social Gatherings with Friends and Family: Once a week    Attends Religious Services: 1 to 4 times per year    Active Member of [employer] or Organizations: No    Attends Music therapist: Not on file    Marital Status: Divorced   No Known Allergies Family History  Problem Relation Age of Onset   High blood pressure Mother          Current Outpatient Medications (Other):    gabapentin (NEURONTIN) 300 MG capsule, Take 1 capsule  (300 mg total) by mouth 3 (three) times daily.   GARLIC PO, Take by mouth daily.   Reviewed prior external information including notes and imaging from  primary care provider As well as notes that were available from care everywhere and other healthcare systems.  Past medical history, social, surgical and family history all reviewed in electronic medical record.  No pertanent information unless stated regarding to the chief complaint.   Review of Systems:  No headache, visual changes, nausea, vomiting, diarrhea, constipation, dizziness, abdominal pain, skin rash, fevers, chills, night sweats, weight loss, swollen lymph nodes, , joint swelling, chest pain, shortness of breath, mood changes. POSITIVE muscle aches, body aches  Objective  Blood pressure (!) 152/84, pulse (!) 49, height '6\' 3"'$  (1.905 m), weight 238 lb (108 kg), SpO2 93 %.   General: No apparent distress alert and oriented x3 mood and affect normal, dressed appropriately.  HEENT: Pupils equal, extraocular movements intact  Respiratory: Patient's speak in full sentences and does not appear short of breath  Cardiovascular: No lower extremity edema, non tender, no erythema  Low back does have positive SLT this is on the left side with radicular symptoms in the S1 distribution.  Possibly mild weakness of the left lower extremity and 1+ DTR at the Achilles compared to the contralateral side.  On palpation the patient's back pain seems to be worse in the L3-L4 area consider the L5-S1 area     Impression and Recommendations:     The above documentation has been reviewed and is accurate and complete Michael Pulley, DO

## 2022-06-07 ENCOUNTER — Ambulatory Visit (INDEPENDENT_AMBULATORY_CARE_PROVIDER_SITE_OTHER): Payer: Federal, State, Local not specified - PPO | Admitting: Family Medicine

## 2022-06-07 DIAGNOSIS — M5442 Lumbago with sciatica, left side: Secondary | ICD-10-CM | POA: Diagnosis not present

## 2022-06-07 DIAGNOSIS — G8929 Other chronic pain: Secondary | ICD-10-CM | POA: Diagnosis not present

## 2022-06-07 DIAGNOSIS — M5441 Lumbago with sciatica, right side: Secondary | ICD-10-CM

## 2022-06-07 NOTE — Patient Instructions (Signed)
Talk to neurosurgery about scarring around L S1 nerve or if fusion is necessary Read about Cymbalta Send [place] a message after you see neurosurgery

## 2022-06-07 NOTE — Assessment & Plan Note (Signed)
Patient does have a low back pain with radicular symptoms on the left side.  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  In addition to this patient also has EMG showing that there is chronic neurologic aspect to this from his postsurgical changes.  Patient has responded to the L3-L4 epidural previously.  Was delayed about 24 hours and could have been secondary to distribution of the medication.  We have discussed again different treatment options with patient has failed such things as nerve root injections, he is on gabapentin, done formal physical therapy, aquatic therapy but continues to have pain that does affect daily activities.  Patient will be following up with neurosurgery to discuss possible surgical options sooner here if he has any questions.  Total time discussing with patient about this as well as the only alternative would be Cymbalta 33 minutes

## 2022-06-15 DIAGNOSIS — M5432 Sciatica, left side: Secondary | ICD-10-CM | POA: Diagnosis not present

## 2022-06-15 DIAGNOSIS — M961 Postlaminectomy syndrome, not elsewhere classified: Secondary | ICD-10-CM | POA: Diagnosis not present

## 2022-06-20 ENCOUNTER — Encounter: Payer: Self-pay | Admitting: Family Medicine

## 2022-06-21 DIAGNOSIS — M961 Postlaminectomy syndrome, not elsewhere classified: Secondary | ICD-10-CM | POA: Diagnosis not present

## 2022-06-21 DIAGNOSIS — M5416 Radiculopathy, lumbar region: Secondary | ICD-10-CM | POA: Diagnosis not present

## 2022-07-11 DIAGNOSIS — M5416 Radiculopathy, lumbar region: Secondary | ICD-10-CM | POA: Diagnosis not present

## 2022-08-09 DIAGNOSIS — M961 Postlaminectomy syndrome, not elsewhere classified: Secondary | ICD-10-CM | POA: Diagnosis not present

## 2022-08-09 DIAGNOSIS — M5416 Radiculopathy, lumbar region: Secondary | ICD-10-CM | POA: Diagnosis not present

## 2022-09-17 ENCOUNTER — Encounter: Payer: Self-pay | Admitting: Internal Medicine

## 2022-09-17 ENCOUNTER — Ambulatory Visit: Payer: Federal, State, Local not specified - PPO | Admitting: Internal Medicine

## 2022-09-17 VITALS — BP 180/104 | HR 64 | Temp 98.2°F | Resp 16 | Ht 75.0 in | Wt 246.0 lb

## 2022-09-17 DIAGNOSIS — I498 Other specified cardiac arrhythmias: Secondary | ICD-10-CM | POA: Diagnosis not present

## 2022-09-17 DIAGNOSIS — R972 Elevated prostate specific antigen [PSA]: Secondary | ICD-10-CM | POA: Diagnosis not present

## 2022-09-17 DIAGNOSIS — E785 Hyperlipidemia, unspecified: Secondary | ICD-10-CM

## 2022-09-17 DIAGNOSIS — I10 Essential (primary) hypertension: Secondary | ICD-10-CM | POA: Insufficient documentation

## 2022-09-17 LAB — PSA: PSA: 4.6

## 2022-09-17 NOTE — Progress Notes (Signed)
Subjective:  Patient ID: Michael [name], male    DOB: 11/25/1955  Age: 66 y.o. MRN: [id number]  CC: Hypertension   HPI Michael [name] presents for establishing.  He is active and denies chest pain, shortness of breath, diaphoresis, palpitations, edema, or fatigue.  Outpatient Medications Prior to Visit  Medication Sig Dispense Refill   gabapentin (NEURONTIN) 300 MG capsule Take 1 capsule (300 mg total) by mouth 3 (three) times daily. 353 capsule 1   GARLIC PO Take by mouth daily.     No facility-administered medications prior to visit.    ROS Review of Systems  Constitutional:  Negative for diaphoresis, fatigue and unexpected weight change.  HENT: Negative.    Eyes: Negative.  Negative for visual disturbance.  Respiratory:  Negative for apnea, cough, chest tightness and shortness of breath.   Cardiovascular:  Negative for chest pain, palpitations and leg swelling.  Gastrointestinal:  Negative for abdominal pain, constipation, diarrhea, nausea and vomiting.  Genitourinary: Negative.  Negative for difficulty urinating and hematuria.  Musculoskeletal: Negative.  Negative for neck pain.  Skin: Negative.   Neurological: Negative.  Negative for dizziness, weakness, light-headedness and headaches.  Hematological:  Negative for adenopathy. Does not bruise/bleed easily.  Psychiatric/Behavioral: Negative.      Objective:  BP (!) 180/104 (BP Location: Left Arm, Patient Position: Sitting, Cuff Size: Large) Comment: BP (L) 180/104 (R) 184/106  Pulse 64   Temp 98.2 F (36.8 C) (Oral)   Resp 16   Ht '6\' 3"'$  (1.905 m)   Wt 246 lb (111.6 kg)   SpO2 95%   BMI 30.75 kg/m   BP Readings from Last 3 Encounters:  09/17/22 (!) 180/104  06/07/22 (!) 152/84  04/20/22 (!) 146/90    Wt Readings from Last 3 Encounters:  09/17/22 246 lb (111.6 kg)  06/07/22 238 lb (108 kg)  04/20/22 237 lb (107.5 kg)    Physical Exam Vitals reviewed.  HENT:     Nose: Nose normal.      Mouth/Throat:     Mouth: Mucous membranes are moist.  Eyes:     General: No scleral icterus.    Conjunctiva/sclera: Conjunctivae normal.  Cardiovascular:     Rate and Rhythm: Normal rate and regular rhythm.     Heart sounds: Normal heart sounds, S1 normal and S2 normal. No murmur heard.    Comments: EKG- SR with PVC's/bigeminy - unchanged No LVH or Q waves Pulmonary:     Effort: Pulmonary effort is normal.     Breath sounds: No stridor. No wheezing, rhonchi or rales.  Abdominal:     General: Abdomen is flat.     Palpations: There is no mass.     Tenderness: There is no abdominal tenderness. There is no guarding.     Hernia: No hernia is present.  Musculoskeletal:     Cervical back: Neck supple.     Right lower leg: No edema.     Left lower leg: No edema.  Skin:    General: Skin is warm and dry.  Neurological:     General: No focal deficit present.     Mental Status: He is alert. Mental status is at baseline.  Psychiatric:        Mood and Affect: Mood normal.        Behavior: Behavior normal.     Lab Results  Component Value Date   WBC 4.8 04/05/2022   HGB 14.7 04/05/2022   HCT 44.3 04/05/2022   PLT 152.0 04/05/2022  GLUCOSE 89 09/17/2022   CHOL 164 02/01/2021   TRIG 39.0 02/01/2021   HDL 49.60 02/01/2021   LDLCALC 106 (H) 02/01/2021   ALT 14 04/05/2022   AST 17 04/05/2022   NA 140 09/17/2022   K 4.6 09/17/2022   CL 108 09/17/2022   CREATININE 0.81 09/17/2022   BUN 15 09/17/2022   CO2 27 09/17/2022   TSH 4.45 04/05/2022   PSA 4.6 09/17/2022    ECHOCARDIOGRAM COMPLETE  Result Date: 04/18/2022    ECHOCARDIOGRAM REPORT   Patient Name:   Michael [name] Folds Date of Exam: 04/18/2022 Medical Rec #:  [id number]          Height:       75.0 in Accession #:    [id number]         Weight:       237.0 lb Date of Birth:  [birth year]          BSA:          2.359 m Patient Age:    59 years           BP:           164/89 mmHg Patient Gender: M                  HR:           70  bpm. Exam Location:  Outpatient Procedure: 2D Echo, Cardiac Doppler and Color Doppler Indications:    Dyspnea on exertion R06.09  History:        Patient has no prior history of Echocardiogram examinations.                 Arrythmias:PVC.  Sonographer:    Michael [name] RDCS Referring Phys: [place]  1. Frequent PVCs. Left ventricular ejection fraction, by estimation, is 50 to 55%. The left ventricle has low normal function. The left ventricle has no regional wall motion abnormalities. The left ventricular internal cavity size was mildly dilated. Left ventricular diastolic parameters are indeterminate.  2. Right ventricular systolic function is normal. The right ventricular size is moderately enlarged. There is normal pulmonary artery systolic pressure. The estimated right ventricular systolic pressure is 82.9 mmHg.  3. Left atrial size was severely dilated.  4. Right atrial size was moderately dilated.  5. The mitral valve is normal in structure. Mild mitral valve regurgitation. No evidence of mitral stenosis.  6. The aortic valve is tricuspid. Aortic valve regurgitation is not visualized. No aortic stenosis is present.  7. Aortic dilatation noted. There is mild dilatation of the ascending aorta, measuring 39 mm.  8. The inferior vena cava is dilated in size with >50% respiratory variability, suggesting right atrial pressure of 8 mmHg. FINDINGS  Left Ventricle: Left ventricular ejection fraction, by estimation, is 50 to 55%. The left ventricle has low normal function. The left ventricle has no regional wall motion abnormalities. The left ventricular internal cavity size was mildly dilated. There is no left ventricular hypertrophy. Left ventricular diastolic parameters are indeterminate. Right Ventricle: The right ventricular size is moderately enlarged. Right vetricular wall thickness was not well visualized. Right ventricular systolic function is normal. There is normal pulmonary artery  systolic pressure. The tricuspid regurgitant velocity is 2.15 m/s, and with an assumed right atrial pressure of 8 mmHg, the estimated right ventricular systolic pressure is 56.2 mmHg. Left Atrium: Left atrial size was severely dilated. Right Atrium: Right atrial size was moderately dilated. Pericardium: There is no evidence  of pericardial effusion. Mitral Valve: The mitral valve is normal in structure. Mild mitral valve regurgitation. No evidence of mitral valve stenosis. Tricuspid Valve: The tricuspid valve is normal in structure. Tricuspid valve regurgitation is trivial. Aortic Valve: The aortic valve is tricuspid. Aortic valve regurgitation is not visualized. No aortic stenosis is present. Pulmonic Valve: The pulmonic valve was grossly normal. Pulmonic valve regurgitation is trivial. Aorta: The aortic root is normal in size and structure and aortic dilatation noted. There is mild dilatation of the ascending aorta, measuring 39 mm. Venous: The inferior vena cava is dilated in size with greater than 50% respiratory variability, suggesting right atrial pressure of 8 mmHg. IAS/Shunts: The interatrial septum was not well visualized.  LEFT VENTRICLE PLAX 2D LVIDd:         6.30 cm      Diastology LVIDs:         4.80 cm      LV e' medial:    6.31 cm/s LV PW:         1.00 cm      LV E/e' medial:  9.7 LV IVS:        1.00 cm      LV e' lateral:   8.15 cm/s LVOT diam:     2.50 cm      LV E/e' lateral: 7.5 LV SV:         104 LV SV Index:   44 LVOT Area:     4.91 cm  LV Volumes (MOD) LV vol d, MOD A2C: 149.0 ml LV vol d, MOD A4C: 183.0 ml LV vol s, MOD A2C: 59.7 ml LV vol s, MOD A4C: 73.1 ml LV SV MOD A2C:     89.3 ml LV SV MOD A4C:     183.0 ml LV SV MOD BP:      101.6 ml RIGHT VENTRICLE RV Basal diam:  5.10 cm RV Mid diam:    3.80 cm RV S prime:     10.90 cm/s TAPSE (M-mode): 2.1 cm LEFT ATRIUM              Index        RIGHT ATRIUM           Index LA diam:        4.50 cm  1.91 cm/m   RA Area:     29.90 cm LA Vol (A2C):    126.0 ml 53.42 ml/m  RA Volume:   109.00 ml 46.22 ml/m LA Vol (A4C):   105.0 ml 44.52 ml/m LA Biplane Vol: 120.0 ml 50.88 ml/m  AORTIC VALVE LVOT Vmax:   93.10 cm/s LVOT Vmean:  62.100 cm/s LVOT VTI:    0.211 m  AORTA Ao Root diam: 3.30 cm Ao Asc diam:  3.90 cm MITRAL VALVE               TRICUSPID VALVE MV Area (PHT): 1.96 cm    TR Peak grad:   18.5 mmHg MV Decel Time: 387 msec    TR Vmax:        215.00 cm/s MV E velocity: 61.10 cm/s MV A velocity: 45.10 cm/s  SHUNTS MV E/A ratio:  1.35        Systemic VTI:  0.21 m                            Systemic Diam: 2.50 cm Oswaldo Milian MD Electronically signed by Oswaldo Milian MD Signature Date/Time: 04/18/2022/11:40:17  PM    Final     .ri  Assessment & Plan:   Isayah was seen today for hypertension.  Diagnoses and all orders for this visit:  PSA elevation- His PSA is not rising.  This is a reassuring sign that he does not have prostate cancer. -     PSA, total and free; Future -     PSA, total and free  Accelerated hypertension- Labs are negative for secondary causes and endorgan damage.  EKG is negative for LVH.  Will treat with Dyazide and hydralazine. -     Aldosterone + renin activity w/ ratio; Future -     Urinalysis, Routine w reflex microscopic; Future -     Basic metabolic panel; Future -     EKG 12-Lead -     Urine drugs of abuse scrn w alc, routine (Ref Lab); Future -     Urine drugs of abuse scrn w alc, routine (Ref Lab) -     Basic metabolic panel -     Urinalysis, Routine w reflex microscopic -     Aldosterone + renin activity w/ ratio -     hydrALAZINE (APRESOLINE) 25 MG tablet; Take 1 tablet (25 mg total) by mouth 3 (three) times daily. -     triamterene-hydrochlorothiazide (DYAZIDE) 37.5-25 MG capsule; Take 1 each (1 capsule total) by mouth daily.  Ventricular bigeminy -     Urine drugs of abuse scrn w alc, routine (Ref Lab); Future -     Urine drugs of abuse scrn w alc, routine (Ref Lab)  Dyslipidemia, goal  LDL below 70- His ASCVD risk score is at 21%.  I recommended that he take a statin for CV risk reduction. -     atorvastatin (LIPITOR) 40 MG tablet; Take 1 tablet (40 mg total) by mouth daily.   I am having Luby B. Schaberg start on hydrALAZINE, triamterene-hydrochlorothiazide, and atorvastatin. I am also having him maintain his GARLIC PO and gabapentin.  Meds ordered this encounter  Medications   hydrALAZINE (APRESOLINE) 25 MG tablet    Sig: Take 1 tablet (25 mg total) by mouth 3 (three) times daily.    Dispense:  270 tablet    Refill:  0   triamterene-hydrochlorothiazide (DYAZIDE) 37.5-25 MG capsule    Sig: Take 1 each (1 capsule total) by mouth daily.    Dispense:  90 capsule    Refill:  0   atorvastatin (LIPITOR) 40 MG tablet    Sig: Take 1 tablet (40 mg total) by mouth daily.    Dispense:  90 tablet    Refill:  1     Follow-up: Return in about 4 weeks (around 10/15/2022).  Scarlette Calico, MD

## 2022-09-17 NOTE — Patient Instructions (Signed)
Hypertension, Adult High blood pressure (hypertension) is when the force of blood pumping through the arteries is too strong. The arteries are the blood vessels that carry blood from the heart throughout the body. Hypertension forces the heart to work harder to pump blood and may cause arteries to become narrow or stiff. Untreated or uncontrolled hypertension can lead to a heart attack, heart failure, a stroke, kidney disease, and other problems. A blood pressure reading consists of a higher number over a lower number. Ideally, your blood pressure should be below 120/80. The first ("top") number is called the systolic pressure. It is a measure of the pressure in your arteries as your heart beats. The second ("bottom") number is called the diastolic pressure. It is a measure of the pressure in your arteries as the heart relaxes. What are the causes? The exact cause of this condition is not known. There are some conditions that result in high blood pressure. What increases the risk? Certain factors may make you more likely to develop high blood pressure. Some of these risk factors are under your control, including: Smoking. Not getting enough exercise or physical activity. Being overweight. Having too much fat, sugar, calories, or salt (sodium) in your diet. Drinking too much alcohol. Other risk factors include: Having a personal history of heart disease, diabetes, high cholesterol, or kidney disease. Stress. Having a family history of high blood pressure and high cholesterol. Having obstructive sleep apnea. Age. The risk increases with age. What are the signs or symptoms? High blood pressure may not cause symptoms. Very high blood pressure (hypertensive crisis) may cause: Headache. Fast or irregular heartbeats (palpitations). Shortness of breath. Nosebleed. Nausea and vomiting. Vision changes. Severe chest pain, dizziness, and seizures. How is this diagnosed? This condition is diagnosed by  measuring your blood pressure while you are seated, with your arm resting on a flat surface, your legs uncrossed, and your feet flat on the floor. The cuff of the blood pressure monitor will be placed directly against the skin of your upper arm at the level of your heart. Blood pressure should be measured at least twice using the same arm. Certain conditions can cause a difference in blood pressure between your right and left arms. If you have a high blood pressure reading during one visit or you have normal blood pressure with other risk factors, you may be asked to: Return on a different day to have your blood pressure checked again. Monitor your blood pressure at home for 1 week or longer. If you are diagnosed with hypertension, you may have other blood or imaging tests to help your health care provider understand your overall risk for other conditions. How is this treated? This condition is treated by making healthy lifestyle changes, such as eating healthy foods, exercising more, and reducing your alcohol intake. You may be referred for counseling on a healthy diet and physical activity. Your health care provider may prescribe medicine if lifestyle changes are not enough to get your blood pressure under control and if: Your systolic blood pressure is above 130. Your diastolic blood pressure is above 80. Your personal target blood pressure may vary depending on your medical conditions, your age, and other factors. Follow these instructions at home: Eating and drinking  Eat a diet that is high in fiber and potassium, and low in sodium, added sugar, and fat. An example of this eating plan is called the DASH diet. DASH stands for Dietary Approaches to Stop Hypertension. To eat this way: Eat   plenty of fresh fruits and vegetables. Try to fill one half of your plate at each meal with fruits and vegetables. Eat whole grains, such as whole-wheat pasta, brown rice, or whole-grain bread. Fill about one  fourth of your plate with whole grains. Eat or drink low-fat dairy products, such as skim milk or low-fat yogurt. Avoid fatty cuts of meat, processed or cured meats, and poultry with skin. Fill about one fourth of your plate with lean proteins, such as fish, chicken without skin, beans, eggs, or tofu. Avoid pre-made and processed foods. These tend to be higher in sodium, added sugar, and fat. Reduce your daily sodium intake. Many people with hypertension should eat less than 1,500 mg of sodium a day. Do not drink alcohol if: Your health care provider tells you not to drink. You are pregnant, may be pregnant, or are planning to become pregnant. If you drink alcohol: Limit how much you have to: 0-1 drink a day for women. 0-2 drinks a day for men. Know how much alcohol is in your drink. In the [place] one drink equals one 12 oz bottle of beer (355 mL), one 5 oz glass of wine (148 mL), or one 1 oz glass of hard liquor (44 mL). Lifestyle  Work with your health care provider to maintain a healthy body weight or to lose weight. Ask what an ideal weight is for you. Get at least 30 minutes of exercise that causes your heart to beat faster (aerobic exercise) most days of the week. Activities may include walking, swimming, or biking. Include exercise to strengthen your muscles (resistance exercise), such as Pilates or lifting weights, as part of your weekly exercise routine. Try to do these types of exercises for 30 minutes at least 3 days a week. Do not use any products that contain nicotine or tobacco. These products include cigarettes, chewing tobacco, and vaping devices, such as e-cigarettes. If you need help quitting, ask your health care provider. Monitor your blood pressure at home as told by your health care provider. Keep all follow-up visits. This is important. Medicines Take over-the-counter and prescription medicines only as told by your health care provider. Follow directions carefully. Blood  pressure medicines must be taken as prescribed. Do not skip doses of blood pressure medicine. Doing this puts you at risk for problems and can make the medicine less effective. Ask your health care provider about side effects or reactions to medicines that you should watch for. Contact a health care provider if you: Think you are having a reaction to a medicine you are taking. Have headaches that keep coming back (recurring). Feel dizzy. Have swelling in your ankles. Have trouble with your vision. Get help right away if you: Develop a severe headache or confusion. Have unusual weakness or numbness. Feel faint. Have severe pain in your chest or abdomen. Vomit repeatedly. Have trouble breathing. These symptoms may be an emergency. Get help right away. Call 911. Do not wait to see if the symptoms will go away. Do not drive yourself to the hospital. Summary Hypertension is when the force of blood pumping through your arteries is too strong. If this condition is not controlled, it may put you at risk for serious complications. Your personal target blood pressure may vary depending on your medical conditions, your age, and other factors. For most people, a normal blood pressure is less than 120/80. Hypertension is treated with lifestyle changes, medicines, or a combination of both. Lifestyle changes include losing weight, eating a healthy,   low-sodium diet, exercising more, and limiting alcohol. This information is not intended to replace advice given to you by your health care provider. Make sure you discuss any questions you have with your health care provider. Document Revised: 08/22/2021 Document Reviewed: 08/22/2021 Elsevier Patient Education  2023 Elsevier Inc.  

## 2022-09-18 LAB — URINALYSIS, ROUTINE W REFLEX MICROSCOPIC
Bilirubin Urine: NEGATIVE
Hgb urine dipstick: NEGATIVE
Ketones, ur: NEGATIVE
Leukocytes,Ua: NEGATIVE
Nitrite: NEGATIVE
Specific Gravity, Urine: 1.025 (ref 1.000–1.030)
Total Protein, Urine: NEGATIVE
Urine Glucose: NEGATIVE
Urobilinogen, UA: 0.2 (ref 0.0–1.0)
pH: 6.5 (ref 5.0–8.0)

## 2022-09-18 LAB — BASIC METABOLIC PANEL
BUN: 15 mg/dL (ref 6–23)
CO2: 27 mEq/L (ref 19–32)
Calcium: 9 mg/dL (ref 8.4–10.5)
Chloride: 108 mEq/L (ref 96–112)
Creatinine, Ser: 0.81 mg/dL (ref 0.40–1.50)
GFR: 93.02 mL/min (ref >60.00)
Glucose, Bld: 89 mg/dL (ref 70–99)
Potassium: 4.6 mEq/L (ref 3.5–5.1)
Sodium: 140 mEq/L (ref 135–145)

## 2022-09-18 MED ORDER — TRIAMTERENE-HCTZ 37.5-25 MG PO CAPS: 1.0000 | ORAL_CAPSULE | Freq: Every day | ORAL | 0 refills | Status: DC

## 2022-09-18 MED ORDER — HYDRALAZINE HCL 25 MG PO TABS: 25.0000 mg | ORAL_TABLET | Freq: Three times a day (TID) | ORAL | 0 refills | Status: DC

## 2022-09-19 LAB — URINE DRUGS OF ABUSE SCREEN W ALC, ROUTINE (REF LAB)
Amphetamines, Urine: NEGATIVE ng/mL
Barbiturate Quant, Ur: NEGATIVE ng/mL
Benzodiazepine Quant, Ur: NEGATIVE ng/mL
Cannabinoid Quant, Ur: NEGATIVE ng/mL
Cocaine (Metab.): NEGATIVE ng/mL
Ethanol, Urine: NEGATIVE %
Methadone Screen, Urine: NEGATIVE ng/mL
Opiate Quant, Ur: NEGATIVE ng/mL
PCP Quant, Ur: NEGATIVE ng/mL
Propoxyphene: NEGATIVE ng/mL

## 2022-09-23 DIAGNOSIS — E785 Hyperlipidemia, unspecified: Secondary | ICD-10-CM | POA: Insufficient documentation

## 2022-09-23 LAB — ALDOSTERONE + RENIN ACTIVITY W/ RATIO
ALDO / PRA Ratio: 77.8 Ratio — ABNORMAL HIGH (ref 0.9–28.9)
Aldosterone: 7 ng/dL
Renin Activity: 0.09 ng/mL/h — ABNORMAL LOW (ref 0.25–5.82)

## 2022-09-23 LAB — PSA, TOTAL AND FREE
PSA, % Free: 22 % (calc) — ABNORMAL LOW (ref >25)
PSA, Free: 1 ng/mL
PSA, Total: 4.6 ng/mL — ABNORMAL HIGH (ref <=4.0)

## 2022-09-23 MED ORDER — ATORVASTATIN CALCIUM 40 MG PO TABS: 40.0000 mg | ORAL_TABLET | Freq: Every day | ORAL | 1 refills | Status: DC

## 2022-09-25 ENCOUNTER — Encounter: Payer: Self-pay | Admitting: Family Medicine

## 2022-09-25 ENCOUNTER — Other Ambulatory Visit: Payer: Self-pay

## 2022-09-25 DIAGNOSIS — G8929 Other chronic pain: Secondary | ICD-10-CM

## 2022-09-27 DIAGNOSIS — M961 Postlaminectomy syndrome, not elsewhere classified: Secondary | ICD-10-CM | POA: Diagnosis not present

## 2022-09-27 DIAGNOSIS — M5416 Radiculopathy, lumbar region: Secondary | ICD-10-CM | POA: Diagnosis not present

## 2022-10-09 DIAGNOSIS — M47816 Spondylosis without myelopathy or radiculopathy, lumbar region: Secondary | ICD-10-CM | POA: Diagnosis not present

## 2022-10-09 DIAGNOSIS — M5416 Radiculopathy, lumbar region: Secondary | ICD-10-CM | POA: Diagnosis not present

## 2022-10-10 ENCOUNTER — Other Ambulatory Visit: Payer: Self-pay | Admitting: Student

## 2022-10-10 DIAGNOSIS — M5416 Radiculopathy, lumbar region: Secondary | ICD-10-CM

## 2022-11-01 NOTE — Discharge Instructions (Signed)

## 2022-11-02 ENCOUNTER — Ambulatory Visit
Admission: RE | Admit: 2022-11-02 | Discharge: 2022-11-02 | Disposition: A | Discharge status: Home / Self Care | Payer: Federal, State, Local not specified - PPO | Source: Ambulatory Visit | Attending: Student | Admitting: Student

## 2022-11-02 DIAGNOSIS — M4316 Spondylolisthesis, lumbar region: Secondary | ICD-10-CM | POA: Diagnosis not present

## 2022-11-02 DIAGNOSIS — M5416 Radiculopathy, lumbar region: Secondary | ICD-10-CM

## 2022-11-02 DIAGNOSIS — M2578 Osteophyte, vertebrae: Secondary | ICD-10-CM | POA: Diagnosis not present

## 2022-11-02 DIAGNOSIS — M5126 Other intervertebral disc displacement, lumbar region: Secondary | ICD-10-CM | POA: Diagnosis not present

## 2022-11-02 MED ORDER — DIAZEPAM 5 MG PO TABS: 10.0000 mg | ORAL_TABLET | Freq: Once | ORAL | Status: DC

## 2022-11-02 MED ORDER — ONDANSETRON HCL 4 MG/2ML IJ SOLN: 4.0000 mg | Freq: Once | INTRAMUSCULAR | Status: DC | PRN

## 2022-11-02 MED ORDER — MEPERIDINE HCL 50 MG/ML IJ SOLN: 50.0000 mg | Freq: Once | INTRAMUSCULAR | Status: DC | PRN

## 2022-11-02 MED ORDER — IOPAMIDOL (ISOVUE-M 200) INJECTION 41%
20.0000 mL | Freq: Once | INTRAMUSCULAR | Status: AC
  Administered 2022-11-02: 20 mL via INTRATHECAL

## 2022-11-08 ENCOUNTER — Encounter: Payer: Self-pay | Admitting: Family Medicine

## 2022-11-09 ENCOUNTER — Ambulatory Visit: Payer: Federal, State, Local not specified - PPO | Admitting: Family Medicine

## 2022-11-09 ENCOUNTER — Encounter: Payer: Self-pay | Admitting: Family Medicine

## 2022-11-09 ENCOUNTER — Ambulatory Visit (INDEPENDENT_AMBULATORY_CARE_PROVIDER_SITE_OTHER): Payer: Federal, State, Local not specified - PPO

## 2022-11-09 ENCOUNTER — Ambulatory Visit: Payer: Self-pay

## 2022-11-09 VITALS — BP 130/70 | HR 80 | Ht 75.0 in | Wt 246.0 lb

## 2022-11-09 DIAGNOSIS — S5400XA Injury of ulnar nerve at forearm level, unspecified arm, initial encounter: Secondary | ICD-10-CM | POA: Insufficient documentation

## 2022-11-09 DIAGNOSIS — M25522 Pain in left elbow: Secondary | ICD-10-CM

## 2022-11-09 DIAGNOSIS — M25422 Effusion, left elbow: Secondary | ICD-10-CM | POA: Diagnosis not present

## 2022-11-09 MED ORDER — BACLOFEN 5 MG PO TABS: 5.0000 mg | ORAL_TABLET | Freq: Every day | ORAL | 1 refills | Status: DC

## 2022-11-09 NOTE — Assessment & Plan Note (Signed)
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  We discussed potentially increasing the gabapentin but being careful of potential side effects.  We discussed compression sleeves, icing regimen, x-rays are ordered to further evaluate for any other bony abnormality that could be contributing.  Discussed icing regimen and home exercises.  Follow-up again in 4 weeks

## 2022-11-09 NOTE — Progress Notes (Signed)
[place] Phone: [phone] Subjective:    I'm seeing this patient by the request  of:  [name], MD  CC: Elbow pain   FTD:[id number]  Michael [name] is a 66 y.o. male coming in with complaint of Left Elbow pain. Patients elbow is hurting because he was compensating for his legs when getting up out of chairs. Patient states that Wednesday patient was pushing himself up and the elbow was like an electrical feeling, has noticed some swelling. Patient will also sometimes have to pull his legs up or over when laying and he feels like that is also something that is causing the pain not to get better.       No past medical history on file. Past Surgical History:  Procedure Laterality Date   LUMBAR DISC SURGERY  2014   right ankle fracture  1978   external pinning   Social History   Socioeconomic History   Marital status: Single    Spouse name: Not on file   Number of children: Not on file   Years of education: Not on file   Highest education level: Some college, no degree  Occupational History   Not on file  Tobacco Use   Smoking status: Never    Passive exposure: Never   Smokeless tobacco: Never  Vaping Use   Vaping Use: Never used  Substance and Sexual Activity   Alcohol use: Not Currently    Comment: rare   Drug use: No   Sexual activity: Yes  Other Topics Concern   Not on file  Social History Narrative   Not on file   Social Determinants of Health   Financial Resource Strain: Medium Risk (12/04/2021)   Overall Financial Resource Strain (CARDIA)    Difficulty of Paying Living Expenses: Somewhat hard  Food Insecurity: Food Insecurity Present (12/04/2021)   Hunger Vital Sign    Worried About Running Out of Food in the Last Year: Sometimes true    Ran Out of Food in the Last Year: Sometimes true  Transportation Needs: No Transportation Needs (12/04/2021)   PRAPARE - Armed forces logistics/support/administrative officer (Medical): No    Lack of Transportation (Non-Medical): No  Physical Activity: Insufficiently Active (12/04/2021)   Exercise Vital Sign    Days of Exercise per Week: 2 days    Minutes of Exercise per Session: 30 min  Stress: Stress Concern Present (12/04/2021)   [place]    Feeling of Stress : To some extent  Social Connections: Moderately Isolated (12/04/2021)   Social Connection and Isolation Panel [NHANES]    Frequency of Communication with Friends and Family: Twice a week    Frequency of Social Gatherings with Friends and Family: Once a week    Attends Religious Services: 1 to 4 times per year    Active Member of [employer] or Organizations: No    Attends Music therapist: Not on file    Marital Status: Divorced   No Known Allergies Family History  Problem Relation Age of Onset   High blood pressure Mother    Prostate cancer Father      Current Outpatient Medications (Cardiovascular):    atorvastatin (LIPITOR) 40 MG tablet, Take 1 tablet (40 mg total) by mouth daily.   hydrALAZINE (APRESOLINE) 25 MG tablet, Take 1 tablet (25 mg total) by mouth 3 (three) times daily.   triamterene-hydrochlorothiazide (  DYAZIDE) 37.5-25 MG capsule, Take 1 each (1 capsule total) by mouth daily.     Current Outpatient Medications (Other):    Baclofen 5 MG TABS, Take 5 mg by mouth at bedtime.   gabapentin (NEURONTIN) 300 MG capsule, Take 1 capsule (300 mg total) by mouth 3 (three) times daily.   GARLIC PO, Take by mouth daily.   Reviewed prior external information including notes and imaging from  primary care provider As well as notes that were available from care everywhere and other healthcare systems.  Past medical history, social, surgical and family history all reviewed in electronic medical record.  No pertanent information unless stated regarding to the chief complaint.   Review of  Systems:  No headache, visual changes, nausea, vomiting, diarrhea, constipation, dizziness, abdominal pain, skin rash, fevers, chills, night sweats, weight loss, swollen lymph nodes, body aches, joint swelling, chest pain, shortness of breath, mood changes. POSITIVE muscle aches  Objective  Blood pressure 130/70, pulse 80, height '6\' 3"'$  (1.905 m), weight 246 lb (111.6 kg), SpO2 97 %.   General: No apparent distress alert and oriented x3 mood and affect normal, dressed appropriately.  HEENT: Pupils equal, extraocular movements intact  Respiratory: Patient's speak in full sentences and does not appear short of breath  Cardiovascular: No lower extremity edema, non tender, no erythema  Difficulty with ambulation using a cane for walking. Patient is having tenderness to palpation over the elbow itself.  Seems to be minorly swollen.  Patient is severely tender over the medial aspect.. Full strength noted with flexion and extension at the wrist.  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   Impression and Recommendations:    Limited muscular skeletal ultrasound was performed and interpreted by Hulan Saas, M

## 2022-11-09 NOTE — Patient Instructions (Signed)
Good to see you  Compression sleeve for the elbow Get x ray on your way out  Baclofen '5mg'$  tabs Stop tizanidine Talk to Dr. Patrica Duel about the back Avoid full extension and flexion Follow up 6 weeks

## 2022-11-09 NOTE — Progress Notes (Deleted)
   I, Peterson Lombard, LAT, ATC acting as a scribe for Lynne Leader, MD.  Michael [name] is a 67 y.o. male who presents to [place] at [facility] today for elbow pain. Pt was previously seen by Dr. Tamala Julian on 06/07/22 for LBP. Today, pt c/o elbow pain x /. Pt locates pain to  Grip strength: Radiates: Paresthesia: Aggravates: Treatments tried:   Pertinent review of systems: ***  Relevant historical information: ***   Exam:  There were no vitals taken for this visit. General: Well Developed, well nourished, and in no acute distress.   MSK: ***    Lab and Radiology Results No results found for this or any previous visit (from the past 72 hour(s)). No results found.     Assessment and Plan: 67 y.o. male with ***   PDMP not reviewed this encounter. No orders of the defined types were placed in this encounter.  No orders of the defined types were placed in this encounter.    Discussed warning signs or symptoms. Please see discharge instructions. Patient expresses understanding.   ***

## 2022-11-12 ENCOUNTER — Ambulatory Visit: Payer: Federal, State, Local not specified - PPO | Admitting: Family Medicine

## 2022-11-13 ENCOUNTER — Other Ambulatory Visit: Payer: Self-pay

## 2022-11-13 ENCOUNTER — Emergency Department (HOSPITAL_COMMUNITY): Payer: Federal, State, Local not specified - PPO

## 2022-11-13 ENCOUNTER — Encounter (HOSPITAL_COMMUNITY): Payer: Self-pay

## 2022-11-13 ENCOUNTER — Emergency Department (HOSPITAL_COMMUNITY)
Admission: EM | Admit: 2022-11-13 | Discharge: 2022-11-13 | Disposition: A | Discharge status: Home / Self Care | Payer: Federal, State, Local not specified - PPO | Attending: Emergency Medicine | Admitting: Emergency Medicine

## 2022-11-13 DIAGNOSIS — W01198A Fall on same level from slipping, tripping and stumbling with subsequent striking against other object, initial encounter: Secondary | ICD-10-CM | POA: Insufficient documentation

## 2022-11-13 DIAGNOSIS — R509 Fever, unspecified: Secondary | ICD-10-CM | POA: Diagnosis not present

## 2022-11-13 DIAGNOSIS — M62838 Other muscle spasm: Secondary | ICD-10-CM | POA: Insufficient documentation

## 2022-11-13 DIAGNOSIS — I491 Atrial premature depolarization: Secondary | ICD-10-CM | POA: Diagnosis not present

## 2022-11-13 DIAGNOSIS — S0990XA Unspecified injury of head, initial encounter: Secondary | ICD-10-CM | POA: Diagnosis not present

## 2022-11-13 DIAGNOSIS — I10 Essential (primary) hypertension: Secondary | ICD-10-CM | POA: Diagnosis not present

## 2022-11-13 DIAGNOSIS — W19XXXA Unspecified fall, initial encounter: Secondary | ICD-10-CM | POA: Diagnosis not present

## 2022-11-13 DIAGNOSIS — M25552 Pain in left hip: Secondary | ICD-10-CM | POA: Diagnosis not present

## 2022-11-13 LAB — CBC
HCT: 45.2 % (ref 39.0–52.0)
Hemoglobin: 15.1 g/dL (ref 13.0–17.0)
MCH: 22.6 pg — ABNORMAL LOW (ref 26.0–34.0)
MCHC: 33.4 g/dL (ref 30.0–36.0)
MCV: 67.6 fL — ABNORMAL LOW (ref 80.0–100.0)
Platelets: 169 10*3/uL (ref 150–400)
RBC: 6.69 MIL/uL — ABNORMAL HIGH (ref 4.22–5.81)
RDW: 17.2 % — ABNORMAL HIGH (ref 11.5–15.5)
WBC: 8.1 10*3/uL (ref 4.0–10.5)
nRBC: 0 % (ref 0.0–0.2)

## 2022-11-13 LAB — COMPREHENSIVE METABOLIC PANEL
ALT: 15 U/L (ref 0–44)
AST: 24 U/L (ref 15–41)
Albumin: 3.6 g/dL (ref 3.5–5.0)
Alkaline Phosphatase: 74 U/L (ref 38–126)
Anion gap: 13 (ref 5–15)
BUN: 16 mg/dL (ref 8–23)
CO2: 21 mmol/L — ABNORMAL LOW (ref 22–32)
Calcium: 9.3 mg/dL (ref 8.9–10.3)
Chloride: 107 mmol/L (ref 98–111)
Creatinine, Ser: 0.8 mg/dL (ref 0.61–1.24)
GFR, Estimated: >60 mL/min (ref >60)
Glucose, Bld: 126 mg/dL — ABNORMAL HIGH (ref 70–99)
Potassium: 4.2 mmol/L (ref 3.5–5.1)
Sodium: 141 mmol/L (ref 135–145)
Total Bilirubin: 0.5 mg/dL (ref 0.3–1.2)
Total Protein: 7.5 g/dL (ref 6.5–8.1)

## 2022-11-13 LAB — URINALYSIS, ROUTINE W REFLEX MICROSCOPIC
Bilirubin Urine: NEGATIVE
Glucose, UA: NEGATIVE mg/dL
Hgb urine dipstick: NEGATIVE
Ketones, ur: NEGATIVE mg/dL
Leukocytes,Ua: NEGATIVE
Nitrite: NEGATIVE
Protein, ur: 100 mg/dL — AB
Specific Gravity, Urine: 1.024 (ref 1.005–1.030)
pH: 6 (ref 5.0–8.0)

## 2022-11-13 LAB — MAGNESIUM: Magnesium: 1.9 mg/dL (ref 1.7–2.4)

## 2022-11-13 MED ORDER — LORAZEPAM 1 MG PO TABS
1.0000 mg | ORAL_TABLET | Freq: Once | ORAL | Status: AC
  Administered 2022-11-13: 1 mg via ORAL
  Filled 2022-11-13: qty 1

## 2022-11-13 MED ORDER — BACLOFEN 5 MG PO TABS: 5.0000 mg | ORAL_TABLET | Freq: Every day | ORAL | 0 refills | Status: DC

## 2022-11-13 MED ORDER — ACETAMINOPHEN 500 MG PO TABS
1000.0000 mg | ORAL_TABLET | ORAL | Status: AC
  Administered 2022-11-13: 1000 mg via ORAL
  Filled 2022-11-13: qty 2

## 2022-11-13 NOTE — ED Triage Notes (Addendum)
Pt BIB [facility] from home c/o a fall while going to the bathroom. Pt fell backwards and hit his head. Pt is also c/o left leg spasms as well that has been going on for awhile. PT denies LOC or blood thinner use. EMS gave 200 mcg of fentanyl for spasms. Pt denies any pain from the fall but states he cannot take the spasms anymore.

## 2022-11-13 NOTE — Discharge Instructions (Addendum)
Please contact your primary care to be seen in the next few days regarding recent ER visit. I will prescribe a limited dose of baclofen for 5 days for the muscle spasms but you will need to discuss with your PCP long term management. If symptoms worsen please return to ER.

## 2022-11-13 NOTE — ED Provider Triage Note (Signed)
Emergency Medicine Provider Triage Evaluation Note  Michael [name] , a 67 y.o. male  was evaluated in triage.  Pt complains of muscle spasms in L groin/hip flexor.   States he had a trip today and fell between the couch and table of his wall.  Hit his head.   No NV or fatigue.     Review of Systems  Positive: Muscle spasm Negative: CP  Physical Exam  BP (!) 159/96   Pulse 70   Temp 100 F (37.8 C)   Resp 20   SpO2 98%  Gen:   Awake, no distress   Resp:  Normal effort  MSK:   Moves extremities without difficulty  Other:  No TTP of long bones. In C-collar. No TL spine TTP. No upper extremity TTP. DP PT pulses 3+ symmetric, sensation in feet intact.   Medical Decision Making  Medically screening exam initiated at 6:14 AM.  Appropriate orders placed.  [name] was informed that the remainder of the evaluation will be completed by another provider, this initial triage assessment does not replace that evaluation, and the importance of remaining in the ED until their evaluation is complete.  Xray hip L  CT head/cspine   Tedd Sias, [place] 11/13/22 0932

## 2022-11-13 NOTE — ED Provider Notes (Signed)
Patient was seen in junction with PA Lurena Nida.  Early in the day, patient tripped on a rug and had a controlled fall onto the ground.  He did not hit his head nor endorse any loss of consciousness.  He does have history of muscle spasm for which she takes baclofen but no longer has the medication.  He attributed to the fall due to his neck spasm.  Patient otherwise denies any significant headache, chest pain, trouble breathing, abdominal pain, back pain, any focal numbness or focal weakness.  Workup today is overall reassuring.  Have interpreted labs and imaging and agree with radiology interpretation.  Electrolyte panels are reassuring, UA without signs of urinary tract infection, normal WBC, normal H&H, chest x-ray unremarkable, x-ray of the hip unremarkable.  Head CT scan ordered but patient declined stating that he does not have any significant head injury.  EKG shows several PVC evidence of bigeminy which is similar to prior EKG.  At this time I felt patient is stable to be discharged home as he is able to ambulate and vital signs stable.  Return precaution given.  Social determinant of health includes financial resource constraints, food insecurity and stressed.  This was taking consideration of his care.  BP 123/67   Pulse 62   Temp 98.6 F (37 C) (Oral)   Resp 17   SpO2 96%   Results for orders placed or performed during the hospital encounter of 11/13/22  Comprehensive metabolic panel  Result Value Ref Range   Sodium 141 135 - 145 mmol/L   Potassium 4.2 3.5 - 5.1 mmol/L   Chloride 107 98 - 111 mmol/L   CO2 21 (L) 22 - 32 mmol/L   Glucose, Bld 126 (H) 70 - 99 mg/dL   BUN 16 8 - 23 mg/dL   Creatinine, Ser 0.80 0.61 - 1.24 mg/dL   Calcium 9.3 8.9 - 10.3 mg/dL   Total Protein 7.5 6.5 - 8.1 g/dL   Albumin 3.6 3.5 - 5.0 g/dL   AST 24 15 - 41 U/L   ALT 15 0 - 44 U/L   Alkaline Phosphatase 74 38 - 126 U/L   Total Bilirubin 0.5 0.3 - 1.2 mg/dL   GFR, Estimated >60 >60 mL/min   Anion  gap 13 5 - 15  CBC  Result Value Ref Range   WBC 8.1 4.0 - 10.5 K/uL   RBC 6.69 (H) 4.22 - 5.81 MIL/uL   Hemoglobin 15.1 13.0 - 17.0 g/dL   HCT 45.2 39.0 - 52.0 %   MCV 67.6 (L) 80.0 - 100.0 fL   MCH 22.6 (L) 26.0 - 34.0 pg   MCHC 33.4 30.0 - 36.0 g/dL   RDW 17.2 (H) 11.5 - 15.5 %   Platelets 169 150 - 400 K/uL   nRBC 0.0 0.0 - 0.2 %  Magnesium  Result Value Ref Range   Magnesium 1.9 1.7 - 2.4 mg/dL  Urinalysis, Routine w reflex microscopic Urine, Clean Catch  Result Value Ref Range   Color, Urine YELLOW YELLOW   APPearance HAZY (A) CLEAR   Specific Gravity, Urine 1.024 1.005 - 1.030   pH 6.0 5.0 - 8.0   Glucose, UA NEGATIVE NEGATIVE mg/dL   Hgb urine dipstick NEGATIVE NEGATIVE   Bilirubin Urine NEGATIVE NEGATIVE   Ketones, ur NEGATIVE NEGATIVE mg/dL   Protein, ur 100 (A) NEGATIVE mg/dL   Nitrite NEGATIVE NEGATIVE   Leukocytes,Ua NEGATIVE NEGATIVE   RBC / HPF 6-10 0 - 5 RBC/hpf   WBC, UA  0-5 0 - 5 WBC/hpf   Bacteria, UA RARE (A) NONE SEEN   Squamous Epithelial / HPF 0-5 0 - 5 /HPF   Mucus PRESENT    DG Chest 2 View  Result Date: 11/13/2022 CLINICAL DATA:  Fever EXAM: CHEST - 2 VIEW COMPARISON:  Chest x-ray dated February 01, 2021 FINDINGS: The heart size and mediastinal contours are within normal limits. Both lungs are clear. The visualized skeletal structures are unremarkable. IMPRESSION: No active cardiopulmonary disease. Electronically Signed   By: [name] M.D.   On: 11/13/2022 12:03   DG Hip Unilat W or Wo Pelvis 2-3 Views Left  Result Date: 11/13/2022 CLINICAL DATA:  Fall with left hip pain EXAM: DG HIP (WITH OR WITHOUT PELVIS) 3V LEFT COMPARISON:  02/01/2021 FINDINGS: No hip fracture or dislocation. No evidence of pelvic ring fracture or diastasis. Mild joint space narrowing and spurring at the left hip. IMPRESSION: No acute finding. Electronically Signed   By: [name] M.D.   On: 11/13/2022 07:09   DG ELBOW COMPLETE LEFT (3+VIEW)  Result Date:  11/11/2022 CLINICAL DATA:  Pain. EXAM: LEFT ELBOW - COMPLETE 4 VIEW COMPARISON:  None Available. FINDINGS: No osteolytic or osteoblastic changes. No fracture, dislocation or subluxation identified. There are positive anterior and posterior fat pad signs. Which may indicate the presence of a radiographically occult injury. Consider further evaluation with CT. IMPRESSION: Joint effusion with displaced fat pads. No fracture identified. Consider CT to assess for radiographically occult injury, if indicated. Electronically Signed   By: Sammie Bench M.D.   On: 11/11/2022 16:16   DG MYELOGRAPHY LUMBAR INJ LUMBOSACRAL  Result Date: 11/02/2022 CLINICAL DATA:  Worsening left-greater-than-right groin and lateral leg pain since October. Significant left leg weakness. Remote history of prior L5-S1 hemilaminectomy. EXAM: LUMBAR MYELOGRAM CT LUMBAR MYELOGRAM FLUOROSCOPY: Radiation Exposure Index (as provided by the fluoroscopic device): 15.9 mGy Kerma PROCEDURE: After thorough discussion of risks and benefits of the procedure including bleeding, infection, injury to nerves, blood vessels, adjacent structures as well as headache and CSF leak, written and oral informed consent was obtained. Consent was obtained by Dr. Fabiola Backer. Time out form was completed. Patient was positioned prone on the fluoroscopy table. Local anesthesia was provided with 1% lidocaine without epinephrine after prepped and draped in the usual sterile fashion. Puncture was performed at L3-L4 using a 3 1/2 inch 22-gauge spinal needle via left interlaminar approach. Using a single pass through the dura, the needle was placed within the thecal sac, with return of clear CSF. 15 mL of Isovue M-200 was injected into the thecal sac, with normal opacification of the nerve roots and cauda equina consistent with free flow within the subarachnoid space. I personally performed the lumbar puncture and administered the intrathecal contrast. I also personally  supervised acquisition of the myelogram images. TECHNIQUE: Contiguous axial images were obtained through the lumbar spine after the intrathecal infusion of contrast. Coronal and sagittal reconstructions were obtained of the axial image sets. COMPARISON:  MRI lumbar spine dated December 29, 2021. FINDINGS: LUMBAR MYELOGRAM FINDINGS: Unchanged trace retrolisthesis at T12-L1 and L1-L2. No dynamic instability. Small ventral extradural defects from T12-L1 through L4-L5. No spinal canal stenosis or nerve root effacement. CT LUMBAR MYELOGRAM FINDINGS: Segmentation: Standard. Alignment: Unchanged mild levocurvature. Unchanged trace retrolisthesis at T12-L1 and L1-L2. Vertebrae: No acute fracture or other focal pathologic process. Small L3 bone island. Conus medullaris and cauda equina: Conus extends to the T12-L1 level. Conus and cauda equina appear normal. Paraspinal and other soft  tissues: Aortoiliac atherosclerotic vascular disease. Disc levels: T12-L1: Unchanged small broad-based posterior disc osteophyte complex and mild left facet arthropathy. No stenosis. L1-L2: Unchanged small shallow right paracentral disc osteophyte complex and mild bilateral facet arthropathy. No stenosis. L2-L3: Unchanged small broad-based posterior disc osteophyte complex and mild bilateral facet arthropathy. Unchanged mild right neuroforaminal stenosis. No spinal canal or left neuroforaminal stenosis. L3-L4: Unchanged small broad-based posterior disc osteophyte complex and bilateral facet arthropathy. Unchanged mild bilateral neuroforaminal stenosis. No spinal canal stenosis. L4-L5: Unchanged small broad-based posterior disc osteophyte complex and mild bilateral facet arthropathy. No stenosis. L5-S1: Prior left hemilaminectomy. Unchanged tiny broad-based posterior disc protrusion and mild-to-moderate bilateral facet arthropathy. No stenosis. IMPRESSION: 1. Unchanged mild multilevel lumbar spondylosis as described above. No high-grade stenosis or  impingement. 2. Unchanged trace retrolisthesis at T12-L1 and L1-L2. No dynamic instability. 3.  Aortic Atherosclerosis (ICD10-I70.0). Electronically Signed   By: Titus Dubin M.D.   On: 11/02/2022 11:09   CT LUMBAR SPINE W CONTRAST  Result Date: 11/02/2022 CLINICAL DATA:  Worsening left-greater-than-right groin and lateral leg pain since October. Significant left leg weakness. Remote history of prior L5-S1 hemilaminectomy. EXAM: LUMBAR MYELOGRAM CT LUMBAR MYELOGRAM FLUOROSCOPY: Radiation Exposure Index (as provided by the fluoroscopic device): 15.9 mGy Kerma PROCEDURE: After thorough discussion of risks and benefits of the procedure including bleeding, infection, injury to nerves, blood vessels, adjacent structures as well as headache and CSF leak, written and oral informed consent was obtained. Consent was obtained by Dr. Fabiola Backer. Time out form was completed. Patient was positioned prone on the fluoroscopy table. Local anesthesia was provided with 1% lidocaine without epinephrine after prepped and draped in the usual sterile fashion. Puncture was performed at L3-L4 using a 3 1/2 inch 22-gauge spinal needle via left interlaminar approach. Using a single pass through the dura, the needle was placed within the thecal sac, with return of clear CSF. 15 mL of Isovue M-200 was injected into the thecal sac, with normal opacification of the nerve roots and cauda equina consistent with free flow within the subarachnoid space. I personally performed the lumbar puncture and administered the intrathecal contrast. I also personally supervised acquisition of the myelogram images. TECHNIQUE: Contiguous axial images were obtained through the lumbar spine after the intrathecal infusion of contrast. Coronal and sagittal reconstructions were obtained of the axial image sets. COMPARISON:  MRI lumbar spine dated December 29, 2021. FINDINGS: LUMBAR MYELOGRAM FINDINGS: Unchanged trace retrolisthesis at T12-L1 and L1-L2. No dynamic  instability. Small ventral extradural defects from T12-L1 through L4-L5. No spinal canal stenosis or nerve root effacement. CT LUMBAR MYELOGRAM FINDINGS: Segmentation: Standard. Alignment: Unchanged mild levocurvature. Unchanged trace retrolisthesis at T12-L1 and L1-L2. Vertebrae: No acute fracture or other focal pathologic process. Small L3 bone island. Conus medullaris and cauda equina: Conus extends to the T12-L1 level. Conus and cauda equina appear normal. Paraspinal and other soft tissues: Aortoiliac atherosclerotic vascular disease. Disc levels: T12-L1: Unchanged small broad-based posterior disc osteophyte complex and mild left facet arthropathy. No stenosis. L1-L2: Unchanged small shallow right paracentral disc osteophyte complex and mild bilateral facet arthropathy. No stenosis. L2-L3: Unchanged small broad-based posterior disc osteophyte complex and mild bilateral facet arthropathy. Unchanged mild right neuroforaminal stenosis. No spinal canal or left neuroforaminal stenosis. L3-L4: Unchanged small broad-based posterior disc osteophyte complex and bilateral facet arthropathy. Unchanged mild bilateral neuroforaminal stenosis. No spinal canal stenosis. L4-L5: Unchanged small broad-based posterior disc osteophyte complex and mild bilateral facet arthropathy. No stenosis. L5-S1: Prior left hemilaminectomy. Unchanged tiny broad-based posterior disc protrusion  and mild-to-moderate bilateral facet arthropathy. No stenosis. IMPRESSION: 1. Unchanged mild multilevel lumbar spondylosis as described above. No high-grade stenosis or impingement. 2. Unchanged trace retrolisthesis at T12-L1 and L1-L2. No dynamic instability. 3.  Aortic Atherosclerosis (ICD10-I70.0). Electronically Signed   By: Titus Dubin M.D.   On: 11/02/2022 11:09      Domenic Moras, PA-C 11/13/22 1237    Malvin Johns, MD 11/13/22 1540

## 2022-11-13 NOTE — ED Provider Notes (Signed)
[facility] EMERGENCY DEPARTMENT Provider Note   CSN: [id number] Arrival date & time: 11/13/22  0549     History  Chief Complaint  Patient presents with   Michael [name] Michael [name] is a 67 y.o. male presented with a fall.  Patient stated he slipped on a rug getting up from the couch.  Patient states that he has leg spasms after having lumbar disc surgery back in 2014 that he takes baclofen for.  Patient denied any chest pain, shortness of breath, presyncopal symptoms before fall.  Patient states he fell backwards and hit his head.  Patient denied being on any blood thinners.  Patient states he has chronic back pain and is chronically limited in AROM in the hips due to the leg spams. Leg spasms are 6/10 constant shooting pain that patient takes baclofen for daily.  Patient denied chest pain, shortness of breath, abd pain, changes in sensation/motor skills, dysuria, recent illnesses, headache, back pain, loss of consciousness    Home Medications Prior to Admission medications   Medication Sig Start Date End Date Taking? Authorizing Provider  atorvastatin (LIPITOR) 40 MG tablet Take 1 tablet (40 mg total) by mouth daily. 09/23/22   [name], MD  Baclofen 5 MG TABS Take 5 mg by mouth at bedtime. 11/13/22   Chuck Hint, PA-C  gabapentin (NEURONTIN) 300 MG capsule Take 1 capsule (300 mg total) by mouth 3 (three) times daily. 05/16/22   [name], DO  GARLIC PO Take by mouth daily.    [provider]  hydrALAZINE (APRESOLINE) 25 MG tablet Take 1 tablet (25 mg total) by mouth 3 (three) times daily. 09/18/22   [name], MD  triamterene-hydrochlorothiazide (DYAZIDE) 37.5-25 MG capsule Take 1 each (1 capsule total) by mouth daily. 09/18/22   [name], MD      Allergies    Patient has no known allergies.    Review of Systems   Review of Systems See HPI Physical Exam Updated Vital Signs BP 123/67   Pulse 62   Temp 98.6 F (37 C)  (Oral)   Resp 17   SpO2 96%  Physical Exam Vitals and nursing note reviewed.  Constitutional:      General: He is not in acute distress.    Appearance: He is well-developed.  HENT:     Head: Normocephalic and atraumatic.     Nose: Nose normal.     Mouth/Throat:     Mouth: Mucous membranes are moist.  Eyes:     Extraocular Movements: Extraocular movements intact.     Conjunctiva/sclera: Conjunctivae normal.     Pupils: Pupils are equal, round, and reactive to light.  Neck:     Comments: No tenderness to palpation of cervical spine Full range of motion Cardiovascular:     Rate and Rhythm: Normal rate and regular rhythm.     Pulses: Normal pulses.     Heart sounds: Normal heart sounds. No murmur heard.    Comments: 2+ bilateral radial/posterior tibialis/dorsalis pedis pulses with normal rate Pulmonary:     Effort: Pulmonary effort is normal. No respiratory distress.     Breath sounds: Normal breath sounds.  Abdominal:     Palpations: Abdomen is soft.     Tenderness: There is no abdominal tenderness.  Musculoskeletal:     Cervical back: Normal range of motion.     Comments: Patient has limited range of motion in bilateral hips Patient is able to bear  weight however he does need to hold onto a railing for support Patient is unwilling to range his hips or knees due to chronic pain Patient is able to wiggle his toes and dorsiflex/plantarflex bilateral ankles with 5/5 strength  Skin:    General: Skin is warm and dry.     Capillary Refill: Capillary refill takes less than 2 seconds.  Neurological:     General: No focal deficit present.     Mental Status: He is alert and oriented to person, place, and time.     Cranial Nerves: Cranial nerves 2-12 are intact.     Sensory: Sensation is intact.     Comments: Sensation intact distally in his feet Finger-to-nose test: Unremarkable Patient is unable to perform heel-to-toe and Romberg test due to chronic hip pain Patient is able to  bear weight and take very small steps however he also needs to hold onto a railing for support which he says is baseline  Psychiatric:        Mood and Affect: Mood normal.     ED Results / Procedures / Treatments   Labs (all labs ordered are listed, but only abnormal results are displayed) Labs Reviewed  COMPREHENSIVE METABOLIC PANEL - Abnormal; Notable for the following components:      Result Value   CO2 21 (*)    Glucose, Bld 126 (*)    All other components within normal limits  CBC - Abnormal; Notable for the following components:   RBC 6.69 (*)    MCV 67.6 (*)    MCH 22.6 (*)    RDW 17.2 (*)    All other components within normal limits  URINALYSIS, ROUTINE W REFLEX MICROSCOPIC - Abnormal; Notable for the following components:   APPearance HAZY (*)    Protein, ur 100 (*)    Bacteria, UA RARE (*)    All other components within normal limits  MAGNESIUM    EKG None  Radiology DG Chest 2 View  Result Date: 11/13/2022 CLINICAL DATA:  Fever EXAM: CHEST - 2 VIEW COMPARISON:  Chest x-ray dated February 01, 2021 FINDINGS: The heart size and mediastinal contours are within normal limits. Both lungs are clear. The visualized skeletal structures are unremarkable. IMPRESSION: No active cardiopulmonary disease. Electronically Signed   By: [name] M.D.   On: 11/13/2022 12:03   DG Hip Unilat W or Wo Pelvis 2-3 Views Left  Result Date: 11/13/2022 CLINICAL DATA:  Fall with left hip pain EXAM: DG HIP (WITH OR WITHOUT PELVIS) 3V LEFT COMPARISON:  02/01/2021 FINDINGS: No hip fracture or dislocation. No evidence of pelvic ring fracture or diastasis. Mild joint space narrowing and spurring at the left hip. IMPRESSION: No acute finding. Electronically Signed   By: [name] M.D.   On: 11/13/2022 07:09    Procedures Procedures    Medications Ordered in ED Medications  acetaminophen (TYLENOL) tablet 1,000 mg (1,000 mg Oral Given 11/13/22 0925)  LORazepam (ATIVAN) tablet 1 mg (1 mg  Oral Given 11/13/22 7062)    ED Course/ Medical Decision Making/ A&P                             Medical Decision Making  Michael [name] 67 y.o. presented today for all. Working DDx that I considered at this time includes, but not limited to CVA/TIA, mechanical fall, ACS, basilar skull fracture, spinal fracture, UTI.  Review of prior external notes: 11/09/22 Hulan Saas, DO  Unique Tests and My Interpretation:  Medium: Unremarkable CMP: Unremarkable CBC: unremarkable EKG: Sinus with PVCs, similar to previous EKGs UA: Unremarkable CT head wo con: Canceled due to patient's informed request  Discussion with Independent Historian: None  Discussion of Management of Tests: None  Risk:   Medium:  - prescription drug management  Risk Stratification Score: none  Staffed with Domenic Moras, PA-C  R/o DDx: CVA/TIA: Patient for forewent CT scan after being informed of the risks and with a negative neuroexam I have a low suspicion this is the cause ACS: patient denied CP, no h/o ACS, EKG was reassuring Spinal Frx: no step off palpated on spine and patient is able to range back without complication PNA: Chest x-ray negative UTI: Urine was unremarkable   Plan: Patient fell this morning after slipping on the rug.  Patient was given 200 mcg of fentanyl by EMS due to spasms. Patient's initial Bp was 220/110 but has resolved after receiving fentanyl. I suspect the high BP was due to the muscle spasm pain as his neuro exam is negative and there are no signs of end organ damage due to high BP. On exam patient's appears alert and oriented x 3 and has no focal neurodeficit.  Patient does appear weak in the hips and keeps his hips in flexion to alleviate the spasms.  Patient was given 1000 mg of Tylenol and 1 mg of Ativan in ED for symptom relief.  I also put some blankets under his knees as to keep his hips flexed as he stated that this reduced the spasm pain.  On exam I did not palpate any  step-offs and there was no tenderness to palpation on the spine or throughout the body.  I did not note any signs of trauma such as ecchymosis around the eyes or postauricular.  Due to the patient's presentation I have a low suspicion of any life-threatening diagnoses and that this morning was more of a mechanical fall however we will still assess for stroke and get a Noncon head CT as this will also show if there are any skull fractures that may not have been palpable.  C-collar was removed after patient demonstrated that he could range his neck without pain and there was no spinal tenderness when I palpated.  At this time we will not CT the patient's neck as the patient is able to range his neck, there is no tenderness to palpation of the cervical spine, and the patient does not complain of any neck pain or trouble swallowing or neuro deficits. Patient's vitals at this time are reassuring as they are similar to previous readings and patient is asymptomatic for new symptoms.  At this time I suspect this is most likely a mechanical fall due to the rug slipping underneath the patient.  On recheck patient stated that he has not been taking his gabapentin or tizanidine in the past 2 days due to reported rash on bilateral hands and I suspect that is why he is in so much pain at the moment.  Patient states he has not been taking it due to a rash that is formed on his hands however I do not note any rash on exam.  Patient stated that he does not think he needs a CT scan of his head as he "had a soft fall."  I informed the patient of the risks of possibly missing a CVA/TIA, skull fracture, intracranial hemorrhage/hematoma and patient verbalized he understood the risks and want to forego the  CT scan.  After speaking with the patient and patient's he is able to bear weight and walk and feel safe to go home with primary care follow-up.  Patient is able to stand with and take shortened gait but is hunched forward.  To  recheck vitals patient has a temperature of 100 F so we will obtain a chest x-ray to ensure we are not missing any respiratory illnesses.  Patient will be prescribed 5-day of baclofen for symptom management until he is able to see his primary care as patient stated he is out of baclofen at home.  Patient's vitals are stable and patient was agreeable to discharge with primary care follow-up.  Patient verbalized agreement to this plan        Final Clinical Impression(s) / ED Diagnoses Final diagnoses:  Fall, initial encounter  Muscle spasms of both lower extremities    Rx / DC Orders ED Discharge Orders          Ordered    Baclofen 5 MG TABS  Daily at bedtime        11/13/22 1131              Elvina Sidle 11/13/22 1238    Malvin Johns, MD 11/13/22 1541

## 2022-11-15 ENCOUNTER — Ambulatory Visit: Payer: Federal, State, Local not specified - PPO | Admitting: Internal Medicine

## 2022-11-15 ENCOUNTER — Telehealth: Payer: Self-pay | Admitting: Internal Medicine

## 2022-11-15 ENCOUNTER — Encounter: Payer: Self-pay | Admitting: Internal Medicine

## 2022-11-15 VITALS — BP 132/78 | HR 72 | Temp 98.1°F | Resp 16 | Ht 75.0 in | Wt 250.0 lb

## 2022-11-15 DIAGNOSIS — Z23 Encounter for immunization: Secondary | ICD-10-CM | POA: Diagnosis not present

## 2022-11-15 DIAGNOSIS — R972 Elevated prostate specific antigen [PSA]: Secondary | ICD-10-CM

## 2022-11-15 DIAGNOSIS — E785 Hyperlipidemia, unspecified: Secondary | ICD-10-CM

## 2022-11-15 DIAGNOSIS — F444 Conversion disorder with motor symptom or deficit: Secondary | ICD-10-CM

## 2022-11-15 DIAGNOSIS — R739 Hyperglycemia, unspecified: Secondary | ICD-10-CM

## 2022-11-15 DIAGNOSIS — I10 Essential (primary) hypertension: Secondary | ICD-10-CM

## 2022-11-15 NOTE — Progress Notes (Signed)
Subjective:  Patient ID: Michael [name], male    DOB: 07/06/1956  Age: 67 y.o. MRN: [id number]  CC: Hypertension and Osteoarthritis   HPI Michael [name] presents for f/up -   He was recently seen in the ED for musculoskeletal pain and spasms.  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  He drove himself here and walked up to the second floor but when he got into the office he said that he had trouble getting on the table and then subsequently took himself to the ground and ended up in a prone position.  He tells me that excitement causes musculoskeletal pain, spasms, and a shockwave sensation in his left lower extremity.  He had a procedure done by a spine specialist about 3 months ago and he said it did not go well.  He is also followed by sports medicine.  He is active and denies chest pain, shortness of breath, diaphoresis, or edema.  Outpatient Medications Prior to Visit  Medication Sig Dispense Refill   atorvastatin (LIPITOR) 40 MG tablet Take 1 tablet (40 mg total) by mouth daily. 90 tablet 1   Baclofen 5 MG TABS Take 5 mg by mouth at bedtime. 5 tablet 0   gabapentin (NEURONTIN) 300 MG capsule Take 1 capsule (300 mg total) by mouth 3 (three) times daily. 389 capsule 1   GARLIC PO Take by mouth daily.     hydrALAZINE (APRESOLINE) 25 MG tablet Take 1 tablet (25 mg total) by mouth 3 (three) times daily. 270 tablet 0   triamterene-hydrochlorothiazide (DYAZIDE) 37.5-25 MG capsule Take 1 each (1 capsule total) by mouth daily. 90 capsule 0   No facility-administered medications prior to visit.    ROS Review of Systems  Constitutional:  Negative for chills, diaphoresis, fatigue and fever.  HENT: Negative.    Eyes: Negative.   Respiratory:  Negative for cough, chest tightness, shortness of breath and wheezing.   Cardiovascular:  Negative for chest pain, palpitations and leg swelling.  Gastrointestinal:  Negative for abdominal pain, diarrhea, nausea and vomiting.  Endocrine:  Negative.   Genitourinary: Negative.  Negative for difficulty urinating.  Musculoskeletal:  Positive for gait problem and myalgias. Negative for arthralgias.  Skin: Negative.   Neurological:  Negative for dizziness and weakness.  Hematological:  Negative for adenopathy. Does not bruise/bleed easily.  Psychiatric/Behavioral: Negative.      Objective:  BP 132/78   Pulse 72   Temp 98.1 F (36.7 C) (Oral)   Resp 16   Ht '6\' 3"'$  (1.905 m)   Wt 250 lb (113.4 kg)   SpO2 96%   BMI 31.25 kg/m   BP Readings from Last 3 Encounters:  11/15/22 132/78  11/13/22 123/67  11/09/22 130/70    Wt Readings from Last 3 Encounters:  11/15/22 250 lb (113.4 kg)  11/09/22 246 lb (111.6 kg)  09/17/22 246 lb (111.6 kg)    Physical Exam Vitals reviewed.  HENT:     Mouth/Throat:     Mouth: Mucous membranes are moist.  Eyes:     General: No scleral icterus.    Conjunctiva/sclera: Conjunctivae normal.  Cardiovascular:     Rate and Rhythm: Normal rate and regular rhythm.     Heart sounds: No murmur heard. Pulmonary:     Effort: Pulmonary effort is normal.     Breath sounds: No stridor. No wheezing, rhonchi or rales.  Abdominal:     General: Abdomen is flat.     Palpations: There is no mass.  Tenderness: There is no abdominal tenderness. There is no guarding.     Hernia: No hernia is present.  Musculoskeletal:        General: No swelling, tenderness or deformity.     Cervical back: Neck supple.     Comments: He was on the floor for most of the office visit.  He took himself into a prone position and then rolled over into a supine position and said he was not able to get up.  We did not have the staff to lift him so I called EMS.  They helped [place] lift him and get him into a wheelchair.  He refused transport with EMS.  He was safely taken to his car.  Lymphadenopathy:     Cervical: No cervical adenopathy.  Skin:    General: Skin is warm.  Neurological:     General: No focal deficit present.      Mental Status: He is alert.  Psychiatric:        Attention and Perception: He is inattentive.        Mood and Affect: Mood normal.        Speech: Speech is tangential.        Behavior: Behavior is uncooperative.        Thought Content: Thought content is delusional. Thought content is not paranoid. Thought content does not include homicidal or suicidal ideation.        Cognition and Memory: Cognition normal.        Judgment: Judgment is inappropriate.     Lab Results  Component Value Date   WBC 8.1 11/13/2022   HGB 15.1 11/13/2022   HCT 45.2 11/13/2022   PLT 169 11/13/2022   GLUCOSE 126 (H) 11/13/2022   CHOL 164 02/01/2021   TRIG 39.0 02/01/2021   HDL 49.60 02/01/2021   LDLCALC 106 (H) 02/01/2021   ALT 15 11/13/2022   AST 24 11/13/2022   NA 141 11/13/2022   K 4.2 11/13/2022   CL 107 11/13/2022   CREATININE 0.80 11/13/2022   BUN 16 11/13/2022   CO2 21 (L) 11/13/2022   TSH 4.45 04/05/2022   PSA 4.6 09/17/2022    DG Chest 2 View  Result Date: 11/13/2022 CLINICAL DATA:  Fever EXAM: CHEST - 2 VIEW COMPARISON:  Chest x-ray dated February 01, 2021 FINDINGS: The heart size and mediastinal contours are within normal limits. Both lungs are clear. The visualized skeletal structures are unremarkable. IMPRESSION: No active cardiopulmonary disease. Electronically Signed   By: [name] M.D.   On: 11/13/2022 12:03   DG Hip Unilat W or Wo Pelvis 2-3 Views Left  Result Date: 11/13/2022 CLINICAL DATA:  Fall with left hip pain EXAM: DG HIP (WITH OR WITHOUT PELVIS) 3V LEFT COMPARISON:  02/01/2021 FINDINGS: No hip fracture or dislocation. No evidence of pelvic ring fracture or diastasis. Mild joint space narrowing and spurring at the left hip. IMPRESSION: No acute finding. Electronically Signed   By: [name] M.D.   On: 11/13/2022 07:09    Assessment & Plan:   Diagnoses and all orders for this visit:  Conversion disorder with abnormal movement- He lacks awareness about his  condition and what he is doing to himself.  I asked him if he wanted to get better from this and he said no.  Accelerated hypertension- His blood pressure is adequately well-controlled. -     TSH; Future  PSA elevation -     PSA; Future  Dyslipidemia, goal LDL below 70 -  Lipid panel; Future  Chronic hyperglycemia -     Hemoglobin A1c; Future  Other orders -     Flu Vaccine QUAD 6+ mos PF IM (Fluarix Quad PF)   I am having Keyontae B. Prewitt maintain his GARLIC PO, gabapentin, hydrALAZINE, triamterene-hydrochlorothiazide, atorvastatin, and Baclofen.  No orders of the defined types were placed in this encounter.    Follow-up: No follow-ups on file.  Scarlette Calico, MD

## 2022-11-15 NOTE — Telephone Encounter (Signed)
Patient called and said he had labs done 11/13/22 and said he is confused by some of the information in there and would like a call back with results

## 2022-11-15 NOTE — Telephone Encounter (Signed)
Patient called back and was advised to schedule follow up. Scheduled today at 1pm

## 2022-11-16 ENCOUNTER — Encounter: Payer: Self-pay | Admitting: Internal Medicine

## 2022-11-16 DIAGNOSIS — R739 Hyperglycemia, unspecified: Secondary | ICD-10-CM | POA: Insufficient documentation

## 2022-11-19 ENCOUNTER — Telehealth: Payer: Self-pay

## 2022-11-19 NOTE — Telephone Encounter (Signed)
Called pt, LVM informing him of labs needed.

## 2022-11-19 NOTE — Telephone Encounter (Signed)
-----  Message from [name], MD sent at 11/16/2022 10:57 AM EST ----- Regarding: labs Ask him to come back to the lab to recheck his blood sugar and PSA  Michael [name]

## 2022-11-20 ENCOUNTER — Telehealth: Payer: Self-pay | Admitting: *Deleted

## 2022-11-20 NOTE — Patient Outreach (Signed)
  Care Coordination [facility] Note Transition Care Management Follow-up Telephone Call Date of discharge and from where: [id number] ED EMMI How have you been since you were released from the hospital? I am still having spasms. I am now a 4/10 instead of a 10/10.  The medication is helping Any questions or concerns? Yes Patient had questions about results and what they meant from the ed.  CXR Normal,  Hip neg.   Items Reviewed: Did the pt receive and understand the discharge instructions provided? Yes  Medications obtained and verified? Yes  Other? No  Any new allergies since your discharge? No  Dietary orders reviewed? No Do you have support at home? Yes   Home Care and Equipment/Supplies: Were home health services ordered? no If so, what is the name of the agency? N   Has the agency set up a time to come to the patient's home? no Were any new equipment or medical supplies ordered?  No What is the name of the medical supply agency? N/a Were you able to get the supplies/equipment? not applicable Do you have any questions related to the use of the equipment or supplies? No  Functional Questionnaire: (I = Independent and D = Dependent) ADLs: I  Bathing/Dressing- I  Meal Prep- I  Eating- I  Maintaining continence- I  Transferring/Ambulation- I  Managing Meds- I  Follow up appointments reviewed:  PCP Hospital f/u appt confirmed? Yes  Scheduled to see Dr Ronnald Ramp [phone]  on 1:00 [place] [facility] f/u appt confirmed? No   Are transportation arrangements needed? No  If their condition worsens, is the pt aware to call PCP or go to the Emergency Dept.? Yes Was the patient provided with contact information for the PCP's office or ED? Yes Was to pt encouraged to call back with questions or concerns? Yes  SDOH assessments and interventions completed:   No   Care Coordination Interventions:  No Care Coordination interventions needed at this time.   Encounter Outcome:  Pt. Visit  Completed   [place] [facility] [phone]

## 2022-11-22 ENCOUNTER — Other Ambulatory Visit: Payer: Self-pay | Admitting: Internal Medicine

## 2022-11-22 ENCOUNTER — Other Ambulatory Visit (INDEPENDENT_AMBULATORY_CARE_PROVIDER_SITE_OTHER): Payer: Federal, State, Local not specified - PPO

## 2022-11-22 DIAGNOSIS — R972 Elevated prostate specific antigen [PSA]: Secondary | ICD-10-CM

## 2022-11-22 DIAGNOSIS — R739 Hyperglycemia, unspecified: Secondary | ICD-10-CM

## 2022-11-22 DIAGNOSIS — I10 Essential (primary) hypertension: Secondary | ICD-10-CM

## 2022-11-22 DIAGNOSIS — E785 Hyperlipidemia, unspecified: Secondary | ICD-10-CM

## 2022-11-22 LAB — LIPID PANEL
Cholesterol: 177 mg/dL (ref 0–200)
HDL: 49.1 mg/dL (ref >39.00)
LDL Cholesterol: 116 mg/dL — ABNORMAL HIGH (ref 0–99)
NonHDL: 127.82
Total CHOL/HDL Ratio: 4
Triglycerides: 59 mg/dL (ref 0.0–149.0)
VLDL: 11.8 mg/dL (ref 0.0–40.0)

## 2022-11-22 LAB — HEMOGLOBIN A1C: Hgb A1c MFr Bld: 5.9 % (ref 4.6–6.5)

## 2022-11-22 LAB — PSA: PSA: 10.42 ng/mL — ABNORMAL HIGH (ref 0.10–4.00)

## 2022-11-22 LAB — TSH: TSH: 5.02 u[IU]/mL (ref 0.35–5.50)

## 2022-11-23 ENCOUNTER — Encounter: Payer: Self-pay | Admitting: Internal Medicine

## 2022-11-27 ENCOUNTER — Other Ambulatory Visit: Payer: Self-pay | Admitting: Internal Medicine

## 2022-11-28 ENCOUNTER — Other Ambulatory Visit: Payer: Self-pay | Admitting: Internal Medicine

## 2022-11-28 DIAGNOSIS — R972 Elevated prostate specific antigen [PSA]: Secondary | ICD-10-CM

## 2022-11-29 DIAGNOSIS — M25552 Pain in left hip: Secondary | ICD-10-CM | POA: Diagnosis not present

## 2022-11-29 DIAGNOSIS — M961 Postlaminectomy syndrome, not elsewhere classified: Secondary | ICD-10-CM | POA: Diagnosis not present

## 2022-11-29 DIAGNOSIS — M5416 Radiculopathy, lumbar region: Secondary | ICD-10-CM | POA: Diagnosis not present

## 2022-11-29 DIAGNOSIS — Z6831 Body mass index (BMI) 31.0-31.9, adult: Secondary | ICD-10-CM | POA: Diagnosis not present

## 2022-12-05 ENCOUNTER — Encounter: Payer: Self-pay | Admitting: Urology

## 2022-12-05 ENCOUNTER — Ambulatory Visit (INDEPENDENT_AMBULATORY_CARE_PROVIDER_SITE_OTHER): Payer: Medicare Other | Admitting: Urology

## 2022-12-05 VITALS — BP 172/72 | HR 32 | Ht 76.0 in | Wt 237.0 lb

## 2022-12-05 DIAGNOSIS — M545 Low back pain, unspecified: Secondary | ICD-10-CM

## 2022-12-05 DIAGNOSIS — M79604 Pain in right leg: Secondary | ICD-10-CM

## 2022-12-05 DIAGNOSIS — M79605 Pain in left leg: Secondary | ICD-10-CM

## 2022-12-05 DIAGNOSIS — C61 Malignant neoplasm of prostate: Secondary | ICD-10-CM

## 2022-12-05 LAB — URINALYSIS
Glucose, UA: NEGATIVE mg/dL
Ketones, UA: NEGATIVE
Nitrite, UA: NEGATIVE
Protein, UA: POSITIVE — AB
Spec Grav, UA: >=1.03 — AB (ref 1.010–1.025)
Urobilinogen, UA: 0.2 E.U./dL
pH, UA: 5.5 (ref 5.0–8.0)

## 2022-12-05 NOTE — Progress Notes (Signed)
Assessment: 1. Prostate cancer ([place]); T1c; GG 1; initial diagnosis 2011   2. Low back pain radiating to both legs    Plan: I personally reviewed the patient's chart including provider notes, and lab results. I I personally reviewed records from [facility] including notes from Dr. Janice Norrie and Dr. Lovena Neighbours. I discussed the recommendations for active surveillance for management of low risk prostate cancer.  Unfortunately, Mr. Alewine has not followed through with appropriate follow-up for his prostate cancer.  His desire to avoid another prostate biopsy is the reason for his failure to follow-up. I recommended further evaluation with a prostate MRI. Scheduled for 3T multiparametric MRI of the prostate.  Will contact him with results. I advised him that his low back and bilateral leg pain is likely musculoskeletal and is in no way related to his prostate or urinary tract.   Chief Complaint:  Chief Complaint  Patient presents with   back and leg pain    History of Present Illness:  Michael [name] is a 67 y.o. male who is seen in consultation from [name], MD for evaluation of prostate cancer and history of gross hematuria.  He has a history of low risk prostate cancer which was initially diagnosed by Dr. Janice Norrie in November 2011.  His PSA at that time was 3.46.  Prostate biopsy showed Gleason 6 adenocarcinoma in 10% of 1 core from the left apex, high-grade PIN in the left apex and right base.  He has been managed with active surveillance.  He has not had any further biopsies performed despite recommendations from Dr. Janice Norrie and Dr. Lovena Neighbours.  He was last seen by Dr. Lovena Neighbours in 2021. PSA results: 3/12 8.49 1/21 8.20 4/22 5.4 9/22 4.66 6/23 7.5 11/23 4.6 1/24 10.4  He has a family history of prostate cancer with his father.  He has a prior history of gross hematuria in February 2021.  Further evaluation with CT and cystoscopy was recommended but the patient never followed up for  the studies.  He notes today for evaluation.  His primary complaint today is pain in his low back and upper thigh area making sitting, standing, and lying down difficult.  He also has problems with ambulation.  He reports the symptoms have been present for several years.  He spent the majority of the visit telling me about his back and leg issues.  He is not having any significant lower urinary tract symptoms.  He does report some frequency and urgency.  He has nocturia x 2.  No dysuria or gross hematuria. IPSS = 9 today. He denies any weight loss or new bone pain.   Past Medical History:  Past Medical History:  Diagnosis Date   Hyperlipidemia    Hypertension    Lumbar disc disease    Prostate cancer ([place])     Past Surgical History:  Past Surgical History:  Procedure Laterality Date   LUMBAR DISC SURGERY  2014   LUMBAR [place]     right ankle fracture  1978   external pinning    Allergies:  No Known Allergies  Family History:  Family History  Problem Relation Age of Onset   High blood pressure Mother    Prostate cancer Father     Social History:  Social History   Tobacco Use   Smoking status: Never    Passive exposure: Never   Smokeless tobacco: Never  Vaping Use   Vaping Use: Never used  Substance Use Topics  Alcohol use: Not Currently    Comment: rare   Drug use: No    Review of symptoms:  Constitutional:  Negative for unexplained weight loss, night sweats, fever, chills ENT:  Negative for nose bleeds, sinus pain, painful swallowing CV:  Negative for chest pain, shortness of breath, exercise intolerance, palpitations, loss of consciousness Resp:  Negative for cough, wheezing, shortness of breath GI:  Negative for nausea, vomiting, diarrhea, bloody stools GU:  Positives noted in HPI; otherwise negative for gross hematuria, dysuria, urinary incontinence Neuro:  Negative for seizures, poor balance, limb weakness, slurred speech Psych:  Negative for lack  of energy, depression, anxiety Endocrine:  Negative for polydipsia, polyuria, symptoms of hypoglycemia (dizziness, hunger, sweating) Hematologic:  Negative for anemia, purpura, petechia, prolonged or excessive bleeding, use of anticoagulants  Allergic:  Negative for difficulty breathing or choking as a result of exposure to anything; no shellfish allergy; no allergic response (rash/itch) to materials, foods  Physical exam: BP (!) 172/72   Pulse (!) 32   Ht '6\' 4"'$  (1.93 m)   Wt 237 lb (107.5 kg)   BMI 28.85 kg/m  GENERAL APPEARANCE:  Well appearing, well developed, well nourished, NAD HEENT: Atraumatic, Normocephalic, oropharynx clear. NECK: Supple without lymphadenopathy or thyromegaly. LUNGS: Clear to auscultation bilaterally. HEART: Regular Rate and Rhythm without murmurs, gallops, or rubs. ABDOMEN: Soft, non-tender, No Masses. EXTREMITIES: Moves all extremities well.  Without clubbing, cyanosis, or edema. NEUROLOGIC:  Alert and oriented x 3, normal gait, CN II-XII grossly intact.  MENTAL STATUS:  Appropriate. BACK:  Non-tender to palpation.  No CVAT SKIN:  Warm, dry and intact.  GU: Penis:  uncircumcised Meatus: Normal Scrotum: normal, no masses Testis: normal without masses bilateral Prostate: 40+ g, unable to feel base, NT Rectum: Normal tone,  no masses or tenderness   Results: U/A dipstick: 1+ bilirubin, 1+ protein, 1+ LE

## 2022-12-20 NOTE — Progress Notes (Signed)
[place] Phone: [phone] Subjective:   Fontaine No, am serving as a scribe for Dr. Hulan Saas.  I'm seeing this patient by the request  of:  [name], MD  CC: elbow pain   [id number]  11/09/2022 I believe the patient does have subluxation noted of the ulnar nerve.  We discussed potentially increasing the gabapentin but being careful of potential side effects.  We discussed compression sleeves, icing regimen, x-rays are ordered to further evaluate for any other bony abnormality that could be contributing.  Discussed icing regimen and home exercises.  Follow-up again in 4 weeks   Updated 12/21/2022 Seichi Shambaugh is a 67 y.o. male coming in with complaint of polyarthralgia. Patient states that he continues to have tightness in the L quad and L medial hamstring. Feels like L knee is  more swollen than the R side. Patient notes a feeling of exhaustion in both quads after being seated for a while. Antalgic gait. Saw spine and scoliosis and they spoke with patient about hip bursitis as he does have some pain over greater trochanter and piriformis. Notes falling 2x since we last saw patient.   Elbow pain has improved last visit.       Past Medical History:  Diagnosis Date   Hyperlipidemia    Hypertension    Lumbar disc disease    Prostate cancer ([place])    Past Surgical History:  Procedure Laterality Date   LUMBAR DISC SURGERY  2014   LUMBAR [place]     right ankle fracture  1978   external pinning   Social History   Socioeconomic History   Marital status: Single    Spouse name: Not on file   Number of children: Not on file   Years of education: Not on file   Highest education level: Some college, no degree  Occupational History   Not on file  Tobacco Use   Smoking status: Never    Passive exposure: Never   Smokeless tobacco: Never  Vaping Use   Vaping Use: Never used   Substance and Sexual Activity   Alcohol use: Not Currently    Comment: rare   Drug use: No   Sexual activity: Yes  Other Topics Concern   Not on file  Social History Narrative   Not on file   Social Determinants of Health   Financial Resource Strain: Medium Risk (12/04/2021)   Overall Financial Resource Strain (CARDIA)    Difficulty of Paying Living Expenses: Somewhat hard  Food Insecurity: Food Insecurity Present (12/04/2021)   Hunger Vital Sign    Worried About Running Out of Food in the Last Year: Sometimes true    Ran Out of Food in the Last Year: Sometimes true  Transportation Needs: No Transportation Needs (12/04/2021)   PRAPARE - Hydrologist (Medical): No    Lack of Transportation (Non-Medical): No  Physical Activity: Insufficiently Active (12/04/2021)   Exercise Vital Sign    Days of Exercise per Week: 2 days    Minutes of Exercise per Session: 30 min  Stress: Stress Concern Present (12/04/2021)   Leslie    Feeling of Stress : To some extent  Social Connections: Moderately Isolated (12/04/2021)   Social Connection and Isolation Panel [NHANES]    Frequency of Communication with Friends and Family: Twice a week    Frequency of Social  Gatherings with Friends and Family: Once a week    Attends Religious Services: 1 to 4 times per year    Active Member of [employer] or Organizations: No    Attends Music therapist: Not on file    Marital Status: Divorced   No Known Allergies Family History  Problem Relation Age of Onset   High blood pressure Mother    Prostate cancer Father      Current Outpatient Medications (Cardiovascular):    atorvastatin (LIPITOR) 40 MG tablet, Take 1 tablet (40 mg total) by mouth daily.   hydrALAZINE (APRESOLINE) 25 MG tablet, Take 1 tablet (25 mg total) by mouth 3 (three) times daily.   triamterene-hydrochlorothiazide (DYAZIDE) 37.5-25 MG  capsule, Take 1 each (1 capsule total) by mouth daily.     Current Outpatient Medications (Other):    Baclofen 5 MG TABS, Take 5 mg by mouth at bedtime.   gabapentin (NEURONTIN) 300 MG capsule, Take 1 capsule (300 mg total) by mouth 3 (three) times daily.   GARLIC PO, Take by mouth daily.   Reviewed prior external information including notes and imaging from  primary care provider As well as notes that were available from care everywhere and other healthcare systems. Scheduled for MRI 3/3 for prostate antigen has been increasing, did see urology   Past medical history, social, surgical and family history all reviewed in electronic medical record.  No pertanent information unless stated regarding to the chief complaint.   Review of Systems:  No headache, visual changes, nausea, vomiting, diarrhea, constipation, dizziness, abdominal pain, skin rash, fevers, chills, night sweats, weight loss, swollen lymph nodes, , joint swelling, chest pain, shortness of breath, mood changes. POSITIVE muscle aches, body aches  Objective  Blood pressure 118/84, height '6\' 4"'$  (1.93 m), weight 236 lb (107 kg), SpO2 97 %.   General: No apparent distress alert and oriented x3 mood and affect normal, dressed appropriately.  HEENT: Pupils equal, extraocular movements intact  Respiratory: Patient's speak in full sentences and does not appear short of breath  Cardiovascular: No lower extremity edema, non tender, no erythema  Back exam does have significant loss of lordosis.  Patient's back exam does have tightness noted as well.  Patient has difficulty going from a seated to standing position.  Walks with more of externally rotated hips.  Weakness of the gluteal area bilaterally.  No significant weakness noted of the lower extremity at this time.    Impression and Recommendations:     The above documentation has been reviewed and is accurate and complete [name], DO

## 2022-12-21 ENCOUNTER — Ambulatory Visit: Payer: Medicare Other

## 2022-12-21 ENCOUNTER — Ambulatory Visit (INDEPENDENT_AMBULATORY_CARE_PROVIDER_SITE_OTHER): Payer: Medicare Other | Admitting: Family Medicine

## 2022-12-21 VITALS — BP 118/84 | Ht 76.0 in | Wt 236.0 lb

## 2022-12-21 DIAGNOSIS — M51369 Other intervertebral disc degeneration, lumbar region without mention of lumbar back pain or lower extremity pain: Secondary | ICD-10-CM | POA: Insufficient documentation

## 2022-12-21 DIAGNOSIS — M5416 Radiculopathy, lumbar region: Secondary | ICD-10-CM

## 2022-12-21 DIAGNOSIS — M25522 Pain in left elbow: Secondary | ICD-10-CM | POA: Diagnosis not present

## 2022-12-21 DIAGNOSIS — M5136 Other intervertebral disc degeneration, lumbar region: Secondary | ICD-10-CM

## 2022-12-21 NOTE — Patient Instructions (Signed)
Epidural L3/L4 Schedule it the 2nd week of March I want MRI first Aquatic PT at [facility] If we do injection see me 6-8 weeks afterwards

## 2022-12-21 NOTE — Assessment & Plan Note (Addendum)
Degenerative disc disease of the lumbar spine.  Discussed icing regimen and home exercises, which activities to do and which ones to avoid.  Discussed icing regimen.  I do believe that patient is having more of a nerve impingement at the L2-L3 and L3-L4 levels.  I think we will do an injection and see how patient responds.  I do believe though that I am concerned that patient's elevated prostate could be the other concerning aspect.  Would like him to wait on the injections until we see patient's MRI of the prostate to make sure there is no other metastasis that could be contributing to some of his discomfort and pain.  Patient is in agreement with the plan if we do the injection would see patient again 6 to 8 weeks afterwards.  No change in medications and encouraged the gabapentin.  Total time with patient today 36 minutes  Patient at the end that would like to try aquatic therapy again which patient did not feel like was making improvement initially.

## 2022-12-30 ENCOUNTER — Ambulatory Visit
Admission: RE | Admit: 2022-12-30 | Discharge: 2022-12-30 | Disposition: A | Discharge status: Home / Self Care | Payer: Medicare Other | Source: Ambulatory Visit | Attending: Urology | Admitting: Urology

## 2022-12-30 DIAGNOSIS — C61 Malignant neoplasm of prostate: Secondary | ICD-10-CM

## 2022-12-30 MED ORDER — GADOPICLENOL 0.5 MMOL/ML IV SOLN
10.0000 mL | Freq: Once | INTRAVENOUS | Status: AC | PRN
  Administered 2022-12-30: 10 mL via INTRAVENOUS

## 2023-01-21 ENCOUNTER — Ambulatory Visit (INDEPENDENT_AMBULATORY_CARE_PROVIDER_SITE_OTHER): Payer: Medicare Other | Admitting: Internal Medicine

## 2023-01-21 ENCOUNTER — Ambulatory Visit (INDEPENDENT_AMBULATORY_CARE_PROVIDER_SITE_OTHER): Payer: Medicare Other

## 2023-01-21 VITALS — BP 156/94 | HR 53 | Temp 97.7°F | Ht 76.0 in | Wt 240.4 lb

## 2023-01-21 DIAGNOSIS — R109 Unspecified abdominal pain: Secondary | ICD-10-CM | POA: Diagnosis not present

## 2023-01-21 DIAGNOSIS — R0789 Other chest pain: Secondary | ICD-10-CM | POA: Diagnosis not present

## 2023-01-21 DIAGNOSIS — E559 Vitamin D deficiency, unspecified: Secondary | ICD-10-CM | POA: Diagnosis not present

## 2023-01-21 DIAGNOSIS — R0781 Pleurodynia: Secondary | ICD-10-CM | POA: Diagnosis not present

## 2023-01-21 DIAGNOSIS — R079 Chest pain, unspecified: Secondary | ICD-10-CM | POA: Diagnosis not present

## 2023-01-21 MED ORDER — TRAMADOL HCL 50 MG PO TABS: 50.0000 mg | ORAL_TABLET | Freq: Four times a day (QID) | ORAL | 0 refills | Status: DC | PRN

## 2023-01-21 NOTE — Patient Instructions (Signed)
Please take all new medication as prescribed - the tramadol as needed for pain (and call after 1 wk if you need further refill)  Please continue all other medications as before, and refills have been done if requested.  Please have the pharmacy call with any other refills you may need.  Please keep your appointments with your specialists as you may have planned  Please go to the XRAY Department in the first floor for the x-ray testing  Please go to the LAB at the blood drawing area for the tests to be done - just the urine test today

## 2023-01-21 NOTE — Progress Notes (Unsigned)
Patient ID: Michael [name], male   DOB: Jan 13, 1956, 67 y.o.   MRN: [id number]        Chief Complaint: follow up post fall with left rib pain       HPI:  Michael [name] is a 67 y.o. male here after an accidental fall x 1 yesterday with walking outside the home on uneven ground, fell to his left with a misstep to left side on grass without immediate injury though did jar quite severely, not sure if left arm was jammed but no arm swelling, bruising or pain;  now with mod to occas severe pain to left postlat chest wall also without swelling or bruising, but mild sob and pleuritic pain persists.     Pain worse with cough and clearing throat, and really any movement from sitting at all.  No hx of copd, asthma or tobacco use.  Also has mild pain to left flank area but not sure if related, but Denies urinary symptoms such as dysuria, frequency, urgency, hematuria or n/v, fever, chills.  Pt denies orthopnea, PND, increased LE swelling, palpitations, dizziness or syncope.       Wt Readings from Last 3 Encounters:  01/21/23 240 lb 6 oz (109 kg)  12/21/22 236 lb (107 kg)  12/05/22 237 lb (107.5 kg)   BP Readings from Last 3 Encounters:  01/21/23 (!) 156/94  12/21/22 118/84  12/05/22 (!) 172/72         Past Medical History:  Diagnosis Date   Hyperlipidemia    Hypertension    Lumbar disc disease    Prostate cancer [facility])    Past Surgical History:  Procedure Laterality Date   LUMBAR DISC SURGERY  2014   LUMBAR [place]     right ankle fracture  1978   external pinning    reports that he has never smoked. He has never been exposed to tobacco smoke. He has never used smokeless tobacco. He reports that he does not currently use alcohol. He reports that he does not use drugs. family history includes High blood pressure in his mother; Prostate cancer in his father. No Known Allergies Current Outpatient Medications on File Prior to Visit  Medication Sig Dispense Refill   atorvastatin  (LIPITOR) 40 MG tablet Take 1 tablet (40 mg total) by mouth daily. 90 tablet 1   Baclofen 5 MG TABS Take 5 mg by mouth at bedtime. 5 tablet 0   gabapentin (NEURONTIN) 300 MG capsule Take 1 capsule (300 mg total) by mouth 3 (three) times daily. [id number] capsule 1   GARLIC PO Take by mouth daily.     hydrALAZINE (APRESOLINE) 25 MG tablet Take 1 tablet (25 mg total) by mouth 3 (three) times daily. 270 tablet 0   triamterene-hydrochlorothiazide (DYAZIDE) 37.5-25 MG capsule Take 1 each (1 capsule total) by mouth daily. 90 capsule 0   No current facility-administered medications on file prior to visit.        ROS:  All others reviewed and negative.  Objective        PE:  BP (!) 156/94   Pulse (!) 53   Temp 97.7 F (36.5 C) (Temporal)   Ht 6\' 4"  (1.93 m)   Wt 240 lb 6 oz (109 kg)   SpO2 95%   BMI 29.26 kg/m                 Constitutional: Pt appears in NAD  HENT: Head: NCAT.                Right Ear: External ear normal.                 Left Ear: External ear normal.                Eyes: . Pupils are equal, round, and reactive to light. Conjunctivae and EOM are normal               Nose: without d/c or deformity               Neck: Neck supple. Gross normal ROM               Cardiovascular: Normal rate and regular rhythm.  ; has tender area about t4-6 left postlat chest wall without swelling or bruising               Pulmonary/Chest: Effort normal and breath sounds without rales or wheezing.                Abd:  Soft, NT, ND, + BS, no organomegaly, mild left flank tender as well               Neurological: Pt is alert. At baseline orientation, motor grossly intact               Skin: Skin is warm. No rashes, no other new lesions, LE edema - none               Psychiatric: Pt behavior is normal without agitation   Micro: none  Cardiac tracings I have personally interpreted today:  none  Pertinent Radiological findings (summarize): none   Lab Results  Component Value Date    WBC 8.1 11/13/2022   HGB 15.1 11/13/2022   HCT 45.2 11/13/2022   PLT 169 11/13/2022   GLUCOSE 126 (H) 11/13/2022   CHOL 177 11/22/2022   TRIG 59.0 11/22/2022   HDL 49.10 11/22/2022   LDLCALC 116 (H) 11/22/2022   ALT 15 11/13/2022   AST 24 11/13/2022   NA 141 11/13/2022   K 4.2 11/13/2022   CL 107 11/13/2022   CREATININE 0.80 11/13/2022   BUN 16 11/13/2022   CO2 21 (L) 11/13/2022   TSH 5.02 11/22/2022   PSA 10.42 (H) 11/22/2022   HGBA1C 5.9 11/22/2022   Assessment/Plan:  Michael [name] is a 67 y.o. American [place] or [place] Native [3] Black or [place] American [2] Other or two or more races [6] male with  has a past medical history of Hyperlipidemia, Hypertension, Lumbar disc disease, and Prostate cancer ([place]).  Left-sided chest wall pain C/w msk strain post fall, for cxr and rib films but suspect no signfiicant abnormal, declines  pain med,  to f/u any worsening symptoms or concerns  Left flank pain ? Msk vs referred pain vs renal - for UA as well  Vitamin D deficiency Last vitamin D Lab Results  Component Value Date   VD25OH 20.42 (L) 04/05/2022   Low, reminded to start oral replacement  Followup: Return if symptoms worsen or fail to improve.  Cathlean Cower, MD 01/23/2023 7:56 PM [facility]

## 2023-01-22 LAB — URINALYSIS, ROUTINE W REFLEX MICROSCOPIC
Bilirubin Urine: NEGATIVE
Hgb urine dipstick: NEGATIVE
Ketones, ur: NEGATIVE
Leukocytes,Ua: NEGATIVE
Nitrite: NEGATIVE
RBC / HPF: NONE SEEN (ref 0–?)
Specific Gravity, Urine: >=1.03 — AB (ref 1.000–1.030)
Total Protein, Urine: 30 — AB
Urine Glucose: NEGATIVE
Urobilinogen, UA: 1 (ref 0.0–1.0)
pH: 5.5 (ref 5.0–8.0)

## 2023-01-23 ENCOUNTER — Telehealth: Payer: Self-pay | Admitting: Internal Medicine

## 2023-01-23 ENCOUNTER — Encounter: Payer: Self-pay | Admitting: Internal Medicine

## 2023-01-23 NOTE — Telephone Encounter (Signed)
Pt called wanted to know his results from his xray and pt also stated he have been having sharp pains in his ribs.  Best call back # [phone]  Please call pt with update

## 2023-01-23 NOTE — Telephone Encounter (Signed)
LVM stating that results are not back yet.

## 2023-01-23 NOTE — Telephone Encounter (Signed)
Results not yet on the chart

## 2023-01-23 NOTE — Assessment & Plan Note (Signed)
C/w msk strain post fall, for cxr and rib films but suspect no signfiicant abnormal, declines  pain med,  to f/u any worsening symptoms or concerns

## 2023-01-23 NOTE — Assessment & Plan Note (Signed)
Last vitamin D Lab Results  Component Value Date   VD25OH 20.42 (L) 04/05/2022   Low, reminded to start oral replacement

## 2023-01-23 NOTE — Assessment & Plan Note (Signed)
?   Msk vs referred pain vs renal - for UA as well

## 2023-01-24 ENCOUNTER — Encounter: Payer: Self-pay | Admitting: Internal Medicine

## 2023-01-29 ENCOUNTER — Other Ambulatory Visit: Payer: Self-pay

## 2023-01-29 ENCOUNTER — Ambulatory Visit (HOSPITAL_BASED_OUTPATIENT_CLINIC_OR_DEPARTMENT_OTHER): Payer: Medicare Other | Attending: Family Medicine | Admitting: Physical Therapy

## 2023-01-29 DIAGNOSIS — M6281 Muscle weakness (generalized): Secondary | ICD-10-CM | POA: Diagnosis not present

## 2023-01-29 DIAGNOSIS — M5416 Radiculopathy, lumbar region: Secondary | ICD-10-CM | POA: Insufficient documentation

## 2023-01-29 DIAGNOSIS — M5459 Other low back pain: Secondary | ICD-10-CM

## 2023-01-29 DIAGNOSIS — R293 Abnormal posture: Secondary | ICD-10-CM

## 2023-01-29 DIAGNOSIS — G8929 Other chronic pain: Secondary | ICD-10-CM

## 2023-01-29 DIAGNOSIS — M62830 Muscle spasm of back: Secondary | ICD-10-CM | POA: Diagnosis not present

## 2023-01-29 NOTE — Therapy (Unsigned)
OUTPATIENT PHYSICAL THERAPY THORACOLUMBAR EVALUATION   Patient Name: Michael [name] MRN: [id number] DOB:[birth year], 67 y.o., male Today's Date: 01/29/2023  END OF SESSION:   Past Medical History:  Diagnosis Date   Hyperlipidemia    Hypertension    Lumbar disc disease    Prostate cancer [facility])    Past Surgical History:  Procedure Laterality Date   LUMBAR DISC SURGERY  2014   LUMBAR [place]     right ankle fracture  1978   external pinning   Patient Active Problem List   Diagnosis Date Noted   Left-sided chest wall pain 01/21/2023   Left flank pain 01/21/2023   DDD (degenerative disc disease), lumbar 12/21/2022   Prostate cancer ([place]); T1c; GG 1; initial diagnosis 2011 12/05/2022   Chronic hyperglycemia 11/16/2022   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   Contusion of ulnar nerve, initial encounter 11/09/2022   Dyslipidemia, goal LDL below 70 09/23/2022   Elevated PSA, between 10 and less than 20 ng/ml 09/17/2022   Accelerated hypertension 09/17/2022   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   Vitamin D deficiency 09/07/2021    PCP: Scarlette Calico, MD  REFERRING PROVIDER: Hulan Saas, DO  REFERRING DIAG: ***  Rationale for Evaluation and Treatment: Rehabilitation  THERAPY DIAG:  No diagnosis found.  ONSET DATE: ***  SUBJECTIVE:                                                                                                                                                                                           SUBJECTIVE STATEMENT: Patient exprience spasm in his lower leg and back area on the left side.   PERTINENT HISTORY:  ***  PAIN:  Are you having pain? Yes: NPRS scale: 5/10 Pain location: left lumbar region/lower leg Pain description: hamstring tightness  Aggravating factors: sitting for 20 mins Relieving factors: gabapentin  PRECAUTIONS: {Therapy precautions:24002}  WEIGHT BEARING RESTRICTIONS: No  FALLS:  Has patient fallen  in last 6 months? Yes. Number of falls 3  LIVING ENVIRONMENT: Lives with: lives with their family Lives in: House/apartment Stairs: Yes: External: 2 steps; none Has following equipment at home: Single point cane, use time to time   OCCUPATION: retired  PLOF: Independent  PATIENT GOALS:  Be able to bend to tie shoes  NEXT MD VISIT: ***  OBJECTIVE:   DIAGNOSTIC FINDINGS:  ***  PATIENT SURVEYS:  {rehab surveys:24030}  SCREENING FOR RED FLAGS: Bowel or bladder incontinence: {Yes/No:304960894} Spinal tumors: {Yes/No:304960894} Cauda equina syndrome: {Yes/No:304960894} Compression fracture: {Yes/No:304960894} Abdominal aneurysm: {Yes/No:304960894}  COGNITION: Overall cognitive status: Within functional limits for tasks assessed     SENSATION: no Numbness/tingling  MUSCLE  LENGTH: Hamstrings: Right 55 deg from full extension; Left 35 deg from full extension  POSTURE: {posture:25561}  PALPATION: ***  LUMBAR ROM:   AROM eval  Flexion   Extension Stands in in 15 degrees of flexion   Right lateral flexion    Left lateral flexion    Right rotation 50% limited  Left rotation 50% limited   (Blank rows = not tested)  LOWER EXTREMITY ROM:     {AROM/PROM:27142}  Right eval Left eval  Hip flexion    Hip extension    Hip abduction    Hip adduction    Hip internal rotation    Hip external rotation    Knee flexion    Knee extension    Ankle dorsiflexion    Ankle plantarflexion    Ankle inversion    Ankle eversion     (Blank rows = not tested)  LOWER EXTREMITY MMT:    MMT Right eval Left eval  Hip flexion 16.1 15.2  Hip extension    Hip abduction 27.6 35  Hip adduction    Hip internal rotation    Hip external rotation    Knee flexion    Knee extension 38.7 55.6  Ankle dorsiflexion 43.5 44.9  Ankle plantarflexion    Ankle inversion    Ankle eversion     (Blank rows = not tested)  LUMBAR SPECIAL TESTS:  {lumbar special test:25242}  FUNCTIONAL  TESTS:  {Functional tests:24029}  GAIT:  Assistive device utilized: Single point cane as needed. Level of assistance: Complete Independence Comments:   TODAY'S TREATMENT:                                                                                                                              DATE:  4/2   PATIENT EDUCATION:  Education details:  Person educated: Patient Education method: Consulting civil engineer, Media planner, Corporate treasurer cues, Verbal cues, and Handouts Education comprehension: verbalized understanding, returned demonstration, verbal cues required, tactile cues required, and needs further education  HOME EXERCISE PROGRAM:   ASSESSMENT:  CLINICAL IMPRESSION: Patient is a 67 y.o. male who was seen today for physical therapy evaluation and treatment for ***.   OBJECTIVE IMPAIRMENTS: Abnormal gait, decreased activity tolerance, decreased balance, decreased endurance, difficulty walking, decreased ROM, and decreased strength.   ACTIVITY LIMITATIONS: carrying, lifting, bending, sitting, standing, squatting, and stairs  PARTICIPATION LIMITATIONS: meal prep, laundry, shopping, community activity, and yard work  PERSONAL FACTORS: {Personal factors:25162} are also affecting patient's functional outcome.   REHAB POTENTIAL: Excellent  CLINICAL DECISION MAKING: Stable/uncomplicated  EVALUATION COMPLEXITY: Moderate   GOALS: Goals reviewed with patient? Yes  SHORT TERM GOALS: Target date: ***  Patient will right side hamstring by 10 degrees. Baseline: Goal status: INITIAL  2.  Patient will increase rotation by 25% bilaterally.  Baseline:  Goal status: INITIAL  3.  Patient will be able to bend down without any discomfort or increase of pain.  Baseline:  Goal status: {GOALSTATUS:25110}  4.  ***  Baseline:  Goal status: {GOALSTATUS:25110}  5.  *** Baseline:  Goal status: {GOALSTATUS:25110}  6.  *** Baseline:  Goal status: {GOALSTATUS:25110}  LONG TERM GOALS:  Target date: ***  Patient will be able to walk community distances without significantly increase pain. Baseline:  Goal status: INITIAL  2.  Patient will be able to stand for longer greater than 5 minutes without significantly increase pain.  Baseline:  Goal status: INITIAL  3.  *** Baseline:  Goal status: {GOALSTATUS:25110}  4.  *** Baseline:  Goal status: {GOALSTATUS:25110}  5.  *** Baseline:  Goal status: {GOALSTATUS:25110}  6.  *** Baseline:  Goal status: {GOALSTATUS:25110}  PLAN:  PT FREQUENCY: 1-2x/week  PT DURATION: 8 weeks  PLANNED INTERVENTIONS: Therapeutic exercises, Therapeutic activity, Neuromuscular re-education, Balance training, Gait training, Patient/Family education, Self Care, Joint mobilization, Joint manipulation, Aquatic Therapy, Dry Needling, Electrical stimulation, Spinal mobilization, Moist heat, and Ultrasound.  PLAN FOR NEXT SESSION: Consider TUG, 30 second balance test, 46m  walk test, 5 sit to stand. Consider HEP that engages the quad and hamstring stretches to increase ROM.    Carney Living, PT 01/29/2023, 2:47 PM

## 2023-01-30 NOTE — Therapy (Addendum)
OUTPATIENT PHYSICAL THERAPY THORACOLUMBAR EVALUATION   Patient Name: Michael [name] MRN: [id number] DOB:[birth year], 67 y.o., male Today's Date: 01/30/2023  END OF SESSION:  PT End of Session - 01/29/23 1554     Visit Number 1    Number of Visits 16    Date for PT Re-Evaluation 03/27/23    PT Start Time 1448   Patient 15 min late   PT Stop Time 1518    PT Time Calculation (min) 30 min    Activity Tolerance Patient tolerated treatment well    Behavior During Therapy [facility] for tasks assessed/performed             Past Medical History:  Diagnosis Date   Hyperlipidemia    Hypertension    Lumbar disc disease    Prostate cancer [facility])    Past Surgical History:  Procedure Laterality Date   LUMBAR DISC SURGERY  2014   LUMBAR [place]     right ankle fracture  1978   external pinning   Patient Active Problem List   Diagnosis Date Noted   Left-sided chest wall pain 01/21/2023   Left flank pain 01/21/2023   DDD (degenerative disc disease), lumbar 12/21/2022   Prostate cancer ([place]); T1c; GG 1; initial diagnosis 2011 12/05/2022   Chronic hyperglycemia 11/16/2022   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   Contusion of ulnar nerve, initial encounter 11/09/2022   Dyslipidemia, goal LDL below 70 09/23/2022   Elevated PSA, between 10 and less than 20 ng/ml 09/17/2022   Accelerated hypertension 09/17/2022   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   Vitamin D deficiency 09/07/2021    PCP: Scarlette Calico, MD  REFERRING PROVIDER: Hulan Saas, DO  REFERRING DIAG: [phone] (ICD-10-CM) - Lumbar radiculopathy   Rationale for Evaluation and Treatment: Rehabilitation  THERAPY DIAG:  Muscle spasm of back  Muscle weakness (generalized)  Other low back pain  Abnormal posture  ONSET DATE: March 2024  SUBJECTIVE:                                                                                                                                                                                            SUBJECTIVE STATEMENT: Patient exprience spasm in his lower back and thigh region on the left side. Patient reports using a cane here and there to help ambulate around the community. Patient has experience 3 falls with one being recent within the last few weeks. Patient reports that his balance has decreased over the past couple of months. He caught his foot on the ground in his most recent fall. He suffered bruised ribs. His x-rays were negative. He had physical therapy  in the past with some improvement.   PERTINENT HISTORY:  Left sided chest wall pain, DDD lumbar, hypertension, prostate cancer   PAIN:  Are you having pain? Yes: NPRS scale: 5/10 Pain location: left lumbar region/thigh Pain description: hamstring tightness  Aggravating factors: sitting for 20 mins Relieving factors: gabapentin  PRECAUTIONS: Falls  WEIGHT BEARING RESTRICTIONS: No  FALLS:  Has patient fallen in last 6 months? Yes. Number of falls 3  LIVING ENVIRONMENT: Lives with: lives with their family Lives in: House/apartment Stairs: Yes: External: 2 steps; none Has following equipment at home: Single point cane, use time to time   OCCUPATION: retired  PLOF: Independent  PATIENT GOALS:  Be able to bend to tie shoes, get back to walking for exercise and pain free  NEXT MD VISIT: none  OBJECTIVE:   DIAGNOSTIC FINDINGS:  Patient hamstring are tight, unable to stand upright, gait velocity is slow,  PATIENT SURVEYS:  FOTO    SCREENING FOR RED FLAGS: Bowel or bladder incontinence: No Spinal tumors: No Cauda equina syndrome: No Compression fracture: lumbar disc surgery Abdominal aneurysm: No  COGNITION: Overall cognitive status: Within functional limits for tasks assessed     SENSATION: Patient reports no Numbness/tingling  MUSCLE LENGTH: Hamstrings: Right 55 deg from full extension; Left 35 deg from full extension  POSTURE: forward head and flexed trunk    PALPATION: TTP low back and left gluteal region  Balance: narrow base CGA  Narrow base eyes closed CGA  Tandem left and right CGA   LUMBAR ROM:   AROM eval  Flexion   Extension Stands in in 15 degrees of flexion   Right lateral flexion    Left lateral flexion    Right rotation 50% limited  Left rotation 50% limited   (Blank rows = not tested)  LOWER EXTREMITY ROM:     Passive  Right eval Left eval  Hip flexion    Hip extension    Hip abduction    Hip adduction    Hip internal rotation    Hip external rotation    Knee flexion    Knee extension    Ankle dorsiflexion    Ankle plantarflexion    Ankle inversion    Ankle eversion     (Blank rows = not tested)  LOWER EXTREMITY MMT:    MMT Right eval Left eval  Hip flexion 16.1 15.2  Hip extension    Hip abduction 27.6 35  Hip adduction    Hip internal rotation    Hip external rotation    Knee flexion    Knee extension 38.7 55.6  Ankle dorsiflexion 43.5 44.9  Ankle plantarflexion    Ankle inversion    Ankle eversion     (Blank rows = not tested)  LUMBAR SPECIAL TESTS:  Checked his hamstring length   FUNCTIONAL TESTS:   Hamstring length: 90/90 40 degrees from straight bilateral   GAIT:  Assistive device utilized: Single point cane as needed. Level of assistance: Complete Independence Comments:   TODAY'S TREATMENT:  DATE:  4/2  PATIENT EDUCATION:  Education details:  Person educated: Patient Education method: Consulting civil engineer, Media planner, Corporate treasurer cues, Verbal cues, and Handouts Education comprehension: verbalized understanding, returned demonstration, verbal cues required, tactile cues required, and needs further education  HOME EXERCISE PROGRAM: Unable to provide 2nd to time   ASSESSMENT:  CLINICAL IMPRESSION: Patient is a 67 y.o. male who was seen today for physical  therapy evaluation and treatment for low back muscle spasm and left thigh pain. Patient has tight hamstring, is unable to stand up right, bend over to tight his shoes and slow gait speed. Patient would benefit from skilled therapy to increase ROM, strength, balance and upright posture in order to perform ADL's without  pain and prevent future falls.  OBJECTIVE IMPAIRMENTS: Abnormal gait, decreased activity tolerance, decreased balance, decreased endurance, difficulty walking, decreased ROM, and decreased strength. Pain   ACTIVITY LIMITATIONS: carrying, lifting, bending, sitting, standing, squatting, and stairs  PARTICIPATION LIMITATIONS: meal prep, laundry, shopping, community activity, and yard work  PERSONAL FACTORS: 3+ comorbidities: , lumbar DDD, left sided wall pain,prostate cancer  are also affecting patient's functional outcome.   REHAB POTENTIAL: Excellent  CLINICAL DECISION MAKING: evolving: increased falls and decreased mobility   EVALUATION COMPLEXITY: Moderate   GOALS: Goals reviewed with patient? Yes  SHORT TERM GOALS: Target date: 02/12/23  Patient will increase right side hamstring ROM by 10 degrees. Baseline: Goal status: INITIAL  2.  Patient will increase rotation by 25% bilaterally.  Baseline:  Goal status: INITIAL  3.  Patient will be able to bend down without any discomfort or increase of pain.  Baseline:  Goal status: INITIAL  4.  Patient will be independent with his HEP. Baseline:  Goal status: INITIAL  LONG TERM GOALS: Target date: 03/05/23  Patient will be able to walk community distances without significantly increase pain. Baseline:  Goal status: INITIAL  2.  Patient will be able to stand for greater than 15 minutes without significantly increase pain to perform ADL's. Baseline:  Goal status: INITIAL  3.  Patient will be able to sit down for greater than  30 minutes without back muscle spasm.  Baseline:  Goal status: INITIAL  4.  Patient will  be able to get back to his walking program for exercise. Baseline:  Goal status: INITIAL    PLAN:  PT FREQUENCY: 1-2x/week  PT DURATION: 8 weeks  PLANNED INTERVENTIONS: Therapeutic exercises, Therapeutic activity, Neuromuscular re-education, Balance training, Gait training, Patient/Family education, Self Care, Joint mobilization, Joint manipulation, Aquatic Therapy, Dry Needling, Electrical stimulation, Spinal mobilization, Moist heat, and Ultrasound.  PLAN FOR NEXT SESSION: Consider TUG, 30 second balance test, 21m  walk test, 5 sit to stand. Consider HEP for quad and hamstring stretches to increase ROM. Consider core strengthening. In the pool work on gait training and postural correction    During this treatment session, the therapist was present, participating in and directing the treatment.   Carney Living, PT 01/30/2023, 12:00 PM   Jaci Standard SPT  01/30/2023  During this treatment session, the therapist was present, participating in and directing the treatment.

## 2023-02-12 NOTE — Therapy (Signed)
OUTPATIENT PHYSICAL THERAPY THORACOLUMBAR EVALUATION   Patient Name: Michael [name] MRN: [id number] DOB:12/10/1955, 67 y.o., male Today's Date: 01/30/2023  END OF SESSION:  PT End of Session - 01/29/23 1554     Visit Number 1    Number of Visits 16    Date for PT Re-Evaluation 03/27/23    PT Start Time 1448   Patient 15 min late   PT Stop Time 1518    PT Time Calculation (min) 30 min    Activity Tolerance Patient tolerated treatment well    Behavior During Therapy WFL for tasks assessed/performed             Past Medical History:  Diagnosis Date   Hyperlipidemia    Hypertension    Lumbar disc disease    Prostate cancer ([facility])    Past Surgical History:  Procedure Laterality Date   LUMBAR DISC SURGERY  2014   LUMBAR DISC SURGERY     right ankle fracture  1978   external pinning   Patient Active Problem List   Diagnosis Date Noted   Left-sided chest wall pain 01/21/2023   Left flank pain 01/21/2023   DDD (degenerative disc disease), lumbar 12/21/2022   Prostate cancer ([facility]); T1c; GG 1; initial diagnosis 2011 12/05/2022   Chronic hyperglycemia 11/16/2022   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   Contusion of ulnar nerve, initial encounter 11/09/2022   Dyslipidemia, goal LDL below 70 09/23/2022   Elevated PSA, between 10 and less than 20 ng/ml 09/17/2022   Accelerated hypertension 09/17/2022   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   Vitamin D deficiency 09/07/2021    PCP: Thomas Jones, MD  REFERRING PROVIDER: Zachary Smith, DO  REFERRING DIAG: M54.16 (ICD-10-CM) - Lumbar radiculopathy   Rationale for Evaluation and Treatment: Rehabilitation  THERAPY DIAG:  Muscle spasm of back  Muscle weakness (generalized)  Other low back pain  Abnormal posture  ONSET DATE: March 2024  SUBJECTIVE:                                                                                                                                                                                            SUBJECTIVE STATEMENT: Patient exprience spasm in his lower back and thigh region on the left side. Patient reports using a cane here and there to help ambulate around the community. Patient has experience 3 falls with one being recent within the last few weeks. Patient reports that his balance has decreased over the past couple of months. He caught his foot on the ground in his most recent fall. He suffered bruised ribs. His x-rays were negative. He had physical therapy   in the past with some improvement.   PERTINENT HISTORY:  Left sided chest wall pain, DDD lumbar, hypertension, prostate cancer   PAIN:  Are you having pain? Yes: NPRS scale: 5/10 Pain location: left lumbar region/thigh Pain description: hamstring tightness  Aggravating factors: sitting for 20 mins Relieving factors: gabapentin  PRECAUTIONS: Falls  WEIGHT BEARING RESTRICTIONS: No  FALLS:  Has patient fallen in last 6 months? Yes. Number of falls 3  LIVING ENVIRONMENT: Lives with: lives with their family Lives in: House/apartment Stairs: Yes: External: 2 steps; none Has following equipment at home: Single point cane, use time to time   OCCUPATION: retired  PLOF: Independent  PATIENT GOALS:  Be able to bend to tie shoes, get back to walking for exercise and pain free  NEXT MD VISIT: none  OBJECTIVE:   DIAGNOSTIC FINDINGS:  Patient hamstring are tight, unable to stand upright, gait velocity is slow,  PATIENT SURVEYS:  FOTO    SCREENING FOR RED FLAGS: Bowel or bladder incontinence: No Spinal tumors: No Cauda equina syndrome: No Compression fracture: lumbar disc surgery Abdominal aneurysm: No  COGNITION: Overall cognitive status: Within functional limits for tasks assessed     SENSATION: Patient reports no Numbness/tingling  MUSCLE LENGTH: Hamstrings: Right 55 deg from full extension; Left 35 deg from full extension  POSTURE: forward head and flexed trunk    PALPATION: TTP low back and left gluteal region  Balance: narrow base CGA  Narrow base eyes closed CGA  Tandem left and right CGA   LUMBAR ROM:   AROM eval  Flexion   Extension Stands in in 15 degrees of flexion   Right lateral flexion    Left lateral flexion    Right rotation 50% limited  Left rotation 50% limited   (Blank rows = not tested)  LOWER EXTREMITY ROM:     Passive  Right eval Left eval  Hip flexion    Hip extension    Hip abduction    Hip adduction    Hip internal rotation    Hip external rotation    Knee flexion    Knee extension    Ankle dorsiflexion    Ankle plantarflexion    Ankle inversion    Ankle eversion     (Blank rows = not tested)  LOWER EXTREMITY MMT:    MMT Right eval Left eval  Hip flexion 16.1 15.2  Hip extension    Hip abduction 27.6 35  Hip adduction    Hip internal rotation    Hip external rotation    Knee flexion    Knee extension 38.7 55.6  Ankle dorsiflexion 43.5 44.9  Ankle plantarflexion    Ankle inversion    Ankle eversion     (Blank rows = not tested)  LUMBAR SPECIAL TESTS:  Checked his hamstring length   FUNCTIONAL TESTS:   Hamstring length: 90/90 40 degrees from straight bilateral   GAIT:  Assistive device utilized: Single point cane as needed. Level of assistance: Complete Independence Comments:   TODAY'S TREATMENT:                                                                                                                                DATE:  4/2  PATIENT EDUCATION:  Education details:  Person educated: Patient Education method: Explanation, Demonstration, Tactile cues, Verbal cues, and Handouts Education comprehension: verbalized understanding, returned demonstration, verbal cues required, tactile cues required, and needs further education  HOME EXERCISE PROGRAM: Unable to provide 2nd to time   ASSESSMENT:  CLINICAL IMPRESSION: Patient is a 67 y.o. male who was seen today for physical  therapy evaluation and treatment for low back muscle spasm and left thigh pain. Patient has tight hamstring, is unable to stand up right, bend over to tight his shoes and slow gait speed. Patient would benefit from skilled therapy to increase ROM, strength, balance and upright posture in order to perform ADL's without  pain and prevent future falls.  OBJECTIVE IMPAIRMENTS: Abnormal gait, decreased activity tolerance, decreased balance, decreased endurance, difficulty walking, decreased ROM, and decreased strength. Pain   ACTIVITY LIMITATIONS: carrying, lifting, bending, sitting, standing, squatting, and stairs  PARTICIPATION LIMITATIONS: meal prep, laundry, shopping, community activity, and yard work  PERSONAL FACTORS: 3+ comorbidities: , lumbar DDD, left sided wall pain,prostate cancer  are also affecting patient's functional outcome.   REHAB POTENTIAL: Excellent  CLINICAL DECISION MAKING: evolving: increased falls and decreased mobility   EVALUATION COMPLEXITY: Moderate   GOALS: Goals reviewed with patient? Yes  SHORT TERM GOALS: Target date: 02/12/23  Patient will increase right side hamstring ROM by 10 degrees. Baseline: Goal status: INITIAL  2.  Patient will increase rotation by 25% bilaterally.  Baseline:  Goal status: INITIAL  3.  Patient will be able to bend down without any discomfort or increase of pain.  Baseline:  Goal status: INITIAL  4.  Patient will be independent with his HEP. Baseline:  Goal status: INITIAL  LONG TERM GOALS: Target date: 03/05/23  Patient will be able to walk community distances without significantly increase pain. Baseline:  Goal status: INITIAL  2.  Patient will be able to stand for greater than 15 minutes without significantly increase pain to perform ADL's. Baseline:  Goal status: INITIAL  3.  Patient will be able to sit down for greater than  30 minutes without back muscle spasm.  Baseline:  Goal status: INITIAL  4.  Patient will  be able to get back to his walking program for exercise. Baseline:  Goal status: INITIAL    PLAN:  PT FREQUENCY: 1-2x/week  PT DURATION: 8 weeks  PLANNED INTERVENTIONS: Therapeutic exercises, Therapeutic activity, Neuromuscular re-education, Balance training, Gait training, Patient/Family education, Self Care, Joint mobilization, Joint manipulation, Aquatic Therapy, Dry Needling, Electrical stimulation, Spinal mobilization, Moist heat, and Ultrasound.  PLAN FOR NEXT SESSION: Consider TUG, 30 second balance test, 10m  walk test, 5 sit to stand. Consider HEP for quad and hamstring stretches to increase ROM. Consider core strengthening. In the pool work on gait training and postural correction    During this treatment session, the therapist was present, participating in and directing the treatment.   David J Carroll, PT 01/30/2023, 12:00 PM   Alex Martinez SPT  01/30/2023  During this treatment session, the therapist was present, participating in and directing the treatment.  

## 2023-02-13 ENCOUNTER — Encounter (HOSPITAL_BASED_OUTPATIENT_CLINIC_OR_DEPARTMENT_OTHER): Payer: Self-pay | Admitting: Physical Therapy

## 2023-02-13 ENCOUNTER — Encounter: Payer: Self-pay | Admitting: Urology

## 2023-02-13 ENCOUNTER — Ambulatory Visit (HOSPITAL_BASED_OUTPATIENT_CLINIC_OR_DEPARTMENT_OTHER): Payer: Medicare Other | Admitting: Physical Therapy

## 2023-02-13 DIAGNOSIS — M62830 Muscle spasm of back: Secondary | ICD-10-CM | POA: Diagnosis not present

## 2023-02-13 DIAGNOSIS — M5459 Other low back pain: Secondary | ICD-10-CM | POA: Diagnosis not present

## 2023-02-13 DIAGNOSIS — M6281 Muscle weakness (generalized): Secondary | ICD-10-CM

## 2023-02-13 DIAGNOSIS — M5416 Radiculopathy, lumbar region: Secondary | ICD-10-CM | POA: Diagnosis not present

## 2023-02-13 DIAGNOSIS — R293 Abnormal posture: Secondary | ICD-10-CM | POA: Diagnosis not present

## 2023-02-18 ENCOUNTER — Encounter: Payer: Self-pay | Admitting: Family Medicine

## 2023-02-20 ENCOUNTER — Ambulatory Visit (HOSPITAL_BASED_OUTPATIENT_CLINIC_OR_DEPARTMENT_OTHER): Payer: Medicare Other | Admitting: Physical Therapy

## 2023-02-20 ENCOUNTER — Encounter (HOSPITAL_BASED_OUTPATIENT_CLINIC_OR_DEPARTMENT_OTHER): Payer: Self-pay

## 2023-02-20 ENCOUNTER — Emergency Department (HOSPITAL_BASED_OUTPATIENT_CLINIC_OR_DEPARTMENT_OTHER)
Admission: EM | Admit: 2023-02-20 | Discharge: 2023-02-20 | Disposition: A | Discharge status: Home / Self Care | Payer: Medicare Other | Attending: Emergency Medicine | Admitting: Emergency Medicine

## 2023-02-20 ENCOUNTER — Other Ambulatory Visit: Payer: Self-pay

## 2023-02-20 DIAGNOSIS — Z79899 Other long term (current) drug therapy: Secondary | ICD-10-CM | POA: Insufficient documentation

## 2023-02-20 DIAGNOSIS — M62838 Other muscle spasm: Secondary | ICD-10-CM | POA: Insufficient documentation

## 2023-02-20 DIAGNOSIS — D709 Neutropenia, unspecified: Secondary | ICD-10-CM | POA: Insufficient documentation

## 2023-02-20 DIAGNOSIS — M79604 Pain in right leg: Secondary | ICD-10-CM | POA: Diagnosis not present

## 2023-02-20 DIAGNOSIS — M62830 Muscle spasm of back: Secondary | ICD-10-CM

## 2023-02-20 DIAGNOSIS — M5459 Other low back pain: Secondary | ICD-10-CM

## 2023-02-20 LAB — CBC WITH DIFFERENTIAL/PLATELET
Abs Immature Granulocytes: 0 10*3/uL (ref 0.00–0.07)
Basophils Absolute: 0 10*3/uL (ref 0.0–0.1)
Basophils Relative: 1 %
Eosinophils Absolute: 0 10*3/uL (ref 0.0–0.5)
Eosinophils Relative: 1 %
HCT: 41.2 % (ref 39.0–52.0)
Hemoglobin: 14.1 g/dL (ref 13.0–17.0)
Immature Granulocytes: 0 %
Lymphocytes Relative: 42 %
Lymphs Abs: 1.5 10*3/uL (ref 0.7–4.0)
MCH: 22.6 pg — ABNORMAL LOW (ref 26.0–34.0)
MCHC: 34.2 g/dL (ref 30.0–36.0)
MCV: 65.9 fL — ABNORMAL LOW (ref 80.0–100.0)
Monocytes Absolute: 0.4 10*3/uL (ref 0.1–1.0)
Monocytes Relative: 12 %
Neutro Abs: 1.5 10*3/uL — ABNORMAL LOW (ref 1.7–7.7)
Neutrophils Relative %: 44 %
Platelets: 162 10*3/uL (ref 150–400)
RBC: 6.25 MIL/uL — ABNORMAL HIGH (ref 4.22–5.81)
RDW: 17.8 % — ABNORMAL HIGH (ref 11.5–15.5)
WBC: 3.5 10*3/uL — ABNORMAL LOW (ref 4.0–10.5)
nRBC: 0 % (ref 0.0–0.2)

## 2023-02-20 LAB — COMPREHENSIVE METABOLIC PANEL
ALT: 11 U/L (ref 0–44)
AST: 16 U/L (ref 15–41)
Albumin: 3.9 g/dL (ref 3.5–5.0)
Alkaline Phosphatase: 72 U/L (ref 38–126)
Anion gap: 7 (ref 5–15)
BUN: 18 mg/dL (ref 8–23)
CO2: 24 mmol/L (ref 22–32)
Calcium: 8.9 mg/dL (ref 8.9–10.3)
Chloride: 108 mmol/L (ref 98–111)
Creatinine, Ser: 0.71 mg/dL (ref 0.61–1.24)
GFR, Estimated: >60 mL/min (ref >60)
Glucose, Bld: 85 mg/dL (ref 70–99)
Potassium: 4 mmol/L (ref 3.5–5.1)
Sodium: 139 mmol/L (ref 135–145)
Total Bilirubin: 0.8 mg/dL (ref 0.3–1.2)
Total Protein: 6.9 g/dL (ref 6.5–8.1)

## 2023-02-20 LAB — CK: Total CK: 115 U/L (ref 49–397)

## 2023-02-20 MED ORDER — ACETAMINOPHEN 325 MG PO TABS
650.0000 mg | ORAL_TABLET | Freq: Once | ORAL | Status: AC
  Administered 2023-02-20: 650 mg via ORAL
  Filled 2023-02-20: qty 2

## 2023-02-20 NOTE — ED Notes (Signed)
Pt bladder scanned. Highest amount scanned 100 ml.

## 2023-02-20 NOTE — Therapy (Signed)
Pt arrived late with complaints of bad right hamstring cramp while driving.  Was unable to brake the car so he drove a while until cramp subsided. He needed assistance getting out of his car. Staes he has been having increased issues with crippling spasms last few days and had messaged Dr Katrinka Blazing who had not gotten back to him as of yet. I messaged Dr Katrinka Blazing of incident and pt was assisted to ER.

## 2023-02-20 NOTE — Discharge Instructions (Signed)
You were seen in the emergency department after your leg cramp and spasm.  Your workup here showed that your electrolytes and muscle enzyme were normal and it is unclear what caused your leg to spasm today.  You can continue to take your gabapentin and baclofen as prescribed by your sports medicine doctor and you should follow-up with him in the next couple of days to have your symptoms rechecked and see if we need to make any medication changes.  You should return to the emergency department if you have significantly worsening pain in your leg, your toes turn blue, you are unable to urinate or walk or if you have any other new or concerning symptoms.

## 2023-02-20 NOTE — ED Provider Notes (Signed)
[place] EMERGENCY DEPARTMENT AT [facility] Provider Note   CSN: [id number] Arrival date & time: 02/20/23  1126     History  Chief Complaint  Patient presents with   Leg Pain    Michael [name] is a 67 y.o. male.  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  The patient states that over the last 4 years he has had progressive muscle pain and weakness in his bilateral legs and thighs.  He states that it is worse when he first stands up and gets better with ambulation.  He states that he also will get intermittent muscle spasms.  He states that today while driving he had a spasm in his right leg and he had to pull over as he was not having good control of his leg.  He states that this does happen to him fairly frequently.  He states that he is on baclofen and gabapentin for symptoms at home.  He denies any new trauma or falls or new numbness or weakness.  He denies any loss of bowel or bladder function.  The history is provided by the patient.  Leg Pain      Home Medications Prior to Admission medications   Medication Sig Start Date End Date Taking? Authorizing Provider  atorvastatin (LIPITOR) 40 MG tablet Take 1 tablet (40 mg total) by mouth daily. Patient not taking: Reported on 01/29/2023 09/23/22   [name], MD  Baclofen 5 MG TABS Take 5 mg by mouth at bedtime. 11/13/22   Netta Corrigan, PA-C  gabapentin (NEURONTIN) 300 MG capsule Take 1 capsule (300 mg total) by mouth 3 (three) times daily. 05/16/22   Judi Saa, DO  GARLIC PO Take by mouth daily.    [provider]  hydrALAZINE (APRESOLINE) 25 MG tablet Take 1 tablet (25 mg total) by mouth 3 (three) times daily. 09/18/22   [name], MD  traMADol (ULTRAM) 50 MG tablet Take 1 tablet (50 mg total) by mouth every 6 (six) hours as needed.  01/21/23   Corwin Levins, MD  triamterene-hydrochlorothiazide (DYAZIDE) 37.5-25 MG capsule Take 1 each (1 capsule total) by mouth daily. Patient not taking: Reported on 01/29/2023 09/18/22   [name], MD      Allergies    Patient has no known allergies.    Review of Systems   Review of Systems  Physical Exam Updated Vital Signs BP (!) 163/102   Pulse (!) 49   Temp 99.1 F (37.3 C) (Oral)   Resp 15   Ht 6' (1.829 m)   Wt 109 kg   SpO2 98%   BMI 32.59 kg/m  Physical Exam Vitals and nursing note reviewed.  Constitutional:      General: He is not in acute distress.    Appearance: Normal appearance.  HENT:     Head: Normocephalic and atraumatic.     Nose: Nose normal.     Mouth/Throat:     Mouth: Mucous membranes are moist.     Pharynx: Oropharynx is clear.  Eyes:     Extraocular Movements: Extraocular movements intact.     Conjunctiva/sclera: Conjunctivae normal.  Cardiovascular:     Rate and Rhythm: Normal rate and regular rhythm.     Pulses: Normal pulses.     Heart sounds: Normal heart sounds.  Pulmonary:     Effort:  Pulmonary effort is normal.     Breath sounds: Normal breath sounds.  Abdominal:     General: Abdomen is flat.     Palpations: Abdomen is soft.     Tenderness: There is no abdominal tenderness.  Musculoskeletal:        General: No tenderness (Bilateral lower extremity muscles soft with soft compartments, no palpable muscle spasm). Normal range of motion.     Cervical back: Normal range of motion and neck supple.     Right lower leg: No edema.     Left lower leg: No edema.  Skin:    General: Skin is warm and dry.     Findings: No rash.  Neurological:     Mental Status: He is alert and oriented to person, place, and time. Mental status is at baseline.     Sensory: No sensory deficit.     Motor: Weakness (3/5 strenght in hip flexion bilaterally thought limited 2/2 pain, 5/5 strength in plantar/dorsiflexion bilaterally) present.  Psychiatric:         Mood and Affect: Mood normal.        Behavior: Behavior normal.     ED Results / Procedures / Treatments   Labs (all labs ordered are listed, but only abnormal results are displayed) Labs Reviewed  CBC WITH DIFFERENTIAL/PLATELET - Abnormal; Notable for the following components:      Result Value   WBC 3.5 (*)    RBC 6.25 (*)    MCV 65.9 (*)    MCH 22.6 (*)    RDW 17.8 (*)    Neutro Abs 1.5 (*)    All other components within normal limits  COMPREHENSIVE METABOLIC PANEL  CK    EKG None  Radiology No results found.  Procedures Procedures    Medications Ordered in ED Medications  acetaminophen (TYLENOL) tablet 650 mg (650 mg Oral Given 02/20/23 1227)    ED Course/ Medical Decision Making/ A&P Clinical Course as of 02/20/23 1339  Wed Feb 20, 2023  1319 Labs mildly neutropenic otherwise no acute abnormality to explain symptoms. [VK]  1320 PVR 100 making cauda equina unlikely. Symptoms likely related to patient's chronic muscle and nerve pain. He is stable for discharge home with sports medicine follow up. [VK]    Clinical Course User Index [VK] Rexford Maus, DO                             Medical Decision Making This patient presents to the ED with chief complaint(s) of RLE pain with pertinent past medical history of degenerative disc disease, chronic lower extremity pain/weakness which further complicates the presenting complaint. The complaint involves an extensive differential diagnosis and also carries with it a high risk of complications and morbidity.    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  He appears to be at his neurologic baseline per the patient and his prior outpatient records.  The patient will have  labs including electrolytes and CK to evaluate for possible rhabdo or electrolyte abnormality causing his muscle cramps and spasms.  He was given Tylenol for pain and will be closely reassessed.  Independent labs interpretation:  The following labs were independently interpreted: within normal range  Independent visualization of imaging: N/A  Consultation: - Consulted or discussed management/test interpretation w/ external professional: N/A  Consideration for admission or further workup: Patient has no emergent conditions requiring admission or further work-up at this time and is stable for discharge home with primary care follow-up  Social Determinants of health: N/A    Amount and/or Complexity of Data Reviewed Labs: ordered.  Risk OTC drugs.          Final Clinical Impression(s) / ED Diagnoses Final diagnoses:  Muscle spasm    Rx / DC Orders ED Discharge Orders     None         Rexford Maus, DO 02/20/23 1339

## 2023-02-20 NOTE — ED Triage Notes (Signed)
Patient here POV from Home.  Endorses Bilateral Leg Weakness/Pain that has been present for some Time. Was en route to facility for Aqua therapy when he began to have extreme tightness to his Right Leg. Patient had to redirect and swerve in order to not crash. Episode caused concern prompting ED Evaluation.   No fall or Recent Injury otherwise.   NAD noted during Triage. A&Ox4. GCS 15. BIB Wheelchair.

## 2023-02-26 ENCOUNTER — Encounter (HOSPITAL_BASED_OUTPATIENT_CLINIC_OR_DEPARTMENT_OTHER): Payer: Self-pay | Admitting: Physical Therapy

## 2023-02-26 ENCOUNTER — Ambulatory Visit (HOSPITAL_BASED_OUTPATIENT_CLINIC_OR_DEPARTMENT_OTHER): Payer: Medicare Other | Admitting: Physical Therapy

## 2023-02-26 ENCOUNTER — Telehealth: Payer: Self-pay

## 2023-02-26 DIAGNOSIS — M62830 Muscle spasm of back: Secondary | ICD-10-CM

## 2023-02-26 DIAGNOSIS — M5416 Radiculopathy, lumbar region: Secondary | ICD-10-CM | POA: Diagnosis not present

## 2023-02-26 DIAGNOSIS — R293 Abnormal posture: Secondary | ICD-10-CM | POA: Diagnosis not present

## 2023-02-26 DIAGNOSIS — M5459 Other low back pain: Secondary | ICD-10-CM

## 2023-02-26 DIAGNOSIS — M6281 Muscle weakness (generalized): Secondary | ICD-10-CM

## 2023-02-26 NOTE — Therapy (Signed)
OUTPATIENT PHYSICAL THERAPY THORACOLUMBAR    Patient Name: Michael [name] MRN: [id number] DOB:09-23-1956, 67 y.o., male Today's Date: 02/26/2023  END OF SESSION:  PT End of Session - 02/26/23 1041     Visit Number 3    Number of Visits 16    Date for PT Re-Evaluation 03/27/23    PT Start Time 1031    PT Stop Time 1114    PT Time Calculation (min) 43 min    Activity Tolerance Patient tolerated treatment well    Behavior During Therapy WFL for tasks assessed/performed              Past Medical History:  Diagnosis Date   Hyperlipidemia    Hypertension    Lumbar disc disease    Prostate cancer (HCC)    Past Surgical History:  Procedure Laterality Date   LUMBAR DISC SURGERY  2014   LUMBAR DISC SURGERY     right ankle fracture  1978   external pinning   Patient Active Problem List   Diagnosis Date Noted   Left-sided chest wall pain 01/21/2023   Left flank pain 01/21/2023   DDD (degenerative disc disease), lumbar 12/21/2022   Prostate cancer (HCC); T1c; GG 1; initial diagnosis 2011 12/05/2022   Chronic hyperglycemia 11/16/2022   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   Contusion of ulnar nerve, initial encounter 11/09/2022   Dyslipidemia, goal LDL below 70 09/23/2022   Elevated PSA, between 10 and less than 20 ng/ml 09/17/2022   Accelerated hypertension 09/17/2022   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   Vitamin D deficiency 09/07/2021    PCP: Sanda Linger, MD  REFERRING PROVIDER: Antoine Primas, DO  REFERRING DIAG: [phone] (ICD-10-CM) - Lumbar radiculopathy   Rationale for Evaluation and Treatment: Rehabilitation  THERAPY DIAG:  Muscle spasm of back  Other low back pain  Muscle weakness (generalized)  Abnormal posture  ONSET DATE: March 2024  SUBJECTIVE:                                                                                                                                                                                            SUBJECTIVE STATEMENT:  " I have been taking tylenol and feeling better. Pain low today"   Initial subjective Patient exprience spasm in his lower back and thigh region on the left side. Patient reports using a cane here and there to help ambulate around the community. Patient has experience 3 falls with one being recent within the last few weeks. Patient reports that his balance has decreased over the past couple of months. He caught his foot on the ground in his most recent  fall. He suffered bruised ribs. His x-rays were negative. He had physical therapy in the past with some improvement.   PERTINENT HISTORY:  Left sided chest wall pain, DDD lumbar, hypertension, prostate cancer   PAIN:  Are you having pain? Yes: NPRS scale: 1/10 Pain location: left lumbar region/thigh Pain description: hamstring tightness  Aggravating factors: sitting for 20 mins Relieving factors: gabapentin  PRECAUTIONS: Falls  WEIGHT BEARING RESTRICTIONS: No  FALLS:  Has patient fallen in last 6 months? Yes. Number of falls 3  LIVING ENVIRONMENT: Lives with: lives with their family Lives in: House/apartment Stairs: Yes: External: 2 steps; none Has following equipment at home: Single point cane, use time to time   OCCUPATION: retired  PLOF: Independent  PATIENT GOALS:  Be able to bend to tie shoes, get back to walking for exercise and pain free  NEXT MD VISIT: none  OBJECTIVE:   DIAGNOSTIC FINDINGS:  Patient hamstring are tight, unable to stand upright, gait velocity is slow,  PATIENT SURVEYS:  FOTO    SCREENING FOR RED FLAGS: Bowel or bladder incontinence: No Spinal tumors: No Cauda equina syndrome: No Compression fracture: lumbar disc surgery Abdominal aneurysm: No  COGNITION: Overall cognitive status: Within functional limits for tasks assessed     SENSATION: Patient reports no Numbness/tingling  MUSCLE LENGTH: Hamstrings: Right 55 deg from full extension; Left 35 deg from full  extension  POSTURE: forward head and flexed trunk   PALPATION: TTP low back and left gluteal region  Balance: narrow base CGA  Narrow base eyes closed CGA  Tandem left and right CGA   LUMBAR ROM:   AROM eval  Flexion   Extension Stands in in 15 degrees of flexion   Right lateral flexion    Left lateral flexion    Right rotation 50% limited  Left rotation 50% limited   (Blank rows = not tested)  LOWER EXTREMITY ROM:     Passive  Right eval Left eval  Hip flexion    Hip extension    Hip abduction    Hip adduction    Hip internal rotation    Hip external rotation    Knee flexion    Knee extension    Ankle dorsiflexion    Ankle plantarflexion    Ankle inversion    Ankle eversion     (Blank rows = not tested)  LOWER EXTREMITY MMT:    MMT Right eval Left eval  Hip flexion 16.1 15.2  Hip extension    Hip abduction 27.6 35  Hip adduction    Hip internal rotation    Hip external rotation    Knee flexion    Knee extension 38.7 55.6  Ankle dorsiflexion 43.5 44.9  Ankle plantarflexion    Ankle inversion    Ankle eversion     (Blank rows = not tested)  LUMBAR SPECIAL TESTS:  Checked his hamstring length   FUNCTIONAL TESTS:   Hamstring length: 90/90 40 degrees from straight bilateral   GAIT:  Assistive device utilized: Single point cane as needed. Level of assistance: Complete Independence Comments:   TODAY'S TREATMENT:  Pt seen for aquatic therapy today.  Treatment took place in water 3.5-4.75 ft in depth at the [facility] pool. Temp of water was 91.  Pt entered/exited the pool via stairs using step to pattern with hand rail.  *unsupported walking forward around pool then backward and side stepping 4.8.  Cues for gait  *Standing UE support on wall: hip circles cw&ccw (with difficulty); high knee marching; add/abd; hip  flex; hip IR/ER. VC for erect posture and proper execution *Standing 3.6 ft: L stretch x 3 added tail wagging; adductor stretch; "old man stretch" *plank on 3rd step with hip extension for ant core and LB stretch *Core engagement: yellow hand buoy carry forward and back x 4 widths *right QL stretching *KB press with isometric hold x 8.  Pt requires the buoyancy and hydrostatic pressure of water for support, and to offload joints by unweighting joint load by at least 50 % in navel deep water and by at least 75-80% in chest to neck deep water.  Viscosity of the water is needed for resistance of strengthening. Water current perturbations provides challenge to standing balance requiring increased core activation.    PATIENT EDUCATION:  Education details:  Person educated: Patient Education method: Programmer, multimedia, Facilities manager, Actor cues, Verbal cues, and Handouts Education comprehension: verbalized understanding, returned demonstration, verbal cues required, tactile cues required, and needs further education  HOME EXERCISE PROGRAM: Unable to provide 2nd to time   ASSESSMENT:  CLINICAL IMPRESSION: Pt reports good response from initial session in aquatics. Ms spasms from last week have reduced. Low pain sx today. Pt edu tylenol/acetaminophen vs advil/ibuprofen.  Focus today on stretching with added core strengthening. He tolerates progression of session well. Exiting pool his rle "locks up" and he requires assistance to amb toward Jacuzzi (unbilled).    Patient is a 67 y.o. male who was seen today for physical therapy evaluation and treatment for low back muscle spasm and left thigh pain. Patient has tight hamstring, is unable to stand up right, bend over to tight his shoes and slow gait speed. Patient would benefit from skilled therapy to increase ROM, strength, balance and upright posture in order to perform ADL's without  pain and prevent future falls.  OBJECTIVE IMPAIRMENTS: Abnormal  gait, decreased activity tolerance, decreased balance, decreased endurance, difficulty walking, decreased ROM, and decreased strength. Pain   ACTIVITY LIMITATIONS: carrying, lifting, bending, sitting, standing, squatting, and stairs  PARTICIPATION LIMITATIONS: meal prep, laundry, shopping, community activity, and yard work  PERSONAL FACTORS: 3+ comorbidities: , lumbar DDD, left sided wall pain,prostate cancer  are also affecting patient's functional outcome.   REHAB POTENTIAL: Excellent  CLINICAL DECISION MAKING: evolving: increased falls and decreased mobility   EVALUATION COMPLEXITY: Moderate   GOALS: Goals reviewed with patient? Yes  SHORT TERM GOALS: Target date: 02/12/23  Patient will increase right side hamstring ROM by 10 degrees. Baseline: Goal status: ongoing 02/26/23  2.  Patient will increase rotation by 25% bilaterally.  Baseline:  Goal status: ongoing 02/26/23  3.  Patient will be able to bend down without any discomfort or increase of pain.  Baseline:  Goal status: INITIAL  4.  Patient will be independent with his HEP. Baseline:  Goal status: ongoing 02/26/23  LONG TERM GOALS: Target date: 03/05/23  Patient will be able to walk community distances without significantly increase pain. Baseline:  Goal status: INITIAL  2.  Patient will be able to stand for greater than 15 minutes without significantly increase pain to perform ADL's. Baseline:  Goal  status: INITIAL  3.  Patient will be able to sit down for greater than  30 minutes without back muscle spasm.  Baseline:  Goal status: INITIAL  4.  Patient will be able to get back to his walking program for exercise. Baseline:  Goal status: INITIAL    PLAN:  PT FREQUENCY: 1-2x/week  PT DURATION: 8 weeks  PLANNED INTERVENTIONS: Therapeutic exercises, Therapeutic activity, Neuromuscular re-education, Balance training, Gait training, Patient/Family education, Self Care, Joint mobilization, Joint  manipulation, Aquatic Therapy, Dry Needling, Electrical stimulation, Spinal mobilization, Moist heat, and Ultrasound.  PLAN FOR NEXT SESSION: Consider TUG, 30 second balance test, 87m  walk test, 5 sit to stand. Consider HEP for quad and hamstring stretches to increase ROM. Consider core strengthening. In the pool work on gait training and postural correction    During this treatment session, the therapist was present, participating in and directing the treatment.   Orphia Mctigue (Frankie) Jamese Trauger MPT 02/26/2023, 11:24 AM

## 2023-02-26 NOTE — Telephone Encounter (Signed)
Transition Care Management Unsuccessful Follow-up Telephone Call  Date of discharge and from where:  02/20/2023 [facility]   Attempts:  1st Attempt  Reason for unsuccessful TCM follow-up call:  Unable to reach patient Patient declined call   Lenard Forth [facility] Guide, [facility] [phone] [address] [place], [place], [place] [address] Phone: [phone] Email: [email or web address]

## 2023-02-28 ENCOUNTER — Encounter (HOSPITAL_BASED_OUTPATIENT_CLINIC_OR_DEPARTMENT_OTHER): Payer: Self-pay | Admitting: Physical Therapy

## 2023-02-28 ENCOUNTER — Ambulatory Visit (HOSPITAL_BASED_OUTPATIENT_CLINIC_OR_DEPARTMENT_OTHER): Payer: Medicare Other | Attending: Family Medicine | Admitting: Physical Therapy

## 2023-02-28 DIAGNOSIS — M62830 Muscle spasm of back: Secondary | ICD-10-CM | POA: Diagnosis not present

## 2023-02-28 DIAGNOSIS — R293 Abnormal posture: Secondary | ICD-10-CM | POA: Diagnosis not present

## 2023-02-28 DIAGNOSIS — M5459 Other low back pain: Secondary | ICD-10-CM | POA: Insufficient documentation

## 2023-02-28 DIAGNOSIS — M6281 Muscle weakness (generalized): Secondary | ICD-10-CM | POA: Diagnosis not present

## 2023-02-28 NOTE — Therapy (Addendum)
OUTPATIENT PHYSICAL THERAPY Michael [name] /Discharge    Patient Name: Michael [name] MRN: [id number] DOB:August 03, 1956, 67 y.o., male Today's Date: 02/26/2023  END OF SESSION:  PT End of Session - 02/26/23 1041     Visit Number 3    Number of Visits 16    Date for PT Re-Evaluation 03/27/23    PT Start Time 1031    PT Stop Time 1114    PT Time Calculation (min) 43 min    Activity Tolerance Patient tolerated treatment well    Behavior During Therapy WFL for tasks assessed/performed              Past Medical History:  Diagnosis Date   Hyperlipidemia    Hypertension    Lumbar disc disease    Prostate cancer (HCC)    Past Surgical History:  Procedure Laterality Date   LUMBAR DISC SURGERY  2014   LUMBAR DISC SURGERY     right ankle fracture  1978   external pinning   Patient Active Problem List   Diagnosis Date Noted   Left-sided chest wall pain 01/21/2023   Left flank pain 01/21/2023   DDD (degenerative disc disease), lumbar 12/21/2022   Prostate cancer (HCC); T1c; GG 1; initial diagnosis 2011 12/05/2022   Chronic hyperglycemia 11/16/2022   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   Contusion of ulnar nerve, initial encounter 11/09/2022   Dyslipidemia, goal LDL below 70 09/23/2022   Elevated PSA, between 10 and less than 20 ng/ml 09/17/2022   Accelerated hypertension 09/17/2022   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   Vitamin D deficiency 09/07/2021    PCP: Sanda Linger, MD  REFERRING PROVIDER: Antoine Primas, DO  REFERRING DIAG: [phone] (ICD-10-CM) - Lumbar radiculopathy   Rationale for Evaluation and Treatment: Rehabilitation  THERAPY DIAG:  Muscle spasm of back  Other low back pain  Muscle weakness (generalized)  Abnormal posture  ONSET DATE: March 2024  SUBJECTIVE:                                                                                                                                                                                            SUBJECTIVE STATEMENT: Patient feels like the pool may be helping.  He continues to have pain and stiffness when he first stands up.  He reports that it takes him several seconds to be able to get going when he first stands up.   Initial subjective Patient exprience spasm in his lower back and thigh region on the left side. Patient reports using a cane here and there to help ambulate around the community. Patient has experience 3 falls with one being recent  within the last few weeks. Patient reports that his balance has decreased over the past couple of months. He caught his foot on the ground in his most recent fall. He suffered bruised ribs. His x-rays were negative. He had physical therapy in the past with some improvement.   PERTINENT HISTORY:  Left sided chest wall pain, DDD lumbar, hypertension, prostate cancer   PAIN:  Are you having pain? Yes: NPRS scale: 3/10 Pain location: left lumbar region/thigh Pain description: hamstring tightness  Aggravating factors: sitting for 20 mins Relieving factors: gabapentin  PRECAUTIONS: Falls  WEIGHT BEARING RESTRICTIONS: No  FALLS:  Has patient fallen in last 6 months? Yes. Number of falls 3  LIVING ENVIRONMENT: Lives with: lives with their family Lives in: House/apartment Stairs: Yes: External: 2 steps; none Has following equipment at home: Single point cane, use time to time   OCCUPATION: retired  PLOF: Independent  PATIENT GOALS:  Be able to bend to tie shoes, get back to walking for exercise and pain free  NEXT MD VISIT: none  OBJECTIVE:   DIAGNOSTIC FINDINGS:  Patient hamstring are tight, unable to stand upright, gait velocity is slow,  PATIENT SURVEYS:  FOTO    SCREENING FOR RED FLAGS: Bowel or bladder incontinence: No Spinal tumors: No Cauda equina syndrome: No Compression fracture: lumbar disc surgery Abdominal aneurysm: No  COGNITION: Overall cognitive status: Within functional limits for tasks  assessed     SENSATION: Patient reports no Numbness/tingling  MUSCLE LENGTH: Hamstrings: Right 55 deg from full extension; Left 35 deg from full extension  POSTURE: forward head and flexed trunk   PALPATION: TTP low back and left gluteal region  Balance: narrow base CGA  Narrow base eyes closed CGA  Tandem left and right CGA   LUMBAR ROM:   AROM eval  Flexion   Extension Stands in in 15 degrees of flexion   Right lateral flexion    Left lateral flexion    Right rotation 50% limited  Left rotation 50% limited   (Blank rows = not tested)  LOWER EXTREMITY ROM:     Passive  Right eval Left eval  Hip flexion    Hip extension    Hip abduction    Hip adduction    Hip internal rotation    Hip external rotation    Knee flexion    Knee extension    Ankle dorsiflexion    Ankle plantarflexion    Ankle inversion    Ankle eversion     (Blank rows = not tested)  LOWER EXTREMITY MMT:    MMT Right eval Left eval  Hip flexion 16.1 15.2  Hip extension    Hip abduction 27.6 35  Hip adduction    Hip internal rotation    Hip external rotation    Knee flexion    Knee extension 38.7 55.6  Ankle dorsiflexion 43.5 44.9  Ankle plantarflexion    Ankle inversion    Ankle eversion     (Blank rows = not tested)  LUMBAR SPECIAL TESTS:  Checked his hamstring length   FUNCTIONAL TESTS:   Hamstring length: 90/90 40 degrees from straight bilateral   GAIT:  Assistive device utilized: Single point cane as needed. Level of assistance: Complete Independence Comments:   TODAY'S TREATMENT:  5/2  Manual: trigger point release to QL and lumbar paraspinals; General spine PA from T5 to L3. LAD in side lying Grade III and Grade IV.  Roller to [employer] stretch right 2x60 se hold Hamstring stretch 2x30 sec holfd   Door reach 2x20 sec hold right  side   Row green 2 x 15 with cueing for posture Shoulder extension 2 x 15 with cueing for posture     Last visit:  Pt seen for aquatic therapy today.  Treatment took place in water 3.5-4.75 ft in depth at the [facility] pool. Temp of water was 91.  Pt entered/exited the pool via stairs using step to pattern with hand rail.  *unsupported walking forward around pool then backward and side stepping 4.8.  Cues for gait  *Standing UE support on wall: hip circles cw&ccw (with difficulty); high knee marching; add/abd; hip flex; hip IR/ER. VC for erect posture and proper execution *Standing 3.6 ft: L stretch x 3 added tail wagging; adductor stretch; "old man stretch" *plank on 3rd step with hip extension for ant core and LB stretch *Core engagement: yellow hand buoy carry forward and back x 4 widths *right QL stretching *KB press with isometric hold x 8.  Pt requires the buoyancy and hydrostatic pressure of water for support, and to offload joints by unweighting joint load by at least 50 % in navel deep water and by at least 75-80% in chest to neck deep water.  Viscosity of the water is needed for resistance of strengthening. Water current perturbations provides challenge to standing balance requiring increased core activation.    PATIENT EDUCATION:  Education details:  Person educated: Patient Education method: Solicitor, Actor cues, Verbal cues, and Handouts Education comprehension: verbalized understanding, returned demonstration, verbal cues required, tactile cues required, and needs further education  HOME EXERCISE PROGRAM: Unable to provide 2nd to time   ASSESSMENT:  CLINICAL IMPRESSION: Patient has significant spasming in his lumbar paraspinals particularly on the right side.  He presents with likely scoliosis although he reports that his imaging is found no scoliosis.  He believes his tightness is coming from some type of past liver issue.  He  responded well to manual therapy.  He was able to stand straighter following therapy.  He is also able to stand quicker and take steps without as much time.  He was given stretches that he do for home including the [facility] stretch, hamstring stretch, and right-sided isolated doorway stretch.  He was advised to do these 2-3 times a day to maintain the benefits of manual therapy.  He has been very compliant in the past with his stretches and exercises.  We also gave him a posterior chain series of exercises to work on home as well as bands.  Therapy will continue to progress as tolerated.  Patient is a 67 y.o. male who was seen today for physical therapy evaluation and treatment for low back muscle spasm and left thigh pain. Patient has tight hamstring, is unable to stand up right, bend over to tight his shoes and slow gait speed. Patient would benefit from skilled therapy to increase ROM, strength, balance and upright posture in order to perform ADL's without  pain and prevent future falls.  OBJECTIVE IMPAIRMENTS: Abnormal gait, decreased activity tolerance, decreased balance, decreased endurance, difficulty walking, decreased ROM, and decreased strength. Pain   ACTIVITY LIMITATIONS: carrying, lifting, bending, sitting, standing, squatting, and stairs  PARTICIPATION LIMITATIONS: meal prep, laundry, shopping, community activity, and yard  work  PERSONAL FACTORS: 3+ comorbidities: , lumbar DDD, left sided wall pain,prostate cancer  are also affecting patient's functional outcome.   REHAB POTENTIAL: Excellent  CLINICAL DECISION MAKING: evolving: increased falls and decreased mobility   EVALUATION COMPLEXITY: Moderate   GOALS: Goals reviewed with patient? Yes  SHORT TERM GOALS: Target date: 02/12/23  Patient will increase right side hamstring ROM by 10 degrees. Baseline: Goal status: ongoing 02/26/23  2.  Patient will increase rotation by 25% bilaterally.  Baseline:  Goal status: ongoing  02/26/23  3.  Patient will be able to bend down without any discomfort or increase of pain.  Baseline:  Goal status: INITIAL  4.  Patient will be independent with his HEP. Baseline:  Goal status: ongoing 02/26/23  LONG TERM GOALS: Target date: 03/05/23  Patient will be able to walk community distances without significantly increase pain. Baseline:  Goal status: INITIAL  2.  Patient will be able to stand for greater than 15 minutes without significantly increase pain to perform ADL's. Baseline:  Goal status: INITIAL  3.  Patient will be able to sit down for greater than  30 minutes without back muscle spasm.  Baseline:  Goal status: INITIAL  4.  Patient will be able to get back to his walking program for exercise. Baseline:  Goal status: INITIAL    PLAN:  PT FREQUENCY: 1-2x/week  PT DURATION: 8 weeks  PLANNED INTERVENTIONS: Therapeutic exercises, Therapeutic activity, Neuromuscular re-education, Balance training, Gait training, Patient/Family education, Self Care, Joint mobilization, Joint manipulation, Aquatic Therapy, Dry Needling, Electrical stimulation, Spinal mobilization, Moist heat, and Ultrasound.  PLAN FOR NEXT SESSION: Consider TUG, 30 second balance test, 50m  walk test, 5 sit to stand. Consider HEP for quad and hamstring stretches to increase ROM. Consider core strengthening. In the pool work on gait training and postural correction    During this treatment session, the therapist was present, participating in and directing the treatment.   PHYSICAL THERAPY DISCHARGE SUMMARY  Visits from Start of Care: 3  Current functional level related to goals / functional outcomes: No improvement    Remaining deficits: Did not return unknown    Education / Equipment: Base HEP   Patient agrees to discharge. Patient goals were met. Patient is being discharged due to meeting the stated rehab goals.  Lorayne Bender, DPT 02/26/2023, 11:24 AM

## 2023-03-01 ENCOUNTER — Encounter (HOSPITAL_BASED_OUTPATIENT_CLINIC_OR_DEPARTMENT_OTHER): Payer: Self-pay | Admitting: Physical Therapy

## 2023-03-04 ENCOUNTER — Encounter: Payer: Self-pay | Admitting: Internal Medicine

## 2023-03-05 ENCOUNTER — Encounter (HOSPITAL_BASED_OUTPATIENT_CLINIC_OR_DEPARTMENT_OTHER): Payer: Self-pay

## 2023-03-05 ENCOUNTER — Ambulatory Visit (HOSPITAL_BASED_OUTPATIENT_CLINIC_OR_DEPARTMENT_OTHER): Payer: Medicare Other | Admitting: Physical Therapy

## 2023-03-08 ENCOUNTER — Ambulatory Visit (HOSPITAL_BASED_OUTPATIENT_CLINIC_OR_DEPARTMENT_OTHER): Payer: Medicare Other | Admitting: Physical Therapy

## 2023-03-08 NOTE — Telephone Encounter (Signed)
Patient called back and is extremely upset because he has not received a reply to the message he sent you on the 7th.

## 2023-03-12 ENCOUNTER — Ambulatory Visit (HOSPITAL_BASED_OUTPATIENT_CLINIC_OR_DEPARTMENT_OTHER): Payer: Medicare Other | Admitting: Physical Therapy

## 2023-03-12 ENCOUNTER — Other Ambulatory Visit: Payer: Self-pay | Admitting: Internal Medicine

## 2023-03-12 NOTE — Telephone Encounter (Signed)
He has been diagnosed with prostate cancer. When I last saw him he had a conversion reaction in the office.

## 2023-03-15 ENCOUNTER — Other Ambulatory Visit: Payer: Self-pay | Admitting: Internal Medicine

## 2023-03-15 ENCOUNTER — Ambulatory Visit (HOSPITAL_BASED_OUTPATIENT_CLINIC_OR_DEPARTMENT_OTHER): Payer: Medicare Other | Admitting: Physical Therapy

## 2023-03-15 DIAGNOSIS — R270 Ataxia, unspecified: Secondary | ICD-10-CM | POA: Insufficient documentation

## 2023-03-19 ENCOUNTER — Ambulatory Visit (HOSPITAL_BASED_OUTPATIENT_CLINIC_OR_DEPARTMENT_OTHER): Payer: Medicare Other | Admitting: Physical Therapy

## 2023-03-21 ENCOUNTER — Other Ambulatory Visit: Payer: Self-pay

## 2023-03-21 DIAGNOSIS — R0609 Other forms of dyspnea: Secondary | ICD-10-CM

## 2023-03-21 DIAGNOSIS — I493 Ventricular premature depolarization: Secondary | ICD-10-CM

## 2023-03-22 ENCOUNTER — Ambulatory Visit (HOSPITAL_BASED_OUTPATIENT_CLINIC_OR_DEPARTMENT_OTHER): Payer: Medicare Other | Admitting: Physical Therapy

## 2023-03-26 ENCOUNTER — Ambulatory Visit (HOSPITAL_BASED_OUTPATIENT_CLINIC_OR_DEPARTMENT_OTHER): Payer: Medicare Other | Admitting: Physical Therapy

## 2023-03-29 ENCOUNTER — Encounter (HOSPITAL_BASED_OUTPATIENT_CLINIC_OR_DEPARTMENT_OTHER): Payer: Medicare Other | Admitting: Physical Therapy

## 2023-04-03 ENCOUNTER — Encounter: Payer: Self-pay | Admitting: Urology

## 2023-04-08 ENCOUNTER — Other Ambulatory Visit: Payer: Self-pay | Admitting: Family Medicine

## 2023-04-09 ENCOUNTER — Encounter: Payer: Self-pay | Admitting: Neurology

## 2023-04-19 NOTE — Progress Notes (Signed)
Initial neurology clinic note  Reason for Evaluation: Consultation requested by [name], MD for an opinion regarding imbalance, leg spasms. My final recommendations will be communicated back to the requesting physician by way of shared medical record or letter to requesting physician via [place] mail.  HPI: This is Mr. Michael [name], a 67 y.o. right-handed male with a medical history of degenerative disc disease (followed by [facility]) s/p L5-S1 surgery, HLD, HTN, prostate cancer who presents to neurology clinic with the chief complaint of imbalance and leg spasms. The patient is alone today.  Patient's symptoms started in 2020. He was having abdominal pain and a colonscopy for this. He mentioned that he noticed symptoms in core with weakness (not able to sit up well). Sneezing and coughing was painful as well. He then noticed problems in his legs a year later. While sitting, his legs freeze. He cannot stand well. He has to "feel his legs under him" and start with small steps. Once he gets going, he feels he can move better. He mentions that is someone sneaks up on her or scares him, he will freeze up again. He walks with a cane. He has fallen 3 times since the beginning of 2024. He last feel about 1 week ago while trying to do lawn work. He was holding on the the mower for stability. He saw an insect that scared him, his left leg locked, then he fell.   He endorses some numbness and soreness in left hamstring area. He endorses muscle spasms (mostly in the left leg) usually when sitting. He denies twitching. He denies changes in bowel or bladder, including incontinence. He denies any symptoms in his upper extremities.  Patient had an EMG on 04/20/22 showed residuals of L5-S1 radiculopathy per report.  Patient went to ED for symptoms on 02/20/23. Electrolytes and CK were unremarkable.  Patient is on baclofen 5 mg at bedtime (has been weaning himself off of this) and gabapentin  300 mg TID (only taking BID), but has not taken anything in 4 days in preparation for this appointment. These medications do help (gabapentin more).  Any change in urine color, especially after exertion/physical activity? No  The patient denies symptoms suggestive of oculobulbar weakness including diplopia, ptosis, dysphagia, poor saliva control, dysarthria/dysphonia, impaired mastication, facial weakness/droop. He does mention that when he swallows, he feels like he is swallowing a bubble and will cause gas.  There are no neuromuscular respiratory weakness symptoms, particularly orthopnea>dyspnea.   Pseudobulbar affect is absent.  The patient has not noticed any recent skin rashes nor does he report any constitutional symptoms like fever, night sweats, anorexia or unintentional weight loss.  EtOH use: None  Restrictive diet? Plant based diet Family history of neuropathy/myopathy/NM disease? No  Of note, there is note in patient's chart about prostate cancer. This is just being monitored currently per patient. Per patient, he does think he has cancer.  Mother has moved in with patient to help him out.   MEDICATIONS:  Outpatient Encounter Medications as of 05/03/2023  Medication Sig   Baclofen 5 MG TABS Take 5 mg by mouth at bedtime.   gabapentin (NEURONTIN) 300 MG capsule TAKE 1 CAPSULE(300 MG) BY MOUTH THREE TIMES DAILY   GARLIC PO Take by mouth daily.   hydrALAZINE (APRESOLINE) 25 MG tablet Take 1 tablet (25 mg total) by mouth 3 (three) times daily.   triamterene-hydrochlorothiazide (DYAZIDE) 37.5-25 MG capsule Take 1 each (1 capsule total) by mouth daily.   [  DISCONTINUED] atorvastatin (LIPITOR) 40 MG tablet Take 1 tablet (40 mg total) by mouth daily. (Patient not taking: Reported on 01/29/2023)   [DISCONTINUED] traMADol (ULTRAM) 50 MG tablet Take 1 tablet (50 mg total) by mouth every 6 (six) hours as needed.   No facility-administered encounter medications on file as of 05/03/2023.     PAST MEDICAL HISTORY: Past Medical History:  Diagnosis Date   Hyperlipidemia    Hypertension    Lumbar disc disease    Prostate cancer (HCC)     PAST SURGICAL HISTORY: Past Surgical History:  Procedure Laterality Date   LUMBAR DISC SURGERY  2014   LUMBAR DISC SURGERY     right ankle fracture  1978   external pinning    ALLERGIES: No Known Allergies  FAMILY HISTORY: Family History  Problem Relation Age of Onset   High blood pressure Mother    Prostate cancer Father     SOCIAL HISTORY: Social History   Tobacco Use   Smoking status: Never    Passive exposure: Never   Smokeless tobacco: Never  Vaping Use   Vaping Use: Never used  Substance Use Topics   Alcohol use: Not Currently    Comment: rare   Drug use: No   Social History   Social History Narrative   Right Handed    Lives in a two story home.     OBJECTIVE: PHYSICAL EXAM: BP (!) 154/86   Pulse 78   Ht 6' (1.829 m)   Wt 240 lb (108.9 kg)   SpO2 97%   BMI 32.55 kg/m   General: General appearance: Awake and alert. No distress. Cooperative with exam.  Skin: No obvious rash or jaundice. HEENT: Atraumatic. Anicteric. Temporal wasting Lungs: Non-labored breathing on room air  Chest/Abdomen: Gynecomastia. Soft, non tender. Extremities: No edema. No obvious deformity.  Musculoskeletal: No obvious joint swelling. Psych: Affect appropriate.  Neurological: Mental Status: Alert. Speech fluent. No pseudobulbar affect Cranial Nerves: CNII: No RAPD. Visual fields grossly intact. CNIII, IV, VI: PERRL. No nystagmus. EOMI. CN V: Facial sensation intact bilaterally to fine touch. [place]eter clench strong. Jaw jerk negative. CN VII: Facial muscles symmetric and strong. No ptosis at rest. CN VIII: Hearing grossly intact bilaterally. CN IX: No hypophonia. CN X: Palate elevates symmetrically. CN XI: Full strength shoulder shrug bilaterally. CN XII: Tongue protrusion full and midline. No atrophy. ?tongue  fasciculations. No significant dysarthria Motor: Tone is normal. No clear fasciculations in extremities. Some twitching in neck, but appears to be arterial No obvious atrophy. No grip or percussive myotonia.  Individual muscle group testing (MRC grade out of 5):  Movement     Neck flexion 5    Neck extension 5     Right Left   Shoulder abduction 5 5   Shoulder adduction 5 5   Shoulder ext rotation 5 5   Shoulder int rotation 5 5   Elbow flexion 5 5   Elbow extension 5 5   Wrist extension 5 5   Wrist flexion 5 5   Finger abduction - FDI 5 5   Finger abduction - ADM 5 5   Finger extension 5 5   Finger distal flexion - 2/3 5 5    Finger distal flexion - 4/5 5 5    Thumb flexion - FPL 5 5   Thumb abduction - APB 5 5    Hip flexion 4 4-   Hip extension 5 5   Hip adduction 5- 5-   Hip abduction 5 5  Knee extension 5 5   Knee flexion 5 5-   Dorsiflexion 5 5   Plantarflexion 5 5   Inversion 5 5   Eversion 5 5   Great toe extension 5 5    Abdominal flexion and extension are limited and weak.  Reflexes:  Right Left   Bicep 2+ 2+   Tricep 2+ 2+   BrRad 2+ 2+   Knee 3+ 3+ Cross adductors, spread to abdomen?  Ankle 0 0    Pathological Reflexes: Babinski: mute response bilaterally Hoffman: absent bilaterally Troemner: absent bilaterally Pectoral: absent bilaterally Palmomental: absent bilaterally Facial: absent bilaterally Midline tap: absent Sensation: Pinprick: Intact in all extremities Coordination: Intact finger-to- nose-finger bilaterally.  Gait: Unable to rise from chair with arms crossed unassisted. Wide-based, very unsteady gait. Very slow to start walking, improves after standing for a couple of minutes. Walks with cane. No en bloc turns.  Lab and Test Review: Internal labs: 02/20/23: CK 115 CBC significant for MCV of 65.9 (chronic) CMP unremarkable  11/22/22: PSA: 10.42 Lipid panel significant for LDL of 116 TSH 5.02 (wnl) ZOX0R: 5.9  04/05/22: ANA  positive at 1:40 ACE wnl Ionized Ca wnl PTH wnl Vit D: 20.42 CRP < 1.0 ESR wnl (15) CCP negative RF negative B12: 1303 Ferritin 25.7  Imaging: CT lumbar spine and myelogram (11/02/22): FINDINGS: LUMBAR MYELOGRAM FINDINGS:   Unchanged trace retrolisthesis at T12-L1 and L1-L2. No dynamic instability. Small ventral extradural defects from T12-L1 through L4-L5. No spinal canal stenosis or nerve root effacement.   CT LUMBAR MYELOGRAM FINDINGS:   Segmentation: Standard.   Alignment: Unchanged mild levocurvature. Unchanged trace retrolisthesis at T12-L1 and L1-L2.   Vertebrae: No acute fracture or other focal pathologic process. Small L3 bone island.   Conus medullaris and cauda equina: Conus extends to the T12-L1 level. Conus and cauda equina appear normal.   Paraspinal and other soft tissues: Aortoiliac atherosclerotic vascular disease.   Disc levels:   T12-L1: Unchanged small broad-based posterior disc osteophyte complex and mild left facet arthropathy. No stenosis.   L1-L2: Unchanged small shallow right paracentral disc osteophyte complex and mild bilateral facet arthropathy. No stenosis.   L2-L3: Unchanged small broad-based posterior disc osteophyte complex and mild bilateral facet arthropathy. Unchanged mild right neuroforaminal stenosis. No spinal canal or left neuroforaminal stenosis.   L3-L4: Unchanged small broad-based posterior disc osteophyte complex and bilateral facet arthropathy. Unchanged mild bilateral neuroforaminal stenosis. No spinal canal stenosis.   L4-L5: Unchanged small broad-based posterior disc osteophyte complex and mild bilateral facet arthropathy. No stenosis.   L5-S1: Prior left hemilaminectomy. Unchanged tiny broad-based posterior disc protrusion and mild-to-moderate bilateral facet arthropathy. No stenosis.   IMPRESSION: 1. Unchanged mild multilevel lumbar spondylosis as described above. No high-grade stenosis or impingement. 2.  Unchanged trace retrolisthesis at T12-L1 and L1-L2. No dynamic instability. 3.  Aortic Atherosclerosis (ICD10-I70.0).  MRI lumbar spine wo contrast (09/30/2010): IMPRESSION:  1. Probable left S1 nerve root encroachment at L5-S1, felt to be  due to a combination of a posterolateral disc protrusion and a  probable synovial cyst extending medially from the left facet  joint.  This seems to be the most likely explanation for the  patient's symptoms.  Correlation with level of radicular symptoms  is recommended.  2.  Mild disc bulging and facet disease at the additional levels  with mild inferior foraminal narrowing as detailed above.  There is  the potential for extraforaminal nerve root encroachment on the  left at L2-L3, not definite.  No  other significant spinal stenosis  is demonstrated.   EMG (04/20/22 by Dr. Wynn Banker):     ASSESSMENT: Moiz Birman is a 67 y.o. male who presents for evaluation of abdominal and leg muscle weakness, muscle spasms, and imbalance and falls. He has a relevant medical history of degenerative disc disease (followed by [facility]) s/p L5-S1 surgery, HLD, HTN, prostate cancer. His neurological examination is pertinent for weakness in core muscles and proximal leg muscles (left > right). There is temporal wasting and gynecomastia and possible tongue fasciculations as well. Patient has great difficulty standing and is very slow to straighten legs. He has a wide based gait and is very unsteady, requiring help from me today to get into and out of a gown for examination. Available diagnostic data is significant for EMG in 2023 showing evidence of a left L5-S1 radiculopathy. CT lumbar spine did not show significant stenosis in 10/2022. The etiology of patient's symptoms is currently unclear, but I am concerned for a neuromuscular etiology with a wide differential that includes lumbosacral radiculopathy, myopathy, myotonic dystrophy, or motor neuron disease  (?Kennedy's disease) all being possible. I will investigate further as below.  PLAN: -Blood work: CK, B1, B12, IFE, copper, vit E -EMG: LLE > RLE; will include thoracic paraspinals if able (prior back surgery) and may include upper extremities if concern for MND (05/14/23 at 11 am) -Will consider further MRI of spine or LP pending EMG results -Can continue gabapentin 300 mg BID or TID -Can continue baclofen if needed, but this could also contribute to muscle weakness  -Return to clinic in 3 months  The impression above as well as the plan as outlined below were extensively discussed with the patient who voiced understanding. All questions were answered to their satisfaction.  The patient was counseled on pertinent fall precautions per the printed material provided today, and as noted under the "Patient Instructions" section below.  When available, results of the above investigations and possible further recommendations will be communicated to the patient via telephone/MyChart. Patient to call office if not contacted after expected testing turnaround time.   Total time spent reviewing records, interview, history/exam, documentation, and coordination of care on day of encounter:  70 min   Thank you for allowing me to participate in patient's care.  If I can answer any additional questions, I would be pleased to do so.  Jacquelyne Balint, MD   CC: [name], MD [address] [place] [address]  CC: Referring provider: [name], MD [address] [place],  [place] [address]

## 2023-04-25 ENCOUNTER — Ambulatory Visit (HOSPITAL_COMMUNITY): Payer: Medicare Other | Attending: Internal Medicine

## 2023-04-25 DIAGNOSIS — I493 Ventricular premature depolarization: Secondary | ICD-10-CM

## 2023-04-25 DIAGNOSIS — R0609 Other forms of dyspnea: Secondary | ICD-10-CM

## 2023-04-25 LAB — ECHOCARDIOGRAM COMPLETE
Area-P 1/2: 1.81 cm2
S' Lateral: 4.3 cm

## 2023-05-03 ENCOUNTER — Ambulatory Visit (INDEPENDENT_AMBULATORY_CARE_PROVIDER_SITE_OTHER): Payer: Medicare Other | Admitting: Neurology

## 2023-05-03 ENCOUNTER — Other Ambulatory Visit (INDEPENDENT_AMBULATORY_CARE_PROVIDER_SITE_OTHER): Payer: Medicare Other

## 2023-05-03 ENCOUNTER — Encounter: Payer: Self-pay | Admitting: Neurology

## 2023-05-03 VITALS — BP 154/86 | HR 78 | Ht 72.0 in | Wt 240.0 lb

## 2023-05-03 DIAGNOSIS — R198 Other specified symptoms and signs involving the digestive system and abdomen: Secondary | ICD-10-CM | POA: Diagnosis not present

## 2023-05-03 DIAGNOSIS — R2689 Other abnormalities of gait and mobility: Secondary | ICD-10-CM

## 2023-05-03 DIAGNOSIS — M62838 Other muscle spasm: Secondary | ICD-10-CM

## 2023-05-03 DIAGNOSIS — R29898 Other symptoms and signs involving the musculoskeletal system: Secondary | ICD-10-CM

## 2023-05-03 DIAGNOSIS — R269 Unspecified abnormalities of gait and mobility: Secondary | ICD-10-CM | POA: Diagnosis not present

## 2023-05-03 DIAGNOSIS — W19XXXS Unspecified fall, sequela: Secondary | ICD-10-CM

## 2023-05-03 LAB — CK: Total CK: 130 U/L (ref 7–232)

## 2023-05-03 LAB — EXTRA SPECIMEN

## 2023-05-03 LAB — VITAMIN B12: Vitamin B-12: 879 pg/mL (ref 211–911)

## 2023-05-03 NOTE — Patient Instructions (Signed)
I am not sure the cause of your leg weakness. I would like to investigate with the following: -Blood work today -Repeat your EMG - will schedule on 05/14/23 at 11 am so we have a lot of time to do your testing  I will be in touch regarding next steps after this testing.  You can continue gabapentin 300 mg twice or three times daily.  Be careful with baclofen as this can make you weaker.  I would like to see you again in clinic in about 3 months.  The physicians and staff at [facility] are committed to providing excellent care. You may receive a survey requesting feedback about your experience at our office. We strive to receive "very good" responses to the survey questions. If you feel that your experience would prevent you from giving the office a "very good " response, please contact our office to try to remedy the situation. We may be reached at [phone]. Thank you for taking the time out of your busy day to complete the survey.  Jacquelyne Balint, MD [facility]  ELECTROMYOGRAM AND NERVE CONDUCTION STUDIES (EMG/NCS) INSTRUCTIONS  How to Prepare The neurologist conducting the EMG will need to know if you have certain medical conditions. Tell the neurologist and other EMG lab personnel if you: Have a pacemaker or any other electrical medical device Take blood-thinning medications Have hemophilia, a blood-clotting disorder that causes prolonged bleeding Bathing Take a shower or bath shortly before your exam in order to remove oils from your skin. Don't apply lotions or creams before the exam.  What to Expect You'll likely be asked to change into a hospital gown for the procedure and lie down on an examination table. The following explanations can help you understand what will happen during the exam.  Electrodes. The neurologist or a technician places surface electrodes at various locations on your skin depending on where you're experiencing symptoms. Or the neurologist may  insert needle electrodes at different sites depending on your symptoms.  Sensations. The electrodes will at times transmit a tiny electrical current that you may feel as a twinge or spasm. The needle electrode may cause discomfort or pain that usually ends shortly after the needle is removed. If you are concerned about discomfort or pain, you may want to talk to the neurologist about taking a short break during the exam.  Instructions. During the needle EMG, the neurologist will assess whether there is any spontaneous electrical activity when the muscle is at rest - activity that isn't present in healthy muscle tissue - and the degree of activity when you slightly contract the muscle.  He or she will give you instructions on resting and contracting a muscle at appropriate times. Depending on what muscles and nerves the neurologist is examining, he or she may ask you to change positions during the exam.  After your EMG You may experience some temporary, minor bruising where the needle electrode was inserted into your muscle. This bruising should fade within several days. If it persists, contact your primary care doctor.   Preventing Falls at [facility] are common, often dreaded events in the lives of older people. Aside from the obvious injuries and even death that may result, fall can cause wide-ranging consequences including loss of independence, mental decline, decreased activity and mobility. Younger people are also at risk of falling, especially those with chronic illnesses and fatigue.  Ways to reduce risk for falling Examine diet and medications. Warm foods and alcohol dilate blood  vessels, which can lead to dizziness when standing. Sleep aids, antidepressants and pain medications can also increase the likelihood of a fall.  Get a vision exam. Poor vision, cataracts and glaucoma increase the chances of falling.  Check foot gear. Shoes should fit snugly and have a sturdy, nonskid sole and a  broad, low heel  Participate in a physician-approved exercise program to build and maintain muscle strength and improve balance and coordination. Programs that use ankle weights or stretch bands are excellent for muscle-strengthening. Water aerobics programs and low-impact Tai Chi programs have also been shown to improve balance and coordination.  Increase vitamin D intake. Vitamin D improves muscle strength and increases the amount of calcium the body is able to absorb and deposit in bones.  How to prevent falls from common hazards Floors - Remove all loose wires, cords, and throw rugs. Minimize clutter. Make sure rugs are anchored and smooth. Keep furniture in its usual place.  Chairs -- Use chairs with straight backs, armrests and firm seats. Add firm cushions to existing pieces to add height.  Bathroom - Install grab bars and non-skid tape in the tub or shower. Use a bathtub transfer bench or a shower chair with a back support Use an elevated toilet seat and/or safety rails to assist standing from a low surface. Do not use towel racks or bathroom tissue holders to help you stand.  Lighting - Make sure halls, stairways, and entrances are well-lit. Install a night light in your bathroom or hallway. Make sure there is a light switch at the top and bottom of the staircase. Turn lights on if you get up in the middle of the night. Make sure lamps or light switches are within reach of the bed if you have to get up during the night.  Kitchen - Install non-skid rubber mats near the sink and stove. Clean spills immediately. Store frequently used utensils, pots, pans between waist and eye level. This helps prevent reaching and bending. Sit when getting things out of lower cupboards.  Living room/ Bedrooms - Place furniture with wide spaces in between, giving enough room to move around. Establish a route through the living room that gives you something to hold onto as you walk.  Stairs - Make sure treads,  rails, and rugs are secure. Install a rail on both sides of the stairs. If stairs are a threat, it might be helpful to arrange most of your activities on the lower level to reduce the number of times you must climb the stairs.  Entrances and doorways - Install metal handles on the walls adjacent to the doorknobs of all doors to make it more secure as you travel through the doorway.  Tips for maintaining balance Keep at least one hand free at all times. Try using a backpack or fanny pack to hold things rather than carrying them in your hands. Never carry objects in both hands when walking as this interferes with keeping your balance.  Attempt to swing both arms from front to back while walking. This might require a conscious effort if Parkinson's disease has diminished your movement. It will, however, help you to maintain balance and posture, and reduce fatigue.  Consciously lift your feet off of the ground when walking. Shuffling and dragging of the feet is a common culprit in losing your balance.  When trying to navigate turns, use a "U" technique of facing forward and making a wide turn, rather than pivoting sharply.  Try to stand with your  feet shoulder-length apart. When your feet are close together for any length of time, you increase your risk of losing your balance and falling.  Do one thing at a time. Don't try to walk and accomplish another task, such as reading or looking around. The decrease in your automatic reflexes complicates motor function, so the less distraction, the better.  Do not wear rubber or gripping soled shoes, they might "catch" on the floor and cause tripping.  Move slowly when changing positions. Use deliberate, concentrated movements and, if needed, use a grab bar or walking aid. Count 15 seconds between each movement. For example, when rising from a seated position, wait 15 seconds after standing to begin walking.  If balance is a continuous problem, you might want  to consider a walking aid such as a cane, walking stick, or walker. Once you've mastered walking with help, you might be ready to try it on your own again.

## 2023-05-06 LAB — VITAMIN B1: Vitamin B1 (Thiamine): 15 nmol/L (ref 8–30)

## 2023-05-06 LAB — EXTRA SPECIMEN

## 2023-05-08 LAB — IMMUNOFIXATION ELECTROPHORESIS: IgM, Serum: 57 mg/dL (ref 50–300)

## 2023-05-08 LAB — VITAMIN E: Gamma-Tocopherol (Vit E): 1.3 mg/L (ref <4.4)

## 2023-05-09 ENCOUNTER — Other Ambulatory Visit: Payer: Self-pay

## 2023-05-09 DIAGNOSIS — D892 Hypergammaglobulinemia, unspecified: Secondary | ICD-10-CM

## 2023-05-09 LAB — COPPER, SERUM

## 2023-05-09 LAB — IMMUNOFIXATION ELECTROPHORESIS
IgG (Immunoglobin G), Serum: 1696 mg/dL — ABNORMAL HIGH (ref 600–1540)
Immunoglobulin A: 294 mg/dL (ref 70–320)

## 2023-05-09 LAB — VITAMIN E: Vitamin E (Alpha Tocopherol): 13.4 mg/L (ref 5.7–19.9)

## 2023-05-14 ENCOUNTER — Ambulatory Visit (INDEPENDENT_AMBULATORY_CARE_PROVIDER_SITE_OTHER): Payer: Medicare Other | Admitting: Neurology

## 2023-05-14 ENCOUNTER — Other Ambulatory Visit (INDEPENDENT_AMBULATORY_CARE_PROVIDER_SITE_OTHER): Payer: Medicare Other

## 2023-05-14 DIAGNOSIS — R2689 Other abnormalities of gait and mobility: Secondary | ICD-10-CM | POA: Diagnosis not present

## 2023-05-14 DIAGNOSIS — R269 Unspecified abnormalities of gait and mobility: Secondary | ICD-10-CM

## 2023-05-14 DIAGNOSIS — D892 Hypergammaglobulinemia, unspecified: Secondary | ICD-10-CM

## 2023-05-14 DIAGNOSIS — M62838 Other muscle spasm: Secondary | ICD-10-CM

## 2023-05-14 DIAGNOSIS — R29898 Other symptoms and signs involving the musculoskeletal system: Secondary | ICD-10-CM

## 2023-05-14 DIAGNOSIS — G629 Polyneuropathy, unspecified: Secondary | ICD-10-CM

## 2023-05-14 DIAGNOSIS — R198 Other specified symptoms and signs involving the digestive system and abdomen: Secondary | ICD-10-CM

## 2023-05-14 DIAGNOSIS — W19XXXS Unspecified fall, sequela: Secondary | ICD-10-CM

## 2023-05-14 DIAGNOSIS — M5417 Radiculopathy, lumbosacral region: Secondary | ICD-10-CM

## 2023-05-14 NOTE — Procedures (Signed)
[facility]  [address] [place], [address]  [place], [place] [address] Tel: [phone] Fax: [phone] Test Date:  05/14/2023  Patient: Michael [name] DOB: [birth year] Physician: Jacquelyne Balint, MD  Sex: Male Height: 6\' 0"  Ref Phys: Jacquelyne Balint, MD  ID#: [id number]   Technician:    History: This is a 67 year old male with abdominal and leg weakness, muscle spasms, imbalance, and falls.  NCV & EMG Findings: Extensive electrodiagnostic evaluation of the left lower limb with additional nerve conduction studies of the left upper limb and needle examination of the left thoracic paraspinal muscles (T8 and T5 levels) shows: Left sural and superficial peroneal/fibular sensory responses are absent. Left ulnar sensory response shows reduced amplitude (4 V). Left median and radial sensory responses are within normal limits. Left peroneal/fibular (EDB) motor response shows reduced amplitude (1.31 mV). Left peroneal/fibular (TA), tibial (AH), median (APB), and ulnar (ADM) motor responses are within normal limits. Left H reflex is absent. Chronic motor axon loss changes are seen in the left medial head of gastrocnemius and left short head of biceps femoris muscles, with active denervation changes also seen in the medial head of gastrocnemius muscle. All muscles were difficult to relax, even with antagonist muscle contraction. Lumbosacral paraspinal muscles were deferred due to prior lumbosacral spine surgery.  Impression: This is an abnormal study. The findings are most consistent with the following: Evidence of an active/ongoing intraspinal canal lesion (ie: motor radiculopathy) at the left S1 root or segment, moderate in degree electrically. Evidence of a length dependent, large fiber sensorimotor neuropathy, axon loss in type, mild to moderate in degree electrically. No electrodiagnostic evidence of demyelinating neuropathy. No electrodiagnostic evidence of a left median mononeuropathy at or  distal to the wrist (ie: carpal tunnel syndrome).    ___________________________ Jacquelyne Balint, MD    Nerve Conduction Studies Motor Nerve Results    Latency Amplitude F-Lat Segment Distance CV Comment  Site (ms) Norm (mV) Norm (ms)  (cm) (m/s) Norm   Left Fibular (EDB) Motor  Ankle 5.5  < 6.0 *1.31  > 2.5        Bel fib head 13.8 - 1.16 -  Bel fib head-Ankle 38 46  > 40   Pop fossa 15.5 - 1.16 -  Pop fossa-Bel fib head 9 53 -   Left Fibular (TA) Motor  Fib head 2.2  < 4.5 3.9  > 3.0        Pop fossa 3.9  < 6.7 3.9 -  Pop fossa-Fib head 10 59  > 40   Left Median (APB) Motor  Wrist 2.5  < 4.0 8.5  > 5.0        Elbow 7.9 - 6.9 -  Elbow-Wrist 31 57  > 50   Left Tibial (AH) Motor  Ankle 5.6  < 6.0 5.7  > 4.0        Knee 16.4 - 4.5 -  Knee-Ankle 47 44  > 40   Left Ulnar (ADM) Motor  Wrist 1.95  < 3.1 8.0  > 7.0        Bel elbow 6.8 - 7.3 -  Bel elbow-Wrist 26 53  > 50   Ab elbow 8.7 - 7.0 -  Ab elbow-Bel elbow 10 53 -    Sensory Sites    Neg Peak Lat Amplitude (O-P) Segment Distance Velocity Comment  Site (ms) Norm (V) Norm  (cm) (ms)   Left Median Sensory  Wrist-Dig II 2.8  < 3.8 20  >  10 Wrist-Dig II 14    Left Radial Sensory  Forearm-Wrist 1.95  < 2.8 29  > 10 Forearm-Wrist 10    Left Superficial Fibular Sensory  14 cm-Ankle *NR  < 4.6 *NR  > 3 14 cm-Ankle 14    Left Sural Sensory  Calf-Lat mall *NR  < 4.6 *NR  > 3 Calf-Lat mall 14    Left Ulnar Sensory  Wrist-Dig V 2.8  < 3.2 *4  > 5 Wrist-Dig V 11     H-Reflex Results    M-Lat H Lat H Neg Amp H-M Lat  Site (ms) (ms) Norm (mV) (ms)  Left Tibial H-Reflex  Pop fossa 5.5 -  < 35.0 - -   Electromyography   Side Muscle Ins.Act Fibs Fasc Recrt Amp Dur Poly Activation Comment  Left Tib ant Nml Nml Nml Nml Nml Nml Nml Nml N/A  Left Gastroc MH Nml *3+ Nml *2- *2+ *2+ *2+ Nml N/A  Left FDL Nml Nml Nml Nml Nml Nml Nml Nml N/A  Left Vastus lat Nml Nml Nml Nml Nml Nml Nml Nml N/A  Left Rectus fem Nml Nml Nml Nml Nml Nml  Nml Nml N/A  Left Iliacus Nml Nml Nml Nml Nml Nml Nml Nml N/A  Left Biceps fem SH Nml Nml Nml *2- *1+ *1+ *1+ Nml N/A  Left Gluteus med Nml Nml Nml Nml Nml Nml Nml Nml N/A  Left T8 PSP Nml Nml Nml Nml Nml Nml Nml Nml N/A  Left T5 PSP Nml Nml Nml Nml Nml Nml Nml Nml N/A      Waveforms:  Motor             Sensory             H-Reflex

## 2023-05-15 ENCOUNTER — Encounter: Payer: Self-pay | Admitting: Neurology

## 2023-05-20 LAB — KAPPA/LAMBDA LIGHT CHAINS
Kappa free light chain: 25.4 mg/L — ABNORMAL HIGH (ref 3.3–19.4)
Kappa:Lambda Ratio: 1.79 — ABNORMAL HIGH (ref 0.26–1.65)
Lambda Free Lght Chn: 14.2 mg/L (ref 5.7–26.3)

## 2023-05-20 LAB — COPPER, SERUM: Copper: 131 ug/dL (ref 70–175)

## 2023-05-21 LAB — VEGF, SERUM: VEGF, Serum: 180 pg/mL (ref 62–707)

## 2023-05-28 NOTE — Progress Notes (Unsigned)
I saw Thawng Marchant in neurology clinic on 05/29/23 in follow up for imbalance, leg spasms.  HPI: Tijuan Sagel is a 67 y.o. year old right-handed male with a medical history of degenerative disc disease (followed by [facility]) s/p L5-S1 surgery, HLD, HTN, prostate cancer who we last saw on 05/03/23.  To briefly review: Patient's symptoms started in 2020. He was having abdominal pain and a colonscopy for this. He mentioned that he noticed symptoms in core with weakness (not able to sit up well). Sneezing and coughing was painful as well. He then noticed problems in his legs a year later. While sitting, his legs freeze. He cannot stand well. He has to "feel his legs under him" and start with small steps. Once he gets going, he feels he can move better. He mentions that is someone sneaks up on her or scares him, he will freeze up again. He walks with a cane. He has fallen 3 times since the beginning of 2024. He last feel about 1 week ago while trying to do lawn work. He was holding on the the mower for stability. He saw an insect that scared him, his left leg locked, then he fell.    He endorses some numbness and soreness in left hamstring area. He endorses muscle spasms (mostly in the left leg) usually when sitting. He denies twitching. He denies changes in bowel or bladder, including incontinence. He denies any symptoms in his upper extremities.   Patient had an EMG on 04/20/22 showed residuals of L5-S1 radiculopathy per report.   Patient went to ED for symptoms on 02/20/23. Electrolytes and CK were unremarkable.   Patient is on baclofen 5 mg at bedtime (has been weaning himself off of this) and gabapentin 300 mg TID (only taking BID), but has not taken anything in 4 days in preparation for this appointment. These medications do help (gabapentin more).   Any change in urine color, especially after exertion/physical activity? No   The patient denies symptoms suggestive of  oculobulbar weakness including diplopia, ptosis, dysphagia, poor saliva control, dysarthria/dysphonia, impaired mastication, facial weakness/droop. He does mention that when he swallows, he feels like he is swallowing a bubble and will cause gas.   There are no neuromuscular respiratory weakness symptoms, particularly orthopnea>dyspnea.    Pseudobulbar affect is absent.   The patient has not noticed any recent skin rashes nor does he report any constitutional symptoms like fever, night sweats, anorexia or unintentional weight loss.   EtOH use: None  Restrictive diet? Plant based diet Family history of neuropathy/myopathy/NM disease? No   Of note, there is note in patient's chart about prostate cancer. This is just being monitored currently per patient. Per patient, he does think he has cancer.   Mother has moved in with patient to help him out.  Most recent Assessment and Plan (05/03/23): Raylyn Drilon Ussery is a 66 y.o. male who presents for evaluation of abdominal and leg muscle weakness, muscle spasms, and imbalance and falls. He has a relevant medical history of degenerative disc disease (followed by [facility]) s/p L5-S1 surgery, HLD, HTN, prostate cancer. His neurological examination is pertinent for weakness in core muscles and proximal leg muscles (left > right). There is temporal wasting and gynecomastia and possible tongue fasciculations as well. Patient has great difficulty standing and is very slow to straighten legs. He has a wide based gait and is very unsteady, requiring help from me today to get into and  out of a gown for examination. Available diagnostic data is significant for EMG in 2023 showing evidence of a left L5-S1 radiculopathy. CT lumbar spine did not show significant stenosis in 10/2022. The etiology of patient's symptoms is currently unclear, but I am concerned for a neuromuscular etiology with a wide differential that includes lumbosacral radiculopathy,  myopathy, myotonic dystrophy, or motor neuron disease (?Kennedy's disease) all being possible. I will investigate further as below.   PLAN: -Blood work: CK, B1, B12, IFE, copper, vit E -EMG: LLE > RLE; will include thoracic paraspinals if able (prior back surgery) and may include upper extremities if concern for MND (05/14/23 at 11 am) -Will consider further MRI of spine or LP pending EMG results -Can continue gabapentin 300 mg BID or TID -Can continue baclofen if needed, but this could also contribute to muscle weakness  Since their last visit: Overall, patient is similar to prior. He had another fall in the last week.  Initial labs on 05/03/23 were significant for an IgG kappa monoclonal antibody. I referred patient to hematology for this. This consultation is scheduled 06/10/23.  EMG on 05/14/23 showed an active left S1 radiculopathy (similar to prior EMG), but I also noticed spasms or jerking whenever I would touch the patient. It was difficult for patient to relax any muscles, including with antagonist contraction (co-contraction suspected). There was no other significant abnormalities. Given this, I added on some labs including GAD65 due to a suspicion for stiff person syndrome. GAD65 antibodies were positive (> 250 with normal < 5).   Patient is taking gabapentin as needed. It makes him drowsy. Patient is not taking baclofen currently.   MEDICATIONS:  Outpatient Encounter Medications as of 05/29/2023  Medication Sig   Baclofen 5 MG TABS Take 5 mg by mouth at bedtime.   gabapentin (NEURONTIN) 300 MG capsule TAKE 1 CAPSULE(300 MG) BY MOUTH THREE TIMES DAILY   GARLIC PO Take by mouth daily.   hydrALAZINE (APRESOLINE) 25 MG tablet Take 1 tablet (25 mg total) by mouth 3 (three) times daily.   triamterene-hydrochlorothiazide (DYAZIDE) 37.5-25 MG capsule Take 1 each (1 capsule total) by mouth daily.   No facility-administered encounter medications on file as of 05/29/2023.    PAST MEDICAL  HISTORY: Past Medical History:  Diagnosis Date   Hyperlipidemia    Hypertension    Lumbar disc disease    Prostate cancer (HCC)     PAST SURGICAL HISTORY: Past Surgical History:  Procedure Laterality Date   LUMBAR DISC SURGERY  2014   LUMBAR DISC SURGERY     right ankle fracture  1978   external pinning    ALLERGIES: No Known Allergies  FAMILY HISTORY: Family History  Problem Relation Age of Onset   High blood pressure Mother    Prostate cancer Father     SOCIAL HISTORY: Social History   Tobacco Use   Smoking status: Never    Passive exposure: Never   Smokeless tobacco: Never  Vaping Use   Vaping status: Never Used  Substance Use Topics   Alcohol use: Not Currently    Comment: rare   Drug use: No   Social History   Social History Narrative   Right Handed    Lives in a two story home.   Are you right handed or left handed? Right    Are you currently employed ?    What is your current occupation? retired   Do you live at home alone? no   Who lives with you?  Mother   What type of home do you live in: 1 story or 2 story? two        Objective:  Vital Signs:  BP (!) 132/51   Pulse (!) 53   Ht 6\' 3"  (1.905 m)   Wt 241 lb 6.4 oz (109.5 kg)   SpO2 97%   BMI 30.17 kg/m   General: General appearance: Awake and alert. No distress. Cooperative with exam.  Skin: No obvious rash or jaundice. HEENT: Atraumatic. Anicteric. Temporal wasting. Lungs: Non-labored breathing on room air  Chest: Gynecomastia  Abdomen: Soft, non tender. Extremities: Significant edema in bilateral lower extremities. No obvious deformity.  Musculoskeletal: No obvious joint swelling.  Neurological: Mental Status: Alert. Speech fluent. No pseudobulbar affect Cranial Nerves: CNII: No RAPD. Visual fields intact. CNIII, IV, VI: PERRL. No nystagmus. EOMI. CN V: Facial sensation intact bilaterally to fine touch.  CN VII: Facial muscles symmetric and strong. No ptosis at rest. CN  VIII: Hears finger rub well bilaterally. CN IX: No hypophonia. CN X: Palate elevates symmetrically. CN XI: Full strength shoulder shrug bilaterally. CN XII: Tongue protrusion full and midline. No clear fasciculations today, occasional tremors. No significant dysarthria Motor: Tone is normal. No fasciculations in extremities.  Individual muscle group testing (MRC grade out of 5):  Movement     Neck flexion 5    Neck extension 5     Right Left   Shoulder abduction 5 5   Shoulder adduction 5 5   Shoulder ext rotation 5 5   Shoulder int rotation 5 5   Elbow flexion 5 5   Elbow extension 5 5   Wrist extension 5 5   Wrist flexion 5 5   Finger abduction - FDI 5 5   Finger abduction - ADM 5 5   Finger extension 5 5   Finger distal flexion - 2/3 5 5    Finger distal flexion - 4/5 5 5    Thumb flexion - FPL 5 5   Thumb abduction - APB 5 5    Hip flexion 4+ 4+   Hip extension 5 5   Hip adduction 5 5   Hip abduction 5 5   Knee extension 5 5   Knee flexion 5 5   Dorsiflexion 5 5   Plantarflexion 5 5     Reflexes:  Right Left  Bicep 2+ 2+  Tricep 2+ 2+  BrRad 2+ 2+  Knee 2+ 2+  Ankle 0 (?edema related) 0 (?edema related   Sensation: Pinprick intact in all extremities Coordination: Intact finger-to- nose-finger bilaterally.  Gait: Wide-based, unsteady gait.   Lab and Test Review: New results: 05/14/23: Copper wnl VEGF wnl Kappa/lambda light chains: elevated to 1.79 GAD65: elevated to > 250 international units/mL IA-2 ab: elevated to  335.4 international units/mL Insulin abs elevated to 0.8 units/mL  EMG (05/14/23): NCV & EMG Findings: Extensive electrodiagnostic evaluation of the left lower limb with additional nerve conduction studies of the left upper limb and needle examination of the left thoracic paraspinal muscles (T8 and T5 levels) shows: Left sural and superficial peroneal/fibular sensory responses are absent. Left ulnar sensory response shows reduced amplitude  (4 V). Left median and radial sensory responses are within normal limits. Left peroneal/fibular (EDB) motor response shows reduced amplitude (1.31 mV). Left peroneal/fibular (TA), tibial (AH), median (APB), and ulnar (ADM) motor responses are within normal limits. Left H reflex is absent. Chronic motor axon loss changes are seen in the left medial head of gastrocnemius and left  short head of biceps femoris muscles, with active denervation changes also seen in the medial head of gastrocnemius muscle. All muscles were difficult to relax, even with antagonist muscle contraction. Lumbosacral paraspinal muscles were deferred due to prior lumbosacral spine surgery.   Impression: This is an abnormal study. The findings are most consistent with the following: Evidence of an active/ongoing intraspinal canal lesion (ie: motor radiculopathy) at the left S1 root or segment, moderate in degree electrically. Evidence of a length dependent, large fiber sensorimotor neuropathy, axon loss in type, mild to moderate in degree electrically. No electrodiagnostic evidence of demyelinating neuropathy. No electrodiagnostic evidence of a left median mononeuropathy at or distal to the wrist (ie: carpal tunnel syndrome).  Previously reviewed results: 02/20/23: CK 115 CBC significant for MCV of 65.9 (chronic) CMP unremarkable   11/22/22: PSA: 10.42 Lipid panel significant for LDL of 116 TSH 5.02 (wnl) [id number]: 5.9   04/05/22: ANA positive at 1:40 ACE wnl Ionized Ca wnl PTH wnl Vit D: 20.42 CRP < 1.0 ESR wnl (15) CCP negative RF negative B12: 1303 Ferritin 25.7   Imaging: CT lumbar spine and myelogram (11/02/22): FINDINGS: LUMBAR MYELOGRAM FINDINGS:   Unchanged trace retrolisthesis at T12-L1 and L1-L2. No dynamic instability. Small ventral extradural defects from T12-L1 through L4-L5. No spinal canal stenosis or nerve root effacement.   CT LUMBAR MYELOGRAM FINDINGS:   Segmentation: Standard.    Alignment: Unchanged mild levocurvature. Unchanged trace retrolisthesis at T12-L1 and L1-L2.   Vertebrae: No acute fracture or other focal pathologic process. Small L3 bone island.   Conus medullaris and cauda equina: Conus extends to the T12-L1 level. Conus and cauda equina appear normal.   Paraspinal and other soft tissues: Aortoiliac atherosclerotic vascular disease.   Disc levels:   T12-L1: Unchanged small broad-based posterior disc osteophyte complex and mild left facet arthropathy. No stenosis.   L1-L2: Unchanged small shallow right paracentral disc osteophyte complex and mild bilateral facet arthropathy. No stenosis.   L2-L3: Unchanged small broad-based posterior disc osteophyte complex and mild bilateral facet arthropathy. Unchanged mild right neuroforaminal stenosis. No spinal canal or left neuroforaminal stenosis.   L3-L4: Unchanged small broad-based posterior disc osteophyte complex and bilateral facet arthropathy. Unchanged mild bilateral neuroforaminal stenosis. No spinal canal stenosis.   L4-L5: Unchanged small broad-based posterior disc osteophyte complex and mild bilateral facet arthropathy. No stenosis.   L5-S1: Prior left hemilaminectomy. Unchanged tiny broad-based posterior disc protrusion and mild-to-moderate bilateral facet arthropathy. No stenosis.   IMPRESSION: 1. Unchanged mild multilevel lumbar spondylosis as described above. No high-grade stenosis or impingement. 2. Unchanged trace retrolisthesis at T12-L1 and L1-L2. No dynamic instability. 3.  Aortic Atherosclerosis (ICD10-I70.0).   MRI lumbar spine wo contrast (09/30/2010): IMPRESSION:  1. Probable left S1 nerve root encroachment at L5-S1, felt to be  due to a combination of a posterolateral disc protrusion and a  probable synovial cyst extending medially from the left facet  joint.  This seems to be the most likely explanation for the  patient's symptoms.  Correlation with level of  radicular symptoms  is recommended.  2.  Mild disc bulging and facet disease at the additional levels  with mild inferior foraminal narrowing as detailed above.  There is  the potential for extraforaminal nerve root encroachment on the  left at L2-L3, not definite.  No other significant spinal stenosis  is demonstrated.    EMG (04/20/22 by Dr. Wynn Banker):      ASSESSMENT: This is [name], a 67 y.o. male with muscle spasms  in trunk and lower extremities, exaggerated startle, imbalance, and falls. His EMG showed evidence of an active left S1 radiculopathy, but more importantly, agonist/antagonist co-contraction. His GAD65 antibodies are also very elevated. 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 I discussed SPS with patient, including signs and symptoms, the occasional association with malignancy, the progressive nature, and work up and treatment options as below.  Plan: -CT chest/abdomen/pelvis w/wo contrast to evaluate for malignancy -Discussed LP, will defer for now as clinical symptoms and testing is consistent with SPS -Valium 5 mg QID, may have to up titrate, but will start on a low dose -Stop gabapentin as this does not appear to be helping patient -Will consider IVIg or other immune therapy if needed -Referral to physical therapy (patient wants to go to [facility]) -See hematology as planned on 06/10/23  Return to clinic as scheduled on 08/21/23.  Total time spent reviewing records, interview, history/exam, documentation, and coordination of care on day of encounter:  60 min  Jacquelyne Balint, MD

## 2023-05-29 ENCOUNTER — Encounter: Payer: Self-pay | Admitting: Neurology

## 2023-05-29 ENCOUNTER — Ambulatory Visit (INDEPENDENT_AMBULATORY_CARE_PROVIDER_SITE_OTHER): Payer: Medicare Other | Admitting: Neurology

## 2023-05-29 VITALS — BP 132/51 | HR 53 | Ht 75.0 in | Wt 241.4 lb

## 2023-05-29 DIAGNOSIS — R760 Raised antibody titer: Secondary | ICD-10-CM

## 2023-05-29 DIAGNOSIS — R29898 Other symptoms and signs involving the musculoskeletal system: Secondary | ICD-10-CM | POA: Diagnosis not present

## 2023-05-29 DIAGNOSIS — M5417 Radiculopathy, lumbosacral region: Secondary | ICD-10-CM

## 2023-05-29 DIAGNOSIS — M62838 Other muscle spasm: Secondary | ICD-10-CM

## 2023-05-29 DIAGNOSIS — G2582 Stiff-man syndrome: Secondary | ICD-10-CM

## 2023-05-29 DIAGNOSIS — R198 Other specified symptoms and signs involving the digestive system and abdomen: Secondary | ICD-10-CM | POA: Diagnosis not present

## 2023-05-29 DIAGNOSIS — R2689 Other abnormalities of gait and mobility: Secondary | ICD-10-CM

## 2023-05-29 DIAGNOSIS — W19XXXS Unspecified fall, sequela: Secondary | ICD-10-CM

## 2023-05-29 DIAGNOSIS — D472 Monoclonal gammopathy: Secondary | ICD-10-CM

## 2023-05-29 DIAGNOSIS — R269 Unspecified abnormalities of gait and mobility: Secondary | ICD-10-CM

## 2023-05-29 MED ORDER — DIAZEPAM 5 MG PO TABS: 5.0000 mg | ORAL_TABLET | Freq: Four times a day (QID) | ORAL | 2 refills | Status: DC

## 2023-05-29 NOTE — Patient Instructions (Addendum)
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  More information on Stiff Person Syndrome:  [email or web address]   We need to get a CT chest, abdomen, and pelvis because sometimes stiff person syndrome is associated with cancer. We need to look and make sure.  I also want you to see hematology as planned on 06/10/23.  I will start treatment with Valium 5 mg. You will eventually take it 4 times per day, but we will start slow. You will start taking 5 mg at bedtime. Do this for 3 days then increase to 5 mg twice daily. Do this for 3 days then increase to 5 mg three times daily. Do this for 3 days then increase to 4 times per day.  Call me if you have any problems with the medication or are not having symptom relief.  I will see you in clinic as scheduled on 08/21/23.  Please let me know if you have any questions or concerns in the meantime.  The physicians and staff at [facility] are committed to providing excellent care. You may receive a survey requesting feedback about your experience at our office. We strive to receive "very good" responses to the survey questions. If you feel that your experience would prevent you from giving the office a "very good " response, please contact our office to try to remedy the situation. We may be reached at [phone]. Thank you for taking the time out of your busy day to complete the survey.  Jacquelyne Balint, MD [facility]  Preventing Falls at [facility] are common, often dreaded events in the lives of older people. Aside from the obvious injuries and even death that may result, fall can cause wide-ranging consequences including loss of independence, mental decline, decreased activity and mobility. Younger people are also at risk of falling, especially those with chronic  illnesses and fatigue.  Ways to reduce risk for falling Examine diet and medications. Warm foods and alcohol dilate blood vessels, which can lead to dizziness when standing. Sleep aids, antidepressants and pain medications can also increase the likelihood of a fall.  Get a vision exam. Poor vision, cataracts and glaucoma increase the chances of falling.  Check foot gear. Shoes should fit snugly and have a sturdy, nonskid sole and a broad, low heel  Participate in a physician-approved exercise program to build and maintain muscle strength and improve balance and coordination. Programs that use ankle weights or stretch bands are excellent for muscle-strengthening. Water aerobics programs and low-impact Tai Chi programs have also been shown to improve balance and coordination.  Increase vitamin D intake. Vitamin D improves muscle strength and increases the amount of calcium the body is able to absorb and deposit in bones.  How to prevent falls from common hazards Floors - Remove all loose wires, cords, and throw rugs. Minimize clutter. Make sure rugs are anchored and smooth. Keep furniture in its usual place.  Chairs -- Use chairs with straight backs, armrests and firm seats. Add firm cushions to existing pieces to add height.  Bathroom - Install grab bars and non-skid tape in the tub or shower. Use a bathtub transfer bench or a shower chair with a back support Use an elevated toilet seat and/or safety rails to assist standing from a low surface. Do not use towel racks or bathroom tissue holders to help you stand.  Lighting - Make sure halls, stairways,  and entrances are well-lit. Install a night light in your bathroom or hallway. Make sure there is a light switch at the top and bottom of the staircase. Turn lights on if you get up in the middle of the night. Make sure lamps or light switches are within reach of the bed if you have to get up during the night.  Kitchen - Install non-skid rubber  mats near the sink and stove. Clean spills immediately. Store frequently used utensils, pots, pans between waist and eye level. This helps prevent reaching and bending. Sit when getting things out of lower cupboards.  Living room/ Bedrooms - Place furniture with wide spaces in between, giving enough room to move around. Establish a route through the living room that gives you something to hold onto as you walk.  Stairs - Make sure treads, rails, and rugs are secure. Install a rail on both sides of the stairs. If stairs are a threat, it might be helpful to arrange most of your activities on the lower level to reduce the number of times you must climb the stairs.  Entrances and doorways - Install metal handles on the walls adjacent to the doorknobs of all doors to make it more secure as you travel through the doorway.  Tips for maintaining balance Keep at least one hand free at all times. Try using a backpack or fanny pack to hold things rather than carrying them in your hands. Never carry objects in both hands when walking as this interferes with keeping your balance.  Attempt to swing both arms from front to back while walking. This might require a conscious effort if Parkinson's disease has diminished your movement. It will, however, help you to maintain balance and posture, and reduce fatigue.  Consciously lift your feet off of the ground when walking. Shuffling and dragging of the feet is a common culprit in losing your balance.  When trying to navigate turns, use a "U" technique of facing forward and making a wide turn, rather than pivoting sharply.  Try to stand with your feet shoulder-length apart. When your feet are close together for any length of time, you increase your risk of losing your balance and falling.  Do one thing at a time. Don't try to walk and accomplish another task, such as reading or looking around. The decrease in your automatic reflexes complicates motor function, so the  less distraction, the better.  Do not wear rubber or gripping soled shoes, they might "catch" on the floor and cause tripping.  Move slowly when changing positions. Use deliberate, concentrated movements and, if needed, use a grab bar or walking aid. Count 15 seconds between each movement. For example, when rising from a seated position, wait 15 seconds after standing to begin walking.  If balance is a continuous problem, you might want to consider a walking aid such as a cane, walking stick, or walker. Once you've mastered walking with help, you might be ready to try it on your own again.

## 2023-06-10 ENCOUNTER — Inpatient Hospital Stay: Payer: Medicare Other

## 2023-06-10 ENCOUNTER — Encounter: Payer: Self-pay | Admitting: Hematology and Oncology

## 2023-06-10 ENCOUNTER — Other Ambulatory Visit: Payer: Self-pay

## 2023-06-10 ENCOUNTER — Inpatient Hospital Stay: Payer: Medicare Other | Attending: Hematology and Oncology | Admitting: Hematology and Oncology

## 2023-06-10 VITALS — BP 159/86 | HR 55 | Temp 99.0°F | Resp 18 | Ht 75.0 in | Wt 245.8 lb

## 2023-06-10 DIAGNOSIS — R718 Other abnormality of red blood cells: Secondary | ICD-10-CM | POA: Insufficient documentation

## 2023-06-10 DIAGNOSIS — C61 Malignant neoplasm of prostate: Secondary | ICD-10-CM

## 2023-06-10 DIAGNOSIS — D472 Monoclonal gammopathy: Secondary | ICD-10-CM | POA: Diagnosis not present

## 2023-06-10 DIAGNOSIS — D7589 Other specified diseases of blood and blood-forming organs: Secondary | ICD-10-CM | POA: Diagnosis not present

## 2023-06-10 NOTE — Progress Notes (Signed)
[facility] CONSULT NOTE  Patient Care Team: [name], MD as PCP - General (Internal Medicine) Antony Madura, MD as Consulting Physician (Neurology)  ASSESSMENT & PLAN:  MGUS (monoclonal gammopathy of unknown significance) The patient has no evidence of organ damage I recommend repeat labs in October That we gave me 2 data points in comparison with his baseline lab from July We discussed natural history of MGUS  Prostate cancer (HCC); T1c; GG 1; initial diagnosis 2011 I briefly touched base with the patient regarding the importance of urology follow-up  Macrocytosis without anemia I suspect he might have thalassemia Observe only  Orders Placed This Encounter  Procedures   CBC with Differential (Cancer Center Only)    Standing Status:   Future    Standing Expiration Date:   06/09/2024   CMP (Cancer Center only)    Standing Status:   Future    Standing Expiration Date:   06/09/2024   Lactate dehydrogenase    Standing Status:   Future    Standing Expiration Date:   06/09/2024   Kappa/lambda light chains    Standing Status:   Future    Standing Expiration Date:   06/09/2024   Multiple Myeloma Panel (SPEP&IFE w/QIG)    Standing Status:   Future    Standing Expiration Date:   06/09/2024   Beta 2 microglobulin, serum    Standing Status:   Future    Standing Expiration Date:   06/09/2024    All questions were answered. The patient knows to call the clinic with any problems, questions or concerns. I spent 55 minutes counseling the patient face to face and on counseling.     Artis Delay, MD 06/10/23 12:36 PM  CHIEF COMPLAINTS/PURPOSE OF CONSULTATION:  MGUS  HISTORY OF PRESENTING ILLNESS:  Michael [name] 67 y.o. male is here because of recent finding of MGUS The patient has neurological disorder and his neurologist order workup for this.  Immunofixation revealed IgG kappa MGUS Based on his blood work done at baseline, he does not have evidence of  hypercalcemia, anemia or renal failure.  He is noted to have microcytosis with elevated RBC count He denies history of abnormal bone pain or bone fracture. Patient denies history of recurrent infection or atypical infections such as shingles of meningitis. Denies chills, night sweats, anorexia or abnormal weight loss. Patient have diagnosis of prostate cancer based on biopsy from 2011.  The patient shared with me his unpleasant experience from previous visit with his prior urologist He was adamant he was misdiagnosed even though pathology report from 2011 confirmed the diagnosis of prostate cancer He is relatively asymptomatic I have reviewed his records extensively  MEDICAL HISTORY:  Past Medical History:  Diagnosis Date   Hyperlipidemia    Hypertension    Lumbar disc disease    Prostate cancer (HCC)     SURGICAL HISTORY: Past Surgical History:  Procedure Laterality Date   LUMBAR DISC SURGERY  2014   LUMBAR DISC SURGERY     right ankle fracture  1978   external pinning    SOCIAL HISTORY: Social History   Socioeconomic History   Marital status: Single    Spouse name: Not on file   Number of children: Not on file   Years of education: Not on file   Highest education level: Some college, no degree  Occupational History   Not on file  Tobacco Use   Smoking status: Never    Passive exposure: Never  Smokeless tobacco: Never  Vaping Use   Vaping status: Never Used  Substance and Sexual Activity   Alcohol use: Not Currently    Comment: rare   Drug use: No   Sexual activity: Yes  Other Topics Concern   Not on file  Social History Narrative   Right Handed    Lives in a two story home.   Are you right handed or left handed? Right    Are you currently employed ?    What is your current occupation? retired   Do you live at home alone? no   Who lives with you? Mother   What type of home do you live in: 1 story or 2 story? two       Social Determinants of Health    Financial Resource Strain: Medium Risk (12/04/2021)   Overall Financial Resource Strain (CARDIA)    Difficulty of Paying Living Expenses: Somewhat hard  Food Insecurity: Food Insecurity Present (12/04/2021)   Hunger Vital Sign    Worried About Running Out of Food in the Last Year: Sometimes true    Ran Out of Food in the Last Year: Sometimes true  Transportation Needs: No Transportation Needs (12/04/2021)   PRAPARE - Administrator, Civil Service (Medical): No    Lack of Transportation (Non-Medical): No  Physical Activity: Insufficiently Active (12/04/2021)   Exercise Vital Sign    Days of Exercise per Week: 2 days    Minutes of Exercise per Session: 30 min  Stress: Stress Concern Present (12/04/2021)   [employer] of Occupational Health - Occupational Stress Questionnaire    Feeling of Stress : To some extent  Social Connections: Moderately Isolated (12/04/2021)   Social Connection and Isolation Panel [NHANES]    Frequency of Communication with Friends and Family: Twice a week    Frequency of Social Gatherings with Friends and Family: Once a week    Attends Religious Services: 1 to 4 times per year    Active Member of [employer] or Organizations: No    Attends Engineer, structural: Not on file    Marital Status: Divorced  Catering manager Violence: Not on file    FAMILY HISTORY: Family History  Problem Relation Age of Onset   High blood pressure Mother     ALLERGIES:  has No Known Allergies.  MEDICATIONS:  Current Outpatient Medications  Medication Sig Dispense Refill   diazepam (VALIUM) 5 MG tablet Take 1 tablet (5 mg total) by mouth in the morning, at noon, in the evening, and at bedtime. 120 tablet 2   GARLIC PO Take by mouth daily.     hydrALAZINE (APRESOLINE) 25 MG tablet Take 1 tablet (25 mg total) by mouth 3 (three) times daily. 270 tablet 0   triamterene-hydrochlorothiazide (DYAZIDE) 37.5-25 MG capsule Take 1 each (1 capsule total) by mouth daily. 90  capsule 0   No current facility-administered medications for this visit.    REVIEW OF SYSTEMS:   Eyes: Denies blurriness of vision, double vision or watery eyes Ears, nose, mouth, throat, and face: Denies mucositis or sore throat Respiratory: Denies cough, dyspnea or wheezes Cardiovascular: Denies palpitation, chest discomfort or lower extremity swelling Gastrointestinal:  Denies nausea, heartburn or change in bowel habits Skin: Denies abnormal skin rashes Lymphatics: Denies new lymphadenopathy or easy bruising Behavioral/Psych: Mood is stable, no new changes  All other systems were reviewed with the patient and are negative.  PHYSICAL EXAMINATION: ECOG PERFORMANCE STATUS: 1 - Symptomatic but completely ambulatory  Vitals:   06/10/23 1024  BP: (!) 159/86  Pulse: (!) 55  Resp: 18  Temp: 99 F (37.2 C)  SpO2: 100%   Filed Weights   06/10/23 1024  Weight: 245 lb 12.8 oz (111.5 kg)    GENERAL:alert, no distress and comfortable SKIN: skin color, texture, turgor are normal, no rashes or significant lesions EYES: normal, conjunctiva are pink and non-injected, sclera clear Musculoskeletal:no cyanosis of digits and no clubbing  PSYCH: alert & oriented x 3 with fluent speech NEURO: The patient has minor balance difficulties and walks with the cane  LABORATORY DATA:  I have reviewed the data as listed Lab Results  Component Value Date   WBC 3.5 (L) 02/20/2023   HGB 14.1 02/20/2023   HCT 41.2 02/20/2023   MCV 65.9 (L) 02/20/2023   PLT 162 02/20/2023

## 2023-06-10 NOTE — Assessment & Plan Note (Signed)
I briefly touched base with the patient regarding the importance of urology follow-up

## 2023-06-10 NOTE — Assessment & Plan Note (Signed)
I suspect he might have thalassemia Observe only

## 2023-06-10 NOTE — Assessment & Plan Note (Signed)
The patient has no evidence of organ damage I recommend repeat labs in October That we gave me 2 data points in comparison with his baseline lab from July We discussed natural history of MGUS

## 2023-06-12 ENCOUNTER — Encounter: Payer: Self-pay | Admitting: Neurology

## 2023-06-12 ENCOUNTER — Telehealth: Payer: Self-pay

## 2023-06-12 ENCOUNTER — Other Ambulatory Visit: Payer: Self-pay

## 2023-06-12 NOTE — Telephone Encounter (Signed)
[employer] Authorization for CT of chest [id number] good from 06/12/23-07/11/23

## 2023-06-17 ENCOUNTER — Ambulatory Visit
Admission: RE | Admit: 2023-06-17 | Discharge: 2023-06-17 | Disposition: A | Discharge status: Home / Self Care | Payer: Medicare Other | Source: Ambulatory Visit | Attending: Neurology | Admitting: Neurology

## 2023-06-17 ENCOUNTER — Telehealth: Payer: Self-pay | Admitting: Neurology

## 2023-06-17 DIAGNOSIS — G2582 Stiff-man syndrome: Secondary | ICD-10-CM | POA: Diagnosis not present

## 2023-06-17 DIAGNOSIS — R2689 Other abnormalities of gait and mobility: Secondary | ICD-10-CM

## 2023-06-17 DIAGNOSIS — N400 Enlarged prostate without lower urinary tract symptoms: Secondary | ICD-10-CM | POA: Diagnosis not present

## 2023-06-17 DIAGNOSIS — M5417 Radiculopathy, lumbosacral region: Secondary | ICD-10-CM

## 2023-06-17 DIAGNOSIS — D472 Monoclonal gammopathy: Secondary | ICD-10-CM

## 2023-06-17 DIAGNOSIS — I700 Atherosclerosis of aorta: Secondary | ICD-10-CM | POA: Diagnosis not present

## 2023-06-17 DIAGNOSIS — R29898 Other symptoms and signs involving the musculoskeletal system: Secondary | ICD-10-CM

## 2023-06-17 DIAGNOSIS — K5730 Diverticulosis of large intestine without perforation or abscess without bleeding: Secondary | ICD-10-CM | POA: Diagnosis not present

## 2023-06-17 DIAGNOSIS — R198 Other specified symptoms and signs involving the digestive system and abdomen: Secondary | ICD-10-CM

## 2023-06-17 DIAGNOSIS — M62838 Other muscle spasm: Secondary | ICD-10-CM

## 2023-06-17 DIAGNOSIS — W19XXXS Unspecified fall, sequela: Secondary | ICD-10-CM

## 2023-06-17 DIAGNOSIS — R269 Unspecified abnormalities of gait and mobility: Secondary | ICD-10-CM

## 2023-06-17 NOTE — Telephone Encounter (Signed)
Called patient to discuss the results of his CT chest, abdomen, pelvis. It showed no evidence of a mass or other abnormalities that would suggest malignancy. Patient states that since starting Valium, he feels his symptoms are improving. He has not fallen. He feels he may be getting stronger as well.  Patient will continue valium and monitor for sustained response. He will follow up with me as planned in 07/2023 and will call with new or worsening symptoms.  All questions were answered.  Jacquelyne Balint, MD [facility]

## 2023-06-24 ENCOUNTER — Telehealth: Payer: Self-pay | Admitting: Neurology

## 2023-06-24 NOTE — Telephone Encounter (Signed)
Pt came by and dropped off a Handicap Placard to be completed by the provider.  Pt would like to have a call once it has been completed by the provider.   Placed in the providers box for completion.

## 2023-06-25 ENCOUNTER — Ambulatory Visit: Payer: Medicare Other | Admitting: Physical Therapy

## 2023-06-25 ENCOUNTER — Other Ambulatory Visit: Payer: Self-pay

## 2023-06-25 ENCOUNTER — Encounter: Payer: Self-pay | Admitting: Physical Therapy

## 2023-06-25 DIAGNOSIS — R600 Localized edema: Secondary | ICD-10-CM | POA: Diagnosis not present

## 2023-06-25 DIAGNOSIS — Z9181 History of falling: Secondary | ICD-10-CM

## 2023-06-25 DIAGNOSIS — M25662 Stiffness of left knee, not elsewhere classified: Secondary | ICD-10-CM

## 2023-06-25 DIAGNOSIS — M5417 Radiculopathy, lumbosacral region: Secondary | ICD-10-CM | POA: Diagnosis not present

## 2023-06-25 DIAGNOSIS — G2582 Stiff-man syndrome: Secondary | ICD-10-CM | POA: Insufficient documentation

## 2023-06-25 DIAGNOSIS — R29898 Other symptoms and signs involving the musculoskeletal system: Secondary | ICD-10-CM | POA: Insufficient documentation

## 2023-06-25 DIAGNOSIS — W19XXXS Unspecified fall, sequela: Secondary | ICD-10-CM | POA: Insufficient documentation

## 2023-06-25 DIAGNOSIS — R2689 Other abnormalities of gait and mobility: Secondary | ICD-10-CM | POA: Diagnosis not present

## 2023-06-25 DIAGNOSIS — D472 Monoclonal gammopathy: Secondary | ICD-10-CM | POA: Diagnosis not present

## 2023-06-25 DIAGNOSIS — M25672 Stiffness of left ankle, not elsewhere classified: Secondary | ICD-10-CM

## 2023-06-25 DIAGNOSIS — M6281 Muscle weakness (generalized): Secondary | ICD-10-CM

## 2023-06-25 DIAGNOSIS — R760 Raised antibody titer: Secondary | ICD-10-CM | POA: Insufficient documentation

## 2023-06-25 DIAGNOSIS — M25652 Stiffness of left hip, not elsewhere classified: Secondary | ICD-10-CM

## 2023-06-25 DIAGNOSIS — R198 Other specified symptoms and signs involving the digestive system and abdomen: Secondary | ICD-10-CM | POA: Insufficient documentation

## 2023-06-25 DIAGNOSIS — M25675 Stiffness of left foot, not elsewhere classified: Secondary | ICD-10-CM | POA: Diagnosis not present

## 2023-06-25 DIAGNOSIS — R2681 Unsteadiness on feet: Secondary | ICD-10-CM

## 2023-06-25 DIAGNOSIS — R252 Cramp and spasm: Secondary | ICD-10-CM

## 2023-06-25 NOTE — Therapy (Signed)
OUTPATIENT PHYSICAL THERAPY NEURO EVALUATION   Patient Name: Michael [name] MRN: [id number] DOB:01/28/1956, 67 y.o., male Today's Date: 06/25/2023   PCP: [name], MD REFERRING PROVIDER: Antony Madura, MD  END OF SESSION:  PT End of Session - 06/25/23 1458     Visit Number 1    Number of Visits 9   8 + eval   Date for PT Re-Evaluation 08/30/23   pushed out due to scheduling delay   Authorization Type [employer] MEDICARE    Progress Note Due on Visit 10    PT Start Time 1450    PT Stop Time 1532    PT Time Calculation (min) 42 min    Equipment Utilized During Treatment Gait belt    Behavior During Therapy WFL for tasks assessed/performed             Past Medical History:  Diagnosis Date   Hyperlipidemia    Hypertension    Lumbar disc disease    Prostate cancer (HCC)    Past Surgical History:  Procedure Laterality Date   LUMBAR DISC SURGERY  2014   LUMBAR DISC SURGERY     right ankle fracture  1978   external pinning   Patient Active Problem List   Diagnosis Date Noted   MGUS (monoclonal gammopathy of unknown significance) 06/10/2023   Macrocytosis without anemia 06/10/2023   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   DDD (degenerative disc disease), lumbar 12/21/2022   Prostate cancer (HCC); T1c; GG 1; initial diagnosis 2011 12/05/2022   Chronic hyperglycemia 11/16/2022   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   Contusion of ulnar nerve, initial encounter 11/09/2022   Dyslipidemia, goal LDL below 70 09/23/2022   Elevated PSA, between 10 and less than 20 ng/ml 09/17/2022   Accelerated hypertension 09/17/2022   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   Vitamin D deficiency 09/07/2021    ONSET DATE: symptoms began in 2020  REFERRING DIAG: R29.898 (ICD-10-CM) - Weakness of both lower extremities R19.8 (ICD-10-CM) - Abdominal weakness M62.838 (ICD-10-CM) - Muscle spasm R26.9 (ICD-10-CM) - Gait abnormality W19.XXXS (ICD-10-CM) - Fall, sequela M54.17 (ICD-10-CM) -  Lumbosacral radiculopathy at S1 R26.89 (ICD-10-CM) - Imbalance D47.2 (ICD-10-CM) - Monoclonal gammopathy 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,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  He has had a progression of weakness in his core and BLE that started on the right side but is moreso affecting his left side to date.  He has cramping, instability when walking and standing, and difficulty standing to put his pants on.  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 Pt accompanied by: self (he continues to drive himself)  PERTINENT HISTORY: degenerative disc disease (followed by [facility]) s/p L5-S1 surgery, HLD, HTN, prostate cancer  Patient's symptoms started in 2020 - right flank pain (reported initially as abdominal pain, but pt clarifies this is not a deep cramp/stomach pain, but a muscular soreness especially with movement), core weakness (not able to sit up well), sneezing and coughing was painful as well.  A year later he noted stiffness in his legs after sitting where he has to walk up his legs with his hands.  Now, he reports numbness and soreness in left hamstring area as well as swelling and muscle spasms (left worse).  In order to walk he  has to weight shift and bring feet into narrowed BOS then start with small steps.  He loosens up with movement. He reports freezing when startled. Ambulates w/ SPC.  Upper body remains WNL.  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  PAIN:  Are you having pain? No  PRECAUTIONS: Fall  RED FLAGS: None   WEIGHT BEARING RESTRICTIONS: No  FALLS: Has patient fallen in last 6 months? Yes. Number of falls 3 times since the beginning of 2024. He last fell about 1 week ago while trying to do lawn work. He was holding on the the mower for stability. He saw an insect that scared him, his left leg locked, then he fell.  LIVING ENVIRONMENT: Lives with: lives alone Lives in: House/apartment Stairs: No Has following equipment at home: Single point cane  PLOF: Independent with household mobility with device, Independent with community mobility with device, Requires assistive device for independence, Needs assistance with ADLs, and Needs assistance with homemaking  PATIENT GOALS: To start strength training.  OBJECTIVE:   DIAGNOSTIC FINDINGS:  EMG on 05/14/23 showed an active left S1 radiculopathy (similar to prior EMG)  COGNITION: Overall cognitive status: Within functional limits for tasks assessed and pt hyperverbal   SENSATION: Light touch: WFL  COORDINATION: LE RAMS:  slow and deliberate Bilateral Heel-to-shin:  WNL  EDEMA:  Notable left medial thigh swelling and enlarged veins, bilateral ankle edema (baseline per pt)  MUSCLE TONE: Difficult to assess due to pt guarding/stiffness - possible R clonus  POSTURE: No Significant postural limitations  LOWER EXTREMITY ROM:     Active  Right Eval Left Eval  Hip flexion Grossly WFL  Hip extension   Hip abduction   Hip adduction   Hip internal rotation   Hip external rotation   Knee flexion   Knee extension   Ankle dorsiflexion    Ankle plantarflexion    Ankle inversion    Ankle eversion     (Blank rows = not tested)  LOWER EXTREMITY MMT:    MMT Right Eval Left Eval  Hip flexion 4-/5 3+/5  Hip extension    Hip abduction    Hip adduction    Hip internal rotation    Hip external rotation    Knee flexion    Knee extension 4/5 4/5  Ankle dorsiflexion 3/5 3/5  Ankle plantarflexion    Ankle inversion    Ankle eversion    (Blank rows = not tested)  BED MOBILITY:  Pt needs increased time to get into standard bed and states his is too soft.  He sometimes sleeps on the couch because it is easier to move on a firmer surface.  TRANSFERS: Assistive device utilized: Single point cane  Sit to stand: Modified independence Stand to sit: Modified independence Chair to chair: SBA Floor:  pt states he can get himself up with something to push on  GAIT: Gait pattern: step through pattern, decreased step length- Right, decreased step length- Left, decreased stride length, decreased hip/knee flexion- Right, decreased hip/knee flexion- Left, and narrow BOS Distance walked: various clinic distances Assistive device utilized: Single point cane Level of assistance: SBA Comments: No notable ataxia or involuntary movements during ambulation into/out of clinic.  FUNCTIONAL TESTS:  5 times sit to stand: 26.72 sec w/ BUE support Timed up and go (TUG): To be assessed. 10 meter walk test: To be assessed.  PATIENT SURVEYS:  None appropriate for patient presentation.  TODAY'S TREATMENT:                                                                                                                              DATE: N/A - eval only.   PATIENT EDUCATION: Education details: PT POC, assessments used and to be used, and goals to be set. Person educated: Patient Education method: Explanation Education comprehension: verbalized understanding and needs further education  HOME EXERCISE PROGRAM: To be  established.  GOALS: Goals reviewed with patient? Yes  SHORT TERM GOALS: Target date: 07/26/2023  Pt will decrease 5xSTS to </=19.72 seconds in order to demonstrate decreased risk for falls and improved functional bilateral LE strength and power. Baseline:  26.72 w/ BUE support Goal status: INITIAL  2.  TUG to be assessed w/ STG/LTG set as appropriate. Baseline: To be assessed. Goal status: INITIAL  3.  to be assessed w/ STG/LTG set as appropriate. Baseline: To be assessed. Goal status: INITIAL  LONG TERM GOALS: Target date: 08/23/2023  Pt will be independent and compliant with strength, stretching, and balance focused HEP in order to maintain functional progress and improve mobility. Baseline:  To be established. Goal status: INITIAL  2.  Pt will decrease 5xSTS to </=12.72 seconds in order to demonstrate decreased risk for falls and improved functional bilateral LE strength and power. Baseline: 26.72 w/ BUE support Goal status: INITIAL  3.  TUG to be assessed w/ STG/LTG set as appropriate. Baseline: To be assessed. Goal status: INITIAL  4.  to be assessed w/ STG/LTG set as appropriate. Baseline: To be assessed. Goal status: INITIAL  5.  Pt will ambulate >/=800 feet with LRAD at modI level over unlevel outdoor surfaces and grass to promote household and community access. Baseline: To be assessed. Goal status: INITIAL  ASSESSMENT:  CLINICAL IMPRESSION: Patient is a 67 y.o. male who  was seen today for physical therapy evaluation and treatment for generalized weakness, stiffness, and gait instability.  Pt has a significant PMH of degenerative disc disease (followed by [facility]) s/p L5-S1 surgery, HLD, HTN, and prostate cancer.  His EMG indicates ongoing S1 radiculopathy confounding current symptoms.  Identified impairments include cramping, repeated falls, impaired coordination, left thigh edema, generalized weakness, difficulty with mobility on soft  surfaces, decreased stride length, decreased hip and knee flexion on the left, and narrowed BOS during standing and ambulation.  Evaluation via the following assessment tools: 5xSTS indicate fall risk.  Will assess TUG and at next session in order to further assess deficits and fall risk.  He would benefit from skilled PT to address impairments as noted and progress towards long term goals.  OBJECTIVE IMPAIRMENTS: decreased activity tolerance, decreased balance, decreased coordination, decreased endurance, decreased knowledge of condition, decreased mobility, difficulty walking, decreased strength, increased edema, and increased muscle spasms.   ACTIVITY LIMITATIONS: carrying, lifting, bending, sitting, standing, squatting, stairs, transfers, bed mobility, and locomotion level  PARTICIPATION LIMITATIONS: shopping, community activity, and yard work  PERSONAL FACTORS: Age, Fitness, and 1-2 comorbidities: HTN, ongoing S1 radiculopathy  are also affecting patient's functional outcome.   REHAB POTENTIAL: Good  CLINICAL DECISION MAKING: Evolving/moderate complexity  EVALUATION COMPLEXITY: Moderate  PLAN:  PT FREQUENCY: 1x/week  PT DURATION: 8 weeks  PLANNED INTERVENTIONS: Therapeutic exercises, Therapeutic activity, Neuromuscular re-education, Balance training, Gait training, Patient/Family education, Self Care, Stair training, Vestibular training, Orthotic/Fit training, DME instructions, Manual therapy, and Re-evaluation  PLAN FOR NEXT SESSION: ASSESS TUG and - set STG/LTGs.  Initiate HEP for core and LE strength, stretching, and balance.  SciFit for reciprocal mobility?  Walking program.   Sadie Haber, PT, DPT 06/25/2023, 5:05 PM

## 2023-06-25 NOTE — Telephone Encounter (Signed)
Called and left a message that paper were ready and at front desk to be picked up.

## 2023-07-02 ENCOUNTER — Encounter: Payer: Self-pay | Admitting: Physical Therapy

## 2023-07-02 ENCOUNTER — Ambulatory Visit: Payer: Medicare Other | Attending: Neurology | Admitting: Physical Therapy

## 2023-07-02 DIAGNOSIS — R252 Cramp and spasm: Secondary | ICD-10-CM | POA: Diagnosis not present

## 2023-07-02 DIAGNOSIS — R600 Localized edema: Secondary | ICD-10-CM | POA: Diagnosis not present

## 2023-07-02 DIAGNOSIS — M25672 Stiffness of left ankle, not elsewhere classified: Secondary | ICD-10-CM | POA: Insufficient documentation

## 2023-07-02 DIAGNOSIS — R2689 Other abnormalities of gait and mobility: Secondary | ICD-10-CM | POA: Insufficient documentation

## 2023-07-02 DIAGNOSIS — R2681 Unsteadiness on feet: Secondary | ICD-10-CM | POA: Diagnosis not present

## 2023-07-02 DIAGNOSIS — Z9181 History of falling: Secondary | ICD-10-CM | POA: Diagnosis not present

## 2023-07-02 DIAGNOSIS — M25675 Stiffness of left foot, not elsewhere classified: Secondary | ICD-10-CM | POA: Insufficient documentation

## 2023-07-02 DIAGNOSIS — M25652 Stiffness of left hip, not elsewhere classified: Secondary | ICD-10-CM | POA: Insufficient documentation

## 2023-07-02 DIAGNOSIS — M25662 Stiffness of left knee, not elsewhere classified: Secondary | ICD-10-CM | POA: Diagnosis not present

## 2023-07-02 DIAGNOSIS — M6281 Muscle weakness (generalized): Secondary | ICD-10-CM | POA: Insufficient documentation

## 2023-07-02 NOTE — Therapy (Signed)
OUTPATIENT PHYSICAL THERAPY NEURO TREATMENT   Patient Name: Michael [name] MRN: [id number] DOB:03-Sep-1956, 67 y.o., male Today's Date: 07/02/2023   PCP: [name], MD REFERRING PROVIDER: Antony Madura, MD  END OF SESSION:  PT End of Session - 07/02/23 1109     Visit Number 2    Number of Visits 9   8 + eval   Date for PT Re-Evaluation 08/30/23   pushed out due to scheduling delay   Authorization Type [employer] MEDICARE    Progress Note Due on Visit 10    PT Start Time 1105   pt arrived late   PT Stop Time 1150    PT Time Calculation (min) 45 min    Equipment Utilized During Treatment Gait belt    Activity Tolerance Patient tolerated treatment well    Behavior During Therapy WFL for tasks assessed/performed             Past Medical History:  Diagnosis Date   Hyperlipidemia    Hypertension    Lumbar disc disease    Prostate cancer (HCC)    Past Surgical History:  Procedure Laterality Date   LUMBAR DISC SURGERY  2014   LUMBAR DISC SURGERY     right ankle fracture  1978   external pinning   Patient Active Problem List   Diagnosis Date Noted   MGUS (monoclonal gammopathy of unknown significance) 06/10/2023   Macrocytosis without anemia 06/10/2023   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   DDD (degenerative disc disease), lumbar 12/21/2022   Prostate cancer (HCC); T1c; GG 1; initial diagnosis 2011 12/05/2022   Chronic hyperglycemia 11/16/2022   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   Contusion of ulnar nerve, initial encounter 11/09/2022   Dyslipidemia, goal LDL below 70 09/23/2022   Elevated PSA, between 10 and less than 20 ng/ml 09/17/2022   Accelerated hypertension 09/17/2022   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   Vitamin D deficiency 09/07/2021    ONSET DATE: symptoms began in 2020  REFERRING DIAG: R29.898 (ICD-10-CM) - Weakness of both lower extremities R19.8 (ICD-10-CM) - Abdominal weakness M62.838 (ICD-10-CM) - Muscle spasm R26.9 (ICD-10-CM) - Gait  abnormality W19.XXXS (ICD-10-CM) - Fall, sequela M54.17 (ICD-10-CM) - Lumbosacral radiculopathy at S1 R26.89 (ICD-10-CM) - Imbalance D47.2 (ICD-10-CM) - Monoclonal gammopathy 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,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  He presents ambulatory w/ SPC. Pt accompanied by: self (he continues to drive himself)  PERTINENT HISTORY: degenerative disc disease (followed by [facility]) s/p L5-S1 surgery, HLD, HTN, prostate cancer  Patient's symptoms started in 2020 - right flank pain (reported initially as abdominal pain, but pt clarifies this is not a deep cramp/stomach pain, but a muscular soreness especially with movement), core weakness (not able to sit up well), sneezing and coughing was painful as well.  A year later he noted stiffness in his legs after sitting where he has to walk up his legs with his hands.  Now, he reports numbness and soreness in left hamstring area as well as swelling and muscle spasms (left worse).  In order to walk he has to weight shift and bring feet into narrowed BOS then start with small steps.  He loosens up with movement. He reports freezing when startled. Ambulates w/ SPC.  Upper body remains  WNL.  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  PAIN:  Are you having pain? No  PRECAUTIONS: Fall  RED FLAGS: None   WEIGHT BEARING RESTRICTIONS: No  FALLS: Has patient fallen in last 6 months? Yes. Number of falls 3 times since the beginning of 2024. He last fell about 1 week ago while trying to do lawn work. He was holding on the the mower for stability. He saw an insect that scared him, his left leg locked, then he fell.  LIVING ENVIRONMENT: Lives with: lives alone Lives in: House/apartment Stairs: No Has following equipment at home: Single point cane  PLOF: Independent with household mobility with device, Independent with community mobility with device, Requires assistive device for independence, Needs assistance with ADLs, and Needs assistance with homemaking  PATIENT GOALS: To start strength training.  OBJECTIVE:   DIAGNOSTIC FINDINGS:  EMG on 05/14/23 showed an active left S1 radiculopathy (similar to prior EMG)  COGNITION: Overall cognitive status: Within functional limits for tasks assessed and pt hyperverbal   SENSATION: Light touch: WFL  COORDINATION: LE RAMS:  slow and deliberate Bilateral Heel-to-shin:  WNL  EDEMA:  Notable left medial thigh swelling and enlarged veins, bilateral ankle edema (baseline per pt)  MUSCLE TONE: Difficult to assess due to pt guarding/stiffness - possible R clonus  POSTURE: No Significant postural limitations  LOWER EXTREMITY ROM:     Active  Right Eval Left Eval  Hip flexion Grossly WFL  Hip extension   Hip abduction   Hip adduction   Hip internal rotation   Hip external rotation   Knee flexion   Knee extension   Ankle dorsiflexion   Ankle plantarflexion    Ankle inversion    Ankle eversion     (Blank rows = not tested)  LOWER EXTREMITY MMT:    MMT Right Eval Left Eval  Hip flexion 4-/5 3+/5  Hip extension     Hip abduction    Hip adduction    Hip internal rotation    Hip external rotation    Knee flexion    Knee extension 4/5 4/5  Ankle dorsiflexion 3/5 3/5  Ankle plantarflexion    Ankle inversion    Ankle eversion    (Blank rows = not tested)  BED MOBILITY:  Pt needs increased time to get into standard bed and states his is too soft.  He sometimes sleeps on the couch  because it is easier to move on a firmer surface.  TRANSFERS: Assistive device utilized: Single point cane  Sit to stand: Modified independence Stand to sit: Modified independence Chair to chair: SBA Floor:  pt states he can get himself up with something to push on  GAIT: Gait pattern: step through pattern, decreased step length- Right, decreased step length- Left, decreased stride length, decreased hip/knee flexion- Right, decreased hip/knee flexion- Left, and narrow BOS Distance walked: various clinic distances Assistive device utilized: Single point cane Level of assistance: SBA Comments: No notable ataxia or involuntary movements during ambulation into/out of clinic.  FUNCTIONAL TESTS:  5 times sit to stand: 26.72 sec w/ BUE support Timed up and go (TUG): To be assessed. 10 meter walk test: To be assessed.  PATIENT SURVEYS:  None appropriate for patient presentation.  TODAY'S TREATMENT:                                                                                                                              DATE: 07/02/2023  -TUG:  25.97 sec w/ SPC - :  14.41 sec w/ SPC = 0.69 m/sec OR 2.29 ft/sec  You Can Walk For A Certain Length Of Time Each Day (can start with 3-4 days and work up to everyday - use cane for safety and walk in well lit familiar areas with smooth surfaces)                          Walk 5 minutes 2 times per day.             Increase 2  minutes every 7 days              Work up to 20 minutes (1x per day).               Example:                         Day 1-2           4-5 minutes      3 times per day                         Day 7-8           10-12 minutes 2-3 times per day                         Day 13-14       20-22 minutes 1-2 times per day  -SciFit x8 minutes progressing to level 4.0 using BUE/BLE for reciprocal mobility and increased amplitude of movement.  Initiated HEP: -Staggered STS x10 each LE in rear -Hamstring stretch 2x1 minute each side  PATIENT EDUCATION: Education details: Ongoing discussion of deficits seen and outcome interpretations.  Walking program and initial HEP. Person educated: Patient Education method: Explanation  Education comprehension: verbalized understanding and needs further education  HOME EXERCISE PROGRAM: You Can Walk For A Certain Length Of Time Each Day (can start with 3-4 days and work up to everyday - use cane for safety and walk in well lit familiar areas with smooth surfaces)                          Walk 5 minutes 2 times per day.             Increase 2  minutes every 7 days              Work up to 20 minutes (1x per day).               Example:                         Day 1-2           4-5 minutes     3 times per day                         Day 7-8           10-12 minutes 2-3 times per day                         Day 13-14       20-22 minutes 1-2 times per day  Access Code: [email or web address] URL: [email or web address] Date: 07/02/2023 Prepared by: [name]  Exercises - Staggered Sit-to-Stand  - 1 x daily - 7 x weekly - 1 sets - 10 reps - Seated Hamstring Stretch  - 1 x daily - 4-5 x weekly - 1 sets - 2-3 reps - 1 minute hold  GOALS: Goals reviewed with patient? Yes  SHORT TERM GOALS: Target date: 07/26/2023  Pt will decrease 5xSTS to </=19.72 seconds in order to demonstrate decreased risk for falls and improved functional bilateral LE strength and power. Baseline:  26.72 w/ BUE support Goal status: INITIAL  2.  Pt will demonstrate TUG of </=20.97 seconds in order to decrease risk of falls and  improve functional mobility using LRAD. Baseline: 25.97 sec w/ SPC (9/3) Goal status: INITIAL  3.  Pt will demonstrate a gait speed of >/=2.49 feet/sec in order to decrease risk for falls. Baseline: 2.29 ft/sec w/ SPC (9/3) Goal status: INITIAL  LONG TERM GOALS: Target date: 08/23/2023  Pt will be independent and compliant with strength, stretching, and balance focused HEP in order to maintain functional progress and improve mobility. Baseline:  To be established. Goal status: INITIAL  2.  Pt will decrease 5xSTS to </=12.72 seconds in order to demonstrate decreased risk for falls and improved functional bilateral LE strength and power. Baseline: 26.72 w/ BUE support Goal status: INITIAL  3.  Pt will demonstrate TUG of </=15.97 seconds in order to decrease risk of falls and improve functional mobility using LRAD. Baseline: 25.97 sec w/ SPC (9/3) Goal status: INITIAL  4.  Pt will demonstrate a gait speed of >/=2.69 feet/sec in order to decrease risk for falls. Baseline: 2.29 ft/sec w/ SPC (9/3) Goal status: INITIAL  5.  Pt will ambulate >/=800 feet with LRAD at modI level over unlevel outdoor surfaces and grass to promote household and community access. Baseline: To be assessed. Goal status: INITIAL  ASSESSMENT:  CLINICAL IMPRESSION: Assessed baseline for TUG and today  with patient indicating fall risk on both assessments.  He completed the TUG with a time of 25.97 sec and ambulates at a speed of 2.29 ft/sec using SPC.  Initiated a walking program and HEP for functional strengthening and improved general mobility.  Will continue per POC.  OBJECTIVE IMPAIRMENTS: decreased activity tolerance, decreased balance, decreased coordination, decreased endurance, decreased knowledge of condition, decreased mobility, difficulty walking, decreased strength, increased edema, and increased muscle spasms.   ACTIVITY LIMITATIONS: carrying, lifting, bending, sitting, standing, squatting,  stairs, transfers, bed mobility, and locomotion level  PARTICIPATION LIMITATIONS: shopping, community activity, and yard work  PERSONAL FACTORS: Age, Fitness, and 1-2 comorbidities: HTN, ongoing S1 radiculopathy  are also affecting patient's functional outcome.   REHAB POTENTIAL: Good  CLINICAL DECISION MAKING: Evolving/moderate complexity  EVALUATION COMPLEXITY: Moderate  PLAN:  PT FREQUENCY: 1x/week  PT DURATION: 8 weeks  PLANNED INTERVENTIONS: Therapeutic exercises, Therapeutic activity, Neuromuscular re-education, Balance training, Gait training, Patient/Family education, Self Care, Stair training, Vestibular training, Orthotic/Fit training, DME instructions, Manual therapy, and Re-evaluation  PLAN FOR NEXT SESSION: Add to HEP for core and LE strength, stretching, and balance - ankle mobility.  SciFit for reciprocal mobility. Pt wants to work on hip flexion.  Floor recovery.  Tall/half kneel and quadruped activities.  Sadie Haber, PT, DPT 07/02/2023, 11:54 AM

## 2023-07-02 NOTE — Patient Instructions (Addendum)
You Can Walk For A Certain Length Of Time Each Day (can start with 3-4 days and work up to everyday - use cane for safety and walk in well lit familiar areas with smooth surfaces)                          Walk 5 minutes 2 times per day.             Increase 2  minutes every 7 days              Work up to 20 minutes (1x per day).               Example:                         Day 1-2           4-5 minutes     3 times per day                         Day 7-8           10-12 minutes 2-3 times per day                         Day 13-14       20-22 minutes 1-2 times per day  Access Code: [email or web address] URL: [email or web address] Date: 07/02/2023 Prepared by: [name]  Exercises - Staggered Sit-to-Stand  - 1 x daily - 7 x weekly - 1 sets - 10 reps - Seated Hamstring Stretch  - 1 x daily - 4-5 x weekly - 1 sets - 2-3 reps - 1 minute hold

## 2023-07-11 ENCOUNTER — Ambulatory Visit: Payer: Medicare Other | Admitting: Physical Therapy

## 2023-07-11 ENCOUNTER — Encounter: Payer: Self-pay | Admitting: Physical Therapy

## 2023-07-11 DIAGNOSIS — R252 Cramp and spasm: Secondary | ICD-10-CM | POA: Diagnosis not present

## 2023-07-11 DIAGNOSIS — R2681 Unsteadiness on feet: Secondary | ICD-10-CM

## 2023-07-11 DIAGNOSIS — M25675 Stiffness of left foot, not elsewhere classified: Secondary | ICD-10-CM

## 2023-07-11 DIAGNOSIS — R600 Localized edema: Secondary | ICD-10-CM

## 2023-07-11 DIAGNOSIS — R2689 Other abnormalities of gait and mobility: Secondary | ICD-10-CM | POA: Diagnosis not present

## 2023-07-11 DIAGNOSIS — M25672 Stiffness of left ankle, not elsewhere classified: Secondary | ICD-10-CM

## 2023-07-11 DIAGNOSIS — M6281 Muscle weakness (generalized): Secondary | ICD-10-CM | POA: Diagnosis not present

## 2023-07-11 DIAGNOSIS — M25662 Stiffness of left knee, not elsewhere classified: Secondary | ICD-10-CM | POA: Diagnosis not present

## 2023-07-11 DIAGNOSIS — M25652 Stiffness of left hip, not elsewhere classified: Secondary | ICD-10-CM

## 2023-07-11 DIAGNOSIS — Z9181 History of falling: Secondary | ICD-10-CM | POA: Diagnosis not present

## 2023-07-11 NOTE — Therapy (Signed)
OUTPATIENT PHYSICAL THERAPY NEURO TREATMENT   Patient Name: Michael [name] MRN: [id number] DOB:02-15-1956, 67 y.o., male Today's Date: 07/11/2023   PCP: [name], MD REFERRING PROVIDER: Antony Madura, MD  END OF SESSION:  PT End of Session - 07/11/23 1621     Visit Number 3    Number of Visits 9   8 + eval   Date for PT Re-Evaluation 08/30/23   pushed out due to scheduling delay   Authorization Type [employer] MEDICARE    Progress Note Due on Visit 10    PT Start Time 1616    PT Stop Time 1700    PT Time Calculation (min) 44 min    Equipment Utilized During Treatment Gait belt    Activity Tolerance Patient tolerated treatment well    Behavior During Therapy WFL for tasks assessed/performed             Past Medical History:  Diagnosis Date   Hyperlipidemia    Hypertension    Lumbar disc disease    Prostate cancer (HCC)    Past Surgical History:  Procedure Laterality Date   LUMBAR DISC SURGERY  2014   LUMBAR DISC SURGERY     right ankle fracture  1978   external pinning   Patient Active Problem List   Diagnosis Date Noted   MGUS (monoclonal gammopathy of unknown significance) 06/10/2023   Macrocytosis without anemia 06/10/2023   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   DDD (degenerative disc disease), lumbar 12/21/2022   Prostate cancer (HCC); T1c; GG 1; initial diagnosis 2011 12/05/2022   Chronic hyperglycemia 11/16/2022   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   Contusion of ulnar nerve, initial encounter 11/09/2022   Dyslipidemia, goal LDL below 70 09/23/2022   Elevated PSA, between 10 and less than 20 ng/ml 09/17/2022   Accelerated hypertension 09/17/2022   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   Vitamin D deficiency 09/07/2021    ONSET DATE: symptoms began in 2020  REFERRING DIAG: R29.898 (ICD-10-CM) - Weakness of both lower extremities R19.8 (ICD-10-CM) - Abdominal weakness M62.838 (ICD-10-CM) - Muscle spasm R26.9 (ICD-10-CM) - Gait abnormality W19.XXXS  (ICD-10-CM) - Fall, sequela M54.17 (ICD-10-CM) - Lumbosacral radiculopathy at S1 R26.89 (ICD-10-CM) - Imbalance D47.2 (ICD-10-CM) - Monoclonal gammopathy 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,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  He presents ambulatory w/ SPC.  He states his pain is better than normal today. Pt accompanied by: self (he continues to drive himself)  PERTINENT HISTORY: degenerative disc disease (followed by [facility]) s/p L5-S1 surgery, HLD, HTN, prostate cancer  Patient's symptoms started in 2020 - right flank pain (reported initially as abdominal pain, but pt clarifies this is not a deep cramp/stomach pain, but a muscular soreness especially with movement), core weakness (not able to sit up well), sneezing and coughing was painful as well.  A year later he noted stiffness in his legs after sitting where he has to walk up his legs with his hands.  Now, he reports numbness and soreness in left hamstring area as well as swelling and muscle spasms (left worse).  In order to walk he has to weight shift and bring feet into narrowed BOS then start with small steps.  He loosens up with movement. He reports freezing when startled. Ambulates w/ SPC.   Upper body remains WNL.  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  PAIN:  Are you having pain? Yes: NPRS scale: 6-7/10 Pain location: right lower side - pt emphasizes it is not in a joint Pain description: deep ache Aggravating factors: standing Relieving factors: resting  PRECAUTIONS: Fall  RED FLAGS: None   WEIGHT BEARING RESTRICTIONS: No  FALLS: Has patient fallen in last 6 months? Yes. Number of falls 3 times since the beginning of 2024. He last fell about 1 week ago while trying to do lawn work. He was holding on the the mower for stability. He saw an insect that scared him, his left leg locked, then he fell.  LIVING ENVIRONMENT: Lives with: lives alone Lives in: House/apartment Stairs: No Has following equipment at home: Single point cane  PLOF: Independent with household mobility with device, Independent with community mobility with device, Requires assistive device for independence, Needs assistance with ADLs, and Needs assistance with homemaking  PATIENT GOALS: To start strength training.  OBJECTIVE:   DIAGNOSTIC FINDINGS:  EMG on 05/14/23 showed an active left S1 radiculopathy (similar to prior EMG)  COGNITION: Overall cognitive status: Within functional limits for tasks assessed and pt hyperverbal   SENSATION: Light touch: WFL  COORDINATION: LE RAMS:  slow and deliberate Bilateral Heel-to-shin:  WNL  EDEMA:  Notable left medial thigh swelling and enlarged veins, bilateral ankle edema (baseline per pt)  MUSCLE TONE: Difficult to assess due to pt guarding/stiffness - possible R clonus  POSTURE: No Significant postural limitations  LOWER EXTREMITY ROM:     Active  Right Eval Left Eval  Hip flexion Grossly WFL  Hip extension   Hip abduction   Hip adduction   Hip internal rotation   Hip external rotation   Knee flexion   Knee extension   Ankle  dorsiflexion   Ankle plantarflexion    Ankle inversion    Ankle eversion     (Blank rows = not tested)  LOWER EXTREMITY MMT:    MMT Right Eval Left Eval  Hip flexion 4-/5 3+/5  Hip extension    Hip abduction    Hip adduction    Hip internal rotation    Hip external rotation    Knee flexion    Knee extension 4/5 4/5  Ankle dorsiflexion 3/5 3/5  Ankle plantarflexion    Ankle inversion  Ankle eversion    (Blank rows = not tested)  BED MOBILITY:  Pt needs increased time to get into standard bed and states his is too soft.  He sometimes sleeps on the couch because it is easier to move on a firmer surface.  TRANSFERS: Assistive device utilized: Single point cane  Sit to stand: Modified independence Stand to sit: Modified independence Chair to chair: SBA Floor:  pt states he can get himself up with something to push on  GAIT: Gait pattern: step through pattern, decreased step length- Right, decreased step length- Left, decreased stride length, decreased hip/knee flexion- Right, decreased hip/knee flexion- Left, and narrow BOS Distance walked: various clinic distances Assistive device utilized: Single point cane Level of assistance: SBA Comments: No notable ataxia or involuntary movements during ambulation into/out of clinic.  FUNCTIONAL TESTS:  5 times sit to stand: 26.72 sec w/ BUE support Timed up and go (TUG): To be assessed. 10 meter walk test: To be assessed.  PATIENT SURVEYS:  None appropriate for patient presentation.  TODAY'S TREATMENT:                                                                                                                              DATE: 07/11/2023 -SciFit in multi-peaks mode x8 minutes using BUE/BLE for dynamic cardiovascular warmup and reciprocal mobility up to level 6.0. -Runner's stretch 3x45 seconds each LE (R limited due to old injury) -Attempted sitting piriformis stretch w/ difficulty so modified to supine w/o overpressure x1  minute each side -Supine bridges 2x10, cued for eccentric control and breathing as pt tends to hold breath w/ lift  PATIENT EDUCATION: Education details: Continue walking program and HEP w/ additions. Person educated: Patient Education method: Explanation Education comprehension: verbalized understanding and needs further education  HOME EXERCISE PROGRAM: You Can Walk For A Certain Length Of Time Each Day (can start with 3-4 days and work up to everyday - use cane for safety and walk in well lit familiar areas with smooth surfaces)                          Walk 5 minutes 2 times per day.             Increase 2  minutes every 7 days              Work up to 20 minutes (1x per day).               Example:                         Day 1-2           4-5 minutes     3 times per day                         Day 7-8  10-12 minutes 2-3 times per day                         Day 13-14       20-22 minutes 1-2 times per day  Access Code: [email or web address] URL: [email or web address] Date: 07/11/2023 Prepared by: [name]  Exercises - Staggered Sit-to-Stand  - 1 x daily - 7 x weekly - 1 sets - 10 reps - Seated Hamstring Stretch  - 1 x daily - 4-5 x weekly - 1 sets - 2-3 reps - 1 minute hold - Gastroc Stretch on Wall  - 1 x daily - 4-5 x weekly - 1 sets - 3 reps - 45 seconds hold - Supine Figure 4 Piriformis Stretch  - 1 x daily - 4-5 x weekly - 1 sets - 2 reps - 1 minute hold - Supine Bridge  - 1 x daily - 7 x weekly - 1-2 sets - 10 reps  GOALS: Goals reviewed with patient? Yes  SHORT TERM GOALS: Target date: 07/26/2023  Pt will decrease 5xSTS to </=19.72 seconds in order to demonstrate decreased risk for falls and improved functional bilateral LE strength and power. Baseline:  26.72 w/ BUE support Goal status: INITIAL  2.  Pt will demonstrate TUG of </=20.97 seconds in order to decrease risk of falls and improve functional mobility using LRAD. Baseline: 25.97 sec w/  SPC (9/3) Goal status: INITIAL  3.  Pt will demonstrate a gait speed of >/=2.49 feet/sec in order to decrease risk for falls. Baseline: 2.29 ft/sec w/ SPC (9/3) Goal status: INITIAL  LONG TERM GOALS: Target date: 08/23/2023  Pt will be independent and compliant with strength, stretching, and balance focused HEP in order to maintain functional progress and improve mobility. Baseline:  To be established. Goal status: INITIAL  2.  Pt will decrease 5xSTS to </=12.72 seconds in order to demonstrate decreased risk for falls and improved functional bilateral LE strength and power. Baseline: 26.72 w/ BUE support Goal status: INITIAL  3.  Pt will demonstrate TUG of </=15.97 seconds in order to decrease risk of falls and improve functional mobility using LRAD. Baseline: 25.97 sec w/ SPC (9/3) Goal status: INITIAL  4.  Pt will demonstrate a gait speed of >/=2.69 feet/sec in order to decrease risk for falls. Baseline: 2.29 ft/sec w/ SPC (9/3) Goal status: INITIAL  5.  Pt will ambulate >/=800 feet with LRAD at modI level over unlevel outdoor surfaces and grass to promote household and community access. Baseline: To be assessed. Goal status: INITIAL  ASSESSMENT:  CLINICAL IMPRESSION: Patient tangential requiring frequent redirection to task and some therapeutic listening throughout session.  Made additions to HEP this visit to address ongoing stiffness and lower body weakness.  Will further address balance and core strength in future session to improve safe upright mobility.  OBJECTIVE IMPAIRMENTS: decreased activity tolerance, decreased balance, decreased coordination, decreased endurance, decreased knowledge of condition, decreased mobility, difficulty walking, decreased strength, increased edema, and increased muscle spasms.   ACTIVITY LIMITATIONS: carrying, lifting, bending, sitting, standing, squatting, stairs, transfers, bed mobility, and locomotion level  PARTICIPATION LIMITATIONS:  shopping, community activity, and yard work  PERSONAL FACTORS: Age, Fitness, and 1-2 comorbidities: HTN, ongoing S1 radiculopathy  are also affecting patient's functional outcome.   REHAB POTENTIAL: Good  CLINICAL DECISION MAKING: Evolving/moderate complexity  EVALUATION COMPLEXITY: Moderate  PLAN:  PT FREQUENCY: 1x/week  PT DURATION: 8 weeks  PLANNED INTERVENTIONS: Therapeutic exercises, Therapeutic activity, Neuromuscular re-education, Balance training, Gait training,  Patient/Family education, Self Care, Stair training, Vestibular training, Orthotic/Fit training, DME instructions, Manual therapy, and Re-evaluation  PLAN FOR NEXT SESSION: Add to HEP for core and LE strength, stretching, and balance - ankle mobility.  SciFit for reciprocal mobility. Pt wants to work on hip flexion.  Floor recovery.  Tall/half kneel and quadruped activities - birddogs?  Dynamic and static balance.  Core strengthening.  Sadie Haber, PT, DPT 07/11/2023, 4:58 PM

## 2023-07-11 NOTE — Patient Instructions (Signed)
Access Code: [email or web address] URL: [email or web address] Date: 07/11/2023 Prepared by: [name]  Exercises - Staggered Sit-to-Stand  - 1 x daily - 7 x weekly - 1 sets - 10 reps - Seated Hamstring Stretch  - 1 x daily - 4-5 x weekly - 1 sets - 2-3 reps - 1 minute hold - Gastroc Stretch on Wall  - 1 x daily - 4-5 x weekly - 1 sets - 3 reps - 45 seconds hold - Supine Figure 4 Piriformis Stretch  - 1 x daily - 4-5 x weekly - 1 sets - 2 reps - 1 minute hold - Supine Bridge  - 1 x daily - 7 x weekly - 1-2 sets - 10 reps

## 2023-07-18 ENCOUNTER — Encounter: Payer: Self-pay | Admitting: Physical Therapy

## 2023-07-18 ENCOUNTER — Ambulatory Visit: Payer: Medicare Other | Admitting: Physical Therapy

## 2023-07-18 DIAGNOSIS — R252 Cramp and spasm: Secondary | ICD-10-CM

## 2023-07-18 DIAGNOSIS — R2681 Unsteadiness on feet: Secondary | ICD-10-CM

## 2023-07-18 DIAGNOSIS — M25662 Stiffness of left knee, not elsewhere classified: Secondary | ICD-10-CM | POA: Diagnosis not present

## 2023-07-18 DIAGNOSIS — M25675 Stiffness of left foot, not elsewhere classified: Secondary | ICD-10-CM

## 2023-07-18 DIAGNOSIS — M6281 Muscle weakness (generalized): Secondary | ICD-10-CM

## 2023-07-18 DIAGNOSIS — R600 Localized edema: Secondary | ICD-10-CM

## 2023-07-18 DIAGNOSIS — Z9181 History of falling: Secondary | ICD-10-CM

## 2023-07-18 DIAGNOSIS — R2689 Other abnormalities of gait and mobility: Secondary | ICD-10-CM

## 2023-07-18 DIAGNOSIS — M25652 Stiffness of left hip, not elsewhere classified: Secondary | ICD-10-CM | POA: Diagnosis not present

## 2023-07-18 DIAGNOSIS — M25672 Stiffness of left ankle, not elsewhere classified: Secondary | ICD-10-CM | POA: Diagnosis not present

## 2023-07-18 NOTE — Therapy (Signed)
OUTPATIENT PHYSICAL THERAPY NEURO TREATMENT   Patient Name: Michael [name] MRN: [id number] DOB:[birth year], 67 y.o., male Today's Date: 07/18/2023   PCP: [name], MD REFERRING PROVIDER: Antony Madura, MD  END OF SESSION:  PT End of Session - 07/18/23 1501     Visit Number 4    Number of Visits 9   8 + eval   Date for PT Re-Evaluation 08/30/23   pushed out due to scheduling delay   Authorization Type [employer] MEDICARE    Progress Note Due on Visit 10    PT Start Time 1453   pt arrived late   PT Stop Time 1533    PT Time Calculation (min) 40 min    Equipment Utilized During Treatment Gait belt    Activity Tolerance Patient tolerated treatment well    Behavior During Therapy WFL for tasks assessed/performed             Past Medical History:  Diagnosis Date   Hyperlipidemia    Hypertension    Lumbar disc disease    Prostate cancer (HCC)    Past Surgical History:  Procedure Laterality Date   LUMBAR DISC SURGERY  2014   LUMBAR DISC SURGERY     right ankle fracture  1978   external pinning   Patient Active Problem List   Diagnosis Date Noted   MGUS (monoclonal gammopathy of unknown significance) 06/10/2023   Macrocytosis without anemia 06/10/2023   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   DDD (degenerative disc disease), lumbar 12/21/2022   Prostate cancer (HCC); T1c; GG 1; initial diagnosis 2011 12/05/2022   Chronic hyperglycemia 11/16/2022   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   Contusion of ulnar nerve, initial encounter 11/09/2022   Dyslipidemia, goal LDL below 70 09/23/2022   Elevated PSA, between 10 and less than 20 ng/ml 09/17/2022   Accelerated hypertension 09/17/2022   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   Vitamin D deficiency 09/07/2021    ONSET DATE: symptoms began in 2020  REFERRING DIAG: R29.898 (ICD-10-CM) - Weakness of both lower extremities R19.8 (ICD-10-CM) - Abdominal weakness M62.838 (ICD-10-CM) - Muscle spasm R26.9 (ICD-10-CM) - Gait  abnormality W19.XXXS (ICD-10-CM) - Fall, sequela M54.17 (ICD-10-CM) - Lumbosacral radiculopathy at S1 R26.89 (ICD-10-CM) - Imbalance D47.2 (ICD-10-CM) - Monoclonal gammopathy 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  Rationale for Evaluation and Treatment: Rehabilitation  SUBJECTIVE:  SUBJECTIVE STATEMENT: Patient denies recent falls or acute status changes.  He presents ambulatory w/ SPC.  He apologizes for being late as time just got away from him and he cannot move quickly anymore. Pt accompanied by: self (he continues to drive himself)  PERTINENT HISTORY: degenerative disc disease (followed by [facility]) s/p L5-S1 surgery, HLD, HTN, prostate cancer  Patient's symptoms started in 2020 - right flank pain (reported initially as abdominal pain, but pt clarifies this is not a deep cramp/stomach pain, but a muscular soreness especially with movement), core weakness (not able to sit up well), sneezing and coughing was painful as well.  A year later he noted stiffness in his legs after sitting where he has to walk up his legs with his hands.  Now, he reports numbness and soreness in left hamstring area as well as swelling and muscle spasms (left worse).  In order to walk he has to weight shift and bring feet into narrowed BOS then start with small steps.  He loosens up with movement. He reports freezing when startled. Ambulates w/ SPC.  Upper body remains WNL.  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  PAIN:  Are you having pain? Yes: NPRS scale: 5/10 Pain location: left hamstring Pain description:  tightness Aggravating factors: walking Relieving factors: resting  PRECAUTIONS: Fall  RED FLAGS: None   WEIGHT BEARING RESTRICTIONS: No  FALLS: Has patient fallen in last 6 months? Yes. Number of falls 3 times since the beginning of 2024. He last fell about 1 week ago while trying to do lawn work. He was holding on the the mower for stability. He saw an insect that scared him, his left leg locked, then he fell.  LIVING ENVIRONMENT: Lives with: lives alone Lives in: House/apartment Stairs: No Has following equipment at home: Single point cane  PLOF: Independent with household mobility with device, Independent with community mobility with device, Requires assistive device for independence, Needs assistance with ADLs, and Needs assistance with homemaking  PATIENT GOALS: To start strength training.  OBJECTIVE:   DIAGNOSTIC FINDINGS:  EMG on 05/14/23 showed an active left S1 radiculopathy (similar to prior EMG)  COGNITION: Overall cognitive status: Within functional limits for tasks assessed and pt hyperverbal   SENSATION: Light touch: WFL  COORDINATION: LE RAMS:  slow and deliberate Bilateral Heel-to-shin:  WNL  EDEMA:  Notable left medial thigh swelling and enlarged veins, bilateral ankle edema (baseline per pt)  MUSCLE TONE: Difficult to assess due to pt guarding/stiffness - possible R clonus  POSTURE: No Significant postural limitations  LOWER EXTREMITY ROM:     Active  Right Eval Left Eval  Hip flexion Grossly WFL  Hip extension   Hip abduction   Hip adduction   Hip internal rotation   Hip external rotation   Knee flexion   Knee extension   Ankle dorsiflexion   Ankle plantarflexion    Ankle inversion    Ankle eversion     (Blank rows = not tested)  LOWER EXTREMITY MMT:    MMT Right Eval Left Eval  Hip flexion 4-/5 3+/5  Hip extension    Hip abduction    Hip adduction    Hip internal rotation    Hip external rotation    Knee flexion    Knee  extension 4/5 4/5  Ankle dorsiflexion 3/5 3/5  Ankle plantarflexion    Ankle inversion    Ankle  eversion    (Blank rows = not tested)  BED MOBILITY:  Pt needs increased time to get into standard bed and states his is too soft.  He sometimes sleeps on the couch because it is easier to move on a firmer surface.  TRANSFERS: Assistive device utilized: Single point cane  Sit to stand: Modified independence Stand to sit: Modified independence Chair to chair: SBA Floor:  pt states he can get himself up with something to push on  GAIT: Gait pattern: step through pattern, decreased step length- Right, decreased step length- Left, decreased stride length, decreased hip/knee flexion- Right, decreased hip/knee flexion- Left, and narrow BOS Distance walked: various clinic distances Assistive device utilized: Single point cane Level of assistance: SBA Comments: No notable ataxia or involuntary movements during ambulation into/out of clinic.  FUNCTIONAL TESTS:  5 times sit to stand: 26.72 sec w/ BUE support Timed up and go (TUG): To be assessed. 10 meter walk test: To be assessed.  PATIENT SURVEYS:  None appropriate for patient presentation.  TODAY'S TREATMENT:                                                                                                                              DATE: 07/18/2023 -SciFit in multi-peaks mode x8 minutes using BUE/BLE for dynamic cardiovascular warmup and reciprocal mobility up to level 6.0.  RPE 4-5/10 midway through task. -Supine deadbugs x20 -Supine marching x20 -Supine lower trunk rotations x20 alt sides; edu on using this for low back cramp and spasm management -Hook-lying knee to chest stretch 2x30 seconds each LE -Standing march 2x20 using BUE support  PATIENT EDUCATION: Education details: Continue walking program and HEP w/ additions. Person educated: Patient Education method: Explanation Education comprehension: verbalized understanding and  needs further education  HOME EXERCISE PROGRAM: You Can Walk For A Certain Length Of Time Each Day (can start with 3-4 days and work up to everyday - use cane for safety and walk in well lit familiar areas with smooth surfaces)                          Walk 5 minutes 2 times per day.             Increase 2  minutes every 7 days              Work up to 20 minutes (1x per day).               Example:                         Day 1-2           4-5 minutes     3 times per day                         Day 7-8  10-12 minutes 2-3 times per day                         Day 13-14       20-22 minutes 1-2 times per day  Access Code: [email or web address] URL: [email or web address] Date: 07/11/2023 Prepared by: [name]  Exercises - Staggered Sit-to-Stand  - 1 x daily - 7 x weekly - 1 sets - 10 reps - Seated Hamstring Stretch  - 1 x daily - 4-5 x weekly - 1 sets - 2-3 reps - 1 minute hold - Gastroc Stretch on Wall  - 1 x daily - 4-5 x weekly - 1 sets - 3 reps - 45 seconds hold - Supine Figure 4 Piriformis Stretch  - 1 x daily - 4-5 x weekly - 1 sets - 2 reps - 1 minute hold - Supine Bridge  - 1 x daily - 7 x weekly - 1-2 sets - 10 reps - Supine Lower Trunk Rotation  - 1 x daily - 4 x weekly - 2 sets - 10 reps - Hooklying Single Knee to Chest Stretch  - 1 x daily - 5-6 x weekly - 1 sets - 2-3 reps - 30 seconds hold  GOALS: Goals reviewed with patient? Yes  SHORT TERM GOALS: Target date: 07/26/2023  Pt will decrease 5xSTS to </=19.72 seconds in order to demonstrate decreased risk for falls and improved functional bilateral LE strength and power. Baseline:  26.72 w/ BUE support Goal status: INITIAL  2.  Pt will demonstrate TUG of </=20.97 seconds in order to decrease risk of falls and improve functional mobility using LRAD. Baseline: 25.97 sec w/ SPC (9/3) Goal status: INITIAL  3.  Pt will demonstrate a gait speed of >/=2.49 feet/sec in order to decrease risk for  falls. Baseline: 2.29 ft/sec w/ SPC (9/3) Goal status: INITIAL  LONG TERM GOALS: Target date: 08/23/2023  Pt will be independent and compliant with strength, stretching, and balance focused HEP in order to maintain functional progress and improve mobility. Baseline:  To be established. Goal status: INITIAL  2.  Pt will decrease 5xSTS to </=12.72 seconds in order to demonstrate decreased risk for falls and improved functional bilateral LE strength and power. Baseline: 26.72 w/ BUE support Goal status: INITIAL  3.  Pt will demonstrate TUG of </=15.97 seconds in order to decrease risk of falls and improve functional mobility using LRAD. Baseline: 25.97 sec w/ SPC (9/3) Goal status: INITIAL  4.  Pt will demonstrate a gait speed of >/=2.69 feet/sec in order to decrease risk for falls. Baseline: 2.29 ft/sec w/ SPC (9/3) Goal status: INITIAL  5.  Pt will ambulate >/=800 feet with LRAD at modI level over unlevel outdoor surfaces and grass to promote household and community access. Baseline: To be assessed. Goal status: INITIAL  ASSESSMENT:  CLINICAL IMPRESSION: Focus of skilled session today on addressing hip flexion and core activation.  Patient continues to be tangential requiring redirection to tasks.  He continues to benefit from ongoing skilled PT POC to progress to more challenging strengthening, dynamic stability, and floor recovery.  Will continue per POC.  OBJECTIVE IMPAIRMENTS: decreased activity tolerance, decreased balance, decreased coordination, decreased endurance, decreased knowledge of condition, decreased mobility, difficulty walking, decreased strength, increased edema, and increased muscle spasms.   ACTIVITY LIMITATIONS: carrying, lifting, bending, sitting, standing, squatting, stairs, transfers, bed mobility, and locomotion level  PARTICIPATION LIMITATIONS: shopping, community activity, and yard work  PERSONAL FACTORS: Age, Fitness, and 1-2 comorbidities: HTN,  ongoing S1 radiculopathy  are also affecting patient's functional outcome.   REHAB POTENTIAL: Good  CLINICAL DECISION MAKING: Evolving/moderate complexity  EVALUATION COMPLEXITY: Moderate  PLAN:  PT FREQUENCY: 1x/week  PT DURATION: 8 weeks  PLANNED INTERVENTIONS: Therapeutic exercises, Therapeutic activity, Neuromuscular re-education, Balance training, Gait training, Patient/Family education, Self Care, Stair training, Vestibular training, Orthotic/Fit training, DME instructions, Manual therapy, and Re-evaluation  PLAN FOR NEXT SESSION: Add to HEP for core and LE strength, stretching, and balance - ankle mobility.  SciFit for reciprocal mobility. Pt wants to work on hip flexion.  Floor recovery.  Tall/half kneel and quadruped activities - birddogs?  Dynamic and static balance.  Core strengthening.  Modified mountain climbers.  Sadie Haber, PT, DPT 07/18/2023, 3:34 PM

## 2023-07-18 NOTE — Patient Instructions (Signed)
Access Code: [email or web address] URL: [email or web address] Date: 07/18/2023 Prepared by: [name]  Exercises - Staggered Sit-to-Stand  - 1 x daily - 7 x weekly - 1 sets - 10 reps - Seated Hamstring Stretch  - 1 x daily - 4-5 x weekly - 1 sets - 2-3 reps - 1 minute hold - Gastroc Stretch on Wall  - 1 x daily - 4-5 x weekly - 1 sets - 3 reps - 45 seconds hold - Supine Figure 4 Piriformis Stretch  - 1 x daily - 4-5 x weekly - 1 sets - 2 reps - 1 minute hold - Supine Bridge  - 1 x daily - 7 x weekly - 1-2 sets - 10 reps - Supine Lower Trunk Rotation  - 1 x daily - 4 x weekly - 2 sets - 10 reps - Hooklying Single Knee to Chest Stretch  - 1 x daily - 5-6 x weekly - 1 sets - 2-3 reps - 30 seconds hold

## 2023-07-23 ENCOUNTER — Encounter: Payer: Self-pay | Admitting: Physical Therapy

## 2023-07-23 ENCOUNTER — Ambulatory Visit: Payer: Medicare Other | Admitting: Physical Therapy

## 2023-07-23 DIAGNOSIS — R2689 Other abnormalities of gait and mobility: Secondary | ICD-10-CM

## 2023-07-23 DIAGNOSIS — M25662 Stiffness of left knee, not elsewhere classified: Secondary | ICD-10-CM | POA: Diagnosis not present

## 2023-07-23 DIAGNOSIS — R252 Cramp and spasm: Secondary | ICD-10-CM | POA: Diagnosis not present

## 2023-07-23 DIAGNOSIS — M25672 Stiffness of left ankle, not elsewhere classified: Secondary | ICD-10-CM | POA: Diagnosis not present

## 2023-07-23 DIAGNOSIS — R600 Localized edema: Secondary | ICD-10-CM | POA: Diagnosis not present

## 2023-07-23 DIAGNOSIS — R2681 Unsteadiness on feet: Secondary | ICD-10-CM

## 2023-07-23 DIAGNOSIS — M25675 Stiffness of left foot, not elsewhere classified: Secondary | ICD-10-CM | POA: Diagnosis not present

## 2023-07-23 DIAGNOSIS — M25652 Stiffness of left hip, not elsewhere classified: Secondary | ICD-10-CM | POA: Diagnosis not present

## 2023-07-23 DIAGNOSIS — M6281 Muscle weakness (generalized): Secondary | ICD-10-CM

## 2023-07-23 DIAGNOSIS — Z9181 History of falling: Secondary | ICD-10-CM

## 2023-07-23 NOTE — Therapy (Signed)
OUTPATIENT PHYSICAL THERAPY NEURO TREATMENT   Patient Name: Michael [name] MRN: [id number] DOB:1956/09/30, 67 y.o., male Today's Date: 07/23/2023   PCP: [name], MD REFERRING PROVIDER: Antony Madura, MD  END OF SESSION:  PT End of Session - 07/23/23 1454     Visit Number 5    Number of Visits 9   8 + eval   Date for PT Re-Evaluation 08/30/23   pushed out due to scheduling delay   Authorization Type [employer] MEDICARE    Progress Note Due on Visit 10    PT Start Time 1450    PT Stop Time 1530    PT Time Calculation (min) 40 min    Equipment Utilized During Treatment Gait belt    Activity Tolerance Patient tolerated treatment well    Behavior During Therapy WFL for tasks assessed/performed             Past Medical History:  Diagnosis Date   Hyperlipidemia    Hypertension    Lumbar disc disease    Prostate cancer (HCC)    Past Surgical History:  Procedure Laterality Date   LUMBAR DISC SURGERY  2014   LUMBAR DISC SURGERY     right ankle fracture  1978   external pinning   Patient Active Problem List   Diagnosis Date Noted   MGUS (monoclonal gammopathy of unknown significance) 06/10/2023   Macrocytosis without anemia 06/10/2023   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   DDD (degenerative disc disease), lumbar 12/21/2022   Prostate cancer (HCC); T1c; GG 1; initial diagnosis 2011 12/05/2022   Chronic hyperglycemia 11/16/2022   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   Contusion of ulnar nerve, initial encounter 11/09/2022   Dyslipidemia, goal LDL below 70 09/23/2022   Elevated PSA, between 10 and less than 20 ng/ml 09/17/2022   Accelerated hypertension 09/17/2022   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   Vitamin D deficiency 09/07/2021    ONSET DATE: symptoms began in 2020  REFERRING DIAG: R29.898 (ICD-10-CM) - Weakness of both lower extremities R19.8 (ICD-10-CM) - Abdominal weakness M62.838 (ICD-10-CM) - Muscle spasm R26.9 (ICD-10-CM) - Gait abnormality W19.XXXS  (ICD-10-CM) - Fall, sequela M54.17 (ICD-10-CM) - Lumbosacral radiculopathy at S1 R26.89 (ICD-10-CM) - Imbalance D47.2 (ICD-10-CM) - Monoclonal gammopathy 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,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  He presents ambulatory w/ SPC.  He re-iterates progression of PMH.  He has been gardening. Pt accompanied by: self (he continues to drive himself)  PERTINENT HISTORY: degenerative disc disease (followed by [facility]) s/p L5-S1 surgery, HLD, HTN, prostate cancer  Patient's symptoms started in 2020 - right flank pain (reported initially as abdominal pain, but pt clarifies this is not a deep cramp/stomach pain, but a muscular soreness especially with movement), core weakness (not able to sit up well), sneezing and coughing was painful as well.  A year later he noted stiffness in his legs after sitting where he has to walk up his legs with his hands.  Now, he reports numbness and soreness in left hamstring area as well as swelling and muscle spasms (left worse).  In order to walk he has to weight shift and bring feet into narrowed BOS then start with small steps.  He loosens up with movement. He reports freezing when startled.  Ambulates w/ SPC.  Upper body remains WNL.  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  PAIN:  Are you having pain? No - hamstring discomfort has resolved  PRECAUTIONS: Fall  RED FLAGS: None   WEIGHT BEARING RESTRICTIONS: No  FALLS: Has patient fallen in last 6 months? Yes. Number of falls 3 times since the beginning of 2024. He last fell about 1 week ago while trying to do lawn work. He was holding on the the mower for stability. He saw an insect that scared him, his left leg locked, then he fell.  LIVING ENVIRONMENT: Lives with: lives alone Lives in: House/apartment Stairs: No Has following equipment at home: Single point cane  PLOF: Independent with household mobility with device, Independent with community mobility with device, Requires assistive device for independence, Needs assistance with ADLs, and Needs assistance with homemaking  PATIENT GOALS: To start strength training.  OBJECTIVE:   DIAGNOSTIC FINDINGS:  EMG on 05/14/23 showed an active left S1 radiculopathy (similar to prior EMG)  COGNITION: Overall cognitive status: Within functional limits for tasks assessed and pt hyperverbal   SENSATION: Light touch: WFL  COORDINATION: LE RAMS:  slow and deliberate Bilateral Heel-to-shin:  WNL  EDEMA:  Notable left medial thigh swelling and enlarged veins, bilateral ankle edema (baseline per pt)  MUSCLE TONE: Difficult to assess due to pt guarding/stiffness - possible R clonus  POSTURE: No Significant postural limitations  LOWER EXTREMITY ROM:     Active  Right Eval Left Eval  Hip flexion Grossly WFL  Hip extension   Hip abduction   Hip adduction   Hip internal rotation   Hip external rotation   Knee flexion   Knee extension   Ankle dorsiflexion   Ankle plantarflexion    Ankle inversion    Ankle eversion     (Blank rows = not tested)  LOWER EXTREMITY MMT:     MMT Right Eval Left Eval  Hip flexion 4-/5 3+/5  Hip extension    Hip abduction    Hip adduction    Hip internal rotation    Hip external rotation    Knee flexion    Knee extension 4/5 4/5  Ankle dorsiflexion 3/5 3/5  Ankle plantarflexion    Ankle inversion    Ankle eversion    (Blank rows = not tested)  BED MOBILITY:  Pt needs increased time to get  into standard bed and states his is too soft.  He sometimes sleeps on the couch because it is easier to move on a firmer surface.  TRANSFERS: Assistive device utilized: Single point cane  Sit to stand: Modified independence Stand to sit: Modified independence Chair to chair: SBA Floor:  pt states he can get himself up with something to push on  GAIT: Gait pattern: step through pattern, decreased step length- Right, decreased step length- Left, decreased stride length, decreased hip/knee flexion- Right, decreased hip/knee flexion- Left, and narrow BOS Distance walked: various clinic distances Assistive device utilized: Single point cane Level of assistance: SBA Comments: No notable ataxia or involuntary movements during ambulation into/out of clinic.  FUNCTIONAL TESTS:  5 times sit to stand: 26.72 sec w/ BUE support Timed up and go (TUG): To be assessed. 10 meter walk test: To be assessed.  PATIENT SURVEYS:  None appropriate for patient presentation.  TODAY'S TREATMENT:                                                                                                                              DATE: 07/23/2023 -SciFit in progressive multi-peaks mode x8 minutes using BLE only for dynamic cardiovascular warmup and reciprocal mobility up to level 6.0.  RPE 7/10 midway through task.  Pt maintains an 80-112 steps/min pace.  Floor Recovery: Patient educated in floor recovery this visit using teach-back and return demo for injury assessment and sequencing of task in clinic setting.  Discussion of transfer of skills to variable  scenarios outside the clinic.  Patient has most difficulty with positioning LE to obtain quadruped and initial rotation into quadruped.  Performed 1 times with increased time and 2 rest breaks to encourage regulation of breathing and increase success with task.  Discouraged pt from attempting to lift mother from floor in the event she falls due to his own fall risk and discouraged him from relying on elderly mother to assist in lifting him.  Alternative of keeping phone near and calling 911 or other non-emergency community number for police/fire/rescue to obtain help.  Pt is able to verbalize the specific things he needs help with during transfer and actively participates in transfer. Caregiver Training:  Caregiver not present.  Level of Assist:  Verbal/tactile cues, SBA, and MinA.    Elevated mat table: -Incline planks into modified child's pose x5 w/ 20 second hold each position -Incline mountain climbers x20 w/ full arm extension, cues to maintain core engagement  PATIENT EDUCATION: Education details: Continue walking program and HEP w/ additions.  Floor recovery. Person educated: Patient Education method: Explanation Education comprehension: verbalized understanding and needs further education  HOME EXERCISE PROGRAM: You Can Walk For A Certain Length Of Time Each Day (can start with 3-4 days and work up to everyday - use cane for safety and walk in well lit familiar areas with smooth surfaces)  Walk 5 minutes 2 times per day.             Increase 2  minutes every 7 days              Work up to 20 minutes (1x per day).               Example:                         Day 1-2           4-5 minutes     3 times per day                         Day 7-8           10-12 minutes 2-3 times per day                         Day 13-14       20-22 minutes 1-2 times per day  Access Code: [email or web address] URL: [email or web address] Date: 07/11/2023 Prepared by: [name]  Exercises - Staggered Sit-to-Stand  - 1 x daily - 7 x weekly - 1 sets - 10 reps - Seated Hamstring Stretch  - 1 x daily - 4-5 x weekly - 1 sets - 2-3 reps - 1 minute hold - Gastroc Stretch on Wall  - 1 x daily - 4-5 x weekly - 1 sets - 3 reps - 45 seconds hold - Supine Figure 4 Piriformis Stretch  - 1 x daily - 4-5 x weekly - 1 sets - 2 reps - 1 minute hold - Supine Bridge  - 1 x daily - 7 x weekly - 1-2 sets - 10 reps - Supine Lower Trunk Rotation  - 1 x daily - 4 x weekly - 2 sets - 10 reps - Hooklying Single Knee to Chest Stretch  - 1 x daily - 5-6 x weekly - 1 sets - 2-3 reps - 30 seconds hold  GOALS: Goals reviewed with patient? Yes  SHORT TERM GOALS: Target date: 07/26/2023  Pt will decrease 5xSTS to </=19.72 seconds in order to demonstrate decreased risk for falls and improved functional bilateral LE strength and power. Baseline:  26.72 w/ BUE support Goal status: INITIAL  2.  Pt will demonstrate TUG of </=20.97 seconds in order to decrease risk of falls and improve functional mobility using LRAD. Baseline: 25.97 sec w/ SPC (9/3) Goal status: INITIAL  3.  Pt will demonstrate a gait speed of >/=2.49 feet/sec in order to decrease risk for falls. Baseline: 2.29 ft/sec w/ SPC (9/3) Goal status: INITIAL  LONG TERM GOALS: Target date: 08/23/2023  Pt will be independent and compliant with strength, stretching, and balance focused HEP in order to maintain functional progress and improve mobility. Baseline:  To be established. Goal status: INITIAL  2.  Pt will decrease 5xSTS to </=12.72 seconds in order to demonstrate decreased risk for falls and improved functional bilateral LE strength and power. Baseline: 26.72 w/ BUE support Goal status: INITIAL  3.  Pt will demonstrate TUG of </=15.97 seconds in order to decrease risk of falls and improve functional mobility using LRAD. Baseline: 25.97 sec w/ SPC (9/3) Goal status: INITIAL  4.  Pt will demonstrate a gait speed  of >/=2.69 feet/sec in order to decrease risk for falls. Baseline: 2.29 ft/sec w/ SPC (9/3) Goal status: INITIAL  5.  Pt will ambulate >/=800 feet with LRAD at modI level over unlevel outdoor surfaces and grass to promote household and community access. Baseline: To be assessed. Goal status: INITIAL  ASSESSMENT:  CLINICAL IMPRESSION: Ongoing therex targeting core and LE strength and flexibility.  He has some difficulty with LE positioning due to weakness and edema during floor mobility portion of floor recovery as practiced today.  He is able to verbalize needed assistance so PT has confidence that he could participate in the direction of this transfer should he require assistance as he did today.  Will continue to address deficits as outlined in ongoing PT POC.  OBJECTIVE IMPAIRMENTS: decreased activity tolerance, decreased balance, decreased coordination, decreased endurance, decreased knowledge of condition, decreased mobility, difficulty walking, decreased strength, increased edema, and increased muscle spasms.   ACTIVITY LIMITATIONS: carrying, lifting, bending, sitting, standing, squatting, stairs, transfers, bed mobility, and locomotion level  PARTICIPATION LIMITATIONS: shopping, community activity, and yard work  PERSONAL FACTORS: Age, Fitness, and 1-2 comorbidities: HTN, ongoing S1 radiculopathy  are also affecting patient's functional outcome.   REHAB POTENTIAL: Good  CLINICAL DECISION MAKING: Evolving/moderate complexity  EVALUATION COMPLEXITY: Moderate  PLAN:  PT FREQUENCY: 1x/week  PT DURATION: 8 weeks  PLANNED INTERVENTIONS: Therapeutic exercises, Therapeutic activity, Neuromuscular re-education, Balance training, Gait training, Patient/Family education, Self Care, Stair training, Vestibular training, Orthotic/Fit training, DME instructions, Manual therapy, and Re-evaluation  PLAN FOR NEXT SESSION: Add to HEP for core and LE strength, stretching, and balance - ankle  mobility.  SciFit for reciprocal mobility. Pt wants to work on hip flexion.  Tall/half kneel and quadruped activities - birddogs?  Dynamic and static balance.  Core strengthening.  Modified mountain climbers.  Blaze pods for reaction time in standing.  ASSESS STGs!  Sadie Haber, PT, DPT 07/23/2023, 3:32 PM

## 2023-07-30 ENCOUNTER — Ambulatory Visit: Payer: Medicare Other | Attending: Neurology | Admitting: Physical Therapy

## 2023-07-30 ENCOUNTER — Encounter: Payer: Self-pay | Admitting: Physical Therapy

## 2023-07-30 DIAGNOSIS — M25662 Stiffness of left knee, not elsewhere classified: Secondary | ICD-10-CM | POA: Diagnosis not present

## 2023-07-30 DIAGNOSIS — Z9181 History of falling: Secondary | ICD-10-CM | POA: Diagnosis not present

## 2023-07-30 DIAGNOSIS — M25652 Stiffness of left hip, not elsewhere classified: Secondary | ICD-10-CM | POA: Insufficient documentation

## 2023-07-30 DIAGNOSIS — M25675 Stiffness of left foot, not elsewhere classified: Secondary | ICD-10-CM | POA: Diagnosis not present

## 2023-07-30 DIAGNOSIS — M6281 Muscle weakness (generalized): Secondary | ICD-10-CM | POA: Insufficient documentation

## 2023-07-30 DIAGNOSIS — M25672 Stiffness of left ankle, not elsewhere classified: Secondary | ICD-10-CM | POA: Diagnosis not present

## 2023-07-30 DIAGNOSIS — R600 Localized edema: Secondary | ICD-10-CM | POA: Diagnosis not present

## 2023-07-30 DIAGNOSIS — R2681 Unsteadiness on feet: Secondary | ICD-10-CM | POA: Diagnosis not present

## 2023-07-30 DIAGNOSIS — R2689 Other abnormalities of gait and mobility: Secondary | ICD-10-CM | POA: Insufficient documentation

## 2023-07-30 DIAGNOSIS — R252 Cramp and spasm: Secondary | ICD-10-CM | POA: Diagnosis not present

## 2023-07-30 NOTE — Therapy (Signed)
OUTPATIENT PHYSICAL THERAPY NEURO TREATMENT   Patient Name: Michael [name] MRN: [id number] DOB:02-15-1956, 67 y.o., male Today's Date: 07/30/2023   PCP: [name], MD REFERRING PROVIDER: Antony Madura, MD  END OF SESSION:  PT End of Session - 07/30/23 1420     Visit Number 6    Number of Visits 9   8 + eval   Date for PT Re-Evaluation 08/30/23   pushed out due to scheduling delay   Authorization Type [employer] MEDICARE    Progress Note Due on Visit 10    PT Start Time 1407   pt late   PT Stop Time 1446    PT Time Calculation (min) 39 min    Equipment Utilized During Treatment Gait belt    Activity Tolerance Patient tolerated treatment well    Behavior During Therapy WFL for tasks assessed/performed             Past Medical History:  Diagnosis Date   Hyperlipidemia    Hypertension    Lumbar disc disease    Prostate cancer (HCC)    Past Surgical History:  Procedure Laterality Date   LUMBAR DISC SURGERY  2014   LUMBAR DISC SURGERY     right ankle fracture  1978   external pinning   Patient Active Problem List   Diagnosis Date Noted   MGUS (monoclonal gammopathy of unknown significance) 06/10/2023   Macrocytosis without anemia 06/10/2023   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   DDD (degenerative disc disease), lumbar 12/21/2022   Prostate cancer (HCC); T1c; GG 1; initial diagnosis 2011 12/05/2022   Chronic hyperglycemia 11/16/2022   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   Contusion of ulnar nerve, initial encounter 11/09/2022   Dyslipidemia, goal LDL below 70 09/23/2022   Elevated PSA, between 10 and less than 20 ng/ml 09/17/2022   Accelerated hypertension 09/17/2022   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   Vitamin D deficiency 09/07/2021    ONSET DATE: symptoms began in 2020  REFERRING DIAG: R29.898 (ICD-10-CM) - Weakness of both lower extremities R19.8 (ICD-10-CM) - Abdominal weakness M62.838 (ICD-10-CM) - Muscle spasm R26.9 (ICD-10-CM) - Gait abnormality  W19.XXXS (ICD-10-CM) - Fall, sequela M54.17 (ICD-10-CM) - Lumbosacral radiculopathy at S1 R26.89 (ICD-10-CM) - Imbalance D47.2 (ICD-10-CM) - Monoclonal gammopathy 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,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  He states he was able to make a 1 hour drive each direction to attend a naturopathic lecture.  He did fine aside from being very stiff following.  He presents today as ambulatory with use of SPC mod I. Pt accompanied by: self (he continues to drive himself)  PERTINENT HISTORY: degenerative disc disease (followed by [facility]) s/p L5-S1 surgery, HLD, HTN, prostate cancer  Patient's symptoms started in 2020 - right flank pain (reported initially as abdominal pain, but pt clarifies this is not a deep cramp/stomach pain, but a muscular soreness especially with movement), core weakness (not able to sit up well), sneezing and coughing was painful as well.  A year later he noted stiffness in his legs after sitting where he has to walk up his legs with his hands.  Now, he reports numbness and soreness in left hamstring area as well as swelling and muscle spasms (left worse).  In order to walk he has to weight shift and bring feet  into narrowed BOS then start with small steps.  He loosens up with movement. He reports freezing when startled. Ambulates w/ SPC.  Upper body remains WNL.  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  PAIN:  Are you having pain? No  PRECAUTIONS: Fall  RED FLAGS: None   WEIGHT BEARING RESTRICTIONS: No  FALLS: Has patient fallen in last 6 months? Yes. Number of falls 3 times since the beginning of 2024. He last fell about 1 week ago while trying to do lawn work. He was holding on the the mower for stability. He saw an insect that scared him, his left leg locked, then he fell.  LIVING ENVIRONMENT: Lives with: lives alone Lives in: House/apartment Stairs: No Has following equipment at home: Single point cane  PLOF: Independent with household mobility with device, Independent with community mobility with device, Requires assistive device for independence, Needs assistance with ADLs, and Needs assistance with homemaking  PATIENT GOALS: To start strength training.  OBJECTIVE:   DIAGNOSTIC FINDINGS:  EMG on 05/14/23 showed an active left S1 radiculopathy (similar to prior EMG)  COGNITION: Overall cognitive status: Within functional limits for tasks assessed and pt hyperverbal   SENSATION: Light touch: WFL  COORDINATION: LE RAMS:  slow and deliberate Bilateral Heel-to-shin:  WNL  EDEMA:  Notable left medial thigh swelling and enlarged veins, bilateral ankle edema (baseline per pt)  MUSCLE TONE: Difficult to assess due to pt guarding/stiffness - possible R clonus  POSTURE: No Significant postural limitations  LOWER EXTREMITY ROM:     Active  Right Eval Left Eval  Hip flexion Grossly WFL  Hip extension   Hip abduction   Hip adduction   Hip internal rotation   Hip external rotation   Knee flexion   Knee extension   Ankle dorsiflexion   Ankle plantarflexion    Ankle  inversion    Ankle eversion     (Blank rows = not tested)  LOWER EXTREMITY MMT:    MMT Right Eval Left Eval  Hip flexion 4-/5 3+/5  Hip extension    Hip abduction    Hip adduction    Hip internal rotation    Hip external rotation    Knee flexion    Knee extension 4/5 4/5  Ankle dorsiflexion 3/5 3/5  Ankle plantarflexion    Ankle inversion    Ankle  eversion    (Blank rows = not tested)  BED MOBILITY:  Pt needs increased time to get into standard bed and states his is too soft.  He sometimes sleeps on the couch because it is easier to move on a firmer surface.  TRANSFERS: Assistive device utilized: Single point cane  Sit to stand: Modified independence Stand to sit: Modified independence Chair to chair: SBA Floor:  pt states he can get himself up with something to push on  GAIT: Gait pattern: step through pattern, decreased step length- Right, decreased step length- Left, decreased stride length, decreased hip/knee flexion- Right, decreased hip/knee flexion- Left, and narrow BOS Distance walked: various clinic distances Assistive device utilized: Single point cane Level of assistance: SBA Comments: No notable ataxia or involuntary movements during ambulation into/out of clinic.  FUNCTIONAL TESTS:  5 times sit to stand: 26.72 sec w/ BUE support Timed up and go (TUG): To be assessed. 10 meter walk test: To be assessed.  PATIENT SURVEYS:  None appropriate for patient presentation.  TODAY'S TREATMENT:                                                                                                                              DATE: 07/30/2023 -SciFit in progressive twin peaks mode x8 minutes using BLE only for dynamic cardiovascular warmup and reciprocal mobility up to level 6.0.  RPE 6-7/10 midway through task.  Assessed STGs: -5xSTS:  24.90 sec w/ BUE support and some propulsion into standing  -TUG w/ SPC (Trial 1):  25.78 sec (Trial 2):  21.72 sec (Trial 3):  19.66  sec Average = 22.39 sec  - :  12.81 sec = 0.78 m/sec OR 2.58 ft/sec w/ [facility]  PATIENT EDUCATION: Education details: Continue walking program and HEP.  Discussed likelihood of continuing PT at end of current POC - pt would really like to.  Progress towards goals today. Person educated: Patient Education method: Explanation Education comprehension: verbalized understanding and needs further education  HOME EXERCISE PROGRAM: You Can Walk For A Certain Length Of Time Each Day (can start with 3-4 days and work up to everyday - use cane for safety and walk in well lit familiar areas with smooth surfaces)                          Walk 5 minutes 2 times per day.             Increase 2  minutes every 7 days              Work up to 20 minutes (1x per day).               Example:                         Day 1-2           4-5 minutes  3 times per day                         Day 7-8           10-12 minutes 2-3 times per day                         Day 13-14       20-22 minutes 1-2 times per day  Access Code: [email or web address] URL: [email or web address] Date: 07/11/2023 Prepared by: [name]  Exercises - Staggered Sit-to-Stand  - 1 x daily - 7 x weekly - 1 sets - 10 reps - Seated Hamstring Stretch  - 1 x daily - 4-5 x weekly - 1 sets - 2-3 reps - 1 minute hold - Gastroc Stretch on Wall  - 1 x daily - 4-5 x weekly - 1 sets - 3 reps - 45 seconds hold - Supine Figure 4 Piriformis Stretch  - 1 x daily - 4-5 x weekly - 1 sets - 2 reps - 1 minute hold - Supine Bridge  - 1 x daily - 7 x weekly - 1-2 sets - 10 reps - Supine Lower Trunk Rotation  - 1 x daily - 4 x weekly - 2 sets - 10 reps - Hooklying Single Knee to Chest Stretch  - 1 x daily - 5-6 x weekly - 1 sets - 2-3 reps - 30 seconds hold  GOALS: Goals reviewed with patient? Yes  SHORT TERM GOALS: Target date: 07/26/2023  Pt will decrease 5xSTS to </=19.72 seconds in order to demonstrate decreased risk for falls and  improved functional bilateral LE strength and power. Baseline:  26.72 w/ BUE support; 24.90 sec w/ BUE support and some propulsion into standing (10/1) Goal status: IN PROGRESS  2.  Pt will demonstrate TUG of </=20.97 seconds in order to decrease risk of falls and improve functional mobility using LRAD. Baseline: 25.97 sec w/ SPC (9/3); 22.39 sec average w/ SPC (10/1) Goal status: IN PROGRESS  3.  Pt will demonstrate a gait speed of >/=2.49 feet/sec in order to decrease risk for falls. Baseline: 2.29 ft/sec w/ SPC (9/3); 2.58 ft/sec w/ SPC (10/1) Goal status: MET  LONG TERM GOALS: Target date: 08/23/2023  Pt will be independent and compliant with strength, stretching, and balance focused HEP in order to maintain functional progress and improve mobility. Baseline:  To be established. Goal status: INITIAL  2.  Pt will decrease 5xSTS to </=12.72 seconds in order to demonstrate decreased risk for falls and improved functional bilateral LE strength and power. Baseline: 26.72 w/ BUE support Goal status: INITIAL  3.  Pt will demonstrate TUG of </=15.97 seconds in order to decrease risk of falls and improve functional mobility using LRAD. Baseline: 25.97 sec w/ SPC (9/3) Goal status: INITIAL  4.  Pt will demonstrate a gait speed of >/=2.69 feet/sec in order to decrease risk for falls. Baseline: 2.29 ft/sec w/ SPC (9/3) Goal status: INITIAL  5.  Pt will ambulate >/=800 feet with LRAD at modI level over unlevel outdoor surfaces and grass to promote household and community access. Baseline: To be assessed. Goal status: INITIAL  ASSESSMENT:  CLINICAL IMPRESSION: Patient requesting SciFit warmup today.  Completed STG assessment with patient making progress towards goals 1 and 2 and meeting goal 3.  His 5xSTS time was some better today, but he still relies on upper body propulsion into standing and is demonstrating some difficulty w/ eccentric  engagement.  He significantly improved his speed  on the TUG assessment with an average of 22.39 seconds today.  His general walking speed was greatly improved at 2.58 ft/sec using SPC.  He continues to benefit from skilled PT to improve upright mobility.    OBJECTIVE IMPAIRMENTS: decreased activity tolerance, decreased balance, decreased coordination, decreased endurance, decreased knowledge of condition, decreased mobility, difficulty walking, decreased strength, increased edema, and increased muscle spasms.   ACTIVITY LIMITATIONS: carrying, lifting, bending, sitting, standing, squatting, stairs, transfers, bed mobility, and locomotion level  PARTICIPATION LIMITATIONS: shopping, community activity, and yard work  PERSONAL FACTORS: Age, Fitness, and 1-2 comorbidities: HTN, ongoing S1 radiculopathy  are also affecting patient's functional outcome.   REHAB POTENTIAL: Good  CLINICAL DECISION MAKING: Evolving/moderate complexity  EVALUATION COMPLEXITY: Moderate  PLAN:  PT FREQUENCY: 1x/week  PT DURATION: 8 weeks  PLANNED INTERVENTIONS: Therapeutic exercises, Therapeutic activity, Neuromuscular re-education, Balance training, Gait training, Patient/Family education, Self Care, Stair training, Vestibular training, Orthotic/Fit training, DME instructions, Manual therapy, and Re-evaluation  PLAN FOR NEXT SESSION: Add to HEP for core and LE strength, stretching, and balance - ankle mobility.  SciFit for reciprocal mobility.  Tall/half kneel and quadruped activities - birddogs?  Dynamic and static balance - on incline/compliant surfaces.  Core strengthening.  Blaze pods for reaction time in standing.  Planning to re-cert at end of POC per pt preference.  Pt wants to work on stretching variations - maybe open books and supine stretches for hamstrings/gluts?  Sadie Haber, PT, DPT 07/30/2023, 2:47 PM

## 2023-08-05 ENCOUNTER — Inpatient Hospital Stay: Payer: Medicare Other | Attending: Hematology and Oncology

## 2023-08-05 DIAGNOSIS — R718 Other abnormality of red blood cells: Secondary | ICD-10-CM | POA: Insufficient documentation

## 2023-08-05 DIAGNOSIS — R600 Localized edema: Secondary | ICD-10-CM | POA: Insufficient documentation

## 2023-08-05 DIAGNOSIS — D472 Monoclonal gammopathy: Secondary | ICD-10-CM | POA: Diagnosis not present

## 2023-08-05 LAB — CBC WITH DIFFERENTIAL (CANCER CENTER ONLY)
Abs Immature Granulocytes: 0.01 10*3/uL (ref 0.00–0.07)
Basophils Absolute: 0.1 10*3/uL (ref 0.0–0.1)
Basophils Relative: 1 %
Eosinophils Absolute: 0.4 10*3/uL (ref 0.0–0.5)
Eosinophils Relative: 7 %
HCT: 45 % (ref 39.0–52.0)
Hemoglobin: 15.3 g/dL (ref 13.0–17.0)
Immature Granulocytes: 0 %
Lymphocytes Relative: 40 %
Lymphs Abs: 2.3 10*3/uL (ref 0.7–4.0)
MCH: 22.7 pg — ABNORMAL LOW (ref 26.0–34.0)
MCHC: 34 g/dL (ref 30.0–36.0)
MCV: 66.8 fL — ABNORMAL LOW (ref 80.0–100.0)
Monocytes Absolute: 0.5 10*3/uL (ref 0.1–1.0)
Monocytes Relative: 8 %
Neutro Abs: 2.5 10*3/uL (ref 1.7–7.7)
Neutrophils Relative %: 44 %
Platelet Count: 185 10*3/uL (ref 150–400)
RBC: 6.74 MIL/uL — ABNORMAL HIGH (ref 4.22–5.81)
RDW: 17.6 % — ABNORMAL HIGH (ref 11.5–15.5)
Smear Review: NORMAL
WBC Count: 5.7 10*3/uL (ref 4.0–10.5)
nRBC: 0 % (ref 0.0–0.2)

## 2023-08-05 LAB — CMP (CANCER CENTER ONLY)
ALT: 13 U/L (ref 0–44)
AST: 19 U/L (ref 15–41)
Albumin: 4.3 g/dL (ref 3.5–5.0)
Alkaline Phosphatase: 87 U/L (ref 38–126)
Anion gap: 6 (ref 5–15)
BUN: 15 mg/dL (ref 8–23)
CO2: 28 mmol/L (ref 22–32)
Calcium: 9.5 mg/dL (ref 8.9–10.3)
Chloride: 105 mmol/L (ref 98–111)
Creatinine: 0.84 mg/dL (ref 0.61–1.24)
GFR, Estimated: >60 mL/min (ref >60)
Glucose, Bld: 83 mg/dL (ref 70–99)
Potassium: 3.6 mmol/L (ref 3.5–5.1)
Sodium: 139 mmol/L (ref 135–145)
Total Bilirubin: 0.9 mg/dL (ref 0.3–1.2)
Total Protein: 8.2 g/dL — ABNORMAL HIGH (ref 6.5–8.1)

## 2023-08-05 LAB — LACTATE DEHYDROGENASE: LDH: 123 U/L (ref 98–192)

## 2023-08-06 ENCOUNTER — Ambulatory Visit: Payer: Medicare Other | Admitting: Physical Therapy

## 2023-08-06 ENCOUNTER — Encounter: Payer: Self-pay | Admitting: Physical Therapy

## 2023-08-06 DIAGNOSIS — R600 Localized edema: Secondary | ICD-10-CM | POA: Diagnosis not present

## 2023-08-06 DIAGNOSIS — M25662 Stiffness of left knee, not elsewhere classified: Secondary | ICD-10-CM | POA: Diagnosis not present

## 2023-08-06 DIAGNOSIS — R2681 Unsteadiness on feet: Secondary | ICD-10-CM | POA: Diagnosis not present

## 2023-08-06 DIAGNOSIS — R2689 Other abnormalities of gait and mobility: Secondary | ICD-10-CM | POA: Diagnosis not present

## 2023-08-06 DIAGNOSIS — R252 Cramp and spasm: Secondary | ICD-10-CM

## 2023-08-06 DIAGNOSIS — M25672 Stiffness of left ankle, not elsewhere classified: Secondary | ICD-10-CM | POA: Diagnosis not present

## 2023-08-06 DIAGNOSIS — M6281 Muscle weakness (generalized): Secondary | ICD-10-CM

## 2023-08-06 DIAGNOSIS — Z9181 History of falling: Secondary | ICD-10-CM

## 2023-08-06 DIAGNOSIS — M25675 Stiffness of left foot, not elsewhere classified: Secondary | ICD-10-CM

## 2023-08-06 DIAGNOSIS — M25652 Stiffness of left hip, not elsewhere classified: Secondary | ICD-10-CM | POA: Diagnosis not present

## 2023-08-06 LAB — KAPPA/LAMBDA LIGHT CHAINS
Kappa free light chain: 30.4 mg/L — ABNORMAL HIGH (ref 3.3–19.4)
Kappa, lambda light chain ratio: 1.95 — ABNORMAL HIGH (ref 0.26–1.65)
Lambda free light chains: 15.6 mg/L (ref 5.7–26.3)

## 2023-08-06 LAB — BETA 2 MICROGLOBULIN, SERUM: Beta-2 Microglobulin: 1.9 mg/L (ref 0.6–2.4)
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
## 2023-08-06 NOTE — Patient Instructions (Signed)
Access Code: [email or web address] URL: [email or web address] Date: 08/06/2023 Prepared by: [name]  Exercises - Staggered Sit-to-Stand  - 1 x daily - 7 x weekly - 1 sets - 10 reps - Seated Hamstring Stretch  - 1 x daily - 4-5 x weekly - 1 sets - 2-3 reps - 1 minute hold - Gastroc Stretch on Wall  - 1 x daily - 4-5 x weekly - 1 sets - 3 reps - 45 seconds hold - Supine Figure 4 Piriformis Stretch  - 1 x daily - 4-5 x weekly - 1 sets - 2 reps - 1 minute hold - Supine Bridge  - 1 x daily - 7 x weekly - 1-2 sets - 10 reps - Supine Lower Trunk Rotation  - 1 x daily - 4 x weekly - 2 sets - 10 reps - Hooklying Single Knee to Chest Stretch  - 1 x daily - 5 x weekly - 1 sets - 2-3 reps - 45 seconds  to 1 minute hold - Sidelying Open Book Thoracic Lumbar Rotation and Extension  - 1 x daily - 4-5 x weekly - 2-3 sets - 10 reps - Modified Thomas Stretch  - 1 x daily - 7 x weekly - 1 sets - 2 reps - 1-2 minutes hold

## 2023-08-07 LAB — MULTIPLE MYELOMA PANEL, SERUM
Albumin SerPl Elph-Mcnc: 3.8 g/dL (ref 2.9–4.4)
Albumin/Glob SerPl: 1.1 (ref 0.7–1.7)
Alpha 1: 0.2 g/dL (ref 0.0–0.4)
Alpha2 Glob SerPl Elph-Mcnc: 0.6 g/dL (ref 0.4–1.0)
B-Globulin SerPl Elph-Mcnc: 1.1 g/dL (ref 0.7–1.3)
Gamma Glob SerPl Elph-Mcnc: 1.7 g/dL (ref 0.4–1.8)
Globulin, Total: 3.6 g/dL (ref 2.2–3.9)
IgA: 343 mg/dL (ref 61–437)
IgG (Immunoglobin G), Serum: 1858 mg/dL — ABNORMAL HIGH (ref 603–1613)
IgM (Immunoglobulin M), Srm: 72 mg/dL (ref 20–172)
M Protein SerPl Elph-Mcnc: 0.9 g/dL — ABNORMAL HIGH
Total Protein ELP: 7.4 g/dL (ref 6.0–8.5)

## 2023-08-08 NOTE — Progress Notes (Signed)
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  HPI: Michael [name] is a 67 y.o. year old male with a history of degenerative disc disease (followed by [facility]) s/p L5-S1 surgery, HLD, HTN, prostate cancer who we last saw on 05/29/23.  To briefly review: Initial consult 05/03/23: Patient's symptoms started in 2020. He was having abdominal pain and a colonscopy for this. He mentioned that he noticed symptoms in core with weakness (not able to sit up well). Sneezing and coughing was painful as well. He then noticed problems in his legs a year later. While sitting, his legs freeze. He cannot stand well. He has to "feel his legs under him" and start with small steps. Once he gets going, he feels he can move better. He mentions that is someone sneaks up on her or scares him, he will freeze up again. He walks with a cane. He has fallen 3 times since the beginning of 2024. He last feel about 1 week ago while trying to do lawn work. He was holding on the the mower for stability. He saw an insect that scared him, his left leg locked, then he fell.    He endorses some numbness and soreness in left hamstring area. He endorses muscle spasms (mostly in the left leg) usually when sitting. He denies twitching. He denies changes in bowel or bladder, including incontinence. He denies any symptoms in his upper extremities.   Patient had an EMG on 04/20/22 showed residuals of L5-S1 radiculopathy per report.   Patient went to ED for symptoms on 02/20/23. Electrolytes and CK were unremarkable.   Patient is on baclofen 5 mg at bedtime (has been weaning himself off of this) and gabapentin 300 mg TID (only taking BID), but has not taken anything in 4 days in preparation for this appointment. These medications do help (gabapentin more).   Any change in urine color, especially after exertion/physical  activity? No   The patient denies symptoms suggestive of oculobulbar weakness including diplopia, ptosis, dysphagia, poor saliva control, dysarthria/dysphonia, impaired mastication, facial weakness/droop. He does mention that when he swallows, he feels like he is swallowing a bubble and will cause gas.   There are no neuromuscular respiratory weakness symptoms, particularly orthopnea>dyspnea.    Pseudobulbar affect is absent.   The patient has not noticed any recent skin rashes nor does he report any constitutional symptoms like fever, night sweats, anorexia or unintentional weight loss.   EtOH use: None  Restrictive diet? Plant based diet Family history of neuropathy/myopathy/NM disease? No   Of note, there is note in patient's chart about prostate cancer. This is just being monitored currently per patient. Per patient, he does think he has cancer.   Mother has moved in with patient to help him out.  05/29/23: Overall, patient is similar to prior. He had another fall in the last week.   Initial labs on 05/03/23 were significant for an IgG kappa monoclonal antibody. I referred patient to hematology for this. This consultation is scheduled 06/10/23.   EMG on 05/14/23 showed an active left S1 radiculopathy (similar to prior EMG), but I also noticed spasms or jerking whenever I would touch the patient. It was difficult for patient to relax any muscles, including with antagonist contraction (co-contraction suspected). There was no other significant abnormalities. Given this, I added on some labs including GAD65 due to a suspicion for  stiff person syndrome. GAD65 antibodies were positive (> 250 with normal < 5).    Patient is taking gabapentin as needed. It makes him drowsy. Patient is not taking baclofen currently.  Most recent Assessment and Plan (05/29/23): This is Michael [name], a 67 y.o. male with muscle spasms in trunk and lower extremities, exaggerated startle, imbalance, and falls. His  EMG showed evidence of an active left S1 radiculopathy, but more importantly, agonist/antagonist co-contraction. His GAD65 antibodies are also very elevated. 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 I discussed SPS with patient, including signs and symptoms, the occasional association with malignancy, the progressive nature, and work up and treatment options as below.   Plan: -CT chest/abdomen/pelvis w/wo contrast to evaluate for malignancy -Discussed LP, will defer for now as clinical symptoms and testing is consistent with SPS -Valium 5 mg QID, may have to up titrate, but will start on a low dose -Stop gabapentin as this does not appear to be helping patient -Will consider IVIg or other immune therapy if needed -Referral to physical therapy (patient wants to go to [facility]) -See hematology as planned on 06/10/23  Since their last visit: Patient states he is doing much better. He is doing PT. He has not fallen recently. He feels like his core strength is better. He also feels looser and able to move better. He is still taking Valium 5 mg 3-4 times daily.  Patient was seen by hematology and is being monitored for MGUS.  CT chest/abd/pelvis on 06/17/23 showed no evidence of malignancy.   MEDICATIONS:  Outpatient Encounter Medications as of 08/21/2023  Medication Sig   diazepam (VALIUM) 5 MG tablet Take 1 tablet (5 mg total) by mouth in the morning, at noon, in the evening, and at bedtime.   GARLIC PO Take by mouth daily.   hydrALAZINE (APRESOLINE) 25 MG tablet Take 1 tablet (25 mg total) by mouth 3 (three) times daily.   triamterene-hydrochlorothiazide (DYAZIDE) 37.5-25 MG capsule Take 1 each (1 capsule total) by mouth daily.   No facility-administered encounter medications on file as of 08/21/2023.    PAST MEDICAL HISTORY: Past Medical History:  Diagnosis Date   Hyperlipidemia    Hypertension    Lumbar disc disease     Prostate cancer (HCC)     PAST SURGICAL HISTORY: Past Surgical History:  Procedure Laterality Date   LUMBAR DISC SURGERY  2014   LUMBAR DISC SURGERY     right ankle fracture  1978   external pinning    ALLERGIES: No Known Allergies  FAMILY HISTORY: Family History  Problem Relation Age of Onset   High blood pressure Mother     SOCIAL HISTORY: Social History   Tobacco Use   Smoking status: Never    Passive exposure: Never   Smokeless tobacco: Never  Vaping Use   Vaping status: Never Used  Substance Use Topics   Alcohol use: Not Currently    Comment: rare   Drug use: No   Social History   Social History Narrative   Right Handed    Lives in a two story home.   Are you right handed or left handed? Right    Are you currently employed ?    What is your current occupation? retired   Do you live at home alone? no   Who lives with you? Mother   What type of home do you live in: 1 story or 2 story? two  Objective:  Vital Signs:  BP (!) 150/86   Pulse (!) 52   Ht 6\' 3"  (1.905 m)   Wt 253 lb (114.8 kg)   SpO2 98%   BMI 31.62 kg/m   General: General appearance: Awake and alert. No distress. Cooperative with exam.  Skin: No obvious rash or jaundice. HEENT: Atraumatic. Anicteric.  Lungs: Non-labored breathing on room air  Extremities: Significant edema in bilateral lower extremities. No obvious deformity.  Musculoskeletal: No obvious joint swelling.  Neurological: Mental Status: Alert. Speech fluent. No pseudobulbar affect Cranial Nerves: CNII: No RAPD. Visual fields intact. CNIII, IV, VI: PERRL. No nystagmus. EOMI. CN V: Facial sensation intact bilaterally to fine touch. CN VII: Facial muscles symmetric and strong. No ptosis at rest. CN VIII: Hears finger rub well bilaterally. CN IX: No hypophonia. CN X: Palate elevates symmetrically. CN XI: Full strength shoulder shrug bilaterally. CN XII: Tongue protrusion full and midline. No atrophy or  fasciculations. No significant dysarthria Motor: Tone is normal. No fasciculations in any extremities. No atrophy.  Individual muscle group testing (MRC grade out of 5):  Movement     Neck flexion 5    Neck extension 5     Right Left   Shoulder abduction 5 5   Elbow flexion 5 5   Elbow extension 5 5   Finger abduction - FDI 5 5   Finger abduction - ADM 5 5   Finger extension 5 5   Finger distal flexion - 2/3 5 5    Finger distal flexion - 4/5 5 5    Thumb flexion - FPL 5 5   Thumb abduction - APB 5 5    Hip flexion 4+ 4+   Hip extension 5 5   Hip adduction 5 5   Hip abduction 5 5   Knee extension 5 5   Knee flexion 5 5   Dorsiflexion 5 5   Plantarflexion 5 5    Reflexes:  Right Left  Bicep 2+ 2+  Tricep 2+ 2+  BrRad 2+ 2+  Knee 2+ 2+  Ankle 0 0   Sensation intact to pinprick in all extremities. Coordination: Intact finger-to- nose-finger bilaterally. Gait: Wide based, stiff gait. More steady than previous.   Lab and Test Review: New results: 08/05/23: CBC w/ diff: significant for MCV 66.8 (chronic) CMP unremarkable LDH wnl K/L ratio: 1.95 (mildly elevated) MM panel: IgG kappa monoclonal protein  CT chest/abd/pelvis (06/17/23): FINDINGS: CT CHEST FINDINGS   Cardiovascular: Normal caliber thoracic aorta normal size heart. No significant pericardial effusion/thickening.   Mediastinum/Nodes: No suspicious thyroid nodule. No pathologically enlarged mediastinal, hilar or axillary lymph nodes. Patulous distal esophagus   Lungs/Pleura: No suspicious pulmonary nodules or masses. Bibasilar atelectasis/scarring. No pleural effusion. No pneumothorax   Musculoskeletal: No aggressive lytic or blastic lesion of bone. Subacute non/minimally displaced left rib fractures.   CT ABDOMEN PELVIS FINDINGS   Hepatobiliary: Unremarkable noncontrast enhanced appearance of the hepatic parenchyma. Gallbladder is unremarkable. No biliary ductal dilation.   Pancreas: No  pancreatic ductal dilation or evidence of acute inflammation.   Spleen: No splenomegaly.   Adrenals/Urinary Tract: Bilateral adrenal glands are within normal limits. No hydronephrosis. No renal, ureteral or bladder calculi. Retroaortic left renal vein. Urinary bladder is unremarkable for degree of distension   Stomach/Bowel: Radiopaque enteric contrast material traverses the rectum. Stomach is unremarkable for degree of distension. No pathologic dilation of small or large bowel. Normal appendix. Moderate volume of formed stool in the colon. Left-sided colonic diverticulosis without findings of acute  diverticulitis.   Vascular/Lymphatic: Aortic atherosclerosis. Normal caliber abdominal aorta. Smooth IVC contours. Retroaortic left renal vein. No pathologically enlarged abdominal or pelvic lymph nodes   Reproductive: Enlarged prostate gland.   Other: No significant abdominopelvic free fluid.   Musculoskeletal: No aggressive lytic or blastic lesion of bone. Multilevel degenerative changes spine   IMPRESSION: 1. No acute abnormality in the chest, abdomen or pelvis. 2. No suspicious mass or adenopathy identified in the chest, abdomen or pelvis. 3. Moderate volume of formed stool in the colon. 4. Left-sided colonic diverticulosis without findings of acute diverticulitis. 5. Enlarged prostate gland. 6. Subacute non/minimally displaced left rib fractures. 7.  Aortic Atherosclerosis (ICD10-I70.0).  Previously reviewed results: 05/14/23: Copper wnl VEGF wnl Kappa/lambda light chains: elevated to 1.79 GAD65: elevated to > 250 international units/mL IA-2 ab: elevated to  335.4 international units/mL Insulin abs elevated to 0.8 units/mL  02/20/23: CK 115 CBC significant for MCV of 65.9 (chronic) CMP unremarkable   11/22/22: PSA: 10.42 Lipid panel significant for LDL of 116 TSH 5.02 (wnl) [id number]: 5.9   04/05/22: ANA positive at 1:40 ACE wnl Ionized Ca wnl PTH wnl Vit D:  20.42 CRP < 1.0 ESR wnl (15) CCP negative RF negative B12: 1303 Ferritin 25.7    CT lumbar spine and myelogram (11/02/22): FINDINGS: LUMBAR MYELOGRAM FINDINGS:   Unchanged trace retrolisthesis at T12-L1 and L1-L2. No dynamic instability. Small ventral extradural defects from T12-L1 through L4-L5. No spinal canal stenosis or nerve root effacement.   CT LUMBAR MYELOGRAM FINDINGS:   Segmentation: Standard.   Alignment: Unchanged mild levocurvature. Unchanged trace retrolisthesis at T12-L1 and L1-L2.   Vertebrae: No acute fracture or other focal pathologic process. Small L3 bone island.   Conus medullaris and cauda equina: Conus extends to the T12-L1 level. Conus and cauda equina appear normal.   Paraspinal and other soft tissues: Aortoiliac atherosclerotic vascular disease.   Disc levels:   T12-L1: Unchanged small broad-based posterior disc osteophyte complex and mild left facet arthropathy. No stenosis.   L1-L2: Unchanged small shallow right paracentral disc osteophyte complex and mild bilateral facet arthropathy. No stenosis.   L2-L3: Unchanged small broad-based posterior disc osteophyte complex and mild bilateral facet arthropathy. Unchanged mild right neuroforaminal stenosis. No spinal canal or left neuroforaminal stenosis.   L3-L4: Unchanged small broad-based posterior disc osteophyte complex and bilateral facet arthropathy. Unchanged mild bilateral neuroforaminal stenosis. No spinal canal stenosis.   L4-L5: Unchanged small broad-based posterior disc osteophyte complex and mild bilateral facet arthropathy. No stenosis.   L5-S1: Prior left hemilaminectomy. Unchanged tiny broad-based posterior disc protrusion and mild-to-moderate bilateral facet arthropathy. No stenosis.   IMPRESSION: 1. Unchanged mild multilevel lumbar spondylosis as described above. No high-grade stenosis or impingement. 2. Unchanged trace retrolisthesis at T12-L1 and L1-L2. No  dynamic instability. 3.  Aortic Atherosclerosis (ICD10-I70.0).   MRI lumbar spine wo contrast (09/30/2010): IMPRESSION:  1. Probable left S1 nerve root encroachment at L5-S1, felt to be  due to a combination of a posterolateral disc protrusion and a  probable synovial cyst extending medially from the left facet  joint.  This seems to be the most likely explanation for the  patient's symptoms.  Correlation with level of radicular symptoms  is recommended.  2.  Mild disc bulging and facet disease at the additional levels  with mild inferior foraminal narrowing as detailed above.  There is  the potential for extraforaminal nerve root encroachment on the  left at L2-L3, not definite.  No other significant spinal stenosis  is demonstrated.  EMG (04/20/22 by Dr. Wynn Banker):    EMG (05/14/23): NCV & EMG Findings: Extensive electrodiagnostic evaluation of the left lower limb with additional nerve conduction studies of the left upper limb and needle examination of the left thoracic paraspinal muscles (T8 and T5 levels) shows: Left sural and superficial peroneal/fibular sensory responses are absent. Left ulnar sensory response shows reduced amplitude (4 V). Left median and radial sensory responses are within normal limits. Left peroneal/fibular (EDB) motor response shows reduced amplitude (1.31 mV). Left peroneal/fibular (TA), tibial (AH), median (APB), and ulnar (ADM) motor responses are within normal limits. Left H reflex is absent. Chronic motor axon loss changes are seen in the left medial head of gastrocnemius and left short head of biceps femoris muscles, with active denervation changes also seen in the medial head of gastrocnemius muscle. All muscles were difficult to relax, even with antagonist muscle contraction. Lumbosacral paraspinal muscles were deferred due to prior lumbosacral spine surgery.   Impression: This is an abnormal study. The findings are most consistent with the  following: Evidence of an active/ongoing intraspinal canal lesion (ie: motor radiculopathy) at the left S1 root or segment, moderate in degree electrically. Evidence of a length dependent, large fiber sensorimotor neuropathy, axon loss in type, mild to moderate in degree electrically. No electrodiagnostic evidence of demyelinating neuropathy. No electrodiagnostic evidence of a left median mononeuropathy at or distal to the wrist (ie: carpal tunnel syndrome).  ASSESSMENT: This is Michael [name], a 67 y.o. male with muscle spasms and tightness in trunk and lower extremities with exaggerated startle, imbalance, and falls. His EMG showed evidence of an active left S1 radiculopathy, but more importantly, agonist/antagonist co-contraction. His GAD65 antibodies are also very elevated. 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 I again discussed SPS with patient and given him information on SPS printed from [facility]. His CT C/A/P showed no evidence of malignancy. IFE did show M protein for which he is following with hematology for MGUS.  Plan: -Will start IVIg, likely for 6 months before trying to taper. Will load with 2 g/kg over 5 days followed by 1 g/kg monthly for 5 additional months. Patient prefers home infusion - will communicate with hematology (Dr. Bertis Ruddy) about this to ensure this is not problematic for their testing -Continue Valium 5 mg 3-4 times daily -Continue PT -Follow up with hematology as planned for MGUS  Return to clinic in 3 months  Total time spent reviewing records, interview, history/exam, documentation, and coordination of care on day of encounter:  50 min  Jacquelyne Balint, MD

## 2023-08-13 ENCOUNTER — Ambulatory Visit: Payer: Medicare Other | Admitting: Physical Therapy

## 2023-08-13 ENCOUNTER — Encounter: Payer: Self-pay | Admitting: Physical Therapy

## 2023-08-13 DIAGNOSIS — R600 Localized edema: Secondary | ICD-10-CM

## 2023-08-13 DIAGNOSIS — M25675 Stiffness of left foot, not elsewhere classified: Secondary | ICD-10-CM | POA: Diagnosis not present

## 2023-08-13 DIAGNOSIS — R2689 Other abnormalities of gait and mobility: Secondary | ICD-10-CM

## 2023-08-13 DIAGNOSIS — M6281 Muscle weakness (generalized): Secondary | ICD-10-CM | POA: Diagnosis not present

## 2023-08-13 DIAGNOSIS — R252 Cramp and spasm: Secondary | ICD-10-CM

## 2023-08-13 DIAGNOSIS — M25672 Stiffness of left ankle, not elsewhere classified: Secondary | ICD-10-CM

## 2023-08-13 DIAGNOSIS — M25652 Stiffness of left hip, not elsewhere classified: Secondary | ICD-10-CM | POA: Diagnosis not present

## 2023-08-13 DIAGNOSIS — R2681 Unsteadiness on feet: Secondary | ICD-10-CM | POA: Diagnosis not present

## 2023-08-13 DIAGNOSIS — Z9181 History of falling: Secondary | ICD-10-CM

## 2023-08-13 DIAGNOSIS — M25662 Stiffness of left knee, not elsewhere classified: Secondary | ICD-10-CM | POA: Diagnosis not present

## 2023-08-13 NOTE — Therapy (Signed)
OUTPATIENT PHYSICAL THERAPY NEURO TREATMENT   Patient Name: Michael [name] MRN: [id number] DOB:[birth year], 67 y.o., male Today's Date: 08/13/2023   PCP: [name], MD REFERRING PROVIDER: Antony Madura, MD  END OF SESSION:  PT End of Session - 08/13/23 1418     Visit Number 8    Number of Visits 9   8 + eval   Date for PT Re-Evaluation 08/30/23   pushed out due to scheduling delay   Authorization Type [employer] MEDICARE    Progress Note Due on Visit 10    PT Start Time 1404    PT Stop Time 1446    PT Time Calculation (min) 42 min    Equipment Utilized During Treatment Gait belt    Activity Tolerance Patient tolerated treatment well    Behavior During Therapy WFL for tasks assessed/performed             Past Medical History:  Diagnosis Date   Hyperlipidemia    Hypertension    Lumbar disc disease    Prostate cancer (HCC)    Past Surgical History:  Procedure Laterality Date   LUMBAR DISC SURGERY  2014   LUMBAR DISC SURGERY     right ankle fracture  1978   external pinning   Patient Active Problem List   Diagnosis Date Noted   MGUS (monoclonal gammopathy of unknown significance) 06/10/2023   Macrocytosis without anemia 06/10/2023   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   DDD (degenerative disc disease), lumbar 12/21/2022   Prostate cancer (HCC); T1c; GG 1; initial diagnosis 2011 12/05/2022   Chronic hyperglycemia 11/16/2022   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   Contusion of ulnar nerve, initial encounter 11/09/2022   Dyslipidemia, goal LDL below 70 09/23/2022   Elevated PSA, between 10 and less than 20 ng/ml 09/17/2022   Accelerated hypertension 09/17/2022   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   Vitamin D deficiency 09/07/2021    ONSET DATE: symptoms began in 2020  REFERRING DIAG: R29.898 (ICD-10-CM) - Weakness of both lower extremities R19.8 (ICD-10-CM) - Abdominal weakness M62.838 (ICD-10-CM) - Muscle spasm R26.9 (ICD-10-CM) - Gait abnormality W19.XXXS  (ICD-10-CM) - Fall, sequela M54.17 (ICD-10-CM) - Lumbosacral radiculopathy at S1 R26.89 (ICD-10-CM) - Imbalance D47.2 (ICD-10-CM) - Monoclonal gammopathy 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,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  He reports low back stiffness, but denies current pain.  He presents today as ambulatory with use of SPC mod I. Pt accompanied by: self (he continues to drive himself)  PERTINENT HISTORY: degenerative disc disease (followed by [facility]) s/p L5-S1 surgery, HLD, HTN, prostate cancer  Patient's symptoms started in 2020 - right flank pain (reported initially as abdominal pain, but pt clarifies this is not a deep cramp/stomach pain, but a muscular soreness especially with movement), core weakness (not able to sit up well), sneezing and coughing was painful as well.  A year later he noted stiffness in his legs after sitting where he has to walk up his legs with his hands.  Now, he reports numbness and soreness in left hamstring area as well as swelling and muscle spasms (left worse).  In order to walk he has to weight shift and bring feet into narrowed BOS then start with small steps.  He loosens up with movement. He reports freezing  when startled. Ambulates w/ SPC.  Upper body remains WNL.  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  PAIN:  Are you having pain? No  PRECAUTIONS: Fall  RED FLAGS: None   WEIGHT BEARING RESTRICTIONS: No  FALLS: Has patient fallen in last 6 months? Yes. Number of falls 3 times since the beginning of 2024. He last fell about 1 week ago while trying to do lawn work. He was holding on the the mower for stability. He saw an insect that scared him, his left leg locked, then he fell.  LIVING ENVIRONMENT: Lives with: lives alone Lives in: House/apartment Stairs: No Has following equipment at home: Single point cane  PLOF: Independent with household mobility with device, Independent with community mobility with device, Requires assistive device for independence, Needs assistance with ADLs, and Needs assistance with homemaking  PATIENT GOALS: To start strength training.  OBJECTIVE:   DIAGNOSTIC FINDINGS:  EMG on 05/14/23 showed an active left S1 radiculopathy (similar to prior EMG)  COGNITION: Overall cognitive status: Within functional limits for tasks assessed and pt hyperverbal   SENSATION: Light touch: WFL  COORDINATION: LE RAMS:  slow and deliberate Bilateral Heel-to-shin:  WNL  EDEMA:  Notable left medial thigh swelling and enlarged veins, bilateral ankle edema (baseline per pt)  MUSCLE TONE: Difficult to assess due to pt guarding/stiffness - possible R clonus  POSTURE: No Significant postural limitations  LOWER EXTREMITY ROM:     Active  Right Eval Left Eval  Hip flexion Grossly WFL  Hip extension   Hip abduction   Hip adduction   Hip internal rotation   Hip external rotation   Knee flexion   Knee extension   Ankle dorsiflexion   Ankle plantarflexion    Ankle inversion    Ankle eversion     (Blank rows = not tested)  LOWER EXTREMITY MMT:    MMT Right Eval  Left Eval  Hip flexion 4-/5 3+/5  Hip extension    Hip abduction    Hip adduction    Hip internal rotation    Hip external rotation    Knee flexion    Knee extension 4/5 4/5  Ankle dorsiflexion 3/5 3/5  Ankle plantarflexion    Ankle inversion    Ankle eversion    (Blank rows = not tested)  BED MOBILITY:  Pt needs increased time to get  into standard bed and states his is too soft.  He sometimes sleeps on the couch because it is easier to move on a firmer surface.  TRANSFERS: Assistive device utilized: Single point cane  Sit to stand: Modified independence Stand to sit: Modified independence Chair to chair: SBA Floor:  pt states he can get himself up with something to push on  GAIT: Gait pattern: step through pattern, decreased step length- Right, decreased step length- Left, decreased stride length, decreased hip/knee flexion- Right, decreased hip/knee flexion- Left, and narrow BOS Distance walked: various clinic distances Assistive device utilized: Single point cane Level of assistance: SBA Comments: No notable ataxia or involuntary movements during ambulation into/out of clinic.  FUNCTIONAL TESTS:  5 times sit to stand: 26.72 sec w/ BUE support Timed up and go (TUG): To be assessed. 10 meter walk test: To be assessed.  PATIENT SURVEYS:  None appropriate for patient presentation.  TODAY'S TREATMENT:                                                                                                                              DATE: 08/13/2023 -SciFit in ramp mode up to level 7.0 over 8 minutes using BLE only for dynamic cardiovascular warmup and increased amplitude of movement. -Seated BLE A/P tilt board for ankle mobility x1 minute > L/M tilt board x1 minute -Seated BAPS board CW rotations x2 minutes > CCW rotations x2 minutes -Ankle pumps x20 bilaterally -Modified quadruped birddogs UE only x5 each side > LE only x5 each side > standing birddogs at counter x10 alternating  sides  PATIENT EDUCATION: Education details: Continue walking program and HEP w/ additions.  Discussed modifications to seated setup at home to elevated LE using kitchen chair so that he can better manage BLE edema when doing sedentary things like watching tv. Person educated: Patient Education method: Explanation Education comprehension: verbalized understanding and needs further education  HOME EXERCISE PROGRAM: You Can Walk For A Certain Length Of Time Each Day (can start with 3-4 days and work up to everyday - use cane for safety and walk in well lit familiar areas with smooth surfaces)                          Walk 5 minutes 2 times per day.             Increase 2  minutes every 7 days              Work up to 20 minutes (1x per day).               Example:                         Day 1-2           4-5 minutes     3 times per day  Day 7-8           10-12 minutes 2-3 times per day                         Day 13-14       20-22 minutes 1-2 times per day  Access Code: [email or web address] URL: [email or web address] Date: 08/06/2023 Prepared by: [name]  Exercises - Staggered Sit-to-Stand  - 1 x daily - 7 x weekly - 1 sets - 10 reps - Seated Hamstring Stretch  - 1 x daily - 4-5 x weekly - 1 sets - 2-3 reps - 1 minute hold - Gastroc Stretch on Wall  - 1 x daily - 4-5 x weekly - 1 sets - 3 reps - 45 seconds hold - Supine Figure 4 Piriformis Stretch  - 1 x daily - 4-5 x weekly - 1 sets - 2 reps - 1 minute hold - Supine Bridge  - 1 x daily - 7 x weekly - 1-2 sets - 10 reps - Supine Lower Trunk Rotation  - 1 x daily - 4 x weekly - 2 sets - 10 reps - Hooklying Single Knee to Chest Stretch  - 1 x daily - 5 x weekly - 1 sets - 2-3 reps - 45 seconds  to 1 minute hold - Sidelying Open Book Thoracic Lumbar Rotation and Extension  - 1 x daily - 4-5 x weekly - 2-3 sets - 10 reps - Modified Thomas Stretch  - 1 x daily - 7 x weekly - 1 sets - 2 reps - 1-2 minutes  hold -Ankle pumps (best if feet elevated) - Bird Dog on Counter  - 1 x daily - 4-5 x weekly - 2 sets - 10 reps  GOALS: Goals reviewed with patient? Yes  SHORT TERM GOALS: Target date: 07/26/2023  Pt will decrease 5xSTS to </=19.72 seconds in order to demonstrate decreased risk for falls and improved functional bilateral LE strength and power. Baseline:  26.72 w/ BUE support; 24.90 sec w/ BUE support and some propulsion into standing (10/1) Goal status: IN PROGRESS  2.  Pt will demonstrate TUG of </=20.97 seconds in order to decrease risk of falls and improve functional mobility using LRAD. Baseline: 25.97 sec w/ SPC (9/3); 22.39 sec average w/ SPC (10/1) Goal status: IN PROGRESS  3.  Pt will demonstrate a gait speed of >/=2.49 feet/sec in order to decrease risk for falls. Baseline: 2.29 ft/sec w/ SPC (9/3); 2.58 ft/sec w/ SPC (10/1) Goal status: MET  LONG TERM GOALS: Target date: 08/23/2023  Pt will be independent and compliant with strength, stretching, and balance focused HEP in order to maintain functional progress and improve mobility. Baseline:  To be established. Goal status: INITIAL  2.  Pt will decrease 5xSTS to </=12.72 seconds in order to demonstrate decreased risk for falls and improved functional bilateral LE strength and power. Baseline: 26.72 w/ BUE support Goal status: INITIAL  3.  Pt will demonstrate TUG of </=15.97 seconds in order to decrease risk of falls and improve functional mobility using LRAD. Baseline: 25.97 sec w/ SPC (9/3) Goal status: INITIAL  4.  Pt will demonstrate a gait speed of >/=2.69 feet/sec in order to decrease risk for falls. Baseline: 2.29 ft/sec w/ SPC (9/3) Goal status: INITIAL  5.  Pt will ambulate >/=800 feet with LRAD at modI level over unlevel outdoor surfaces and grass to promote household and community access. Baseline: To be assessed. Goal status: INITIAL  ASSESSMENT:  CLINICAL IMPRESSION: Ongoing additions to HEP to manage  edema limiting ankle mobility and standing balance and flexibility.  He has difficulty with quadruped strengthening today demonstrating significant left hip extensor weakness.  He continues to benefit from skilled PT to improve functional mobility and ROM.   OBJECTIVE IMPAIRMENTS: decreased activity tolerance, decreased balance, decreased coordination, decreased endurance, decreased knowledge of condition, decreased mobility, difficulty walking, decreased strength, increased edema, and increased muscle spasms.   ACTIVITY LIMITATIONS: carrying, lifting, bending, sitting, standing, squatting, stairs, transfers, bed mobility, and locomotion level  PARTICIPATION LIMITATIONS: shopping, community activity, and yard work  PERSONAL FACTORS: Age, Fitness, and 1-2 comorbidities: HTN, ongoing S1 radiculopathy  are also affecting patient's functional outcome.   REHAB POTENTIAL: Good  CLINICAL DECISION MAKING: Evolving/moderate complexity  EVALUATION COMPLEXITY: Moderate  PLAN:  PT FREQUENCY: 1x/week  PT DURATION: 8 weeks  PLANNED INTERVENTIONS: Therapeutic exercises, Therapeutic activity, Neuromuscular re-education, Balance training, Gait training, Patient/Family education, Self Care, Stair training, Vestibular training, Orthotic/Fit training, DME instructions, Manual therapy, and Re-evaluation  PLAN FOR NEXT SESSION: Add to HEP for core and LE strength, stretching, and balance.  SciFit for reciprocal mobility.  Tall/half kneel and quadruped activities.  Dynamic and static balance - on incline/compliant surfaces.  Core strengthening.  Blaze pods for reaction time in standing.  Planning to re-cert at end of POC per pt preference.  Pt likes bowling.  Sadie Haber, PT, DPT 08/13/2023, 2:47 PM

## 2023-08-13 NOTE — Patient Instructions (Signed)
Access Code: [email or web address] URL: [email or web address] Date: 08/06/2023 Prepared by: [name]  Exercises - Staggered Sit-to-Stand  - 1 x daily - 7 x weekly - 1 sets - 10 reps - Seated Hamstring Stretch  - 1 x daily - 4-5 x weekly - 1 sets - 2-3 reps - 1 minute hold - Gastroc Stretch on Wall  - 1 x daily - 4-5 x weekly - 1 sets - 3 reps - 45 seconds hold - Supine Figure 4 Piriformis Stretch  - 1 x daily - 4-5 x weekly - 1 sets - 2 reps - 1 minute hold - Supine Bridge  - 1 x daily - 7 x weekly - 1-2 sets - 10 reps - Supine Lower Trunk Rotation  - 1 x daily - 4 x weekly - 2 sets - 10 reps - Hooklying Single Knee to Chest Stretch  - 1 x daily - 5 x weekly - 1 sets - 2-3 reps - 45 seconds  to 1 minute hold - Sidelying Open Book Thoracic Lumbar Rotation and Extension  - 1 x daily - 4-5 x weekly - 2-3 sets - 10 reps - Modified Thomas Stretch  - 1 x daily - 7 x weekly - 1 sets - 2 reps - 1-2 minutes hold -Ankle pumps (best if feet elevated) - Bird Dog on Counter  - 1 x daily - 4-5 x weekly - 2 sets - 10 reps

## 2023-08-15 ENCOUNTER — Inpatient Hospital Stay (HOSPITAL_BASED_OUTPATIENT_CLINIC_OR_DEPARTMENT_OTHER): Payer: Medicare Other | Admitting: Hematology and Oncology

## 2023-08-15 ENCOUNTER — Telehealth: Payer: Self-pay

## 2023-08-15 ENCOUNTER — Encounter: Payer: Self-pay | Admitting: Hematology and Oncology

## 2023-08-15 VITALS — BP 159/73 | HR 52 | Temp 97.8°F | Resp 18 | Ht 75.0 in | Wt 253.8 lb

## 2023-08-15 DIAGNOSIS — D472 Monoclonal gammopathy: Secondary | ICD-10-CM | POA: Diagnosis not present

## 2023-08-15 DIAGNOSIS — R718 Other abnormality of red blood cells: Secondary | ICD-10-CM

## 2023-08-15 DIAGNOSIS — R600 Localized edema: Secondary | ICD-10-CM | POA: Diagnosis not present

## 2023-08-15 NOTE — Assessment & Plan Note (Signed)
I suspect he might have thalassemia Observe only

## 2023-08-15 NOTE — Progress Notes (Signed)
[facility] OFFICE PROGRESS NOTE  Patient Care Team: [name], MD as PCP - General (Internal Medicine) Antony Madura, MD as Consulting Physician (Neurology)  ASSESSMENT & PLAN:  MGUS (monoclonal gammopathy of unknown significance) We reviewed test results.  He has slight elevation of IgG level and kappa light chain level without signs of anemia, renal failure or hypercalcemia Previously, the myeloma panel that was ordered by his referring physician did not reviewed M protein level I plan to see him again in 3 months for further follow-up with plan to repeat all his blood work as well as 24-hour urine collection and skeletal survey  RBC microcytosis I suspect he might have thalassemia Observe only  Orders Placed This Encounter  Procedures   DG Bone Survey Met    Standing Status:   Future    Standing Expiration Date:   08/14/2024    Order Specific Question:   Reason for Exam (SYMPTOM  OR DIAGNOSIS REQUIRED)    Answer:   staging myeloma    Order Specific Question:   Preferred imaging location?    Answer:   [facility]   CBC with Differential (Cancer Center Only)    Standing Status:   Future    Standing Expiration Date:   08/14/2024   CMP (Cancer Center only)    Standing Status:   Future    Standing Expiration Date:   08/14/2024   Kappa/lambda light chains    Standing Status:   Standing    Number of Occurrences:   22    Standing Expiration Date:   08/14/2024   Multiple Myeloma Panel (SPEP&IFE w/QIG)    Standing Status:   Standing    Number of Occurrences:   22    Standing Expiration Date:   08/14/2024   UPEP/UIFE/Light Chains/TP, 24-Hr Ur    Standing Status:   Future    Standing Expiration Date:   08/14/2024   Lactate dehydrogenase    Standing Status:   Future    Standing Expiration Date:   08/14/2024    All questions were answered. The patient knows to call the clinic with any problems, questions or concerns. The total time spent in the  appointment was 20 minutes encounter with patients including review of chart and various tests results, discussions about plan of care and coordination of care plan   Artis Delay, MD 08/15/2023 10:49 AM  INTERVAL HISTORY: Please see below for problem oriented charting. he returns for surveillance follow-up and review of test results We discussed results of his myeloma panel The patient was placed on some unknown medication that causes rash He has mild intermittent leg edema  REVIEW OF SYSTEMS:   Constitutional: Denies fevers, chills or abnormal weight loss Eyes: Denies blurriness of vision Ears, nose, mouth, throat, and face: Denies mucositis or sore throat Respiratory: Denies cough, dyspnea or wheezes Lymphatics: Denies new lymphadenopathy or easy bruising Neurological:Denies numbness, tingling or new weaknesses Behavioral/Psych: Mood is stable, no new changes  All other systems were reviewed with the patient and are negative.  I have reviewed the past medical history, past surgical history, social history and family history with the patient and they are unchanged from previous note.  ALLERGIES:  has No Known Allergies.  MEDICATIONS:  Current Outpatient Medications  Medication Sig Dispense Refill   diazepam (VALIUM) 5 MG tablet Take 1 tablet (5 mg total) by mouth in the morning, at noon, in the evening, and at bedtime. 120 tablet 2   GARLIC  PO Take by mouth daily.     hydrALAZINE (APRESOLINE) 25 MG tablet Take 1 tablet (25 mg total) by mouth 3 (three) times daily. 270 tablet 0   triamterene-hydrochlorothiazide (DYAZIDE) 37.5-25 MG capsule Take 1 each (1 capsule total) by mouth daily. 90 capsule 0   No current facility-administered medications for this visit.    SUMMARY OF ONCOLOGIC HISTORY: Oncology History  Prostate cancer (HCC); T1c; GG 1; initial diagnosis 2011  12/05/2022 Initial Diagnosis   Prostate cancer (HCC); T1c; GG 1; initial diagnosis 2011   06/10/2023 Cancer  Staging   Staging form: Prostate, AJCC 8th Edition - Clinical stage from 06/10/2023: cT1, cN0, cM0 - Signed by Artis Delay, MD on 06/10/2023 Stage prefix: Initial diagnosis     PHYSICAL EXAMINATION: ECOG PERFORMANCE STATUS: 1 - Symptomatic but completely ambulatory  Vitals:   08/15/23 1012  BP: (!) 159/73  Pulse: (!) 52  Resp: 18  Temp: 97.8 F (36.6 C)  SpO2: 98%   Filed Weights   08/15/23 1012  Weight: 253 lb 12.8 oz (115.1 kg)    GENERAL:alert, no distress and comfortable  LABORATORY DATA:  I have reviewed the data as listed    Component Value Date/Time   NA 139 08/05/2023 1609   NA 146 (H) 01/05/2022 1523   K 3.6 08/05/2023 1609   CL 105 08/05/2023 1609   CO2 28 08/05/2023 1609   GLUCOSE 83 08/05/2023 1609   BUN 15 08/05/2023 1609   BUN 16 01/05/2022 1523   CREATININE 0.84 08/05/2023 1609   CALCIUM 9.5 08/05/2023 1609   PROT 8.2 (H) 08/05/2023 1609   ALBUMIN 4.3 08/05/2023 1609   AST 19 08/05/2023 1609   ALT 13 08/05/2023 1609   ALKPHOS 87 08/05/2023 1609   BILITOT 0.9 08/05/2023 1609   GFRNONAA >60 08/05/2023 1609   GFRAA  07/17/2010 1000    >60        The eGFR has been calculated using the MDRD equation. This calculation has not been validated in all clinical situations. eGFR's persistently <60 mL/min signify possible Chronic Kidney Disease.    No results found for: "SPEP", "UPEP"  Lab Results  Component Value Date   WBC 5.7 08/05/2023   NEUTROABS 2.5 08/05/2023   HGB 15.3 08/05/2023   HCT 45.0 08/05/2023   MCV 66.8 (L) 08/05/2023   PLT 185 08/05/2023      Chemistry      Component Value Date/Time   NA 139 08/05/2023 1609   NA 146 (H) 01/05/2022 1523   K 3.6 08/05/2023 1609   CL 105 08/05/2023 1609   CO2 28 08/05/2023 1609   BUN 15 08/05/2023 1609   BUN 16 01/05/2022 1523   CREATININE 0.84 08/05/2023 1609      Component Value Date/Time   CALCIUM 9.5 08/05/2023 1609   ALKPHOS 87 08/05/2023 1609   AST 19 08/05/2023 1609   ALT 13  08/05/2023 1609   BILITOT 0.9 08/05/2023 1609

## 2023-08-15 NOTE — Assessment & Plan Note (Signed)
We reviewed test results.  He has slight elevation of IgG level and kappa light chain level without signs of anemia, renal failure or hypercalcemia Previously, the myeloma panel that was ordered by his referring physician did not reviewed M protein level I plan to see him again in 3 months for further follow-up with plan to repeat all his blood work as well as 24-hour urine collection and skeletal survey

## 2023-08-15 NOTE — Telephone Encounter (Signed)
Called and left a message skeletal bone survey is scheduled on 11/06/23 at 9 am at [facility], arrive at 0845 after 8 am lab appt.

## 2023-08-15 NOTE — Assessment & Plan Note (Deleted)
We reviewed test results.  He has slight elevation of IgG level and kappa light chain level without signs of anemia, renal failure or hypercalcemia Previously, the myeloma panel that was ordered by his referring physician did not reviewed M protein level I plan to see him again in 3 months for further follow-up with plan to repeat all his blood work as well as 24-hour urine collection and skeletal survey

## 2023-08-19 ENCOUNTER — Encounter: Payer: Self-pay | Admitting: Neurology

## 2023-08-20 ENCOUNTER — Encounter: Payer: Self-pay | Admitting: Physical Therapy

## 2023-08-20 ENCOUNTER — Ambulatory Visit: Payer: Medicare Other | Admitting: Physical Therapy

## 2023-08-20 DIAGNOSIS — M25662 Stiffness of left knee, not elsewhere classified: Secondary | ICD-10-CM | POA: Diagnosis not present

## 2023-08-20 DIAGNOSIS — R2689 Other abnormalities of gait and mobility: Secondary | ICD-10-CM

## 2023-08-20 DIAGNOSIS — M25652 Stiffness of left hip, not elsewhere classified: Secondary | ICD-10-CM

## 2023-08-20 DIAGNOSIS — M6281 Muscle weakness (generalized): Secondary | ICD-10-CM | POA: Diagnosis not present

## 2023-08-20 DIAGNOSIS — M25675 Stiffness of left foot, not elsewhere classified: Secondary | ICD-10-CM

## 2023-08-20 DIAGNOSIS — R600 Localized edema: Secondary | ICD-10-CM

## 2023-08-20 DIAGNOSIS — R252 Cramp and spasm: Secondary | ICD-10-CM | POA: Diagnosis not present

## 2023-08-20 DIAGNOSIS — R2681 Unsteadiness on feet: Secondary | ICD-10-CM

## 2023-08-20 DIAGNOSIS — Z9181 History of falling: Secondary | ICD-10-CM

## 2023-08-20 DIAGNOSIS — M25672 Stiffness of left ankle, not elsewhere classified: Secondary | ICD-10-CM

## 2023-08-20 NOTE — Therapy (Signed)
OUTPATIENT PHYSICAL THERAPY NEURO TREATMENT - Re-cert/ 13YQ VISIT PN!   Patient Name: Michael [name] MRN: [id number] DOB:July 22, 1956, 67 y.o., male Today's Date: 08/20/2023   PCP: [name], MD REFERRING PROVIDER: Antony Madura, MD  PT progress note for Michael [name].  Reporting period 06/25/2023 to 08/20/2023.  See Note below for Objective Data and Assessment of Progress/Goals  Thank you for the referral of this patient. [name], PT, DPT  END OF SESSION:  PT End of Session - 08/20/23 1549     Visit Number 9    Number of Visits 9   8 + eval   Date for PT Re-Evaluation 10/25/23   pushed out due to scheduling delay at re-cert   Authorization Type [employer] MEDICARE    Progress Note Due on Visit 10    PT Start Time 1538   pt arrived to session late   PT Stop Time 1616    PT Time Calculation (min) 38 min    Equipment Utilized During Treatment Gait belt    Activity Tolerance Patient tolerated treatment well    Behavior During Therapy WFL for tasks assessed/performed             Past Medical History:  Diagnosis Date   Hyperlipidemia    Hypertension    Lumbar disc disease    Prostate cancer (HCC)    Past Surgical History:  Procedure Laterality Date   LUMBAR DISC SURGERY  2014   LUMBAR DISC SURGERY     right ankle fracture  1978   external pinning   Patient Active Problem List   Diagnosis Date Noted   MGUS (monoclonal gammopathy of unknown significance) 06/10/2023   RBC microcytosis 06/10/2023   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   DDD (degenerative disc disease), lumbar 12/21/2022   Prostate cancer (HCC); T1c; GG 1; initial diagnosis 2011 12/05/2022   Chronic hyperglycemia 11/16/2022   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   Contusion of ulnar nerve, initial encounter 11/09/2022   Dyslipidemia, goal LDL below 70 09/23/2022   Elevated PSA, between 10 and less than 20 ng/ml 09/17/2022   Accelerated hypertension 09/17/2022   Ventricular bigeminy  12/05/2021   Vitamin D deficiency 09/07/2021    ONSET DATE: symptoms began in 2020  REFERRING DIAG: R29.898 (ICD-10-CM) - Weakness of both lower extremities R19.8 (ICD-10-CM) - Abdominal weakness M62.838 (ICD-10-CM) - Muscle spasm R26.9 (ICD-10-CM) - Gait abnormality W19.XXXS (ICD-10-CM) - Fall, sequela M54.17 (ICD-10-CM) - Lumbosacral radiculopathy at S1 R26.89 (ICD-10-CM) - Imbalance D47.2 (ICD-10-CM) - Monoclonal gammopathy 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,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  He reports he is doing well and denies current pain.  He presents today as ambulatory with use of SPC mod I. Pt accompanied by: self (& mother - in lobby)  PERTINENT HISTORY: degenerative disc disease (followed by [facility]) s/p L5-S1 surgery, HLD, HTN, prostate cancer  Patient's symptoms started in 2020 - right flank pain (reported initially as abdominal pain, but pt clarifies this is not a deep cramp/stomach pain, but a muscular soreness especially with movement), core weakness (not able to sit up well), sneezing and coughing was painful as well.  A year later he noted stiffness in his legs after sitting where he has to walk up his legs with his hands.   Now, he reports numbness and soreness in left hamstring area as well as swelling and muscle spasms (left worse).  In order to walk he has to weight shift and bring feet into narrowed BOS then start with small steps.  He loosens up with movement. He reports freezing when startled. Ambulates w/ SPC.  Upper body remains WNL.  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  PAIN:  Are you having pain? No  PRECAUTIONS: Fall  RED FLAGS: None   WEIGHT BEARING RESTRICTIONS: No  FALLS: Has patient fallen in last 6 months? Yes. Number of falls 3 times since the beginning of 2024. He last fell about 1 week ago while trying to do lawn work. He was holding on the the mower for stability. He saw an insect that scared him, his left leg locked, then he fell.  LIVING ENVIRONMENT: Lives with: lives alone Lives in: House/apartment Stairs: No Has following equipment at home: Single point cane  PLOF: Independent with household mobility with device, Independent with community mobility with device, Requires assistive device for independence, Needs assistance with ADLs, and Needs assistance with homemaking  PATIENT GOALS: To start strength training.  OBJECTIVE:   DIAGNOSTIC FINDINGS:  EMG on 05/14/23 showed an active left S1 radiculopathy (similar to prior EMG)  COGNITION: Overall cognitive status: Within functional limits for tasks assessed and pt hyperverbal   SENSATION: Light touch: WFL  COORDINATION: LE RAMS:  slow and deliberate Bilateral Heel-to-shin:  WNL  EDEMA:  Notable left medial thigh swelling and enlarged veins, bilateral ankle edema (baseline per pt)  MUSCLE TONE: Difficult to assess due to pt guarding/stiffness - possible R clonus  POSTURE: No Significant postural limitations  LOWER EXTREMITY ROM:     Active  Right Eval Left Eval  Hip flexion Grossly WFL  Hip extension   Hip  abduction   Hip adduction   Hip internal rotation   Hip external rotation   Knee flexion   Knee extension   Ankle dorsiflexion   Ankle plantarflexion    Ankle inversion    Ankle eversion     (Blank rows = not tested)  LOWER EXTREMITY MMT:    MMT Right Eval Left Eval  Hip flexion 4-/5 3+/5  Hip extension    Hip abduction    Hip adduction    Hip internal rotation    Hip external rotation    Knee flexion    Knee extension 4/5 4/5  Ankle dorsiflexion 3/5 3/5  Ankle plantarflexion    Ankle inversion    Ankle eversion    (Blank rows = not tested)  BED MOBILITY:  Pt needs increased time to  get into standard bed and states his is too soft.  He sometimes sleeps on the couch because it is easier to move on a firmer surface.  TRANSFERS: Assistive device utilized: Single point cane  Sit to stand: Modified independence Stand to sit: Modified independence Chair to chair: SBA Floor:  pt states he can get himself up with something to push on  GAIT: Gait pattern: step through pattern, decreased step length- Right, decreased step length- Left, decreased stride length, decreased hip/knee flexion- Right, decreased hip/knee flexion- Left, and narrow BOS Distance walked: various clinic distances Assistive device utilized: Single point cane Level of assistance: SBA Comments: No notable ataxia or involuntary movements during ambulation into/out of clinic.  FUNCTIONAL TESTS:  5 times sit to stand: 26.72 sec w/ BUE support Timed up and go (TUG): To be assessed. 10 meter walk test: To be assessed.  PATIENT SURVEYS:  None appropriate for patient presentation.  TODAY'S TREATMENT:                                                                                                                              DATE: 08/20/2023 -SciFit multi-peaks up to level 7.0 over 8 minutes using BLE only for dynamic cardiovascular warmup and increased amplitude of movement per pt preference.  -Verbally  reviewed HEP, pt has current copy. -TUG:  24.94 seconds w/ SPC -5xSTS:  19.34 seconds w/ BUE support - :  12.13 seconds = 0.82 m/sec OR 2.72 ft/sec w/ SPC  -Brief discussion of patient startle response in standing and when being approached from behind.  He states this is improving when driving especially and he feels his core strength has helped him better maintain his upright balance.  Discussed further addressing this in future sessions to round out balance strategies and reaction time as previously addressed.  PATIENT EDUCATION: Education details: Discussed adding aquatic therapy to POC, pt not wanting to see additional provider due to this provider's limited pool schedule availability so will hold at this time and continue land PT only.  Continue walking program and HEP.   Person educated: Patient Education method: Explanation Education comprehension: verbalized understanding and needs further education  HOME EXERCISE PROGRAM: You Can Walk For A Certain Length Of Time Each Day (can start with 3-4 days and work up to everyday - use cane for safety and walk in well lit familiar areas with smooth surfaces)                          Walk 5 minutes 2 times per day.             Increase 2  minutes every 7 days              Work up to 20 minutes (1x per day).               Example:  Day 1-2           4-5 minutes     3 times per day                         Day 7-8           10-12 minutes 2-3 times per day                         Day 13-14       20-22 minutes 1-2 times per day  Access Code: [email or web address] URL: [email or web address] Date: 08/06/2023 Prepared by: [name]  Exercises - Staggered Sit-to-Stand  - 1 x daily - 7 x weekly - 1 sets - 10 reps - Seated Hamstring Stretch  - 1 x daily - 4-5 x weekly - 1 sets - 2-3 reps - 1 minute hold - Gastroc Stretch on Wall  - 1 x daily - 4-5 x weekly - 1 sets - 3 reps - 45 seconds hold - Supine Figure 4  Piriformis Stretch  - 1 x daily - 4-5 x weekly - 1 sets - 2 reps - 1 minute hold - Supine Bridge  - 1 x daily - 7 x weekly - 1-2 sets - 10 reps - Supine Lower Trunk Rotation  - 1 x daily - 4 x weekly - 2 sets - 10 reps - Hooklying Single Knee to Chest Stretch  - 1 x daily - 5 x weekly - 1 sets - 2-3 reps - 45 seconds  to 1 minute hold - Sidelying Open Book Thoracic Lumbar Rotation and Extension  - 1 x daily - 4-5 x weekly - 2-3 sets - 10 reps - Modified Thomas Stretch  - 1 x daily - 7 x weekly - 1 sets - 2 reps - 1-2 minutes hold -Ankle pumps (best if feet elevated) - Bird Dog on Counter  - 1 x daily - 4-5 x weekly - 2 sets - 10 reps  GOALS: Goals reviewed with patient? Yes  SHORT TERM GOALS: Target date: 07/26/2023  Pt will decrease 5xSTS to </=19.72 seconds in order to demonstrate decreased risk for falls and improved functional bilateral LE strength and power. Baseline:  26.72 w/ BUE support; 24.90 sec w/ BUE support and some propulsion into standing (10/1) Goal status: IN PROGRESS  2.  Pt will demonstrate TUG of </=20.97 seconds in order to decrease risk of falls and improve functional mobility using LRAD. Baseline: 25.97 sec w/ SPC (9/3); 22.39 sec average w/ SPC (10/1) Goal status: IN PROGRESS  3.  Pt will demonstrate a gait speed of >/=2.49 feet/sec in order to decrease risk for falls. Baseline: 2.29 ft/sec w/ SPC (9/3); 2.58 ft/sec w/ SPC (10/1) Goal status: MET  LONG TERM GOALS: Target date: 08/23/2023  Pt will be independent and compliant with strength, stretching, and balance focused HEP in order to maintain functional progress and improve mobility. Baseline:  Pt compliant and IND per report (10/22) Goal status: MET  2.  Pt will decrease 5xSTS to </=12.72 seconds in order to demonstrate decreased risk for falls and improved functional bilateral LE strength and power. Baseline: 26.72 w/ BUE support; 19.34 seconds w/ BUE support (10/22) Goal status: PARTIALLY MET  3.  Pt  will demonstrate TUG of </=15.97 seconds in order to decrease risk of falls and improve functional mobility using LRAD. Baseline: 25.97 sec w/ SPC (9/3); 24.94 seconds w/ SPC (10/22) Goal status: NOT  MET  4.  Pt will demonstrate a gait speed of >/=2.69 feet/sec in order to decrease risk for falls. Baseline: 2.29 ft/sec w/ SPC (9/3); 2.72 ft/sec w/ SPC (10/22) Goal status:  MET  5.  Pt will ambulate >/=800 feet with LRAD at modI level over unlevel outdoor surfaces and grass to promote household and community access. Baseline: To be assessed. Goal status: INITIAL  GOALS (at re-cert): Goals reviewed with patient? Yes  SHORT TERM GOALS: Target date: 09/20/2023  Pt will decrease 5xSTS to </=15.34 seconds in order to demonstrate decreased risk for falls and improved functional bilateral LE strength and power. Baseline:  19.34 seconds w/ BUE support (10/22) Goal status: ONGOING  2.  Pt will demonstrate TUG of </=20.94 seconds in order to decrease risk of falls and improve functional mobility using LRAD. Baseline: 24.94 seconds w/ SPC (10/22) Goal status: REVISED  3.  Pt will demonstrate a gait speed of >/=2.92 feet/sec in order to decrease risk for falls. Baseline: 2.72 ft/sec w/ SPC (10/22) Goal status: REVISED  LONG TERM GOALS: Target date: 10/18/2023  Pt will be independent and compliant with finalized strength, stretching, and balance focused HEP in order to maintain functional progress and improve mobility. Baseline:  Needs review and updates (10/22) Goal status:  REVISED  2.  Pt will decrease 5xSTS to </=11.34 seconds in order to demonstrate decreased risk for falls and improved functional bilateral LE strength and power. Baseline: 19.34 seconds w/ BUE support (10/22) Goal status: ONGOING  3.  Pt will demonstrate TUG of </=16.94 seconds in order to decrease risk of falls and improve functional mobility using LRAD. Baseline: 24.94 seconds w/ SPC (10/22) Goal status:  REVISED  4.  Pt will demonstrate a gait speed of >/=3.12 feet/sec in order to decrease risk for falls. Baseline: 2.72 ft/sec w/ SPC (10/22) Goal status:  REVISED  5.  Pt will ambulate >/=800 feet with LRAD at modI level over unlevel outdoor surfaces and grass to promote household and community access. Baseline: To be assessed. Goal status: INITIAL  ASSESSMENT:  CLINICAL IMPRESSION: Assessed 4 of 5 LTGs this session in preparation for re-cert today.  He demonstrates moderate progress towards all assessed, meeting two of four as written.  His TUG did not appear significantly improved, but his 5xSTS was at 19.34 seconds w/ BUE support.  Last goal will be updated at next session with plan adjusted to meet patient's current functional level.  Did discuss aquatic therapy, but patient did not want to see additional therapist and current therapist lacks immediate availability for aquatics.  Will adjust plan as needed in future.  OBJECTIVE IMPAIRMENTS: decreased activity tolerance, decreased balance, decreased coordination, decreased endurance, decreased knowledge of condition, decreased mobility, difficulty walking, decreased strength, increased edema, and increased muscle spasms.   ACTIVITY LIMITATIONS: carrying, lifting, bending, sitting, standing, squatting, stairs, transfers, bed mobility, and locomotion level  PARTICIPATION LIMITATIONS: shopping, community activity, and yard work  PERSONAL FACTORS: Age, Fitness, and 1-2 comorbidities: HTN, ongoing S1 radiculopathy  are also affecting patient's functional outcome.   REHAB POTENTIAL: Good  CLINICAL DECISION MAKING: Evolving/moderate complexity  EVALUATION COMPLEXITY: Moderate  PLAN:  PT FREQUENCY: 1x/week (frequency maintained at re-cert 65/78)  PT DURATION: 8 weeks  PLANNED INTERVENTIONS: 46962- PT Re-evaluation, 97110-Therapeutic exercises, 97530- Therapeutic activity, 97112- Neuromuscular re-education, 97535- Self Care, [id number]- Manual  therapy, [phone]- Gait training, [phone]- Orthotic Fit/training, [phone]- Aquatic Therapy, Patient/Family education, Balance training, Stair training, Vestibular training, DME instructions, Therapeutic exercises, Therapeutic activity, Neuromuscular re-education, Gait training, and  Self Care  PLAN FOR NEXT SESSION: Add to HEP for core and LE strength, stretching, and balance.  SciFit for reciprocal mobility.  Tall/half kneel and quadruped activities.  Dynamic and static balance - on incline/compliant surfaces.  Core strengthening.  Blaze pods for reaction time in standing.  Pt likes bowling.  Head turning/trunk rotation for startle response during mobility and upright tasks.  DELETE OLD GOALS.  Assess remaining LTG 5 and update baseline.  Sadie Haber, PT, DPT 08/20/2023, 4:14 PM

## 2023-08-21 ENCOUNTER — Ambulatory Visit: Payer: Medicare Other | Admitting: Neurology

## 2023-08-21 ENCOUNTER — Encounter: Payer: Self-pay | Admitting: Neurology

## 2023-08-21 VITALS — BP 150/86 | HR 52 | Ht 75.0 in | Wt 253.0 lb

## 2023-08-21 DIAGNOSIS — M62838 Other muscle spasm: Secondary | ICD-10-CM

## 2023-08-21 DIAGNOSIS — R760 Raised antibody titer: Secondary | ICD-10-CM | POA: Diagnosis not present

## 2023-08-21 DIAGNOSIS — R29898 Other symptoms and signs involving the musculoskeletal system: Secondary | ICD-10-CM | POA: Diagnosis not present

## 2023-08-21 DIAGNOSIS — R198 Other specified symptoms and signs involving the digestive system and abdomen: Secondary | ICD-10-CM

## 2023-08-21 DIAGNOSIS — G629 Polyneuropathy, unspecified: Secondary | ICD-10-CM

## 2023-08-21 DIAGNOSIS — R269 Unspecified abnormalities of gait and mobility: Secondary | ICD-10-CM

## 2023-08-21 DIAGNOSIS — D472 Monoclonal gammopathy: Secondary | ICD-10-CM

## 2023-08-21 DIAGNOSIS — G2582 Stiff-man syndrome: Secondary | ICD-10-CM | POA: Diagnosis not present

## 2023-08-21 DIAGNOSIS — R2689 Other abnormalities of gait and mobility: Secondary | ICD-10-CM

## 2023-08-21 DIAGNOSIS — W19XXXS Unspecified fall, sequela: Secondary | ICD-10-CM

## 2023-08-21 DIAGNOSIS — M5417 Radiculopathy, lumbosacral region: Secondary | ICD-10-CM

## 2023-08-21 NOTE — Patient Instructions (Signed)
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 I am giving you more information on this today.  We will continue valium 5 mg 3-4 times per day.  I also will start an infusion called IVIg. You prefer to get this infusion at home, so I will put this order in for you to get approved. You will get 5 days of IVIg the first month, then just 1 day a month for another 5 months for a total of 6 months. We may or may not do more IVIg after this.  Continue physical therapy.  Follow up with hematology as planned.  I will see you back in clinic in 3 months or sooner if needed.  The physicians and staff at [facility] are committed to providing excellent care. You may receive a survey requesting feedback about your experience at our office. We strive to receive "very good" responses to the survey questions. If you feel that your experience would prevent you from giving the office a "very good " response, please contact our office to try to remedy the situation. We may be reached at [phone]. Thank you for taking the time out of your busy day to complete the survey.  Jacquelyne Balint, MD [facility]

## 2023-08-26 ENCOUNTER — Ambulatory Visit (INDEPENDENT_AMBULATORY_CARE_PROVIDER_SITE_OTHER): Payer: Medicare Other | Admitting: Family Medicine

## 2023-08-26 ENCOUNTER — Ambulatory Visit (INDEPENDENT_AMBULATORY_CARE_PROVIDER_SITE_OTHER): Payer: Medicare Other

## 2023-08-26 VITALS — BP 148/86 | HR 67 | Temp 98.3°F | Resp 20 | Ht 75.0 in | Wt 250.0 lb

## 2023-08-26 DIAGNOSIS — Z9181 History of falling: Secondary | ICD-10-CM | POA: Diagnosis not present

## 2023-08-26 DIAGNOSIS — S2241XA Multiple fractures of ribs, right side, initial encounter for closed fracture: Secondary | ICD-10-CM

## 2023-08-26 DIAGNOSIS — R0781 Pleurodynia: Secondary | ICD-10-CM

## 2023-08-26 DIAGNOSIS — R0789 Other chest pain: Secondary | ICD-10-CM

## 2023-08-26 DIAGNOSIS — R918 Other nonspecific abnormal finding of lung field: Secondary | ICD-10-CM | POA: Diagnosis not present

## 2023-08-26 MED ORDER — HYDROCODONE-ACETAMINOPHEN 5-325 MG PO TABS: 1.0000 | ORAL_TABLET | Freq: Three times a day (TID) | ORAL | 0 refills | Status: AC | PRN

## 2023-08-26 NOTE — Progress Notes (Signed)
Assessment & Plan:  1-2. Rib pain on right side/History of recent fall - DG Ribs Unilateral W/Chest Right; Future - HYDROcodone-acetaminophen (NORCO) 5-325 MG tablet; Take 1 tablet by mouth every 8 (eight) hours as needed for up to 5 days for moderate pain (pain score 4-6).  Dispense: 15 tablet; Refill: 0   Follow up plan: Return if symptoms worsen or fail to improve.  Deliah Boston, MSN, APRN, FNP-C  Subjective:  HPI: Michael [name] is a 67 y.o. 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  He reports it is hard to take a deep breath or cough due to the pain in his ribs.  He took ibuprofen x 24 hours which was helpful.  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    ROS: Negative unless specifically indicated above in HPI.   Relevant past medical history reviewed and updated as indicated.   Allergies and medications reviewed and updated.   Current Outpatient Medications:    diazepam (VALIUM) 5 MG tablet, Take 1 tablet (5 mg total) by mouth in the morning, at noon, in the evening, and at bedtime., Disp: 120 tablet, Rfl: 2   GARLIC PO, Take by mouth daily., Disp: , Rfl:    hydrALAZINE (APRESOLINE) 25 MG tablet, Take 1 tablet (25 mg total) by mouth 3 (three) times daily., Disp: 270 tablet, Rfl: 0   triamterene-hydrochlorothiazide (DYAZIDE) 37.5-25 MG capsule, Take 1 each (1 capsule total) by mouth daily., Disp: 90 capsule, Rfl: 0  No Known Allergies  Objective:   BP (!) 148/86   Pulse 67   Temp 98.3 F (36.8 C)   Resp 20   Ht 6\' 3"  (1.905 m)   Wt 250 lb (113.4 kg)   SpO2 95% Comment: RA  BMI 31.25 kg/m    Physical Exam Vitals reviewed.  Constitutional:      General: He is not in acute distress.    Appearance: Normal appearance. He is  not ill-appearing, toxic-appearing or diaphoretic.  HENT:     Head: Normocephalic and atraumatic.  Eyes:     General: No scleral icterus.       Right eye: No discharge.        Left eye: No discharge.     Conjunctiva/sclera: Conjunctivae normal.  Cardiovascular:     Rate and Rhythm: Normal rate.  Pulmonary:     Effort: Pulmonary effort is normal. No respiratory distress.  Chest:     Chest wall: Tenderness (right side) present.  Musculoskeletal:     Right shoulder: No swelling, deformity, effusion, laceration, tenderness or bony tenderness. Decreased range of motion (shoulder movement causes pain in ribs).     Cervical back: Normal range of motion.     Thoracic back: Tenderness present. No bony tenderness.  Skin:    General: Skin is warm and dry.  Neurological:     Mental Status: He is alert and oriented to person, place, and time. Mental status is at baseline.  Psychiatric:        Mood and Affect: Mood normal.        Behavior: Behavior normal.        Thought Content: Thought content normal.        Judgment: Judgment normal.

## 2023-08-27 ENCOUNTER — Ambulatory Visit: Payer: Medicare Other | Admitting: Physical Therapy

## 2023-08-28 ENCOUNTER — Ambulatory Visit (INDEPENDENT_AMBULATORY_CARE_PROVIDER_SITE_OTHER): Payer: Medicare Other | Admitting: Physician Assistant

## 2023-08-28 ENCOUNTER — Encounter: Payer: Self-pay | Admitting: Physician Assistant

## 2023-08-28 DIAGNOSIS — S2241XA Multiple fractures of ribs, right side, initial encounter for closed fracture: Secondary | ICD-10-CM

## 2023-08-28 DIAGNOSIS — S2249XA Multiple fractures of ribs, unspecified side, initial encounter for closed fracture: Secondary | ICD-10-CM | POA: Insufficient documentation

## 2023-08-28 NOTE — Progress Notes (Signed)
Office Visit Note   Patient: Michael [name]           Date of Birth: [birth year]           MRN: [id number] Visit Date: 08/28/2023              Requested by: Michael Fudge, FNP [address] [place],  [place] [address] PCP: Michael Grandchild, MD   Assessment & Plan: Visit Diagnoses:  1. Closed fracture of multiple ribs of right side, initial encounter     Plan: Pleasant 67 year old gentleman with history of stiff person syndrome comes in today with a 5-day history of right posterior rib pain.  He said he fell while mowing his lawn onto his right side.  Did not lose consciousness his family was nearby.  Was seen by his primary care who did x-rays that demonstrated multiple rib fractures.  No evidence of pneumothorax some blunting of the angle on the right side with questionable small pleural effusion.  He is not short of breath today very verbal in conversation.  He still has quite a bit of pain of course.  He does have pain medicine.  Reviewed his case with Michael [name].  He should continue to take his pain medication as needed.  Have encouraged the importance of deep breathing and coughing.  He understands the biggest risk is for pneumonia less likely pneumothorax at this point.  If he has any shortness of breath fever chills or sharp pain in his chest he is to go to the nearest emergency room.  Otherwise he will come in next week with repeat x-rays of his ribs on the right  Follow-Up Instructions: No follow-ups on file.   Orders:  No orders of the defined types were placed in this encounter.  No orders of the defined types were placed in this encounter.     Procedures: No procedures performed   Clinical Data: No additional findings.   Subjective: No chief complaint on file.   HPI patient is a pleasant 67 year old gentleman with a 5-day history of right rib pain.  He has a history of stiff person syndrome and prostate cancer.  This was a mechanical fall secondary to loss of  balance.  Has been seen by primary care and referred for pain for his right ribs x-rays done demonstrate recently a few days ago.  X-rays demonstrate displaced fifth sixth and posterior right rib fractures nondisplaced seventh rib fracture  Review of Systems  All other systems reviewed and are negative.    Objective: Vital Signs: There were no vitals taken for this visit.  Physical Exam Constitutional:      Appearance: Normal appearance.  Pulmonary:     Effort: Pulmonary effort is normal.  Neurological:     Mental Status: He is alert.  Psychiatric:        Mood and Affect: Mood normal.        Behavior: Behavior normal.    Ortho Exam Examination he ambulates slowly secondary to his SPS he is in no acute respiratory distress.  He is very verbal without being short of breath.  He does have tenderness over the posterior ribs but no ecchymosis no crepitus.  Good excursion. Specialty Comments:  No specialty comments available.  Imaging: No results found.   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   Contusion of ulnar nerve, initial encounter 11/09/2022   Dyslipidemia, goal LDL below 70 09/23/2022   Elevated PSA, between 10 and less than 20 ng/ml 09/17/2022   Accelerated hypertension 09/17/2022   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   Vitamin D deficiency 09/07/2021   Past Medical History:  Diagnosis Date   Hyperlipidemia    Hypertension    Lumbar disc disease    Prostate cancer (HCC)     Family History  Problem Relation Age of Onset   High blood pressure Mother     Past Surgical  History:  Procedure Laterality Date   LUMBAR DISC SURGERY  2014   LUMBAR DISC SURGERY     right ankle fracture  1978   external pinning   Social History   Occupational History   Not on file  Tobacco Use   Smoking status: Never    Passive exposure: Never   Smokeless tobacco: Never  Vaping Use   Vaping status: Never Used  Substance and Sexual Activity   Alcohol use: Not Currently    Comment: rare   Drug use: No   Sexual activity: Yes

## 2023-09-04 ENCOUNTER — Ambulatory Visit (INDEPENDENT_AMBULATORY_CARE_PROVIDER_SITE_OTHER): Payer: Medicare Other | Admitting: Physician Assistant

## 2023-09-04 ENCOUNTER — Other Ambulatory Visit (INDEPENDENT_AMBULATORY_CARE_PROVIDER_SITE_OTHER): Payer: Medicare Other

## 2023-09-04 ENCOUNTER — Encounter: Payer: Self-pay | Admitting: Physician Assistant

## 2023-09-04 ENCOUNTER — Ambulatory Visit: Payer: Medicare Other | Admitting: Physical Therapy

## 2023-09-04 DIAGNOSIS — S2241XA Multiple fractures of ribs, right side, initial encounter for closed fracture: Secondary | ICD-10-CM

## 2023-09-04 NOTE — Progress Notes (Signed)
Office Visit Note   Patient: Michael [name]           Date of Birth: [birth year]           MRN: [id number] Visit Date: 09/04/2023              Requested by: [name], MD [address] [place],  [place] [address] PCP: [name], MD  No chief complaint on file.     HPI: Is a pleasant 67 year old gentleman with a history of SPS who is now 2 weeks status post fall.  He sustained multiple rib fractures.  Comes in for recheck today.  He actually says he is feeling better denies any chest pain or shortness of breath only has reproducible pain when stretching his arm above his head and with coughing.  Denies any productive cough.  Assessment & Plan: Visit Diagnoses: Multiple rib fractures  Plan: I had a long discussion with the patient today.  I think he will continue to do well again he has been given precautions with regards to shortness of breath chest pain at rest or any productive cough.  He would like to start back with his physical therapy for SPS I told him that would be fine just to avoid anything that might aggravate his pain.  Will follow-up in 4 weeks but may cancel this if he is doing well  Follow-Up Instructions: 4 weeks  Ortho Exam  Patient is alert, oriented, no adenopathy, well-dressed, normal affect, normal respiratory effort. Examination he is comfortable appropriate no shortness of breath has good excursion of the diaphragm.  Still tender over his ribs but not nearly as much as a week ago  Imaging: No results found. No images are attached to the encounter.  Labs: Lab Results  Component Value Date   HGBA1C 5.9 11/22/2022   ESRSEDRATE 15 04/05/2022   ESRSEDRATE 23 (H) 07/20/2021   ESRSEDRATE 9 02/01/2021   CRP <1.0 04/05/2022   CRP <1.0 11/10/2019   LABURIC 3.9 (L) 04/05/2022     Lab Results  Component Value Date   ALBUMIN 4.3 08/05/2023   ALBUMIN 3.9 02/20/2023   ALBUMIN 3.6 11/13/2022    Lab Results  Component Value Date   MG 1.9  11/13/2022   Lab Results  Component Value Date   VD25OH 20.42 (L) 04/05/2022   VD25OH 15.81 (L) 07/20/2021    No results found for: "PREALBUMIN"    Latest Ref Rng & Units 08/05/2023    4:09 PM 02/20/2023   12:28 PM 11/13/2022    6:30 AM  CBC EXTENDED  WBC 4.0 - 10.5 K/uL 5.7  3.5  8.1   RBC 4.22 - 5.81 MIL/uL 6.74  6.25  6.69   Hemoglobin 13.0 - 17.0 g/dL 19.1  47.8  29.5   HCT 39.0 - 52.0 % 45.0  41.2  45.2   Platelets 150 - 400 K/uL 185  162  169   NEUT# 1.7 - 7.7 K/uL 2.5  1.5    Lymph# 0.7 - 4.0 K/uL 2.3  1.5       There is no height or weight on file to calculate BMI.  Orders:  No orders of the defined types were placed in this encounter.  No orders of the defined types were placed in this encounter.    Procedures: No procedures performed  Clinical Data: No additional findings.  ROS:  All other systems negative, except as noted in the HPI. Review of Systems  Objective: Vital Signs:  There were no vitals taken for this visit.  Specialty Comments:  No specialty comments available.  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   Contusion of ulnar nerve, initial encounter 11/09/2022   Dyslipidemia, goal LDL below 70 09/23/2022   Elevated PSA, between 10 and less than 20 ng/ml 09/17/2022   Accelerated hypertension 09/17/2022   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   Vitamin D deficiency 09/07/2021   Past Medical History:  Diagnosis Date   Hyperlipidemia    Hypertension    Lumbar disc disease    Prostate cancer (HCC)     Family History  Problem Relation Age of Onset    High blood pressure Mother     Past Surgical History:  Procedure Laterality Date   LUMBAR DISC SURGERY  2014   LUMBAR DISC SURGERY     right ankle fracture  1978   external pinning   Social History   Occupational History   Not on file  Tobacco Use   Smoking status: Never    Passive exposure: Never   Smokeless tobacco: Never  Vaping Use   Vaping status: Never Used  Substance and Sexual Activity   Alcohol use: Not Currently    Comment: rare   Drug use: No   Sexual activity: Yes

## 2023-09-05 ENCOUNTER — Telehealth: Payer: Self-pay | Admitting: Neurology

## 2023-09-05 NOTE — Telephone Encounter (Signed)
Pt state that he fell oct 25th and has four fracture ribs . He would like to speak to someone about PT and what Dr [name] wants to do

## 2023-09-10 ENCOUNTER — Ambulatory Visit: Payer: Medicare Other | Admitting: Physical Therapy

## 2023-09-16 DIAGNOSIS — G2582 Stiff-man syndrome: Secondary | ICD-10-CM | POA: Diagnosis not present

## 2023-09-17 ENCOUNTER — Ambulatory Visit: Payer: Medicare Other | Admitting: Physical Therapy

## 2023-09-17 DIAGNOSIS — G2582 Stiff-man syndrome: Secondary | ICD-10-CM | POA: Diagnosis not present

## 2023-09-18 ENCOUNTER — Telehealth: Payer: Self-pay | Admitting: Neurology

## 2023-09-18 NOTE — Telephone Encounter (Signed)
Shalan with [facility] called, they need clarification on medication gammaked 10% 10g per 100 ml.  Number is [phone] Fax is [phone]

## 2023-09-19 DIAGNOSIS — G2582 Stiff-man syndrome: Secondary | ICD-10-CM | POA: Diagnosis not present

## 2023-09-20 DIAGNOSIS — G2582 Stiff-man syndrome: Secondary | ICD-10-CM | POA: Diagnosis not present

## 2023-09-22 DIAGNOSIS — G2582 Stiff-man syndrome: Secondary | ICD-10-CM | POA: Diagnosis not present

## 2023-09-23 NOTE — Telephone Encounter (Signed)
Pt called in stating he did not have any adverse reactions with the gammaked.  He also wants to find out when the once a month part of the treatment plan is going to start?

## 2023-09-23 NOTE — Telephone Encounter (Signed)
Called Pt and he just wanted to know when and other part of medication would start in a month. I told him the infusion center would make that appointment. He reports the the valium was making he feet swelling/. Per Dr. [name] it is more likely the IVIG, Elevate feet.

## 2023-09-23 NOTE — Telephone Encounter (Signed)
Left message on machine for patient to call back.

## 2023-09-24 ENCOUNTER — Ambulatory Visit: Payer: Medicare Other | Admitting: Physical Therapy

## 2023-09-24 ENCOUNTER — Telehealth: Payer: Self-pay | Admitting: Neurology

## 2023-09-24 NOTE — Telephone Encounter (Signed)
Called patient to check on symptoms. He recently completed the loading dose of IVIg. He feels that he may have improved some since the infusion. He is overall doing well. He reports no further falls.  The plan is to continue monthly IVIg (1g/kg) for at least 6 months then reassess.  All questions were answered.  Jacquelyne Balint, MD [facility]

## 2023-10-01 ENCOUNTER — Ambulatory Visit: Payer: Medicare Other | Admitting: Physical Therapy

## 2023-10-02 DIAGNOSIS — K080 Exfoliation of teeth due to systemic causes: Secondary | ICD-10-CM | POA: Diagnosis not present

## 2023-10-08 ENCOUNTER — Ambulatory Visit: Payer: Medicare Other | Admitting: Physical Therapy

## 2023-10-15 ENCOUNTER — Ambulatory Visit: Payer: Medicare Other | Admitting: Physical Therapy

## 2023-10-19 DIAGNOSIS — G2582 Stiff-man syndrome: Secondary | ICD-10-CM | POA: Diagnosis not present

## 2023-10-21 ENCOUNTER — Telehealth: Payer: Self-pay

## 2023-10-21 NOTE — Telephone Encounter (Unsigned)
Pt came by office he thought his appointment today but was not, he just wanted to let Dr. [name] know that he has itching on his calfs . It looked to me as dry skin, he is using castrol oil. I

## 2023-10-21 NOTE — Telephone Encounter (Signed)
Called and left a message for pt that per Dr. [name] , this does not have anything to do with Stiff Persons diease that he can try using a good lotion for the dryness. Or call your PCP to let them know.

## 2023-11-03 ENCOUNTER — Other Ambulatory Visit: Payer: Self-pay | Admitting: Neurology

## 2023-11-03 DIAGNOSIS — R760 Raised antibody titer: Secondary | ICD-10-CM

## 2023-11-06 ENCOUNTER — Ambulatory Visit (HOSPITAL_COMMUNITY)
Admission: RE | Admit: 2023-11-06 | Discharge: 2023-11-06 | Disposition: A | Discharge status: Home / Self Care | Payer: Federal, State, Local not specified - PPO | Source: Ambulatory Visit | Attending: Hematology and Oncology | Admitting: Hematology and Oncology

## 2023-11-06 ENCOUNTER — Other Ambulatory Visit: Payer: Self-pay | Admitting: *Deleted

## 2023-11-06 ENCOUNTER — Inpatient Hospital Stay: Payer: Federal, State, Local not specified - PPO | Attending: Hematology and Oncology

## 2023-11-06 DIAGNOSIS — D472 Monoclonal gammopathy: Secondary | ICD-10-CM | POA: Insufficient documentation

## 2023-11-06 DIAGNOSIS — D649 Anemia, unspecified: Secondary | ICD-10-CM | POA: Insufficient documentation

## 2023-11-06 DIAGNOSIS — M47814 Spondylosis without myelopathy or radiculopathy, thoracic region: Secondary | ICD-10-CM | POA: Diagnosis not present

## 2023-11-06 DIAGNOSIS — M47816 Spondylosis without myelopathy or radiculopathy, lumbar region: Secondary | ICD-10-CM | POA: Diagnosis not present

## 2023-11-06 DIAGNOSIS — R29818 Other symptoms and signs involving the nervous system: Secondary | ICD-10-CM | POA: Diagnosis not present

## 2023-11-06 LAB — CBC WITH DIFFERENTIAL (CANCER CENTER ONLY)
Abs Immature Granulocytes: 0 10*3/uL (ref 0.00–0.07)
Basophils Absolute: 0 10*3/uL (ref 0.0–0.1)
Basophils Relative: 0 %
Eosinophils Absolute: 0 10*3/uL (ref 0.0–0.5)
Eosinophils Relative: 0 %
HCT: 38.3 % — ABNORMAL LOW (ref 39.0–52.0)
Hemoglobin: 12.9 g/dL — ABNORMAL LOW (ref 13.0–17.0)
Immature Granulocytes: 0 %
Lymphocytes Relative: 43 %
Lymphs Abs: 1.8 10*3/uL (ref 0.7–4.0)
MCH: 23.9 pg — ABNORMAL LOW (ref 26.0–34.0)
MCHC: 33.7 g/dL (ref 30.0–36.0)
MCV: 71.1 fL — ABNORMAL LOW (ref 80.0–100.0)
Monocytes Absolute: 0.4 10*3/uL (ref 0.1–1.0)
Monocytes Relative: 10 %
Neutro Abs: 1.9 10*3/uL (ref 1.7–7.7)
Neutrophils Relative %: 47 %
Platelet Count: 237 10*3/uL (ref 150–400)
RBC: 5.39 MIL/uL (ref 4.22–5.81)
RDW: 13.5 % (ref 11.5–15.5)
WBC Count: 4.1 10*3/uL (ref 4.0–10.5)
nRBC: 0 % (ref 0.0–0.2)

## 2023-11-06 LAB — CMP (CANCER CENTER ONLY)
ALT: 13 U/L (ref 0–44)
AST: 17 U/L (ref 15–41)
Albumin: 3.9 g/dL (ref 3.5–5.0)
Alkaline Phosphatase: 89 U/L (ref 38–126)
Anion gap: 5 (ref 5–15)
BUN: 12 mg/dL (ref 8–23)
CO2: 28 mmol/L (ref 22–32)
Calcium: 9.1 mg/dL (ref 8.9–10.3)
Chloride: 106 mmol/L (ref 98–111)
Creatinine: 0.79 mg/dL (ref 0.61–1.24)
GFR, Estimated: >60 mL/min (ref >60)
Glucose, Bld: 90 mg/dL (ref 70–99)
Potassium: 3.6 mmol/L (ref 3.5–5.1)
Sodium: 139 mmol/L (ref 135–145)
Total Bilirubin: 0.7 mg/dL (ref 0.0–1.2)
Total Protein: 8.1 g/dL (ref 6.5–8.1)

## 2023-11-06 LAB — LACTATE DEHYDROGENASE: LDH: 127 U/L (ref 98–192)

## 2023-11-07 LAB — KAPPA/LAMBDA LIGHT CHAINS
Kappa free light chain: 29.9 mg/L — ABNORMAL HIGH (ref 3.3–19.4)
Kappa, lambda light chain ratio: 2.11 — ABNORMAL HIGH (ref 0.26–1.65)
Lambda free light chains: 14.2 mg/L (ref 5.7–26.3)

## 2023-11-11 LAB — UPEP/UIFE/LIGHT CHAINS/TP, 24-HR UR
% BETA, Urine: 0 %
ALPHA 1 URINE: 0 %
Albumin, U: 100 %
Alpha 2, Urine: 0 %
Free Kappa Lt Chains,Ur: 9.7 mg/L (ref 1.17–86.46)
Free Kappa/Lambda Ratio: 7.13 (ref 1.83–14.26)
Free Lambda Lt Chains,Ur: 1.36 mg/L (ref 0.27–15.21)
GAMMA GLOBULIN URINE: 0 %
Total Protein, Urine-Ur/day: 139 mg/(24.h) (ref 30–150)
Total Protein, Urine: 6.6 mg/dL
Total Volume: 2100

## 2023-11-11 NOTE — Progress Notes (Signed)
I saw Shakeer Giorno in neurology clinic on 11/21/23 in follow up for stiff person syndrome (SPS).  HPI: Filip Trimmer is a 68 y.o. year old male with a history of degenerative disc disease s/p L5-S1 surgery, HLD, HTN, prostate cancer who we last saw on 08/21/23.  To briefly review: Initial consult 05/03/23: Patient's symptoms started in 2020. He was having abdominal pain and a colonscopy for this. He mentioned that he noticed symptoms in core with weakness (not able to sit up well). Sneezing and coughing was painful as well. He then noticed problems in his legs a year later. While sitting, his legs freeze. He cannot stand well. He has to "feel his legs under him" and start with small steps. Once he gets going, he feels he can move better. He mentions that is someone sneaks up on her or scares him, he will freeze up again. He walks with a cane. He has fallen 3 times since the beginning of 2024. He last feel about 1 week ago while trying to do lawn work. He was holding on the the mower for stability. He saw an insect that scared him, his left leg locked, then he fell.    He endorses some numbness and soreness in left hamstring area. He endorses muscle spasms (mostly in the left leg) usually when sitting. He denies twitching. He denies changes in bowel or bladder, including incontinence. He denies any symptoms in his upper extremities.   Patient had an EMG on 04/20/22 showed residuals of L5-S1 radiculopathy per report.   Patient went to ED for symptoms on 02/20/23. Electrolytes and CK were unremarkable.   Patient is on baclofen 5 mg at bedtime (has been weaning himself off of this) and gabapentin 300 mg TID (only taking BID), but has not taken anything in 4 days in preparation for this appointment. These medications do help (gabapentin more).   Any change in urine color, especially after exertion/physical activity? No   The patient denies symptoms suggestive of oculobulbar weakness including  diplopia, ptosis, dysphagia, poor saliva control, dysarthria/dysphonia, impaired mastication, facial weakness/droop. He does mention that when he swallows, he feels like he is swallowing a bubble and will cause gas.   There are no neuromuscular respiratory weakness symptoms, particularly orthopnea>dyspnea.    Pseudobulbar affect is absent.   The patient has not noticed any recent skin rashes nor does he report any constitutional symptoms like fever, night sweats, anorexia or unintentional weight loss.   EtOH use: None  Restrictive diet? Plant based diet Family history of neuropathy/myopathy/NM disease? No   Of note, there is note in patient's chart about prostate cancer. This is just being monitored currently per patient. Per patient, he does think he has cancer.   Mother has moved in with patient to help him out.   05/29/23: Overall, patient is similar to prior. He had another fall in the last week.   Initial labs on 05/03/23 were significant for an IgG kappa monoclonal antibody. I referred patient to hematology for this. This consultation is scheduled 06/10/23.   EMG on 05/14/23 showed an active left S1 radiculopathy (similar to prior EMG), but I also noticed spasms or jerking whenever I would touch the patient. It was difficult for patient to relax any muscles, including with antagonist contraction (co-contraction suspected). There was no other significant abnormalities. Given this, I added on some labs including GAD65 due to a suspicion for stiff person syndrome. GAD65 antibodies were positive (> 250 with normal <  5).    Patient is taking gabapentin as needed. It makes him drowsy. Patient is not taking baclofen currently.  08/21/23: Patient states he is doing much better. He is doing PT. He has not fallen recently. He feels like his core strength is better. He also feels looser and able to move better. He is still taking Valium 5 mg 3-4 times daily.   Patient was seen by hematology and is  being monitored for MGUS.   CT chest/abd/pelvis on 06/17/23 showed no evidence of malignancy.  Most recent Assessment and Plan (08/21/23): This is [name], a 68 y.o. male with muscle spasms and tightness in trunk and lower extremities with exaggerated startle, imbalance, and falls. His EMG showed evidence of an active left S1 radiculopathy, but more importantly, agonist/antagonist co-contraction. His GAD65 antibodies are also very elevated. 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 I again discussed SPS with patient and given him information on SPS printed from [facility]. His CT C/A/P showed no evidence of malignancy. IFE did show M protein for which he is following with hematology for MGUS.   Plan: -Will start IVIg, likely for 6 months before trying to taper. Will load with 2 g/kg over 5 days followed by 1 g/kg monthly for 5 additional months. Patient prefers home infusion - will communicate with hematology (Dr. Bertis Ruddy) about this to ensure this is not problematic for their testing -Continue Valium 5 mg 3-4 times daily -Continue PT -Follow up with hematology as planned for MGUS  Since their last visit: Patient received his loading dose of IVIg at the end of 08/2023 and had maintenance dosing 09/2023, and 11/16/23. He is happy with the home infusion company. He continues to take valium 5 mg, but maybe only taking once a day at night. It does make him sleepy, so he cut back on it. He will take another dose if he has to go out into public.  He continues to feel better and less stiff. He thinks his walking is better. When it is cold, he finds the muscles shiver more and then he'll lose control of his legs.   He has not had any falls since 07/2023.   MEDICATIONS:  Outpatient Encounter Medications as of 11/21/2023  Medication Sig   diazepam (VALIUM) 5 MG tablet Take 1 tablet (5 mg total) by mouth every 6 (six) hours as needed  for anxiety.   GARLIC PO Take by mouth daily.   hydrALAZINE (APRESOLINE) 25 MG tablet Take 1 tablet (25 mg total) by mouth 3 (three) times daily.   triamterene-hydrochlorothiazide (DYAZIDE) 37.5-25 MG capsule Take 1 each (1 capsule total) by mouth daily.   No facility-administered encounter medications on file as of 11/21/2023.    PAST MEDICAL HISTORY: Past Medical History:  Diagnosis Date   Hyperlipidemia    Hypertension    Lumbar disc disease    Prostate cancer (HCC)     PAST SURGICAL HISTORY: Past Surgical History:  Procedure Laterality Date   LUMBAR DISC SURGERY  2014   LUMBAR DISC SURGERY     right ankle fracture  1978   external pinning    ALLERGIES: No Known Allergies  FAMILY HISTORY: Family History  Problem Relation Age of Onset   High blood pressure Mother     SOCIAL HISTORY: Social History   Tobacco Use   Smoking status: Never    Passive exposure: Never   Smokeless tobacco: Never  Vaping Use   Vaping  status: Never Used  Substance Use Topics   Alcohol use: Not Currently    Comment: rare   Drug use: No   Social History   Social History Narrative   Right Handed    Lives in a two story home.   Are you right handed or left handed? Right    Are you currently employed ?    What is your current occupation? retired   Do you live at home alone? no   Who lives with you? Mother   What type of home do you live in: 1 story or 2 story? two        Objective:  Vital Signs:  BP (!) 148/81   Pulse (!) 47   Ht 6\' 3"  (1.905 m)   Wt 235 lb (106.6 kg)   SpO2 98%   BMI 29.37 kg/m   General: General appearance: Awake and alert. No distress. Cooperative with exam.  Skin: No obvious rash or jaundice. HEENT: Atraumatic. Anicteric. Lungs: Non-labored breathing on room air  Extremities: Bilateral lower extremity edema  Neurological: Mental Status: Alert. Speech fluent. No pseudobulbar affect Cranial Nerves: CNII: No RAPD. Visual fields intact. CNIII,  IV, VI: PERRL. No nystagmus. EOMI. CN V: Facial sensation intact bilaterally to fine touch. CN VII: Facial muscles symmetric and strong. No ptosis at rest. CN VIII: Hears finger rub well bilaterally. CN IX: No hypophonia. CN X: Palate elevates symmetrically. CN XI: Full strength shoulder shrug bilaterally. CN XII: Tongue protrusion full and midline. No atrophy or fasciculations. No significant dysarthria Motor: Tone is normal. Strength is 5/5 in bilateral upper and lower extremities except 4+ to 5- in bilateral hip flexion Reflexes:  Right Left  Bicep 2+ 2+  Tricep 2+ 2+  BrRad 2+ 2+  Knee 2+ 2+  Ankle 2+ 2+   Sensation: Pinprick intact in all extremities Coordination: Intact finger-to- nose-finger bilaterally. Gait: Wide-based gait. Stiff, but steady without assistance.   Lab and Test Review: New results: 11/06/23: UPEP/UIFE: no M protein MM panel: IgG kappa monoclonal gammopathy CMP unremarkable CBC w/ diff significant for MCV of 71.1  Previously reviewed results: 08/05/23: CBC w/ diff: significant for MCV 66.8 (chronic) CMP unremarkable LDH wnl K/L ratio: 1.95 (mildly elevated) MM panel: IgG kappa monoclonal protein   05/14/23: Copper wnl VEGF wnl Kappa/lambda light chains: elevated to 1.79 GAD65: elevated to > 250 international units/mL IA-2 ab: elevated to  335.4 international units/mL Insulin abs elevated to 0.8 units/mL  02/20/23: CK 115 CBC significant for MCV of 65.9 (chronic) CMP unremarkable   11/22/22: PSA: 10.42 Lipid panel significant for LDL of 116 TSH 5.02 (wnl) OAC1Y: 5.9   04/05/22: ANA positive at 1:40 ACE wnl Ionized Ca wnl PTH wnl Vit D: 20.42 CRP < 1.0 ESR wnl (15) CCP negative RF negative B12: 1303 Ferritin 25.7    CT lumbar spine and myelogram (11/02/22): FINDINGS: LUMBAR MYELOGRAM FINDINGS:   Unchanged trace retrolisthesis at T12-L1 and L1-L2. No dynamic instability. Small ventral extradural defects from T12-L1  through L4-L5. No spinal canal stenosis or nerve root effacement.   CT LUMBAR MYELOGRAM FINDINGS:   Segmentation: Standard.   Alignment: Unchanged mild levocurvature. Unchanged trace retrolisthesis at T12-L1 and L1-L2.   Vertebrae: No acute fracture or other focal pathologic process. Small L3 bone island.   Conus medullaris and cauda equina: Conus extends to the T12-L1 level. Conus and cauda equina appear normal.   Paraspinal and other soft tissues: Aortoiliac atherosclerotic vascular disease.   Disc levels:   T12-L1:  Unchanged small broad-based posterior disc osteophyte complex and mild left facet arthropathy. No stenosis.   L1-L2: Unchanged small shallow right paracentral disc osteophyte complex and mild bilateral facet arthropathy. No stenosis.   L2-L3: Unchanged small broad-based posterior disc osteophyte complex and mild bilateral facet arthropathy. Unchanged mild right neuroforaminal stenosis. No spinal canal or left neuroforaminal stenosis.   L3-L4: Unchanged small broad-based posterior disc osteophyte complex and bilateral facet arthropathy. Unchanged mild bilateral neuroforaminal stenosis. No spinal canal stenosis.   L4-L5: Unchanged small broad-based posterior disc osteophyte complex and mild bilateral facet arthropathy. No stenosis.   L5-S1: Prior left hemilaminectomy. Unchanged tiny broad-based posterior disc protrusion and mild-to-moderate bilateral facet arthropathy. No stenosis.   IMPRESSION: 1. Unchanged mild multilevel lumbar spondylosis as described above. No high-grade stenosis or impingement. 2. Unchanged trace retrolisthesis at T12-L1 and L1-L2. No dynamic instability. 3.  Aortic Atherosclerosis (ICD10-I70.0).   MRI lumbar spine wo contrast (09/30/2010): IMPRESSION:  1. Probable left S1 nerve root encroachment at L5-S1, felt to be  due to a combination of a posterolateral disc protrusion and a  probable synovial cyst extending medially from  the left facet  joint.  This seems to be the most likely explanation for the  patient's symptoms.  Correlation with level of radicular symptoms  is recommended.  2.  Mild disc bulging and facet disease at the additional levels  with mild inferior foraminal narrowing as detailed above.  There is  the potential for extraforaminal nerve root encroachment on the  left at L2-L3, not definite.  No other significant spinal stenosis  is demonstrated.    EMG (04/20/22 by Dr. Wynn Banker):     EMG (05/14/23): NCV & EMG Findings: Extensive electrodiagnostic evaluation of the left lower limb with additional nerve conduction studies of the left upper limb and needle examination of the left thoracic paraspinal muscles (T8 and T5 levels) shows: Left sural and superficial peroneal/fibular sensory responses are absent. Left ulnar sensory response shows reduced amplitude (4 V). Left median and radial sensory responses are within normal limits. Left peroneal/fibular (EDB) motor response shows reduced amplitude (1.31 mV). Left peroneal/fibular (TA), tibial (AH), median (APB), and ulnar (ADM) motor responses are within normal limits. Left H reflex is absent. Chronic motor axon loss changes are seen in the left medial head of gastrocnemius and left short head of biceps femoris muscles, with active denervation changes also seen in the medial head of gastrocnemius muscle. All muscles were difficult to relax, even with antagonist muscle contraction. Lumbosacral paraspinal muscles were deferred due to prior lumbosacral spine surgery.   Impression: This is an abnormal study. The findings are most consistent with the following: Evidence of an active/ongoing intraspinal canal lesion (ie: motor radiculopathy) at the left S1 root or segment, moderate in degree electrically. Evidence of a length dependent, large fiber sensorimotor neuropathy, axon loss in type, mild to moderate in degree electrically. No electrodiagnostic  evidence of demyelinating neuropathy. No electrodiagnostic evidence of a left median mononeuropathy at or distal to the wrist (ie: carpal tunnel syndrome).   CT chest/abd/pelvis (06/17/23): FINDINGS: CT CHEST FINDINGS   Cardiovascular: Normal caliber thoracic aorta normal size heart. No significant pericardial effusion/thickening.   Mediastinum/Nodes: No suspicious thyroid nodule. No pathologically enlarged mediastinal, hilar or axillary lymph nodes. Patulous distal esophagus   Lungs/Pleura: No suspicious pulmonary nodules or masses. Bibasilar atelectasis/scarring. No pleural effusion. No pneumothorax   Musculoskeletal: No aggressive lytic or blastic lesion of bone. Subacute non/minimally displaced left rib fractures.   CT ABDOMEN PELVIS FINDINGS  Hepatobiliary: Unremarkable noncontrast enhanced appearance of the hepatic parenchyma. Gallbladder is unremarkable. No biliary ductal dilation.   Pancreas: No pancreatic ductal dilation or evidence of acute inflammation.   Spleen: No splenomegaly.   Adrenals/Urinary Tract: Bilateral adrenal glands are within normal limits. No hydronephrosis. No renal, ureteral or bladder calculi. Retroaortic left renal vein. Urinary bladder is unremarkable for degree of distension   Stomach/Bowel: Radiopaque enteric contrast material traverses the rectum. Stomach is unremarkable for degree of distension. No pathologic dilation of small or large bowel. Normal appendix. Moderate volume of formed stool in the colon. Left-sided colonic diverticulosis without findings of acute diverticulitis.   Vascular/Lymphatic: Aortic atherosclerosis. Normal caliber abdominal aorta. Smooth IVC contours. Retroaortic left renal vein. No pathologically enlarged abdominal or pelvic lymph nodes   Reproductive: Enlarged prostate gland.   Other: No significant abdominopelvic free fluid.   Musculoskeletal: No aggressive lytic or blastic lesion of bone. Multilevel  degenerative changes spine   IMPRESSION: 1. No acute abnormality in the chest, abdomen or pelvis. 2. No suspicious mass or adenopathy identified in the chest, abdomen or pelvis. 3. Moderate volume of formed stool in the colon. 4. Left-sided colonic diverticulosis without findings of acute diverticulitis. 5. Enlarged prostate gland. 6. Subacute non/minimally displaced left rib fractures. 7.  Aortic Atherosclerosis (ICD10-I70.0).  ASSESSMENT: This is [name], a 68 y.o. male with muscle spasms and tightness in trunk and lower extremities with exaggerated startle, imbalance, and falls. His EMG showed evidence of an active left S1 radiculopathy, but more importantly, agonist/antagonist co-contraction. His GAD65 antibodies are also very elevated. 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 His CT C/A/P showed no evidence of malignancy. IFE did show M protein for which he is following with hematology for MGUS. Skeletal survey showed no lytic lesions.  Plan: -Continue Valium 5 mg up to 4 times daily -Continue monthly IVIg; next is 12/21/23 -Continue PT - patient will get more when he is released by ortho (for rib fractures) -Fall precautions discussed -Follow up with hematology as planned for MGUS  Return to clinic in 3 months  Jacquelyne Balint, MD

## 2023-11-13 LAB — MULTIPLE MYELOMA PANEL, SERUM
Albumin SerPl Elph-Mcnc: 3.5 g/dL (ref 2.9–4.4)
Albumin/Glob SerPl: 0.9 (ref 0.7–1.7)
Alpha 1: 0.2 g/dL (ref 0.0–0.4)
Alpha2 Glob SerPl Elph-Mcnc: 0.6 g/dL (ref 0.4–1.0)
B-Globulin SerPl Elph-Mcnc: 1 g/dL (ref 0.7–1.3)
Gamma Glob SerPl Elph-Mcnc: 2.3 g/dL — ABNORMAL HIGH (ref 0.4–1.8)
Globulin, Total: 4.2 g/dL — ABNORMAL HIGH (ref 2.2–3.9)
IgA: 332 mg/dL (ref 61–437)
IgG (Immunoglobin G), Serum: 2600 mg/dL — ABNORMAL HIGH (ref 603–1613)
IgM (Immunoglobulin M), Srm: 71 mg/dL (ref 20–172)
M Protein SerPl Elph-Mcnc: 1 g/dL — ABNORMAL HIGH
Total Protein ELP: 7.7 g/dL (ref 6.0–8.5)

## 2023-11-14 NOTE — Assessment & Plan Note (Deleted)
I have reviewed his myeloma panel with the patient Overall, his M protein appears to be elevated slightly compared to his blood work 3 months ago with increased IgG level as well However, his light chains are just borderline elevated without associated renal dysfunction or hypercalcemia The patient is mildly anemic but not symptomatic I recommend close monitoring and follow-up in 3 months

## 2023-11-14 NOTE — Assessment & Plan Note (Deleted)
I suspect he might have thalassemia Observe only Plan to order iron studies with his next visit

## 2023-11-15 ENCOUNTER — Inpatient Hospital Stay: Payer: Federal, State, Local not specified - PPO | Admitting: Hematology and Oncology

## 2023-11-15 ENCOUNTER — Encounter: Payer: Self-pay | Admitting: Hematology and Oncology

## 2023-11-15 DIAGNOSIS — D472 Monoclonal gammopathy: Secondary | ICD-10-CM

## 2023-11-15 DIAGNOSIS — R718 Other abnormality of red blood cells: Secondary | ICD-10-CM

## 2023-11-16 DIAGNOSIS — G2582 Stiff-man syndrome: Secondary | ICD-10-CM | POA: Diagnosis not present

## 2023-11-19 ENCOUNTER — Telehealth: Payer: Self-pay

## 2023-11-19 NOTE — Telephone Encounter (Signed)
Returned his call. His missed recent appt due ice on drive way. Rescheduled appt to 1/24 and he is aware of appt time/date.

## 2023-11-21 ENCOUNTER — Telehealth: Payer: Self-pay | Admitting: Physician Assistant

## 2023-11-21 ENCOUNTER — Encounter: Payer: Self-pay | Admitting: Neurology

## 2023-11-21 ENCOUNTER — Ambulatory Visit: Payer: Medicare Other | Admitting: Neurology

## 2023-11-21 ENCOUNTER — Other Ambulatory Visit: Payer: Self-pay | Admitting: Physician Assistant

## 2023-11-21 VITALS — BP 148/81 | HR 47 | Ht 75.0 in | Wt 235.0 lb

## 2023-11-21 DIAGNOSIS — M5417 Radiculopathy, lumbosacral region: Secondary | ICD-10-CM

## 2023-11-21 DIAGNOSIS — R2689 Other abnormalities of gait and mobility: Secondary | ICD-10-CM

## 2023-11-21 DIAGNOSIS — R269 Unspecified abnormalities of gait and mobility: Secondary | ICD-10-CM

## 2023-11-21 DIAGNOSIS — R198 Other specified symptoms and signs involving the digestive system and abdomen: Secondary | ICD-10-CM | POA: Diagnosis not present

## 2023-11-21 DIAGNOSIS — G2582 Stiff-man syndrome: Secondary | ICD-10-CM

## 2023-11-21 DIAGNOSIS — R760 Raised antibody titer: Secondary | ICD-10-CM | POA: Diagnosis not present

## 2023-11-21 DIAGNOSIS — W19XXXS Unspecified fall, sequela: Secondary | ICD-10-CM

## 2023-11-21 DIAGNOSIS — R29898 Other symptoms and signs involving the musculoskeletal system: Secondary | ICD-10-CM | POA: Diagnosis not present

## 2023-11-21 DIAGNOSIS — D472 Monoclonal gammopathy: Secondary | ICD-10-CM

## 2023-11-21 NOTE — Addendum Note (Signed)
Addended by: Antony Madura on: 11/21/2023 12:27 PM   Modules accepted: Level of Service

## 2023-11-21 NOTE — Patient Instructions (Addendum)
-Continue Valium 5 mg up to 4 times daily -Continue monthly IVIg; next is 12/21/23  I will see back in 3 months. Please let me know if you have any questions or concerns in the meantime.   The physicians and staff at [facility] are committed to providing excellent care. You may receive a survey requesting feedback about your experience at our office. We strive to receive "very good" responses to the survey questions. If you feel that your experience would prevent you from giving the office a "very good " response, please contact our office to try to remedy the situation. We may be reached at [phone]. Thank you for taking the time out of your busy day to complete the survey.  Jacquelyne Balint, MD [facility]  Preventing Falls at [facility] are common, often dreaded events in the lives of older people. Aside from the obvious injuries and even death that may result, fall can cause wide-ranging consequences including loss of independence, mental decline, decreased activity and mobility. Younger people are also at risk of falling, especially those with chronic illnesses and fatigue.  Ways to reduce risk for falling Examine diet and medications. Warm foods and alcohol dilate blood vessels, which can lead to dizziness when standing. Sleep aids, antidepressants and pain medications can also increase the likelihood of a fall.  Get a vision exam. Poor vision, cataracts and glaucoma increase the chances of falling.  Check foot gear. Shoes should fit snugly and have a sturdy, nonskid sole and a broad, low heel  Participate in a physician-approved exercise program to build and maintain muscle strength and improve balance and coordination. Programs that use ankle weights or stretch bands are excellent for muscle-strengthening. Water aerobics programs and low-impact Tai Chi programs have also been shown to improve balance and coordination.  Increase vitamin D intake. Vitamin D improves muscle  strength and increases the amount of calcium the body is able to absorb and deposit in bones.  How to prevent falls from common hazards Floors - Remove all loose wires, cords, and throw rugs. Minimize clutter. Make sure rugs are anchored and smooth. Keep furniture in its usual place.  Chairs -- Use chairs with straight backs, armrests and firm seats. Add firm cushions to existing pieces to add height.  Bathroom - Install grab bars and non-skid tape in the tub or shower. Use a bathtub transfer bench or a shower chair with a back support Use an elevated toilet seat and/or safety rails to assist standing from a low surface. Do not use towel racks or bathroom tissue holders to help you stand.  Lighting - Make sure halls, stairways, and entrances are well-lit. Install a night light in your bathroom or hallway. Make sure there is a light switch at the top and bottom of the staircase. Turn lights on if you get up in the middle of the night. Make sure lamps or light switches are within reach of the bed if you have to get up during the night.  Kitchen - Install non-skid rubber mats near the sink and stove. Clean spills immediately. Store frequently used utensils, pots, pans between waist and eye level. This helps prevent reaching and bending. Sit when getting things out of lower cupboards.  Living room/ Bedrooms - Place furniture with wide spaces in between, giving enough room to move around. Establish a route through the living room that gives you something to hold onto as you walk.  Stairs - Make sure treads, rails, and rugs are  secure. Install a rail on both sides of the stairs. If stairs are a threat, it might be helpful to arrange most of your activities on the lower level to reduce the number of times you must climb the stairs.  Entrances and doorways - Install metal handles on the walls adjacent to the doorknobs of all doors to make it more secure as you travel through the doorway.  Tips for  maintaining balance Keep at least one hand free at all times. Try using a backpack or fanny pack to hold things rather than carrying them in your hands. Never carry objects in both hands when walking as this interferes with keeping your balance.  Attempt to swing both arms from front to back while walking. This might require a conscious effort if Parkinson's disease has diminished your movement. It will, however, help you to maintain balance and posture, and reduce fatigue.  Consciously lift your feet off of the ground when walking. Shuffling and dragging of the feet is a common culprit in losing your balance.  When trying to navigate turns, use a "U" technique of facing forward and making a wide turn, rather than pivoting sharply.  Try to stand with your feet shoulder-length apart. When your feet are close together for any length of time, you increase your risk of losing your balance and falling.  Do one thing at a time. Don't try to walk and accomplish another task, such as reading or looking around. The decrease in your automatic reflexes complicates motor function, so the less distraction, the better.  Do not wear rubber or gripping soled shoes, they might "catch" on the floor and cause tripping.  Move slowly when changing positions. Use deliberate, concentrated movements and, if needed, use a grab bar or walking aid. Count 15 seconds between each movement. For example, when rising from a seated position, wait 15 seconds after standing to begin walking.  If balance is a continuous problem, you might want to consider a walking aid such as a cane, walking stick, or walker. Once you've mastered walking with help, you might be ready to try it on your own again.

## 2023-11-21 NOTE — Telephone Encounter (Signed)
Pt came in for PA Michael [name] an stating he need letter to start his rehab/ physical therapy. Pt need referral to be sent to [facility] Attention [name]. Pt phone number is [phone].

## 2023-11-22 ENCOUNTER — Inpatient Hospital Stay (HOSPITAL_BASED_OUTPATIENT_CLINIC_OR_DEPARTMENT_OTHER): Payer: Federal, State, Local not specified - PPO | Admitting: Hematology and Oncology

## 2023-11-22 ENCOUNTER — Encounter: Payer: Self-pay | Admitting: Hematology and Oncology

## 2023-11-22 VITALS — BP 157/64 | HR 50 | Temp 97.8°F | Resp 18 | Ht 75.0 in | Wt 241.8 lb

## 2023-11-22 DIAGNOSIS — R718 Other abnormality of red blood cells: Secondary | ICD-10-CM | POA: Diagnosis not present

## 2023-11-22 DIAGNOSIS — D649 Anemia, unspecified: Secondary | ICD-10-CM | POA: Diagnosis not present

## 2023-11-22 DIAGNOSIS — D472 Monoclonal gammopathy: Secondary | ICD-10-CM

## 2023-11-22 DIAGNOSIS — R29818 Other symptoms and signs involving the nervous system: Secondary | ICD-10-CM | POA: Diagnosis not present

## 2023-11-22 NOTE — Progress Notes (Signed)
[facility] OFFICE PROGRESS NOTE  Patient Care Team: [name], MD as PCP - General (Internal Medicine) Antony Madura, MD as Consulting Physician (Neurology)  ASSESSMENT & PLAN:  MGUS (monoclonal gammopathy of unknown significance) I have reviewed his myeloma panel with the patient Overall, his M protein appears to be elevated slightly compared to his blood work 3 months ago His IgG level is high because he has been receiving IVIG at home However, his light chains are just borderline elevated without associated renal dysfunction or hypercalcemia The patient is mildly anemic but not symptomatic I recommend close monitoring and follow-up in 6 months  RBC microcytosis I suspect he might have thalassemia Observe only  Orders Placed This Encounter  Procedures   Comprehensive metabolic panel    Standing Status:   Standing    Number of Occurrences:   33    Expiration Date:   11/21/2024   CBC with Differential/Platelet    Standing Status:   Standing    Number of Occurrences:   22    Expiration Date:   11/21/2024    All questions were answered. The patient knows to call the clinic with any problems, questions or concerns. The total time spent in the appointment was 20 minutes encounter with patients including review of chart and various tests results, discussions about plan of care and coordination of care plan   Artis Delay, MD 11/22/2023 12:09 PM  INTERVAL HISTORY: Please see below for problem oriented charting. he returns for surveillance follow-up He missed his appointment recently He is doing well We discussed test results and future follow-up We discussed natural history of MGUS  REVIEW OF SYSTEMS:   Constitutional: Denies fevers, chills or abnormal weight loss Eyes: Denies blurriness of vision Ears, nose, mouth, throat, and face: Denies mucositis or sore throat Respiratory: Denies cough, dyspnea or wheezes Cardiovascular: Denies palpitation, chest  discomfort or lower extremity swelling Gastrointestinal:  Denies nausea, heartburn or change in bowel habits Skin: Denies abnormal skin rashes Lymphatics: Denies new lymphadenopathy or easy bruising Neurological:Denies numbness, tingling or new weaknesses Behavioral/Psych: Mood is stable, no new changes  All other systems were reviewed with the patient and are negative.  I have reviewed the past medical history, past surgical history, social history and family history with the patient and they are unchanged from previous note.  ALLERGIES:  has no known allergies.  MEDICATIONS:  Current Outpatient Medications  Medication Sig Dispense Refill   GAMUNEX-C 5 GM/50ML SOLN      diazepam (VALIUM) 5 MG tablet Take 1 tablet (5 mg total) by mouth every 6 (six) hours as needed for anxiety. 30 tablet 5   GARLIC PO Take by mouth daily.     hydrALAZINE (APRESOLINE) 25 MG tablet Take 1 tablet (25 mg total) by mouth 3 (three) times daily. 270 tablet 0   triamterene-hydrochlorothiazide (DYAZIDE) 37.5-25 MG capsule Take 1 each (1 capsule total) by mouth daily. 90 capsule 0   No current facility-administered medications for this visit.    SUMMARY OF ONCOLOGIC HISTORY: Oncology History  Prostate cancer (HCC); T1c; GG 1; initial diagnosis 2011  12/05/2022 Initial Diagnosis   Prostate cancer (HCC); T1c; GG 1; initial diagnosis 2011   06/10/2023 Cancer Staging   Staging form: Prostate, AJCC 8th Edition - Clinical stage from 06/10/2023: cT1, cN0, cM0 - Signed by Artis Delay, MD on 06/10/2023 Stage prefix: Initial diagnosis    Michael [name] 68 y.o. male is here because of recent finding of MGUS  The patient has neurological disorder and his neurologist order workup for this.  Immunofixation revealed IgG kappa MGUS Based on his blood work done at baseline, he does not have evidence of hypercalcemia, anemia or renal failure.  He is noted to have microcytosis with elevated RBC count He denies history of  abnormal bone pain or bone fracture. Patient denies history of recurrent infection or atypical infections such as shingles of meningitis. Denies chills, night sweats, anorexia or abnormal weight loss.  PHYSICAL EXAMINATION: ECOG PERFORMANCE STATUS: 0 - Asymptomatic  Vitals:   11/22/23 1135  BP: (!) 157/64  Pulse: (!) 50  Resp: 18  Temp: 97.8 F (36.6 C)  SpO2: 100%   Filed Weights   11/22/23 1135  Weight: 241 lb 12.8 oz (109.7 kg)    GENERAL:alert, no distress and comfortable  LABORATORY DATA:  I have reviewed the data as listed    Component Value Date/Time   NA 139 11/06/2023 0839   NA 146 (H) 01/05/2022 1523   K 3.6 11/06/2023 0839   CL 106 11/06/2023 0839   CO2 28 11/06/2023 0839   GLUCOSE 90 11/06/2023 0839   BUN 12 11/06/2023 0839   BUN 16 01/05/2022 1523   CREATININE 0.79 11/06/2023 0839   CALCIUM 9.1 11/06/2023 0839   PROT 8.1 11/06/2023 0839   ALBUMIN 3.9 11/06/2023 0839   AST 17 11/06/2023 0839   ALT 13 11/06/2023 0839   ALKPHOS 89 11/06/2023 0839   BILITOT 0.7 11/06/2023 0839   GFRNONAA >60 11/06/2023 0839   GFRAA  07/17/2010 1000    >60        The eGFR has been calculated using the MDRD equation. This calculation has not been validated in all clinical situations. eGFR's persistently <60 mL/min signify possible Chronic Kidney Disease.    No results found for: "SPEP", "UPEP"  Lab Results  Component Value Date   WBC 4.1 11/06/2023   NEUTROABS 1.9 11/06/2023   HGB 12.9 (L) 11/06/2023   HCT 38.3 (L) 11/06/2023   MCV 71.1 (L) 11/06/2023   PLT 237 11/06/2023      Chemistry      Component Value Date/Time   NA 139 11/06/2023 0839   NA 146 (H) 01/05/2022 1523   K 3.6 11/06/2023 0839   CL 106 11/06/2023 0839   CO2 28 11/06/2023 0839   BUN 12 11/06/2023 0839   BUN 16 01/05/2022 1523   CREATININE 0.79 11/06/2023 0839      Component Value Date/Time   CALCIUM 9.1 11/06/2023 0839   ALKPHOS 89 11/06/2023 0839   AST 17 11/06/2023 0839   ALT  13 11/06/2023 0839   BILITOT 0.7 11/06/2023 0839       RADIOGRAPHIC STUDIES: I have personally reviewed the radiological images as listed and agreed with the findings in the report. DG Bone Survey Met Result Date: 11/14/2023 CLINICAL DATA:  Staging a myeloma. MGUS. Patient reports right rib fractures in October. EXAM: METASTATIC BONE SURVEY COMPARISON:  Chest abdomen pelvis CT 06/17/2023, rib radiographs 08/26/2023 FINDINGS: No evidence of lytic or destructive bone lesions. Remote distal fibular fracture that has healed. R again seen multiple right rib fractures. Degenerative change throughout the cervical, thoracic, and lumbar spine without compression deformity. Left greater than right hip osteoarthritis. Right knee chondrocalcinosis. Emote IMPRESSION: No evidence of lytic or destructive bone lesion. Electronically Signed   By: Narda Rutherford M.D.   On: 11/14/2023 23:39

## 2023-11-22 NOTE — Assessment & Plan Note (Signed)
I have reviewed his myeloma panel with the patient Overall, his M protein appears to be elevated slightly compared to his blood work 3 months ago His IgG level is high because he has been receiving IVIG at home However, his light chains are just borderline elevated without associated renal dysfunction or hypercalcemia The patient is mildly anemic but not symptomatic I recommend close monitoring and follow-up in 6 months

## 2023-11-22 NOTE — Assessment & Plan Note (Signed)
I suspect he might have thalassemia Observe only

## 2023-11-25 ENCOUNTER — Other Ambulatory Visit: Payer: Self-pay

## 2023-11-25 ENCOUNTER — Encounter: Payer: Self-pay | Admitting: Physical Therapy

## 2023-11-25 ENCOUNTER — Ambulatory Visit: Payer: Medicare Other | Attending: Physician Assistant | Admitting: Physical Therapy

## 2023-11-25 VITALS — BP 187/61 | HR 51

## 2023-11-25 DIAGNOSIS — M25652 Stiffness of left hip, not elsewhere classified: Secondary | ICD-10-CM | POA: Diagnosis not present

## 2023-11-25 DIAGNOSIS — R2681 Unsteadiness on feet: Secondary | ICD-10-CM

## 2023-11-25 DIAGNOSIS — G2582 Stiff-man syndrome: Secondary | ICD-10-CM | POA: Insufficient documentation

## 2023-11-25 DIAGNOSIS — R252 Cramp and spasm: Secondary | ICD-10-CM

## 2023-11-25 DIAGNOSIS — M25675 Stiffness of left foot, not elsewhere classified: Secondary | ICD-10-CM | POA: Diagnosis not present

## 2023-11-25 DIAGNOSIS — R600 Localized edema: Secondary | ICD-10-CM | POA: Diagnosis not present

## 2023-11-25 DIAGNOSIS — R2689 Other abnormalities of gait and mobility: Secondary | ICD-10-CM | POA: Diagnosis not present

## 2023-11-25 DIAGNOSIS — M25662 Stiffness of left knee, not elsewhere classified: Secondary | ICD-10-CM | POA: Diagnosis not present

## 2023-11-25 DIAGNOSIS — Z9181 History of falling: Secondary | ICD-10-CM | POA: Diagnosis not present

## 2023-11-25 DIAGNOSIS — M6281 Muscle weakness (generalized): Secondary | ICD-10-CM | POA: Diagnosis not present

## 2023-11-25 DIAGNOSIS — M25672 Stiffness of left ankle, not elsewhere classified: Secondary | ICD-10-CM | POA: Diagnosis not present

## 2023-11-25 NOTE — Therapy (Signed)
OUTPATIENT PHYSICAL THERAPY NEURO EVALUATION   Patient Name: Michael [name] MRN: [id number] DOB:1956/01/26, 68 y.o., male Today's Date: 11/25/2023   PCP: Michael Grandchild, MD REFERRING PROVIDER: Persons, [place], [name]  END OF SESSION:  PT End of Session - 11/25/23 1411     Visit Number 1    Number of Visits 17   16 + eval   Date for PT Re-Evaluation 01/31/24   pushed out due to aquatic needs   Authorization Type [employer] MEDICARE    Progress Note Due on Visit 10    PT Start Time 1400    PT Stop Time 1449    PT Time Calculation (min) 49 min    Equipment Utilized During Treatment Gait belt    Behavior During Therapy WFL for tasks assessed/performed             Past Medical History:  Diagnosis Date   Hyperlipidemia    Hypertension    Lumbar disc disease    Prostate cancer (HCC)    Past Surgical History:  Procedure Laterality Date   LUMBAR DISC SURGERY  2014   LUMBAR DISC SURGERY     right ankle fracture  1978   external pinning   Patient Active Problem List   Diagnosis Date Noted   Multiple rib fractures 08/28/2023   MGUS (monoclonal gammopathy of unknown significance) 06/10/2023   RBC microcytosis 06/10/2023   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   DDD (degenerative disc disease), lumbar 12/21/2022   Prostate cancer (HCC); T1c; GG 1; initial diagnosis 2011 12/05/2022   Chronic hyperglycemia 11/16/2022   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   Contusion of ulnar nerve, initial encounter 11/09/2022   Dyslipidemia, goal LDL below 70 09/23/2022   Elevated PSA, between 10 and less than 20 ng/ml 09/17/2022   Accelerated hypertension 09/17/2022   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   Vitamin D deficiency 09/07/2021    ONSET DATE: symptoms began in 2020; pt had a fall August 23, 2023  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  He states the fall "knocked the wind out" of him and resulted in 4 broken ribs.  He has been following up with his MD about this issue and finally felt able to return to therapy.  He feels his medicines are better managing his symptoms and he has been able to put his cane aside for most things.  He reports that working on fall recovery last episode of care helped him get up in the yard. Pt accompanied by: self (he continues to drive himself)  PERTINENT HISTORY: degenerative disc disease (followed by [facility]) s/p L5-S1 surgery, HLD, HTN, prostate cancer  Patient's symptoms started in 2020 - right flank pain (reported initially as abdominal pain, but pt clarifies this is not a deep cramp/stomach pain, but a muscular soreness especially with movement), core weakness (not able to sit up well), sneezing and coughing was painful as well.  A year later he noted stiffness in his legs after sitting where he has to walk up his legs with his hands.  Now, he reports numbness and soreness in left hamstring area as well as swelling and muscle spasms (left worse).  In order to walk he has to weight shift and bring feet into narrowed BOS then start with small steps.  He loosens up with movement. He reports freezing when startled. Ambulates w/ SPC.   Upper body remains WNL.  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  Pt has a fall August 23, 2023 halting his prior PT episode due to broken ribs.  PAIN:  Are you having pain? No - "just a little itch" in his legs to his low back  PRECAUTIONS: Fall  RED FLAGS: None-pt does report more urinary urgency that he reports corresponds with the reported itch in his legs and low back, but he has discussed this with his physician (Dr. Loleta [name])  WEIGHT BEARING RESTRICTIONS: No  FALLS: Has patient fallen in last 6 months? Yes. 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  He modifies some homemaking tasks, but reports he can stand longer to do things like cooking.  PATIENT GOALS: Work on upper body strength  OBJECTIVE:   DIAGNOSTIC FINDINGS:  EMG on 05/14/23 showed an active left S1 radiculopathy (similar to prior EMG)  X-ray Bone Survey Met (11/06/2023):   IMPRESSION: No evidence of lytic or destructive bone lesion  X-ray Ribs Unilateral Right (09/04/2023): Radiographs of his right ribs demonstrate no more blunting of the  costophrenic angle he does have vascular markings going out to the  periphery.   COGNITION: Overall cognitive status: Within functional limits for tasks assessed and pt hyperverbal   SENSATION: Light touch: WFL  COORDINATION: LE RAMS:  slow and deliberate Bilateral Heel-to-shin:  limited by lack of  bilateral ER  EDEMA:  bilateral ankle edema (baseline per pt - he has been  using castor oil and lavender to improve skin integrity - MD aware of regimen)  MUSCLE TONE: None noted in BLE  POSTURE: weight shift right  LOWER EXTREMITY ROM:     Active  Right Eval Left Eval  Hip flexion Grossly WFL - limited bilateral ER (basically at neutral  Hip extension   Hip abduction   Hip adduction   Hip internal rotation   Hip external rotation   Knee flexion   Knee extension   Ankle dorsiflexion   Ankle plantarflexion    Ankle inversion    Ankle eversion     (Blank rows = not tested)  LOWER EXTREMITY MMT:    MMT Right Eval Left Eval  Hip flexion 3/5 3+/5  Hip extension    Hip abduction    Hip adduction    Hip internal rotation    Hip external rotation    Knee flexion    Knee extension 4+/5 4+/5  Ankle dorsiflexion 3+/5 4/5  Ankle plantarflexion    Ankle inversion    Ankle eversion    (Blank rows = not tested)  BED MOBILITY:  Pt reports his bed mobility has greatly improved and he has switched his setup so he enters and exits bed on side closest to his bathroom  TRANSFERS: Assistive device utilized: None  Sit to stand: Modified independence - uses hands Stand to sit: Modified independence Chair to chair: Complete Independence Floor:  pt performed modI on most recent fall  GAIT: Gait pattern: step through pattern, decreased step length- Right, decreased step length- Left, decreased stride length, decreased hip/knee flexion- Right, decreased hip/knee flexion- Left, lateral lean- Right, decreased trunk rotation, and narrow BOS Distance walked: various clinic distances Assistive device utilized: None Level of assistance: SBA Comments: No notable ataxia or involuntary movements during ambulation into/out of clinic.  FUNCTIONAL TESTS:  5 times sit to stand: 24.72 seconds w/ light BUE support - wide stance with some crouching into tall stand due to imbalance Timed up and  go (TUG): To be assessed. 10 meter walk test: To be assessed.  PATIENT SURVEYS:  ABC scale To be assessed.  TODAY'S TREATMENT:                                                                                                                              DATE: N/A - eval only.   PATIENT EDUCATION: Education details: PT POC, assessments used and to be used, and goals to be set. Person educated: Patient Education method: Explanation Education comprehension: verbalized understanding and needs further education  HOME EXERCISE PROGRAM: Review from episode prior - modify as needed:  Access Code: [email or web address]  You Can Walk For A Certain Length Of Time Each Day (can start with 3-4 days and work up to everyday - use cane for safety and walk in well lit familiar areas with smooth surfaces)  Walk 5 minutes 2 times per day.             Increase 2  minutes every 7 days              Work up to 20 minutes (1x per day).               Example:                         Day 1-2           4-5 minutes     3 times per day                         Day 7-8           10-12 minutes 2-3 times per day                         Day 13-14       20-22 minutes 1-2 times per day  GOALS: Goals reviewed with patient? Yes  SHORT TERM GOALS: Target date: 12/27/2023  Pt will decrease 5xSTS to </=19.72 seconds with improved immediate standing balance in order to demonstrate decreased risk for falls and improved functional bilateral LE strength and power. Baseline:  24.72 seconds w/ light BUE support Goal status: INITIAL  2.  TUG to be assessed w/ STG/LTG set as appropriate. Baseline: To be assessed. Goal status: INITIAL  3.  to be assessed w/ STG/LTG set as appropriate. Baseline: To be assessed. Goal status: INITIAL  LONG TERM GOALS: Target date: 01/24/2024  Pt will be independent and compliant with strength, stretching, and balance focused land-based HEP and aquatic HEP if continuing  following discharge in order to maintain functional progress and improve mobility. Baseline:  To be established. Goal status: INITIAL  2.  Pt will decrease 5xSTS to </=14.72 seconds in order to demonstrate decreased risk for falls and improved functional bilateral LE strength and power. Baseline: 24.72 seconds w/ light BUE support Goal status: INITIAL  3.  TUG to be assessed w/ STG/LTG set as appropriate. Baseline: To be assessed. Goal status: INITIAL  4.  to be assessed w/ STG/LTG set as appropriate. Baseline: To be assessed. Goal status: INITIAL  5.  Pt will ambulate >/=800 feet at IND level over unlevel outdoor surfaces and grass to promote household and community access. Baseline: To be assessed. Goal status: INITIAL  6.  ABC Scale to be assessed w/ LTG set as appropriate. Baseline: To be assessed. Goal status: INITIAL  ASSESSMENT:  CLINICAL IMPRESSION: Patient is a 68 y.o. male who was seen today for physical therapy evaluation and treatment for generalized weakness, stiffness, and gait instability following recent fall with 4 right fractured ribs.  Pt has a significant PMH of degenerative disc disease (followed by [facility]) s/p L5-S1 surgery, HLD, HTN, and prostate cancer.  His prior EMG indicates ongoing S1 radiculopathy confounding current symptoms.  Identified impairments include recent fall w/ injury, impaired coordination, bilateral ankle edema, generalized weakness particularly of the hips, increased time to complete functional tasks, mild right weight shift in sitting and during ambulation, decreased stride length, decreased hip and knee flexion on the left, and narrowed BOS during standing and ambulation.  Evaluation via the following assessment tools: 5xSTS indicate fall risk.  Will assess TUG and at next session in order to further assess deficits  and fall risk.  He has made some functional progress since last episode of care, but has varying and  lingering deficits that would benefit from skilled PT to progress towards long term goals.  OBJECTIVE IMPAIRMENTS: decreased activity tolerance, decreased balance, decreased coordination, decreased endurance, decreased knowledge of condition, decreased mobility, difficulty walking, decreased strength, increased edema, and increased muscle spasms.   ACTIVITY LIMITATIONS: carrying, lifting, bending, sitting, standing, squatting, stairs, transfers, bed mobility, and locomotion level  PARTICIPATION LIMITATIONS: shopping, community activity, and yard work  PERSONAL FACTORS: Age, Fitness, and 1-2 comorbidities: HTN, ongoing S1 radiculopathy  are also affecting patient's functional outcome.   REHAB POTENTIAL: Excellent  CLINICAL DECISION MAKING: Evolving/moderate complexity  EVALUATION COMPLEXITY: Moderate  PLAN:  PT FREQUENCY: 2x/week (1 land and 1 aquatic)  PT DURATION: 8 weeks  PLANNED INTERVENTIONS: 04540- PT Re-evaluation, 97110-Therapeutic exercises, 97530- Therapeutic activity, 97112- Neuromuscular re-education, 97535- Self Care, 98119- Manual therapy, [phone]- Gait training, [phone]- Orthotic Fit/training, [phone]- Aquatic Therapy, Patient/Family education, Balance training, Stair training, Taping, Dry Needling, Joint mobilization, Spinal mobilization, Vestibular training, DME instructions, Moist heat, Therapeutic exercises, Therapeutic activity, Neuromuscular re-education, Gait training, and Self Care  PLAN FOR NEXT SESSION: ASSESS ABC scale, TUG and - set STG/LTGs.  Initiate HEP for core and LE strength, stretching, and balance.  SciFit for reciprocal mobility.  Walking program.  Reaction time.  Inclined shoulder taps.  Upper body strength.  Immediate standing balance - STS into step outs, etc.  Aquatics:  pt may not need aquatic HEP - unsure of transition into community pool - may need a list of community options, balance - ai chi and general strength/stretching of core, shoulders and  hips  Sadie Haber, PT, DPT 11/25/2023, 2:53 PM

## 2023-12-02 ENCOUNTER — Encounter: Payer: Self-pay | Admitting: Physical Therapy

## 2023-12-02 ENCOUNTER — Ambulatory Visit: Payer: Medicare Other | Attending: Physician Assistant | Admitting: Physical Therapy

## 2023-12-02 DIAGNOSIS — Z9181 History of falling: Secondary | ICD-10-CM

## 2023-12-02 DIAGNOSIS — M25672 Stiffness of left ankle, not elsewhere classified: Secondary | ICD-10-CM | POA: Diagnosis not present

## 2023-12-02 DIAGNOSIS — M25675 Stiffness of left foot, not elsewhere classified: Secondary | ICD-10-CM

## 2023-12-02 DIAGNOSIS — M25662 Stiffness of left knee, not elsewhere classified: Secondary | ICD-10-CM

## 2023-12-02 DIAGNOSIS — R2689 Other abnormalities of gait and mobility: Secondary | ICD-10-CM | POA: Diagnosis not present

## 2023-12-02 DIAGNOSIS — R252 Cramp and spasm: Secondary | ICD-10-CM

## 2023-12-02 DIAGNOSIS — M6281 Muscle weakness (generalized): Secondary | ICD-10-CM

## 2023-12-02 DIAGNOSIS — R600 Localized edema: Secondary | ICD-10-CM | POA: Diagnosis not present

## 2023-12-02 DIAGNOSIS — M25652 Stiffness of left hip, not elsewhere classified: Secondary | ICD-10-CM

## 2023-12-02 DIAGNOSIS — R2681 Unsteadiness on feet: Secondary | ICD-10-CM | POA: Insufficient documentation

## 2023-12-02 NOTE — Therapy (Signed)
OUTPATIENT PHYSICAL THERAPY NEURO TREATMENT   Patient Name: Michael [name] MRN: [id number] DOB:1956-10-15, 68 y.o., male Today's Date: 12/02/2023   PCP: [name], MD REFERRING PROVIDER: Persons, [place], [place]  END OF SESSION:  PT End of Session - 12/02/23 1413     Visit Number 2    Number of Visits 17   16 + eval   Date for PT Re-Evaluation 01/31/24   pushed out due to aquatic needs   Authorization Type [employer] MEDICARE    Progress Note Due on Visit 10    PT Start Time 1407    PT Stop Time 1526    PT Time Calculation (min) 79 min    Equipment Utilized During Treatment Gait belt    Activity Tolerance Patient tolerated treatment well    Behavior During Therapy WFL for tasks assessed/performed             Past Medical History:  Diagnosis Date   Hyperlipidemia    Hypertension    Lumbar disc disease    Prostate cancer (HCC)    Past Surgical History:  Procedure Laterality Date   LUMBAR DISC SURGERY  2014   LUMBAR DISC SURGERY     right ankle fracture  1978   external pinning   Patient Active Problem List   Diagnosis Date Noted   Multiple rib fractures 08/28/2023   MGUS (monoclonal gammopathy of unknown significance) 06/10/2023   RBC microcytosis 06/10/2023   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   DDD (degenerative disc disease), lumbar 12/21/2022   Prostate cancer (HCC); T1c; GG 1; initial diagnosis 2011 12/05/2022   Chronic hyperglycemia 11/16/2022   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   Contusion of ulnar nerve, initial encounter 11/09/2022   Dyslipidemia, goal LDL below 70 09/23/2022   Elevated PSA, between 10 and less than 20 ng/ml 09/17/2022   Accelerated hypertension 09/17/2022   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   Vitamin D deficiency 09/07/2021    ONSET DATE: symptoms began in 2020; pt had a fall August 23, 2023  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  He felt as if he had a kidney stone yesterday, but is feeling better today.  He feels pretty stiff today.  He continues to ambulate without AD and denies recent falls.  He states he had to cancel a pool visit that was scheduled on his birthday and he apologizes for this. Pt accompanied by: self (he continues to drive himself)  PERTINENT HISTORY: degenerative disc disease (followed by [facility]) s/p L5-S1 surgery, HLD, HTN, prostate cancer  Patient's symptoms started in 2020 - right flank pain (reported initially as abdominal pain, but pt clarifies this is not a deep cramp/stomach pain, but a muscular soreness especially with movement), core weakness (not able to sit up well), sneezing and coughing was painful as well.  A year later he noted stiffness in his legs after sitting where he has to walk up his legs with his hands.  Now, he reports numbness and soreness in left hamstring area as well as swelling and muscle spasms (left worse).  In order to walk he has to weight shift and bring feet into narrowed BOS then start with small steps.  He loosens up with movement. He reports freezing when startled. Ambulates w/ SPC.  Upper body remains WNL.  Is being followed by hematology for possible thalassemia due to initial labs  being positive for IgG kappa monoclonal antibody on 05/03/2023.  Pt has a fall August 23, 2023 halting his prior PT episode due to broken ribs.  PAIN:  Are you having pain? No   PRECAUTIONS: Fall  RED FLAGS: None-pt does report more urinary urgency that he reports corresponds with the reported itch in his legs and low back, but he has discussed this with his physician (Dr. [name])  WEIGHT BEARING RESTRICTIONS: No  FALLS: Has patient fallen in last 6 months? Yes. 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  He modifies some homemaking tasks, but reports he can stand longer to do things like cooking.  PATIENT GOALS: Work on upper body strength  OBJECTIVE:   DIAGNOSTIC FINDINGS:  EMG on 05/14/23 showed an active left S1 radiculopathy (similar to prior EMG)  X-ray Bone Survey Met (11/06/2023):   IMPRESSION: No evidence of lytic or destructive bone lesion  X-ray Ribs Unilateral Right (09/04/2023): Radiographs of his right ribs demonstrate no more blunting of the  costophrenic angle he does have vascular markings going out to the  periphery.   COGNITION: Overall cognitive status: Within functional limits for tasks assessed and pt hyperverbal   SENSATION: Light touch: WFL  COORDINATION: LE RAMS:  slow and deliberate Bilateral Heel-to-shin:  limited by lack of bilateral ER  EDEMA:  bilateral ankle edema (baseline per pt - he has been using castor oil and lavender to improve skin integrity - MD aware of  regimen)  MUSCLE TONE: None noted in BLE  POSTURE: weight shift right  LOWER EXTREMITY ROM:     Active  Right Eval Left Eval  Hip flexion Grossly WFL - limited bilateral ER (basically at neutral  Hip extension   Hip abduction   Hip adduction   Hip internal rotation   Hip external rotation   Knee flexion   Knee extension   Ankle dorsiflexion   Ankle plantarflexion    Ankle inversion    Ankle eversion     (Blank rows = not tested)  LOWER EXTREMITY MMT:    MMT Right Eval Left Eval  Hip flexion 3/5 3+/5  Hip extension    Hip abduction    Hip adduction    Hip internal rotation    Hip external rotation    Knee flexion    Knee extension 4+/5 4+/5  Ankle dorsiflexion 3+/5 4/5  Ankle plantarflexion    Ankle inversion    Ankle eversion    (Blank rows = not tested)  BED MOBILITY:  Pt reports his bed mobility has greatly improved and he has switched his setup so he enters and exits bed on side closest to his bathroom  TRANSFERS: Assistive device utilized: None  Sit to stand: Modified independence - uses hands Stand to sit: Modified independence Chair to chair: Complete Independence Floor:  pt performed modI on most recent fall  GAIT: Gait pattern: step through pattern, decreased step length- Right, decreased step length- Left, decreased stride length, decreased hip/knee flexion- Right, decreased hip/knee flexion- Left, lateral lean- Right, decreased trunk rotation, and narrow BOS Distance walked: various clinic distances Assistive device utilized: None Level of assistance: SBA Comments: No notable ataxia or involuntary movements during ambulation into/out of clinic.  FUNCTIONAL TESTS:  5 times sit to stand: 24.72 seconds w/ light BUE support - wide stance with some crouching into tall stand due to imbalance Timed up and go (TUG): To be assessed. 10 meter walk test: To be assessed.  PATIENT SURVEYS:  ABC scale To be assessed.  TODAY'S TREATMENT:                                                                                                                               DATE: 12/02/2023   -ABC Scale:  59.4% -TUG:  22.09 sec no AD SBA - :  14.25 sec = 0.70 m/sec OR 2.32 ft/sec  -SciFit using BUE/BLE for management of stiffness and improved reciprocal mobility and large amplitude movements progressing to level 5.0 over 8 minutes.  -Discussed pt hesitancy with declines due to past falls, discussed staggered stance on decline facing sideways and using walking stick of some kind.  Will further demonstrate and practice with pt in future session.  -Discussed and reprinted walking program.  Pt verbalizes understanding and that he will try walking outside as the weather improves.  Reviewed prior HEP (pt has not been doing HEP or walking program since fall):  -Staggered STS x10 each LE in rear  -Seated hamstring stretch 3x20 seconds each LE  -Runner's stretch 3x20 seconds each LE  -Supine figure-4 stretch x30 sec each  LE, pt is severely limited in ROM so reduced depth of stretch to improve quality and comfort  -Supine bridges x12  -Supine LTRs x20 each side, cues to TrA and oblique activation vs relying on LE resulting in mild hamstring cramping  -SKTC 2x1 minute each LE  -Side-lying open book x10 each side w/ PT guiding pt through initial reps for good form  -Standing birddogs alternating laterality x20, cued for improved inclined plank form to engage core  PATIENT EDUCATION: Education details: Outcome interpretations and initial HEP.  Confirmed pt remembers how to access [facility] and pool facility - declined handout.  He remembers how to dress and what to expect.  Resume prior HEP - printed copy for pt.  Work back into regular walking program.  Managing lawn tasks at home safely. Person educated: Patient Education method: Explanation Education comprehension: verbalized understanding and needs further education  HOME EXERCISE PROGRAM: Review from episode  prior - modify as needed:  Access Code: [email or web address]  You Can Walk For A Certain Length Of Time Each Day (can start with 3-4 days and work up to everyday - use cane for safety and walk in well lit familiar areas with smooth surfaces)                          Walk 5 minutes 2 times per day.             Increase 2  minutes every 7 days              Work up to 20 minutes (1x per day).               Example:                         Day 1-2           4-5 minutes     3 times per day                         Day 7-8           10-12 minutes 2-3 times per day                         Day 13-14       20-22 minutes 1-2 times per day  GOALS: Goals reviewed with patient? Yes  SHORT TERM GOALS: Target date: 12/27/2023  Pt will decrease 5xSTS to </=19.72 seconds with improved immediate standing balance in order to demonstrate decreased risk for falls and improved functional bilateral LE strength and power. Baseline:  24.72 seconds w/ light BUE support Goal status: INITIAL  2.  Pt will demonstrate TUG of </=17.09 seconds in order to decrease risk of falls and improve functional mobility using LRAD. Baseline: 22.09 sec no AD SBA (2/3) Goal status: INITIAL  3.  Pt will demonstrate a gait speed of >/=2.52 feet/sec in order to decrease risk for falls. Baseline: 2.32 ft/sec (2/3) Goal status: INITIAL  LONG TERM GOALS: Target date: 01/24/2024  Pt will be independent and compliant with strength, stretching, and balance focused land-based HEP and aquatic HEP if continuing following discharge in order to maintain functional progress and improve mobility. Baseline:  To be established. Goal status: INITIAL  2.  Pt will decrease 5xSTS to </=14.72 seconds in order to demonstrate decreased risk for falls and improved functional bilateral LE  strength and power. Baseline: 24.72 seconds w/ light BUE support Goal status: INITIAL  3.  Pt will demonstrate TUG of </=12.09 seconds in order to decrease risk of falls and  improve functional mobility using LRAD. Baseline: 22.09 sec no AD SBA (2/3) Goal status: INITIAL  4.  Pt will demonstrate a gait speed of >/=2.72 feet/sec in order to decrease risk for falls. Baseline: 2.32 ft/sec (2/3) Goal status: INITIAL  5.  Pt will ambulate >/=800 feet at IND level over unlevel outdoor surfaces and grass to promote household and community access. Baseline: To be assessed. Goal status: INITIAL  6.  Pt will improve ABC Scale to >/= 69.4% in order to demonstrate improved fall risk and engagement in daily activities. Baseline: 59.4% (2/3) Goal status: INITIAL  ASSESSMENT:  CLINICAL IMPRESSION: PT providing frequent redirection to task this visit to maintain focus of session and progression from assessment into treatment.  Patient agreeable to longer session today to cover assessments not captured on eval as well as review of prior HEP to assess appropriateness of continuing.  He reports his bed is too compliant for proper hip flexor stretch so PT to assign different task to capture this need next session and removed old stretch from HEP.  Other components of HEP remain appropriate and challenging.  He finds relief in flexion patterns today.  He continues to exhibit stiffness that benefits from further work on land and likely aquatic setting.  Will continue per POC.  OBJECTIVE IMPAIRMENTS: decreased activity tolerance, decreased balance, decreased coordination, decreased endurance, decreased knowledge of condition, decreased mobility, difficulty walking, decreased strength, increased edema, and increased muscle spasms.   ACTIVITY LIMITATIONS: carrying, lifting, bending, sitting, standing, squatting, stairs, transfers, bed mobility, and locomotion level  PARTICIPATION LIMITATIONS: shopping, community activity, and yard work  PERSONAL FACTORS: Age, Fitness, and 1-2 comorbidities: HTN, ongoing S1 radiculopathy  are also affecting patient's functional outcome.   REHAB  POTENTIAL: Excellent  CLINICAL DECISION MAKING: Evolving/moderate complexity  EVALUATION COMPLEXITY: Moderate  PLAN:  PT FREQUENCY: 2x/week (1 land and 1 aquatic)  PT DURATION: 8 weeks  PLANNED INTERVENTIONS: 40981- PT Re-evaluation, 97110-Therapeutic exercises, 97530- Therapeutic activity, 97112- Neuromuscular re-education, 97535- Self Care, [id number]- Manual therapy, [phone]- Gait training, [phone]- Orthotic Fit/training, [phone]- Aquatic Therapy, Patient/Family education, Balance training, Stair training, Taping, Dry Needling, Joint mobilization, Spinal mobilization, Vestibular training, DME instructions, Moist heat, Therapeutic exercises, Therapeutic activity, Neuromuscular re-education, Gait training, and Self Care  PLAN FOR NEXT SESSION: Modify land HEP for core and LE strength, stretching, and balance.  SciFit for reciprocal mobility.  Walking program.  Reaction time.  Inclined shoulder taps.  Upper body strength.  Immediate standing balance - STS into step outs, etc.  Hip flexor strength.  Treadmill vs elliptical training?  Standing on incline board for static stretch and balance.  Walking stick on outdoor incline - staggered walking down decline - print resource for pt.  Standing hip flexor stretch.  Aquatics:  pt may not need aquatic HEP - unsure of transition into community pool - may need a list of community options, balance - ai chi and general strength/stretching of core, shoulders and hips  Sadie Haber, PT, DPT 12/02/2023, 3:49 PM

## 2023-12-09 ENCOUNTER — Encounter: Payer: Self-pay | Admitting: Physical Therapy

## 2023-12-09 ENCOUNTER — Ambulatory Visit: Payer: Medicare Other | Admitting: Physical Therapy

## 2023-12-09 DIAGNOSIS — R600 Localized edema: Secondary | ICD-10-CM | POA: Diagnosis not present

## 2023-12-09 DIAGNOSIS — M25652 Stiffness of left hip, not elsewhere classified: Secondary | ICD-10-CM

## 2023-12-09 DIAGNOSIS — R2689 Other abnormalities of gait and mobility: Secondary | ICD-10-CM

## 2023-12-09 DIAGNOSIS — R252 Cramp and spasm: Secondary | ICD-10-CM

## 2023-12-09 DIAGNOSIS — M25672 Stiffness of left ankle, not elsewhere classified: Secondary | ICD-10-CM

## 2023-12-09 DIAGNOSIS — M25662 Stiffness of left knee, not elsewhere classified: Secondary | ICD-10-CM

## 2023-12-09 DIAGNOSIS — M25675 Stiffness of left foot, not elsewhere classified: Secondary | ICD-10-CM

## 2023-12-09 DIAGNOSIS — R2681 Unsteadiness on feet: Secondary | ICD-10-CM

## 2023-12-09 DIAGNOSIS — Z9181 History of falling: Secondary | ICD-10-CM | POA: Diagnosis not present

## 2023-12-09 DIAGNOSIS — M6281 Muscle weakness (generalized): Secondary | ICD-10-CM

## 2023-12-09 NOTE — Therapy (Signed)
 OUTPATIENT PHYSICAL THERAPY NEURO TREATMENT   Patient Name: Michael [name] MRN: [id number] DOB:1956-01-13, 68 y.o., male Today's Date: 12/09/2023   PCP: Arcadio Knuckles, MD REFERRING PROVIDER: Persons, Norma Beckers, [place]  END OF SESSION:  PT End of Session - 12/09/23 1409     Visit Number 3    Number of Visits 17   16 + eval   Date for PT Re-Evaluation 01/31/24   pushed out due to aquatic needs   Authorization Type [employer] MEDICARE    Progress Note Due on Visit 10    PT Start Time 1404    PT Stop Time 1459    PT Time Calculation (min) 55 min    Equipment Utilized During Treatment Gait belt    Activity Tolerance Patient tolerated treatment well    Behavior During Therapy WFL for tasks assessed/performed             Past Medical History:  Diagnosis Date   Hyperlipidemia    Hypertension    Lumbar disc disease    Prostate cancer (HCC)    Past Surgical History:  Procedure Laterality Date   LUMBAR DISC SURGERY  2014   LUMBAR DISC SURGERY     right ankle fracture  1978   external pinning   Patient Active Problem List   Diagnosis Date Noted   Multiple rib fractures 08/28/2023   MGUS (monoclonal gammopathy of unknown significance) 06/10/2023   RBC microcytosis 06/10/2023   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   DDD (degenerative disc disease), lumbar 12/21/2022   Prostate cancer (HCC); T1c; GG 1; initial diagnosis 2011 12/05/2022   Chronic hyperglycemia 11/16/2022   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   Contusion of ulnar nerve, initial encounter 11/09/2022   Dyslipidemia, goal LDL below 70 09/23/2022   Elevated PSA, between 10 and less than 20 ng/ml 09/17/2022   Accelerated hypertension 09/17/2022   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   Vitamin D  deficiency 09/07/2021    ONSET DATE: symptoms began in 2020; pt had a fall August 23, 2023  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  THERAPY DIAG:  Stiffness of left knee, not elsewhere  classified  Stiffness of left hip, not elsewhere classified  Stiffness of left foot, not elsewhere classified  Stiffness of left ankle, not elsewhere classified  Other abnormalities of gait and mobility  Localized edema  History of falling  Muscle weakness (generalized)  Cramp and spasm  Unsteadiness on feet  Rationale for Evaluation and Treatment: Rehabilitation  SUBJECTIVE:  SUBJECTIVE STATEMENT: He feels pretty stiff today and he has been experimenting with different nutrition and fasting to see if the inflammatory response from these things is impacting his stiffness.  He continues to ambulate without AD and denies recent falls.  Pt reports feeling he is "crooked" in his trunk and bent to the right. Pt accompanied by: self (he continues to drive himself)  PERTINENT HISTORY: degenerative disc disease (followed by [facility]) s/p L5-S1 surgery, HLD, HTN, prostate cancer  Patient's symptoms started in 2020 - right flank pain (reported initially as abdominal pain, but pt clarifies this is not a deep cramp/stomach pain, but a muscular soreness especially with movement), core weakness (not able to sit up well), sneezing and coughing was painful as well.  A year later he noted stiffness in his legs after sitting where he has to walk up his legs with his hands.  Now, he reports numbness and soreness in left hamstring area as well as swelling and muscle spasms (left worse).  In order to walk he has to weight shift and bring feet into narrowed BOS then start with small steps.  He loosens up with movement. He reports freezing when startled. Ambulates w/ SPC.  Upper body remains WNL.  Is being followed by hematology for possible thalassemia due to initial labs being positive for IgG kappa  monoclonal antibody on 05/03/2023.  Pt has a fall August 23, 2023 halting his prior PT episode due to broken ribs.  PAIN:  Are you having pain? No   PRECAUTIONS: Fall  RED FLAGS: None-pt does report more urinary urgency that he reports corresponds with the reported itch in his legs and low back, but he has discussed this with his physician (Dr. Genita Keys)  WEIGHT BEARING RESTRICTIONS: No  FALLS: Has patient fallen in last 6 months? Yes. 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  He modifies some homemaking tasks, but reports he can stand longer to do things like cooking.  PATIENT GOALS: Work on upper body strength  OBJECTIVE:   DIAGNOSTIC FINDINGS:  EMG on 05/14/23 showed an active left S1 radiculopathy (similar to prior EMG)  X-ray Bone Survey Met (11/06/2023):   IMPRESSION: No evidence of lytic or destructive bone lesion  X-ray Ribs Unilateral Right (09/04/2023): Radiographs of his right ribs demonstrate no more blunting of the  costophrenic angle he does have vascular markings going out to the  periphery.   COGNITION: Overall cognitive status: Within functional limits for tasks assessed and pt hyperverbal   SENSATION: Light touch: WFL  COORDINATION: LE RAMS:  slow and deliberate Bilateral Heel-to-shin:  limited by lack of bilateral ER  EDEMA:  bilateral ankle edema (baseline per pt - he has been using castor oil and lavender to improve skin integrity - MD aware of regimen)  MUSCLE TONE: None noted in  BLE  POSTURE: weight shift right  LOWER EXTREMITY ROM:     Active  Right Eval Left Eval  Hip flexion Grossly WFL - limited bilateral ER (basically at neutral  Hip extension  Hip abduction   Hip adduction   Hip internal rotation   Hip external rotation   Knee flexion   Knee extension   Ankle dorsiflexion   Ankle plantarflexion    Ankle inversion    Ankle eversion     (Blank rows = not tested)  LOWER EXTREMITY MMT:    MMT Right Eval Left Eval  Hip flexion 3/5 3+/5  Hip extension    Hip abduction    Hip adduction    Hip internal rotation    Hip external rotation    Knee flexion    Knee extension 4+/5 4+/5  Ankle dorsiflexion 3+/5 4/5  Ankle plantarflexion    Ankle inversion    Ankle eversion    (Blank rows = not tested)  BED MOBILITY:  Pt reports his bed mobility has greatly improved and he has switched his setup so he enters and exits bed on side closest to his bathroom  TRANSFERS: Assistive device utilized: None  Sit to stand: Modified independence - uses hands Stand to sit: Modified independence Chair to chair: Complete Independence Floor:  pt performed modI on most recent fall  GAIT: Gait pattern: step through pattern, decreased step length- Right, decreased step length- Left, decreased stride length, decreased hip/knee flexion- Right, decreased hip/knee flexion- Left, lateral lean- Right, decreased trunk rotation, and narrow BOS Distance walked: various clinic distances Assistive device utilized: None Level of assistance: SBA Comments: No notable ataxia or involuntary movements during ambulation into/out of clinic.  FUNCTIONAL TESTS:  5 times sit to stand: 24.72 seconds w/ light BUE support - wide stance with some crouching into tall stand due to imbalance Timed up and go (TUG): To be assessed. 10 meter walk test: To be assessed.  PATIENT SURVEYS:  ABC scale To be assessed.  TODAY'S TREATMENT:                                                                                                                               DATE: 12/09/2023  -Discussed pool schedule and if pt would like to switch any appts to primary therapist schedule:  he wants to keep his schedule as is due to traffic and feeling afternoons would be better for him and create him from feeling an "increased nervous response" and having more emotional responses.  Tried to make up 2/19 pool appt on primary - no availability at current.  Informed him we will address making this visit up as needed when reassessment comes at end of current POC and scheduled visits.  Will continue with schedule as written. -Assessed trunk posture: R shoulder higher, convex curve on right, pt is tender when stretching out of position in right trunk - PT feels he is guarding and this is a flexible posture -NuStep x8 minutes progressing to level 7.0 maintaining 85 steps per minute pace using BUE/BLE for global strength, reciprocal mobility, speed, and endurance. -Chair yoga poses 1-6, 8-9 (modified position 9 and held x10  seconds), 12  -Significant thoracic stiffness in cervicothoracic region noted with cat cow and high altar leans; cued for paced breathing and slowed mobility to increase depth of stretch; x10 each pose each direction, no significant holds this visit to allow pt to acclimate to tasks without increased pain response, discussed modifications to leg crossing and sun salutations to improve comfort but work into stretch and improved ROM  PATIENT EDUCATION: Education details: Continue prior HEP.  Work back into regular walking program.  Chair yoga to supplement other higher level activity and help correct muscular imbalance. Person educated: Patient Education method: Explanation Education comprehension: verbalized understanding and needs further education  HOME EXERCISE PROGRAM: Review from episode prior - modify as needed:  Access Code: [email or web address]  You Can Walk For A Certain Length Of Time Each Day  (can start with 3-4 days and work up to everyday - use cane for safety and walk in well lit familiar areas with smooth surfaces)                          Walk 5 minutes 2 times per day.             Increase 2  minutes every 7 days              Work up to 20 minutes (1x per day).               Example:                         Day 1-2           4-5 minutes     3 times per day                         Day 7-8           10-12 minutes 2-3 times per day                         Day 13-14       20-22 minutes 1-2 times per day  Chair yoga poses 1-6, 8-9 (modified position 9 and held x10 seconds), 12  GOALS: Goals reviewed with patient? Yes  SHORT TERM GOALS: Target date: 12/27/2023  Pt will decrease 5xSTS to </=19.72 seconds with improved immediate standing balance in order to demonstrate decreased risk for falls and improved functional bilateral LE strength and power. Baseline:  24.72 seconds w/ light BUE support Goal status: INITIAL  2.  Pt will demonstrate TUG of </=17.09 seconds in order to decrease risk of falls and improve functional mobility using LRAD. Baseline: 22.09 sec no AD SBA (2/3) Goal status: INITIAL  3.  Pt will demonstrate a gait speed of >/=2.52 feet/sec in order to decrease risk for falls. Baseline: 2.32 ft/sec (2/3) Goal status: INITIAL  LONG TERM GOALS: Target date: 01/24/2024  Pt will be independent and compliant with strength, stretching, and balance focused land-based HEP and aquatic HEP if continuing following discharge in order to maintain functional progress and improve mobility. Baseline:  To be established. Goal status: INITIAL  2.  Pt will decrease 5xSTS to </=14.72 seconds in order to demonstrate decreased risk for falls and improved functional bilateral LE strength and power. Baseline: 24.72 seconds w/ light BUE support Goal status: INITIAL  3.  Pt will demonstrate TUG of </=12.09 seconds in order to  decrease risk of falls and improve functional mobility  using LRAD. Baseline: 22.09 sec no AD SBA (2/3) Goal status: INITIAL  4.  Pt will demonstrate a gait speed of >/=2.72 feet/sec in order to decrease risk for falls. Baseline: 2.32 ft/sec (2/3) Goal status: INITIAL  5.  Pt will ambulate >/=800 feet at IND level over unlevel outdoor surfaces and grass to promote household and community access. Baseline: To be assessed. Goal status: INITIAL  6.  Pt will improve ABC Scale to >/= 69.4% in order to demonstrate improved fall risk and engagement in daily activities. Baseline: 59.4% (2/3) Goal status: INITIAL  ASSESSMENT:  CLINICAL IMPRESSION: Patient chose to maintain pool schedule as is today due to concerns for sympathetic response to driving during busy traffic hours.  He is aware that he can change this at any time if primary therapist has an available slot to switch.  He has some postural change in his trunk today likely reflective of a flexible protective deformity.  He does well with chair yoga but demonstrates significant thoracic mobility limitations.  Will continue per POC to address ongoing deficits and improve functional mobility.  OBJECTIVE IMPAIRMENTS: decreased activity tolerance, decreased balance, decreased coordination, decreased endurance, decreased knowledge of condition, decreased mobility, difficulty walking, decreased strength, increased edema, and increased muscle spasms.   ACTIVITY LIMITATIONS: carrying, lifting, bending, sitting, standing, squatting, stairs, transfers, bed mobility, and locomotion level  PARTICIPATION LIMITATIONS: shopping, community activity, and yard work  PERSONAL FACTORS: Age, Fitness, and 1-2 comorbidities: HTN, ongoing S1 radiculopathy  are also affecting patient's functional outcome.   REHAB POTENTIAL: Excellent  CLINICAL DECISION MAKING: Evolving/moderate complexity  EVALUATION COMPLEXITY: Moderate  PLAN:  PT FREQUENCY: 2x/week (1 land and 1 aquatic)  PT DURATION: 8 weeks  PLANNED  INTERVENTIONS: 19147- PT Re-evaluation, 97110-Therapeutic exercises, 97530- Therapeutic activity, 97112- Neuromuscular re-education, 97535- Self Care, [id number]- Manual therapy, [phone]- Gait training, [phone]- Orthotic Fit/training, [phone]- Aquatic Therapy, Patient/Family education, Balance training, Stair training, Taping, Dry Needling, Joint mobilization, Spinal mobilization, Vestibular training, DME instructions, Moist heat, Therapeutic exercises, Therapeutic activity, Neuromuscular re-education, Gait training, and Self Care  PLAN FOR NEXT SESSION: Modify land HEP for core and LE strength, stretching, and balance.  SciFit for reciprocal mobility.  Reaction time - blaze pods, boxing combos, balloon taps on compliant surfaces, timed puzzles on compliant surface.  Inclined shoulder taps.  Upper body strength.  Immediate standing balance - STS into step outs, etc.  Hip flexor strength.  Treadmill vs elliptical training?  Standing on incline board for static stretch and balance.  Walking stick on outdoor incline - staggered walking down decline - print resource for pt.  Standing hip flexor stretch.  Pt likes bowling.  Thoracic mobility!  Aquatics:  pt may not need aquatic HEP - unsure of transition into community pool - may need a list of community options (land PT can provide copy at next appt if needed), balance - ai chi and general strength/stretching of core, shoulders and hips, upright posture, walking tolerance; PT anticipates pt should be able to manage stairs at no more than supervision level  Earlean Glaze, PT, DPT 12/09/2023, 3:20 PM

## 2023-12-11 ENCOUNTER — Encounter (HOSPITAL_BASED_OUTPATIENT_CLINIC_OR_DEPARTMENT_OTHER): Payer: Self-pay | Admitting: Physical Therapy

## 2023-12-11 ENCOUNTER — Ambulatory Visit (HOSPITAL_BASED_OUTPATIENT_CLINIC_OR_DEPARTMENT_OTHER): Payer: Medicare Other | Attending: Physician Assistant | Admitting: Physical Therapy

## 2023-12-11 DIAGNOSIS — R2689 Other abnormalities of gait and mobility: Secondary | ICD-10-CM | POA: Diagnosis not present

## 2023-12-11 DIAGNOSIS — M25652 Stiffness of left hip, not elsewhere classified: Secondary | ICD-10-CM

## 2023-12-11 DIAGNOSIS — M25662 Stiffness of left knee, not elsewhere classified: Secondary | ICD-10-CM | POA: Diagnosis not present

## 2023-12-11 DIAGNOSIS — R2681 Unsteadiness on feet: Secondary | ICD-10-CM | POA: Diagnosis not present

## 2023-12-11 NOTE — Therapy (Signed)
OUTPATIENT PHYSICAL THERAPY NEURO TREATMENT   Patient Name: Michael [name] MRN: [id number] DOB:1956-05-09, 68 y.o., male Today's Date: 12/11/2023   PCP: [name], MD REFERRING PROVIDER: Persons, [place], [place]  END OF SESSION:  PT End of Session - 12/11/23 1325     Visit Number 4    Number of Visits 17   16 + eval   Date for PT Re-Evaluation 01/31/24   pushed out due to aquatic needs   Authorization Type [employer] MEDICARE    Progress Note Due on Visit 10    PT Start Time 1315    PT Stop Time 1400    PT Time Calculation (min) 45 min    Equipment Utilized During Treatment Gait belt    Activity Tolerance Patient tolerated treatment well    Behavior During Therapy WFL for tasks assessed/performed             Past Medical History:  Diagnosis Date   Hyperlipidemia    Hypertension    Lumbar disc disease    Prostate cancer (HCC)    Past Surgical History:  Procedure Laterality Date   LUMBAR DISC SURGERY  2014   LUMBAR DISC SURGERY     right ankle fracture  1978   external pinning   Patient Active Problem List   Diagnosis Date Noted   Multiple rib fractures 08/28/2023   MGUS (monoclonal gammopathy of unknown significance) 06/10/2023   RBC microcytosis 06/10/2023   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   DDD (degenerative disc disease), lumbar 12/21/2022   Prostate cancer (HCC); T1c; GG 1; initial diagnosis 2011 12/05/2022   Chronic hyperglycemia 11/16/2022   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   Contusion of ulnar nerve, initial encounter 11/09/2022   Dyslipidemia, goal LDL below 70 09/23/2022   Elevated PSA, between 10 and less than 20 ng/ml 09/17/2022   Accelerated hypertension 09/17/2022   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   Vitamin D deficiency 09/07/2021    ONSET DATE: symptoms began in 2020; pt had a fall August 23, 2023  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   Pt accompanied by: self (he continues to drive himself)  PERTINENT HISTORY: degenerative disc disease (followed by [facility]) s/p L5-S1 surgery, HLD, HTN, prostate cancer  Patient's symptoms started in 2020 - right flank pain (reported initially as abdominal pain, but pt clarifies this is not a deep cramp/stomach pain, but a muscular soreness especially with movement), core weakness (not able to sit up well), sneezing and coughing was painful as well.  A year later he noted stiffness in his legs after sitting where he has to walk up his legs with his hands.  Now, he reports numbness and soreness in left hamstring area as well as swelling and muscle spasms (left worse).  In order to walk he has to weight shift and bring feet into narrowed BOS then start with small steps.  He loosens up with movement. He reports freezing when startled. Ambulates w/ SPC.  Upper body remains WNL.  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  Pt has a fall August 23, 2023 halting his prior PT episode due to broken ribs.  PAIN:  Are you having pain? No   PRECAUTIONS: Fall  RED FLAGS: None-pt does report more urinary urgency that he reports corresponds with the reported itch in his legs and low back, but he has discussed this with his physician (Dr. [name])  WEIGHT BEARING RESTRICTIONS:  No  FALLS: Has patient fallen in last 6 months? Yes. 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  He modifies some homemaking tasks, but reports he can stand longer to do things like cooking.  PATIENT GOALS: Work on upper body strength  OBJECTIVE:   DIAGNOSTIC FINDINGS:  EMG on 05/14/23 showed an active left S1 radiculopathy (similar to prior EMG)  X-ray Bone Survey Met (11/06/2023):   IMPRESSION: No evidence of lytic or destructive bone lesion  X-ray Ribs Unilateral Right (09/04/2023): Radiographs of his right ribs demonstrate no more blunting of the  costophrenic angle he does have vascular markings going out to the  periphery.   COGNITION: Overall cognitive status: Within functional limits for tasks assessed and pt hyperverbal   SENSATION: Light touch: WFL  COORDINATION: LE RAMS:  slow and deliberate Bilateral Heel-to-shin:  limited by lack of bilateral ER  EDEMA:  bilateral ankle edema (baseline per pt - he has been using castor oil and lavender to improve skin integrity - MD aware of regimen)  MUSCLE TONE: None noted in BLE  POSTURE: weight shift right  LOWER EXTREMITY ROM:     Active  Right Eval Left Eval  Hip flexion Grossly WFL - limited bilateral ER (basically at neutral  Hip extension   Hip abduction   Hip adduction   Hip internal rotation   Hip external rotation   Knee flexion   Knee extension   Ankle dorsiflexion   Ankle plantarflexion    Ankle inversion    Ankle eversion      (  Blank rows = not tested)  LOWER EXTREMITY MMT:    MMT Right Eval Left Eval  Hip flexion 3/5 3+/5  Hip extension    Hip abduction    Hip adduction    Hip internal rotation    Hip external rotation    Knee flexion    Knee extension 4+/5 4+/5  Ankle dorsiflexion 3+/5 4/5  Ankle plantarflexion    Ankle inversion    Ankle eversion    (Blank rows = not tested)  BED MOBILITY:  Pt reports his bed mobility has greatly improved and he has switched his setup so he enters and exits bed on side closest to his bathroom  TRANSFERS: Assistive device utilized: None  Sit to stand: Modified independence - uses hands Stand to sit: Modified independence Chair to chair: Complete Independence Floor:  pt performed modI on most recent fall  GAIT: Gait pattern: step through pattern, decreased step length- Right, decreased step length- Left, decreased stride length, decreased hip/knee flexion- Right, decreased hip/knee flexion- Left, lateral lean- Right, decreased trunk rotation, and narrow BOS Distance walked: various clinic distances Assistive device utilized: None Level of assistance: SBA Comments: No notable ataxia or involuntary movements during ambulation into/out of clinic.  FUNCTIONAL TESTS:  5 times sit to stand: 24.72 seconds w/ light BUE support - wide stance with some crouching into tall stand due to imbalance Timed up and go (TUG): To be assessed. 10 meter walk test: To be assessed.  PATIENT SURVEYS:  ABC scale To be assessed.  TODAY'S TREATMENT:                                                                                                                              DATE: 12/11/23  Pt seen for aquatic therapy today.  Treatment took place in water 3.5-4.75 ft in depth at the [facility] pool. Temp of water was 91.  Pt entered/exited the pool via stairs with hand rail.  *walking forward, back and side stepping unsupported *Farmers carry using yellow HB forward and  back x 2; side x 2 *side lunge with ue add/abd x 4 *L stretch *3 way stretch using solid noodle: hamstring, gastroc, add,IT band 15-20s hold each position, each leg x 2 *quad/hip flex stretch using solid noodle *sitting balance straddling noodle (good challenge)  -cycling (difficultly with coordinated movements of le and ue  Pt requires the buoyancy and hydrostatic pressure of water for support, and to offload joints by unweighting joint load by at least 50 % in navel deep water and by at least 75-80% in chest to neck deep water.  Viscosity of the water is needed for resistance of strengthening. Water current perturbations provides challenge to standing balance requiring increased core activation.        12/09/2023  -Discussed pool schedule and if pt would like to switch any appts to primary therapist schedule:  he wants to keep his schedule as is due to traffic and feeling  afternoons would be better for him and create him from feeling an "increased nervous response" and having more emotional responses.  Tried to make up 2/19 pool appt on primary - no availability at current.  Informed him we will address making this visit up as needed when reassessment comes at end of current POC and scheduled visits.  Will continue with schedule as written. -Assessed trunk posture: R shoulder higher, convex curve on right, pt is tender when stretching out of position in right trunk - PT feels he is guarding and this is a flexible posture -NuStep x8 minutes progressing to level 7.0 maintaining 85 steps per minute pace using BUE/BLE for global strength, reciprocal mobility, speed, and endurance. -Chair yoga poses 1-6, 8-9 (modified position 9 and held x10 seconds), 12  -Significant thoracic stiffness in cervicothoracic region noted with cat cow and high altar leans; cued for paced breathing and slowed mobility to increase depth of stretch; x10 each pose each direction, no significant holds this visit to allow pt  to acclimate to tasks without increased pain response, discussed modifications to leg crossing and sun salutations to improve comfort but work into stretch and improved ROM  PATIENT EDUCATION: Education details: Continue prior HEP.  Work back into regular walking program.  Chair yoga to supplement other higher level activity and help correct muscular imbalance. Person educated: Patient Education method: Explanation Education comprehension: verbalized understanding and needs further education  HOME EXERCISE PROGRAM: Review from episode prior - modify as needed:  Access Code: [email or web address]  You Can Walk For A Certain Length Of Time Each Day (can start with 3-4 days and work up to everyday - use cane for safety and walk in well lit familiar areas with smooth surfaces)                          Walk 5 minutes 2 times per day.             Increase 2  minutes every 7 days              Work up to 20 minutes (1x per day).               Example:                         Day 1-2           4-5 minutes     3 times per day                         Day 7-8           10-12 minutes 2-3 times per day                         Day 13-14       20-22 minutes 1-2 times per day  Chair yoga poses 1-6, 8-9 (modified position 9 and held x10 seconds), 12  GOALS: Goals reviewed with patient? Yes  SHORT TERM GOALS: Target date: 12/27/2023  Pt will decrease 5xSTS to </=19.72 seconds with improved immediate standing balance in order to demonstrate decreased risk for falls and improved functional bilateral LE strength and power. Baseline:  24.72 seconds w/ light BUE support Goal status: INITIAL  2.  Pt will demonstrate TUG of </=17.09 seconds in order to decrease risk of falls and improve functional mobility using  LRAD. Baseline: 22.09 sec no AD SBA (2/3) Goal status: INITIAL  3.  Pt will demonstrate a gait speed of >/=2.52 feet/sec in order to decrease risk for falls. Baseline: 2.32 ft/sec (2/3) Goal status:  INITIAL  LONG TERM GOALS: Target date: 01/24/2024  Pt will be independent and compliant with strength, stretching, and balance focused land-based HEP and aquatic HEP if continuing following discharge in order to maintain functional progress and improve mobility. Baseline:  To be established. Goal status: INITIAL  2.  Pt will decrease 5xSTS to </=14.72 seconds in order to demonstrate decreased risk for falls and improved functional bilateral LE strength and power. Baseline: 24.72 seconds w/ light BUE support Goal status: INITIAL  3.  Pt will demonstrate TUG of </=12.09 seconds in order to decrease risk of falls and improve functional mobility using LRAD. Baseline: 22.09 sec no AD SBA (2/3) Goal status: INITIAL  4.  Pt will demonstrate a gait speed of >/=2.72 feet/sec in order to decrease risk for falls. Baseline: 2.32 ft/sec (2/3) Goal status: INITIAL  5.  Pt will ambulate >/=800 feet at IND level over unlevel outdoor surfaces and grass to promote household and community access. Baseline: To be assessed. Goal status: INITIAL  6.  Pt will improve ABC Scale to >/= 69.4% in order to demonstrate improved fall risk and engagement in daily activities. Baseline: 59.4% (2/3) Goal status: INITIAL  ASSESSMENT:  CLINICAL IMPRESSION: Pt demonstrates safety and indep in setting with therapist instructing from deck.  He is familiar with setting and confident with movement through all depths and positions.  Focused on stretching with good toleration.  Required extra time to gain motor plan with sitting balance and cycling on noodle.  He improves over time, gains good hip and knee unloaded movement and enjoys exercise.  Post session pt with improved upright posture and longer step length with gait. Will continue per POC to address ongoing deficits and improve functional mobility. Consider Bow&arrow next session for thoracic mobility    OBJECTIVE IMPAIRMENTS: decreased activity tolerance, decreased  balance, decreased coordination, decreased endurance, decreased knowledge of condition, decreased mobility, difficulty walking, decreased strength, increased edema, and increased muscle spasms.   ACTIVITY LIMITATIONS: carrying, lifting, bending, sitting, standing, squatting, stairs, transfers, bed mobility, and locomotion level  PARTICIPATION LIMITATIONS: shopping, community activity, and yard work  PERSONAL FACTORS: Age, Fitness, and 1-2 comorbidities: HTN, ongoing S1 radiculopathy  are also affecting patient's functional outcome.   REHAB POTENTIAL: Excellent  CLINICAL DECISION MAKING: Evolving/moderate complexity  EVALUATION COMPLEXITY: Moderate  PLAN:  PT FREQUENCY: 2x/week (1 land and 1 aquatic)  PT DURATION: 8 weeks  PLANNED INTERVENTIONS: 54098- PT Re-evaluation, 97110-Therapeutic exercises, 97530- Therapeutic activity, 97112- Neuromuscular re-education, 97535- Self Care, 11914- Manual therapy, [phone]- Gait training, [phone]- Orthotic Fit/training, [phone]- Aquatic Therapy, Patient/Family education, Balance training, Stair training, Taping, Dry Needling, Joint mobilization, Spinal mobilization, Vestibular training, DME instructions, Moist heat, Therapeutic exercises, Therapeutic activity, Neuromuscular re-education, Gait training, and Self Care  PLAN FOR NEXT SESSION: Modify land HEP for core and LE strength, stretching, and balance.  SciFit for reciprocal mobility.  Reaction time - blaze pods, boxing combos, balloon taps on compliant surfaces, timed puzzles on compliant surface.  Inclined shoulder taps.  Upper body strength.  Immediate standing balance - STS into step outs, etc.  Hip flexor strength.  Treadmill vs elliptical training?  Standing on incline board for static stretch and balance.  Walking stick on outdoor incline - staggered walking down decline - print resource for pt.  Standing  hip flexor stretch.  Pt likes bowling.  Thoracic mobility!  Aquatics:  pt may not need aquatic HEP  - unsure of transition into community pool - may need a list of community options (land PT can provide copy at next appt if needed), balance - ai chi and general strength/stretching of core, shoulders and hips, upright posture, walking tolerance; PT anticipates pt should be able to manage stairs at no more than supervision level  Corrie Dandy Tomma Lightning) Zane Pellecchia MPT 12/11/23 1:28 PM [facility] [address] [place], [place], [address] Phone: [phone]   Fax:  [phone]

## 2023-12-16 ENCOUNTER — Ambulatory Visit: Payer: Medicare Other | Admitting: Physical Therapy

## 2023-12-16 ENCOUNTER — Encounter: Payer: Self-pay | Admitting: Physical Therapy

## 2023-12-16 DIAGNOSIS — R252 Cramp and spasm: Secondary | ICD-10-CM | POA: Diagnosis not present

## 2023-12-16 DIAGNOSIS — Z9181 History of falling: Secondary | ICD-10-CM | POA: Diagnosis not present

## 2023-12-16 DIAGNOSIS — M6281 Muscle weakness (generalized): Secondary | ICD-10-CM

## 2023-12-16 DIAGNOSIS — M25672 Stiffness of left ankle, not elsewhere classified: Secondary | ICD-10-CM | POA: Diagnosis not present

## 2023-12-16 DIAGNOSIS — M25652 Stiffness of left hip, not elsewhere classified: Secondary | ICD-10-CM

## 2023-12-16 DIAGNOSIS — R2689 Other abnormalities of gait and mobility: Secondary | ICD-10-CM | POA: Diagnosis not present

## 2023-12-16 DIAGNOSIS — R600 Localized edema: Secondary | ICD-10-CM

## 2023-12-16 DIAGNOSIS — M25675 Stiffness of left foot, not elsewhere classified: Secondary | ICD-10-CM | POA: Diagnosis not present

## 2023-12-16 DIAGNOSIS — M25662 Stiffness of left knee, not elsewhere classified: Secondary | ICD-10-CM

## 2023-12-16 DIAGNOSIS — R2681 Unsteadiness on feet: Secondary | ICD-10-CM

## 2023-12-16 NOTE — Therapy (Signed)
OUTPATIENT PHYSICAL THERAPY NEURO TREATMENT   Patient Name: Michael [name] MRN: [id number] DOB:[birth year], 68 y.o., male Today's Date: 12/16/2023   PCP: [name], MD REFERRING PROVIDER: Persons, [place], [place]  END OF SESSION:  PT End of Session - 12/16/23 1407     Visit Number 5    Number of Visits 17   16 + eval   Date for PT Re-Evaluation 01/31/24   pushed out due to aquatic needs   Authorization Type [employer] MEDICARE    Progress Note Due on Visit 10    PT Start Time 1404    PT Stop Time 1503    PT Time Calculation (min) 59 min    Equipment Utilized During Treatment Gait belt    Activity Tolerance Patient tolerated treatment well    Behavior During Therapy WFL for tasks assessed/performed             Past Medical History:  Diagnosis Date   Hyperlipidemia    Hypertension    Lumbar disc disease    Prostate cancer (HCC)    Past Surgical History:  Procedure Laterality Date   LUMBAR DISC SURGERY  2014   LUMBAR DISC SURGERY     right ankle fracture  1978   external pinning   Patient Active Problem List   Diagnosis Date Noted   Multiple rib fractures 08/28/2023   MGUS (monoclonal gammopathy of unknown significance) 06/10/2023   RBC microcytosis 06/10/2023   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   DDD (degenerative disc disease), lumbar 12/21/2022   Prostate cancer (HCC); T1c; GG 1; initial diagnosis 2011 12/05/2022   Chronic hyperglycemia 11/16/2022   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   Contusion of ulnar nerve, initial encounter 11/09/2022   Dyslipidemia, goal LDL below 70 09/23/2022   Elevated PSA, between 10 and less than 20 ng/ml 09/17/2022   Accelerated hypertension 09/17/2022   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   Vitamin D deficiency 09/07/2021    ONSET DATE: symptoms began in 2020; pt had a fall August 23, 2023  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  He reports he has been a little lazy.  He feels stiff because of sitting around.  He reports aquatics went very well and he enjoyed it.  He states he felt comfortable with all the exercises he was given and he felt safe despite being very challenged in his balance.  Pt accompanied by: self (he continues to drive himself)  PERTINENT HISTORY: degenerative disc disease (followed by [facility]) s/p L5-S1 surgery, HLD, HTN, prostate cancer  Patient's symptoms started in 2020 - right flank pain (reported initially as abdominal pain, but pt clarifies this is not a deep cramp/stomach pain, but a muscular soreness especially with movement), core weakness (not able to sit up well), sneezing and coughing was painful as well.  A year later he noted stiffness in his legs after sitting where he has to walk up his legs with his hands.  Now, he reports numbness and soreness in left hamstring area as well as swelling and muscle spasms (left worse).  In order to walk he has to weight shift and bring feet into narrowed BOS then start with small steps.  He loosens up with movement. He reports freezing when startled. Ambulates w/ SPC.  Upper body remains WNL.  Is being followed by hematology for possible thalassemia due to initial labs being positive for IgG kappa  monoclonal antibody on 05/03/2023.  Pt has a fall August 23, 2023 halting his prior PT episode due to broken ribs.  PAIN:  Are you having pain? No   PRECAUTIONS: Fall  RED FLAGS: None-pt does report more urinary urgency that he reports corresponds with the reported itch in his legs and low back, but he has discussed this with his physician (Dr. [name])  WEIGHT BEARING RESTRICTIONS: No  FALLS: Has patient fallen in last 6 months? Yes. 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  He modifies some homemaking tasks, but reports he can stand longer to do things like cooking.  PATIENT GOALS: Work on upper body strength  OBJECTIVE:   DIAGNOSTIC FINDINGS:  EMG on 05/14/23 showed an active left S1 radiculopathy (similar to prior EMG)  X-ray Bone Survey Met (11/06/2023):   IMPRESSION: No evidence of lytic or destructive bone lesion  X-ray Ribs Unilateral Right (09/04/2023): Radiographs of his right ribs demonstrate no more blunting of the  costophrenic angle he does have vascular markings going out to the  periphery.   COGNITION: Overall cognitive status: Within functional limits for tasks assessed and pt hyperverbal   SENSATION: Light touch: WFL  COORDINATION: LE RAMS:  slow and deliberate Bilateral Heel-to-shin:  limited by lack of bilateral ER  EDEMA:  bilateral ankle edema (baseline per pt - he has been using castor oil and lavender to improve skin integrity - MD aware of regimen)  MUSCLE TONE: None noted in  BLE  POSTURE: weight shift right  LOWER EXTREMITY ROM:     Active  Right Eval Left Eval  Hip flexion Grossly WFL - limited bilateral ER (  basically at neutral  Hip extension   Hip abduction   Hip adduction   Hip internal rotation   Hip external rotation   Knee flexion   Knee extension   Ankle dorsiflexion   Ankle plantarflexion    Ankle inversion    Ankle eversion     (Blank rows = not tested)  LOWER EXTREMITY MMT:    MMT Right Eval Left Eval  Hip flexion 3/5 3+/5  Hip extension    Hip abduction    Hip adduction    Hip internal rotation    Hip external rotation    Knee flexion    Knee extension 4+/5 4+/5  Ankle dorsiflexion 3+/5 4/5  Ankle plantarflexion    Ankle inversion    Ankle eversion    (Blank rows = not tested)  BED MOBILITY:  Pt reports his bed mobility has greatly improved and he has switched his setup so he enters and exits bed on side closest to his bathroom  TRANSFERS: Assistive device utilized: None  Sit to stand: Modified independence - uses hands Stand to sit: Modified independence Chair to chair: Complete Independence Floor:  pt performed modI on most recent fall  GAIT: Gait pattern: step through pattern, decreased step length- Right, decreased step length- Left, decreased stride length, decreased hip/knee flexion- Right, decreased hip/knee flexion- Left, lateral lean- Right, decreased trunk rotation, and narrow BOS Distance walked: various clinic distances Assistive device utilized: None Level of assistance: SBA Comments: No notable ataxia or involuntary movements during ambulation into/out of clinic.  FUNCTIONAL TESTS:  5 times sit to stand: 24.72 seconds w/ light BUE support - wide stance with some crouching into tall stand due to imbalance Timed up and go (TUG): To be assessed. 10 meter walk test: To be assessed.  PATIENT SURVEYS:  ABC scale To be assessed.  TODAY'S TREATMENT:                                                                                                                               DATE: 12/16/23 SciFit in multi-peaks mode to level 6.0 x8 minutes using BUE/BLE for general endurance and large amplitude reciprocal mobility.  RPE 7/10 following task.  Pt maintains 80-110 steps/minute throughout. Boxing combos: Front jab and cross x30 Alternating hooks x20 Alternating front kicks x14, CGA, pt requires increased time to kick LLE Side kicks > jab > cross x10 Cross > duck > jab x10 Cone drills: Lateral stepping alternating forward UE taps to yellow cones (3 of 6) 2x14 ft Lateral stepping alternating LE taps to red cones (3 of 6) 2x14 ft Lateral stepping w/ forward squat taps using 3.3lb weighted ball Standing w/ unilateral UE support to roll 10lb slam ball w/ contralateral LE x10 CW > x10 CCW each LE, SBA-CGA due to mild posterior lean w/ fatigue, LLE fatigue > than RLE in stance  PATIENT EDUCATION: Education details: Continue prior HEP.  Work back into regular walking program.  Chair yoga to supplement other higher level activity and help correct muscular imbalance. Person educated: Patient Education method: Explanation Education comprehension: verbalized understanding and needs further education  HOME EXERCISE PROGRAM: Review from episode prior - modify as needed:  Access Code: [email or web address]  You Can Walk For A Certain Length Of Time Each Day (can start with 3-4 days and work up to everyday - use cane for safety and walk in well lit familiar areas with smooth surfaces)                          Walk 5 minutes 2 times per day.             Increase 2  minutes every 7 days              Work up to 20 minutes (1x per day).               Example:                         Day 1-2           4-5 minutes     3 times per day                         Day 7-8           10-12 minutes 2-3 times per day                         Day 13-14       20-22 minutes 1-2 times per day  Chair yoga poses 1-6, 8-9 (modified position  9 and held x10 seconds), 12  GOALS: Goals reviewed with patient? Yes  SHORT TERM GOALS: Target date: 12/27/2023  Pt will decrease 5xSTS to </=19.72 seconds with improved immediate standing balance in order to demonstrate decreased risk for falls and improved functional bilateral LE strength and power. Baseline:  24.72 seconds w/ light BUE support Goal status: INITIAL  2.  Pt will demonstrate TUG of </=17.09 seconds in order to decrease risk of falls and improve functional mobility using LRAD. Baseline: 22.09 sec no AD SBA (2/3) Goal status: INITIAL  3.  Pt will demonstrate a gait speed of >/=2.52 feet/sec in order to decrease risk for falls. Baseline: 2.32 ft/sec (2/3) Goal status: INITIAL  LONG TERM GOALS: Target date: 01/24/2024  Pt will be independent and compliant with strength, stretching, and balance focused land-based HEP and aquatic HEP if continuing following discharge in order to maintain functional progress and improve mobility. Baseline:  To be established. Goal status: INITIAL  2.  Pt will decrease 5xSTS to </=14.72 seconds in order to demonstrate decreased risk for falls and improved functional bilateral LE strength and power. Baseline: 24.72 seconds w/ light BUE support Goal status: INITIAL  3.  Pt will demonstrate TUG of </=12.09 seconds in order to decrease risk of falls and improve functional mobility using LRAD. Baseline: 22.09 sec no AD SBA (2/3) Goal status: INITIAL  4.  Pt will demonstrate a gait speed of >/=2.72 feet/sec in order to decrease risk for falls. Baseline: 2.32 ft/sec (2/3) Goal status: INITIAL  5.  Pt will ambulate >/=800 feet at IND level over unlevel outdoor surfaces and grass to promote household and community access. Baseline: To be assessed. Goal status: INITIAL  6.  Pt will improve ABC Scale  to >/= 69.4% in order to demonstrate improved fall risk and engagement in daily activities. Baseline: 59.4% (2/3) Goal status:  INITIAL  ASSESSMENT:  CLINICAL IMPRESSION: Emphasis of skilled PT session today on coordination, strength and power.  He does very well with boxing tasks, PT added components to challenge thoracic mobility and static stability.  He was challenged with left focused components of cone tapping tasks and SLS tasks.  His LLE seems to fatigue much quicker and has slower motor recruitment than RLE.  He reports good response and laxity of his hips following ball rolling SLS task.  Will continue per POC.  OBJECTIVE IMPAIRMENTS: decreased activity tolerance, decreased balance, decreased coordination, decreased endurance, decreased knowledge of condition, decreased mobility, difficulty walking, decreased strength, increased edema, and increased muscle spasms.   ACTIVITY LIMITATIONS: carrying, lifting, bending, sitting, standing, squatting, stairs, transfers, bed mobility, and locomotion level  PARTICIPATION LIMITATIONS: shopping, community activity, and yard work  PERSONAL FACTORS: Age, Fitness, and 1-2 comorbidities: HTN, ongoing S1 radiculopathy  are also affecting patient's functional outcome.   REHAB POTENTIAL: Excellent  CLINICAL DECISION MAKING: Evolving/moderate complexity  EVALUATION COMPLEXITY: Moderate  PLAN:  PT FREQUENCY: 2x/week (1 land and 1 aquatic)  PT DURATION: 8 weeks  PLANNED INTERVENTIONS: 40981- PT Re-evaluation, 97110-Therapeutic exercises, 97530- Therapeutic activity, 97112- Neuromuscular re-education, 97535- Self Care, [id number]- Manual therapy, [phone]- Gait training, [phone]- Orthotic Fit/training, [phone]- Aquatic Therapy, Patient/Family education, Balance training, Stair training, Taping, Dry Needling, Joint mobilization, Spinal mobilization, Vestibular training, DME instructions, Moist heat, Therapeutic exercises, Therapeutic activity, Neuromuscular re-education, Gait training, and Self Care  PLAN FOR NEXT SESSION: Modify land HEP for core and LE strength, stretching, and  balance.  SciFit for reciprocal mobility.  Reaction time - blaze pods, balloon taps on compliant surfaces, timed puzzles on compliant surface.  Inclined shoulder taps.  Upper body strength.  Immediate standing balance - STS into step outs, etc.  Hip flexor strength.  Treadmill vs elliptical training?  Standing on incline board for static stretch and balance.  Walking stick on outdoor incline - staggered walking down decline - print resource for pt.  Standing hip flexor stretch.  Pt likes bowling.  Thoracic mobility!  Aquatics:  pt may not need aquatic HEP - unsure of transition into community pool - may need a list of community options (land PT can provide copy at next appt if needed), balance - ai chi and general strength/stretching of core, shoulders and hips, upright posture, walking tolerance; PT anticipates pt should be able to manage stairs at no more than supervision level  [name], PT, DPT 12/16/23 3:08 PM

## 2023-12-18 ENCOUNTER — Ambulatory Visit (HOSPITAL_BASED_OUTPATIENT_CLINIC_OR_DEPARTMENT_OTHER): Payer: Medicare Other | Admitting: Physical Therapy

## 2023-12-21 DIAGNOSIS — G2582 Stiff-man syndrome: Secondary | ICD-10-CM | POA: Diagnosis not present

## 2023-12-23 ENCOUNTER — Encounter: Payer: Self-pay | Admitting: Physical Therapy

## 2023-12-23 ENCOUNTER — Ambulatory Visit: Payer: Medicare Other | Admitting: Physical Therapy

## 2023-12-23 DIAGNOSIS — R252 Cramp and spasm: Secondary | ICD-10-CM

## 2023-12-23 DIAGNOSIS — M25662 Stiffness of left knee, not elsewhere classified: Secondary | ICD-10-CM

## 2023-12-23 DIAGNOSIS — M25672 Stiffness of left ankle, not elsewhere classified: Secondary | ICD-10-CM

## 2023-12-23 DIAGNOSIS — M6281 Muscle weakness (generalized): Secondary | ICD-10-CM

## 2023-12-23 DIAGNOSIS — R2689 Other abnormalities of gait and mobility: Secondary | ICD-10-CM

## 2023-12-23 DIAGNOSIS — M25652 Stiffness of left hip, not elsewhere classified: Secondary | ICD-10-CM

## 2023-12-23 DIAGNOSIS — R600 Localized edema: Secondary | ICD-10-CM

## 2023-12-23 DIAGNOSIS — R2681 Unsteadiness on feet: Secondary | ICD-10-CM

## 2023-12-23 DIAGNOSIS — Z9181 History of falling: Secondary | ICD-10-CM

## 2023-12-23 DIAGNOSIS — M25675 Stiffness of left foot, not elsewhere classified: Secondary | ICD-10-CM

## 2023-12-23 NOTE — Therapy (Signed)
 OUTPATIENT PHYSICAL THERAPY NEURO TREATMENT   Patient Name: Michael [name] MRN: [id number] DOB:August 03, 1956, 68 y.o., male Today's Date: 12/23/2023   PCP: [name], MD REFERRING PROVIDER: Persons, [place], [place]  END OF SESSION:  PT End of Session - 12/23/23 1417     Visit Number 6    Number of Visits 17   16 + eval   Date for PT Re-Evaluation 01/31/24   pushed out due to aquatic needs   Authorization Type [employer] MEDICARE    Progress Note Due on Visit 10    PT Start Time 1411    PT Stop Time 1453    PT Time Calculation (min) 42 min    Equipment Utilized During Treatment Gait belt    Activity Tolerance Patient tolerated treatment well    Behavior During Therapy WFL for tasks assessed/performed             Past Medical History:  Diagnosis Date   Hyperlipidemia    Hypertension    Lumbar disc disease    Prostate cancer (HCC)    Past Surgical History:  Procedure Laterality Date   LUMBAR DISC SURGERY  2014   LUMBAR DISC SURGERY     right ankle fracture  1978   external pinning   Patient Active Problem List   Diagnosis Date Noted   Multiple rib fractures 08/28/2023   MGUS (monoclonal gammopathy of unknown significance) 06/10/2023   RBC microcytosis 06/10/2023   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   DDD (degenerative disc disease), lumbar 12/21/2022   Prostate cancer (HCC); T1c; GG 1; initial diagnosis 2011 12/05/2022   Chronic hyperglycemia 11/16/2022   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   Contusion of ulnar nerve, initial encounter 11/09/2022   Dyslipidemia, goal LDL below 70 09/23/2022   Elevated PSA, between 10 and less than 20 ng/ml 09/17/2022   Accelerated hypertension 09/17/2022   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   Vitamin D deficiency 09/07/2021    ONSET DATE: symptoms began in 2020; pt had a fall August 23, 2023  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  He is a bit stiff today due to sitting in the lobby briefly.    Pt accompanied by: self (he continues to drive himself)  PERTINENT HISTORY: degenerative disc disease (followed by [facility]) s/p L5-S1 surgery, HLD, HTN, prostate cancer  Patient's symptoms started in 2020 - right flank pain (reported initially as abdominal pain, but pt clarifies this is not a deep cramp/stomach pain, but a muscular soreness especially with movement), core weakness (not able to sit up well), sneezing and coughing was painful as well.  A year later he noted stiffness in his legs after sitting where he has to walk up his legs with his hands.  Now, he reports numbness and soreness in left hamstring area as well as swelling and muscle spasms (left worse).  In order to walk he has to weight shift and bring feet into narrowed BOS then start with small steps.  He loosens up with movement. He reports freezing when startled. Ambulates w/ SPC.  Upper body remains WNL.  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  Pt has a fall August 23, 2023 halting his prior PT episode due to broken ribs.  PAIN:  Are you having pain? No - "a little stiffness in my back, but it's not pain."  PRECAUTIONS:  Fall  RED FLAGS: None-pt does report more urinary urgency that he reports corresponds with the reported itch in his legs and low back, but he has discussed this with his physician (Dr. [name])  WEIGHT BEARING RESTRICTIONS: No  FALLS: Has patient fallen in last 6 months? Yes. 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  He modifies some homemaking tasks, but reports he can stand longer to do things like cooking.  PATIENT GOALS: Work on upper body strength  OBJECTIVE:   DIAGNOSTIC FINDINGS:  EMG on 05/14/23 showed an active left S1 radiculopathy (similar to prior EMG)  X-ray Bone Survey Met (11/06/2023):   IMPRESSION: No evidence of lytic or destructive bone lesion  X-ray Ribs Unilateral Right (09/04/2023): Radiographs of his right ribs demonstrate no more blunting of the  costophrenic angle he does have vascular markings going out to the  periphery.   COGNITION: Overall cognitive status: Within functional limits for tasks assessed and pt hyperverbal   SENSATION: Light touch: WFL  COORDINATION: LE RAMS:  slow and deliberate Bilateral Heel-to-shin:  limited by lack of bilateral ER  EDEMA:  bilateral ankle edema (baseline per pt - he has been using castor oil and lavender to improve skin integrity - MD aware of regimen)  MUSCLE TONE: None noted in BLE  POSTURE: weight shift right  LOWER EXTREMITY ROM:     Active  Right Eval Left Eval  Hip flexion Grossly WFL - limited bilateral ER (basically at neutral   Hip extension   Hip abduction   Hip adduction   Hip internal rotation   Hip external rotation  Knee flexion   Knee extension   Ankle dorsiflexion   Ankle plantarflexion    Ankle inversion    Ankle eversion     (Blank rows = not tested)  LOWER EXTREMITY MMT:    MMT Right Eval Left Eval  Hip flexion 3/5 3+/5  Hip extension    Hip abduction    Hip adduction    Hip internal rotation    Hip external rotation    Knee flexion    Knee extension 4+/5 4+/5  Ankle dorsiflexion 3+/5 4/5  Ankle plantarflexion    Ankle inversion    Ankle eversion    (Blank rows = not tested)  BED MOBILITY:  Pt reports his bed mobility has greatly improved and he has switched his setup so he enters and exits bed on side closest to his bathroom  TRANSFERS: Assistive device utilized: None  Sit to stand: Modified independence - uses hands Stand to sit: Modified independence Chair to chair: Complete Independence Floor:  pt performed modI on most recent fall  GAIT: Gait pattern: step through pattern, decreased step length- Right, decreased step length- Left, decreased stride length, decreased hip/knee flexion- Right, decreased hip/knee flexion- Left, lateral lean- Right, decreased trunk rotation, and narrow BOS Distance walked: various clinic distances Assistive device utilized: None Level of assistance: SBA Comments: No notable ataxia or involuntary movements during ambulation into/out of clinic.  FUNCTIONAL TESTS:  5 times sit to stand: 24.72 seconds w/ light BUE support - wide stance with some crouching into tall stand due to imbalance Timed up and go (TUG): To be assessed. 10 meter walk test: To be assessed.  PATIENT SURVEYS:  ABC scale To be assessed.  TODAY'S TREATMENT:                                                                                                                              DATE: 12/23/23 SciFit in multi-peaks mode to level 6.0 x8 minutes using BUE/BLE for general  endurance and large amplitude reciprocal mobility.  RPE 6/10 following task.  Pt maintains >/=100 steps/minute throughout. STS w/ immediate step out alt LE x10 to address immediate standing balance Step out w/ rotational 8lb ball roll to target to mimic bowling mobility Step out 8lb slam ball alt LE x14 Standing normal BOS rotational 8lb slam ball left and right x20 Standing w/ fingertip support on back of chair lateral lunge into ipsilateral reach, pt has trouble engaging hip flexors and adductors to lift LE back to center w/ some improvement with repeated task x10 each side  PATIENT EDUCATION: Education details: Continue HEP and chair yoga.  Continue walking program.  Person educated: Patient Education method: Explanation Education comprehension: verbalized understanding and needs further education  HOME EXERCISE PROGRAM: Review from episode prior - modify as needed:  Access Code: [email or web address]  You Can Walk For A Certain Length Of Time Each Day (can start with 3-4 days and work up to everyday - use cane  for safety and walk in well lit familiar areas with smooth surfaces)                          Walk 5 minutes 2 times per day.             Increase 2  minutes every 7 days              Work up to 20 minutes (1x per day).               Example:                         Day 1-2           4-5 minutes     3 times per day                         Day 7-8           10-12 minutes 2-3 times per day                         Day 13-14       20-22 minutes 1-2 times per day  Chair yoga poses 1-6, 8-9 (modified position 9 and held x10 seconds), 12  GOALS: Goals reviewed with patient? Yes  SHORT TERM GOALS: Target date: 12/27/2023  Pt will decrease 5xSTS to </=19.72 seconds with improved immediate standing balance in order to demonstrate decreased risk for falls and improved functional bilateral LE strength and power. Baseline:  24.72 seconds w/ light BUE support Goal status: INITIAL  2.  Pt will  demonstrate TUG of </=17.09 seconds in order to decrease risk of falls and improve functional mobility using LRAD. Baseline: 22.09 sec no AD SBA (2/3) Goal status: INITIAL  3.  Pt will demonstrate a gait speed of >/=2.52 feet/sec in order to decrease risk for falls. Baseline: 2.32 ft/sec (2/3) Goal status: INITIAL  LONG TERM GOALS: Target date: 01/24/2024  Pt will be independent and compliant with strength, stretching, and balance focused land-based HEP and aquatic HEP if continuing following discharge in order to maintain functional progress and improve mobility. Baseline:  To be established. Goal status: INITIAL  2.  Pt will decrease 5xSTS to </=14.72 seconds in order to demonstrate decreased risk for falls and improved functional bilateral LE strength and power. Baseline: 24.72 seconds w/ light BUE support Goal status: INITIAL  3.  Pt will demonstrate TUG of </=12.09 seconds in order to decrease risk of falls and improve functional mobility using LRAD. Baseline: 22.09 sec no AD SBA (2/3) Goal status: INITIAL  4.  Pt will demonstrate a gait speed of >/=2.72 feet/sec in order to decrease risk for falls. Baseline: 2.32 ft/sec (2/3) Goal status: INITIAL  5.  Pt will ambulate >/=800 feet at IND level over unlevel outdoor surfaces and grass to promote household and community access. Baseline: To be assessed. Goal status: INITIAL  6.  Pt will improve ABC Scale to >/= 69.4% in order to demonstrate improved fall risk and engagement in daily activities. Baseline: 59.4% (2/3) Goal status: INITIAL  ASSESSMENT:  CLINICAL IMPRESSION: Emphasis of skilled PT session today on addressing immediate standing balance and general thoracic mobility in standing.  Incorporated some weighted tasks to challenge stability needed for enjoyed bowling hobby.  Patient overall progressing towards goals and having positive response to high  level activities.  Will continue per POC.  OBJECTIVE IMPAIRMENTS:  decreased activity tolerance, decreased balance, decreased coordination, decreased endurance, decreased knowledge of condition, decreased mobility, difficulty walking, decreased strength, increased edema, and increased muscle spasms.   ACTIVITY LIMITATIONS: carrying, lifting, bending, sitting, standing, squatting, stairs, transfers, bed mobility, and locomotion level  PARTICIPATION LIMITATIONS: shopping, community activity, and yard work  PERSONAL FACTORS: Age, Fitness, and 1-2 comorbidities: HTN, ongoing S1 radiculopathy  are also affecting patient's functional outcome.   REHAB POTENTIAL: Excellent  CLINICAL DECISION MAKING: Evolving/moderate complexity  EVALUATION COMPLEXITY: Moderate  PLAN:  PT FREQUENCY: 2x/week (1 land and 1 aquatic)  PT DURATION: 8 weeks  PLANNED INTERVENTIONS: 09811- PT Re-evaluation, 97110-Therapeutic exercises, 97530- Therapeutic activity, 97112- Neuromuscular re-education, 97535- Self Care, 91478- Manual therapy, [phone]- Gait training, [phone]- Orthotic Fit/training, [phone]- Aquatic Therapy, Patient/Family education, Balance training, Stair training, Taping, Dry Needling, Joint mobilization, Spinal mobilization, Vestibular training, DME instructions, Moist heat, Therapeutic exercises, Therapeutic activity, Neuromuscular re-education, Gait training, and Self Care  PLAN FOR NEXT SESSION: Modify land HEP for core and LE strength, stretching, and balance.  SciFit for reciprocal mobility.  Reaction time - blaze pods, balloon taps on compliant surfaces, timed puzzles on compliant surface.  Inclined shoulder taps.  Upper body strength.  Eyes closed.  Hip flexor strength.  Treadmill vs elliptical training?  Standing on incline board for static stretch and balance.  Walking stick on outdoor incline - staggered walking down decline - print resource for pt.  Standing hip flexor stretch.  Pt likes bowling.  Thoracic mobility!  ASSESS STGs!  Aquatics:  pt may not need aquatic HEP -  unsure of transition into community pool - may need a list of community options (land PT can provide copy at next appt if needed), balance - ai chi and general strength/stretching of core, shoulders and hips, upright posture, walking tolerance; PT anticipates pt should be able to manage stairs at no more than supervision level  [name], PT, DPT 12/23/23 3:22 PM

## 2023-12-25 ENCOUNTER — Encounter (HOSPITAL_BASED_OUTPATIENT_CLINIC_OR_DEPARTMENT_OTHER): Payer: Self-pay | Admitting: Physical Therapy

## 2023-12-25 ENCOUNTER — Ambulatory Visit (HOSPITAL_BASED_OUTPATIENT_CLINIC_OR_DEPARTMENT_OTHER): Payer: Medicare Other | Admitting: Physical Therapy

## 2023-12-25 DIAGNOSIS — M25662 Stiffness of left knee, not elsewhere classified: Secondary | ICD-10-CM | POA: Diagnosis not present

## 2023-12-25 DIAGNOSIS — R2689 Other abnormalities of gait and mobility: Secondary | ICD-10-CM | POA: Diagnosis not present

## 2023-12-25 DIAGNOSIS — M25652 Stiffness of left hip, not elsewhere classified: Secondary | ICD-10-CM

## 2023-12-25 DIAGNOSIS — R2681 Unsteadiness on feet: Secondary | ICD-10-CM

## 2023-12-25 NOTE — Therapy (Signed)
 OUTPATIENT PHYSICAL THERAPY NEURO TREATMENT   Patient Name: Michael [name] MRN: [id number] DOB:1956/07/03, 68 y.o., male Today's Date: 12/25/2023   PCP: [name], MD REFERRING PROVIDER: Persons, [place], [place]  END OF SESSION:  PT End of Session - 12/25/23 1446     Visit Number 7    Number of Visits 17   16 + eval   Date for PT Re-Evaluation 01/31/24   pushed out due to aquatic needs   Authorization Type [employer] MEDICARE    Progress Note Due on Visit 10    PT Start Time 1443    PT Stop Time 1525    PT Time Calculation (min) 42 min    Equipment Utilized During Treatment Gait belt    Activity Tolerance Patient tolerated treatment well    Behavior During Therapy WFL for tasks assessed/performed             Past Medical History:  Diagnosis Date   Hyperlipidemia    Hypertension    Lumbar disc disease    Prostate cancer (HCC)    Past Surgical History:  Procedure Laterality Date   LUMBAR DISC SURGERY  2014   LUMBAR DISC SURGERY     right ankle fracture  1978   external pinning   Patient Active Problem List   Diagnosis Date Noted   Multiple rib fractures 08/28/2023   MGUS (monoclonal gammopathy of unknown significance) 06/10/2023   RBC microcytosis 06/10/2023   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   DDD (degenerative disc disease), lumbar 12/21/2022   Prostate cancer (HCC); T1c; GG 1; initial diagnosis 2011 12/05/2022   Chronic hyperglycemia 11/16/2022   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   Contusion of ulnar nerve, initial encounter 11/09/2022   Dyslipidemia, goal LDL below 70 09/23/2022   Elevated PSA, between 10 and less than 20 ng/ml 09/17/2022   Accelerated hypertension 09/17/2022   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   Vitamin D deficiency 09/07/2021    ONSET DATE: symptoms began in 2020; pt had a fall August 23, 2023  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 Kink in right side a little looser was  able to prune a tree yesterday.  Not fatiguing like I had been"  Pt accompanied by: self (he continues to drive himself)  PERTINENT HISTORY: degenerative disc disease (followed by [facility]) s/p L5-S1 surgery, HLD, HTN, prostate cancer  Patient's symptoms started in 2020 - right flank pain (reported initially as abdominal pain, but pt clarifies this is not a deep cramp/stomach pain, but a muscular soreness especially with movement), core weakness (not able to sit up well), sneezing and coughing was painful as well.  A year later he noted stiffness in his legs after sitting where he has to walk up his legs with his hands.  Now, he reports numbness and soreness in left hamstring area as well as swelling and muscle spasms (left worse).  In order to walk he has to weight shift and bring feet into narrowed BOS then start with small steps.  He loosens up with movement. He reports freezing when startled. Ambulates w/ SPC.  Upper body remains WNL.  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  Pt has a fall August 23, 2023 halting his prior PT episode due to broken ribs.  PAIN:  Are you having pain? No - "a little stiffness in my back, but it's not pain."  PRECAUTIONS: Fall  RED FLAGS: None-pt does report more urinary urgency that he reports corresponds with the reported itch in his legs and low back, but he has discussed this with his  physician (Dr. [name])  WEIGHT BEARING RESTRICTIONS: No  FALLS: Has patient fallen in last 6 months? Yes. 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  He modifies some homemaking tasks, but reports he can stand longer to do things like cooking.  PATIENT GOALS: Work on upper body strength  OBJECTIVE:   DIAGNOSTIC FINDINGS:  EMG on 05/14/23 showed an active left S1 radiculopathy (similar to prior EMG)  X-ray Bone Survey Met (11/06/2023):   IMPRESSION: No evidence of lytic or destructive bone lesion  X-ray Ribs Unilateral Right (09/04/2023): Radiographs of his right ribs demonstrate no more blunting of the  costophrenic angle he does have vascular markings going out to the  periphery.   COGNITION: Overall cognitive status: Within functional limits for tasks assessed and pt hyperverbal   SENSATION: Light touch: WFL  COORDINATION: LE RAMS:  slow and deliberate Bilateral Heel-to-shin:  limited by lack of bilateral ER  EDEMA:  bilateral ankle edema (baseline per pt - he has been using castor oil and lavender to improve skin integrity - MD aware of regimen)  MUSCLE TONE: None noted in BLE  POSTURE: weight shift right  LOWER EXTREMITY ROM:     Active  Right Eval Left Eval  Hip flexion Grossly WFL - limited bilateral ER (basically at neutral  Hip extension   Hip abduction   Hip adduction   Hip internal rotation   Hip external rotation   Knee flexion   Knee extension   Ankle dorsiflexion   Ankle  plantarflexion    Ankle inversion    Ankle eversion     (Blank rows = not tested)  LOWER EXTREMITY MMT:    MMT Right Eval Left Eval  Hip flexion 3/5 3+/5  Hip extension    Hip abduction    Hip adduction    Hip internal rotation    Hip external rotation    Knee flexion    Knee extension 4+/5 4+/5  Ankle dorsiflexion 3+/5 4/5  Ankle plantarflexion    Ankle inversion    Ankle eversion    (Blank rows = not tested)  BED MOBILITY:  Pt reports his bed mobility has greatly improved and he has switched his setup so he enters and exits bed on side closest to his bathroom  TRANSFERS: Assistive device utilized: None  Sit to stand: Modified independence - uses hands Stand to sit: Modified independence Chair to chair: Complete Independence Floor:  pt performed modI on most recent fall  GAIT: Gait pattern: step through pattern, decreased step length- Right, decreased step length- Left, decreased stride length, decreased hip/knee flexion- Right, decreased hip/knee flexion- Left, lateral lean- Right, decreased trunk rotation, and narrow BOS Distance walked: various clinic distances Assistive device utilized: None Level of assistance: SBA Comments: No notable ataxia or involuntary movements during ambulation into/out of clinic.  FUNCTIONAL TESTS:  5 times sit to stand: 24.72 seconds w/ light BUE support - wide stance with some crouching into tall stand due to imbalance Timed up and go (TUG): To be assessed. 10 meter walk test: To be assessed.  PATIENT SURVEYS:  ABC scale To be assessed.  TODAY'S TREATMENT:                                                                                                                              DATE: 12/25/23 Pt seen for aquatic therapy today.  Treatment took place in water 3.5-4.75 ft in depth at the [facility] pool. Temp of water was 91.  Pt entered/exited the pool via stairs with hand rail.   *walking forward, back and side stepping  unsupported *Farmers carry using yellow HB forward and back x 2; side x 2 *side lunge with ue add/abd x 4 *L stretch *TrA solid noodle pull down wide stance then staggered x 10. Increased difficulty leading LLE (right hip weakness) *figure four stretch 4.0 ft ue support wall (right hip tight than left) *sitting balance straddling noodle (good challenge)  -cycling (difficultly with coordinated movements of le and ue *Hamstring stretch at ladder   Pt requires the buoyancy and hydrostatic pressure of water for support, and to offload joints by unweighting joint load by at least 50 % in navel deep water and by at least 75-80% in chest to neck deep water.  Viscosity of the water is needed for resistance of strengthening. Water current perturbations provides challenge to standing balance requiring increased core activation.  PATIENT EDUCATION: Education details: Continue HEP and chair yoga.  Continue  walking program.  Person educated: Patient Education method: Explanation Education comprehension: verbalized understanding and needs further education  HOME EXERCISE PROGRAM: Review from episode prior - modify as needed:  Access Code: [email or web address]  You Can Walk For A Certain Length Of Time Each Day (can start with 3-4 days and work up to everyday - use cane for safety and walk in well lit familiar areas with smooth surfaces)                          Walk 5 minutes 2 times per day.             Increase 2  minutes every 7 days              Work up to 20 minutes (1x per day).               Example:                         Day 1-2           4-5 minutes     3 times per day                         Day 7-8           10-12 minutes 2-3 times per day                         Day 13-14       20-22 minutes 1-2 times per day  Chair yoga poses 1-6, 8-9 (modified position 9 and held x10 seconds), 12  GOALS: Goals reviewed with patient? Yes  SHORT TERM GOALS: Target date: 12/27/2023  Pt will decrease 5xSTS to  </=19.72 seconds with improved immediate standing balance in order to demonstrate decreased risk for falls and improved functional bilateral LE strength and power. Baseline:  24.72 seconds w/ light BUE support Goal status: INITIAL  2.  Pt will demonstrate TUG of </=17.09 seconds in order to decrease risk of falls and improve functional mobility using LRAD. Baseline: 22.09 sec no AD SBA (2/3) Goal status: INITIAL  3.  Pt will demonstrate a gait speed of >/=2.52 feet/sec in order to decrease risk for falls. Baseline: 2.32 ft/sec (2/3) Goal status: INITIAL  LONG TERM GOALS: Target date: 01/24/2024  Pt will be independent and compliant with strength, stretching, and balance focused land-based HEP and aquatic HEP if continuing following discharge in order to maintain functional progress and improve mobility. Baseline:  To be established. Goal status: INITIAL  2.  Pt will decrease 5xSTS to </=14.72 seconds in order to demonstrate decreased risk for falls and improved functional bilateral LE strength and power. Baseline: 24.72 seconds w/ light BUE support Goal status: INITIAL  3.  Pt will demonstrate TUG of </=12.09 seconds in order to decrease risk of falls and improve functional mobility using LRAD. Baseline: 22.09 sec no AD SBA (2/3) Goal status: INITIAL  4.  Pt will demonstrate a gait speed of >/=2.72 feet/sec in order to decrease risk for falls. Baseline: 2.32 ft/sec (2/3) Goal status: INITIAL  5.  Pt will ambulate >/=800 feet at IND level over unlevel outdoor surfaces and grass to promote household and community access. Baseline: To be assessed. Goal status: INITIAL  6.  Pt will improve ABC Scale to >/= 69.4% in order to demonstrate improved fall risk  and engagement in daily activities. Baseline: 59.4% (2/3) Goal status: INITIAL  ASSESSMENT:  CLINICAL IMPRESSION: Right adductor weakness evident with staggered stance core engagement and hip hiking.  He executes hip hiking well  and reports feeling stiff in area No muscle spasm nor pain throughout session.  Increased focus on core and hip strength with excellent toleration. Goals ongoing   OBJECTIVE IMPAIRMENTS: decreased activity tolerance, decreased balance, decreased coordination, decreased endurance, decreased knowledge of condition, decreased mobility, difficulty walking, decreased strength, increased edema, and increased muscle spasms.   ACTIVITY LIMITATIONS: carrying, lifting, bending, sitting, standing, squatting, stairs, transfers, bed mobility, and locomotion level  PARTICIPATION LIMITATIONS: shopping, community activity, and yard work  PERSONAL FACTORS: Age, Fitness, and 1-2 comorbidities: HTN, ongoing S1 radiculopathy  are also affecting patient's functional outcome.   REHAB POTENTIAL: Excellent  CLINICAL DECISION MAKING: Evolving/moderate complexity  EVALUATION COMPLEXITY: Moderate  PLAN:  PT FREQUENCY: 2x/week (1 land and 1 aquatic)  PT DURATION: 8 weeks  PLANNED INTERVENTIONS: 09811- PT Re-evaluation, 97110-Therapeutic exercises, 97530- Therapeutic activity, 97112- Neuromuscular re-education, 97535- Self Care, 91478- Manual therapy, [phone]- Gait training, [phone]- Orthotic Fit/training, [phone]- Aquatic Therapy, Patient/Family education, Balance training, Stair training, Taping, Dry Needling, Joint mobilization, Spinal mobilization, Vestibular training, DME instructions, Moist heat, Therapeutic exercises, Therapeutic activity, Neuromuscular re-education, Gait training, and Self Care  PLAN FOR NEXT SESSION: Modify land HEP for core and LE strength, stretching, and balance.  SciFit for reciprocal mobility.  Reaction time - blaze pods, balloon taps on compliant surfaces, timed puzzles on compliant surface.  Inclined shoulder taps.  Upper body strength.  Eyes closed.  Hip flexor strength.  Treadmill vs elliptical training?  Standing on incline board for static stretch and balance.  Walking stick on outdoor  incline - staggered walking down decline - print resource for pt.  Standing hip flexor stretch.  Pt likes bowling.  Thoracic mobility!  ASSESS STGs!  Aquatics:  pt may not need aquatic HEP - unsure of transition into community pool - may need a list of community options (land PT can provide copy at next appt if needed), balance - ai chi and general strength/stretching of core, shoulders and hips, upright posture, walking tolerance; PT anticipates pt should be able to manage stairs at no more than supervision level  Corrie Dandy Tomma Lightning) Kennis Wissmann MPT 12/25/23 2:51 PM [facility] [address] [place], [place], [address] Phone: [phone]   Fax:  [phone]

## 2023-12-30 ENCOUNTER — Ambulatory Visit: Payer: Medicare Other | Attending: Physician Assistant | Admitting: Physical Therapy

## 2023-12-30 ENCOUNTER — Encounter: Payer: Self-pay | Admitting: Physical Therapy

## 2023-12-30 DIAGNOSIS — M25675 Stiffness of left foot, not elsewhere classified: Secondary | ICD-10-CM | POA: Diagnosis not present

## 2023-12-30 DIAGNOSIS — M25652 Stiffness of left hip, not elsewhere classified: Secondary | ICD-10-CM | POA: Diagnosis not present

## 2023-12-30 DIAGNOSIS — R252 Cramp and spasm: Secondary | ICD-10-CM | POA: Diagnosis not present

## 2023-12-30 DIAGNOSIS — R600 Localized edema: Secondary | ICD-10-CM | POA: Insufficient documentation

## 2023-12-30 DIAGNOSIS — Z9181 History of falling: Secondary | ICD-10-CM | POA: Insufficient documentation

## 2023-12-30 DIAGNOSIS — R2689 Other abnormalities of gait and mobility: Secondary | ICD-10-CM | POA: Diagnosis not present

## 2023-12-30 DIAGNOSIS — R2681 Unsteadiness on feet: Secondary | ICD-10-CM | POA: Diagnosis not present

## 2023-12-30 DIAGNOSIS — M25662 Stiffness of left knee, not elsewhere classified: Secondary | ICD-10-CM | POA: Insufficient documentation

## 2023-12-30 DIAGNOSIS — M25672 Stiffness of left ankle, not elsewhere classified: Secondary | ICD-10-CM | POA: Diagnosis not present

## 2023-12-30 DIAGNOSIS — M6281 Muscle weakness (generalized): Secondary | ICD-10-CM | POA: Diagnosis not present

## 2023-12-30 NOTE — Therapy (Signed)
 OUTPATIENT PHYSICAL THERAPY NEURO TREATMENT/PROGRESS NOTE   Patient Name: Michael [name] MRN: [id number] DOB:14-May-1956, 68 y.o., male Today's Date: 12/30/2023   PCP: [name], MD REFERRING PROVIDER: Persons, [place], [place]  PT progress note for Michael [name].  Reporting period 11/25/2023 to 12/30/2023  See Note below for Objective Data and Assessment of Progress/Goals  Thank you for the referral of this patient. [name], PT, DPT   END OF SESSION:  PT End of Session - 12/30/23 1422     Visit Number 8    Number of Visits 17   16 + eval   Date for PT Re-Evaluation 01/31/24   pushed out due to aquatic needs   Authorization Type [employer] MEDICARE    Progress Note Due on Visit 10    PT Start Time 1415   PT with eval prior   PT Stop Time 1500    PT Time Calculation (min) 45 min    Equipment Utilized During Treatment Gait belt    Activity Tolerance Patient tolerated treatment well    Behavior During Therapy WFL for tasks assessed/performed             Past Medical History:  Diagnosis Date   Hyperlipidemia    Hypertension    Lumbar disc disease    Prostate cancer (HCC)    Past Surgical History:  Procedure Laterality Date   LUMBAR DISC SURGERY  2014   LUMBAR DISC SURGERY     right ankle fracture  1978   external pinning   Patient Active Problem List   Diagnosis Date Noted   Multiple rib fractures 08/28/2023   MGUS (monoclonal gammopathy of unknown significance) 06/10/2023   RBC microcytosis 06/10/2023   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   DDD (degenerative disc disease), lumbar 12/21/2022   Prostate cancer (HCC); T1c; GG 1; initial diagnosis 2011 12/05/2022   Chronic hyperglycemia 11/16/2022   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   Contusion of ulnar nerve, initial encounter 11/09/2022   Dyslipidemia, goal LDL below 70 09/23/2022   Elevated PSA, between 10 and less than 20 ng/ml 09/17/2022   Accelerated hypertension 09/17/2022   Ventricular  bigeminy 12/05/2021   Vitamin D deficiency 09/07/2021    ONSET DATE: symptoms began in 2020; pt had a fall August 23, 2023  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  He does not enjoy taking his Diazepam, but takes it on therapy days.  He denies recent falls.  Pt accompanied by: self (he continues to drive himself)  PERTINENT HISTORY: degenerative disc disease (followed by [facility]) s/p L5-S1 surgery, HLD, HTN, prostate cancer  Patient's symptoms started in 2020 - right flank pain (reported initially as abdominal pain, but pt clarifies this is not a deep cramp/stomach pain, but a muscular soreness especially with movement), core weakness (not able to sit up well), sneezing and coughing was painful as well.  A year later he noted stiffness in his legs after sitting where he has to walk up his legs with his hands.  Now, he reports numbness and soreness in left hamstring area as well as swelling and muscle spasms (left worse).  In order to walk he has to weight shift and bring feet into narrowed BOS then start with small steps.  He loosens up with movement. He reports freezing when startled. Ambulates w/ SPC.  Upper body remains WNL.  Is  being followed by hematology for possible thalassemia due to initial labs being positive for IgG kappa monoclonal antibody on 05/03/2023.  Pt has a fall August 23, 2023 halting his prior PT episode due to broken ribs.  PAIN:  Are you having pain? No - "a little stiffness in my back, but it's not pain."  Right side intermittently stiff, "okay right now".  PRECAUTIONS: Fall  RED FLAGS: None-pt does report more urinary urgency that he reports corresponds with the reported itch in his legs and low back, but he has discussed this with his physician (Dr. [name])  WEIGHT BEARING RESTRICTIONS: No  FALLS: Has patient fallen in last 6 months? Yes. 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  He modifies some homemaking tasks, but reports he can stand longer to do things like cooking.  PATIENT GOALS: Work on upper body strength  OBJECTIVE:   DIAGNOSTIC FINDINGS:  EMG on 05/14/23 showed an active left S1 radiculopathy (similar to prior EMG)  X-ray Bone Survey Met (11/06/2023):   IMPRESSION: No evidence of lytic or destructive bone lesion  X-ray Ribs Unilateral Right (09/04/2023): Radiographs of his right ribs demonstrate no more blunting of the  costophrenic angle he does have vascular markings going out to the  periphery.   COGNITION: Overall cognitive status: Within functional limits for tasks assessed and pt hyperverbal   SENSATION: Light touch: WFL  COORDINATION: LE RAMS:  slow and deliberate Bilateral Heel-to-shin:   limited by lack of bilateral ER  EDEMA:  bilateral ankle edema (baseline per pt - he has been using castor oil and lavender to improve skin integrity - MD aware of regimen)  MUSCLE TONE: None noted in BLE  POSTURE: weight shift right  LOWER EXTREMITY ROM:     Active  Right Eval Left Eval  Hip flexion Grossly WFL - limited bilateral ER (basically at neutral  Hip extension   Hip abduction  Hip adduction   Hip internal rotation   Hip external rotation   Knee flexion   Knee extension   Ankle dorsiflexion   Ankle plantarflexion    Ankle inversion    Ankle eversion     (Blank rows = not tested)  LOWER EXTREMITY MMT:    MMT Right Eval Left Eval  Hip flexion 3/5 3+/5  Hip extension    Hip abduction    Hip adduction    Hip internal rotation    Hip external rotation    Knee flexion    Knee extension 4+/5 4+/5  Ankle dorsiflexion 3+/5 4/5  Ankle plantarflexion    Ankle inversion    Ankle eversion    (Blank rows = not tested)  BED MOBILITY:  Pt reports his bed mobility has greatly improved and he has switched his setup so he enters and exits bed on side closest to his bathroom  TRANSFERS: Assistive device utilized: None  Sit to stand: Modified independence - uses hands Stand to sit: Modified independence Chair to chair: Complete Independence Floor:  pt performed modI on most recent fall  GAIT: Gait pattern: step through pattern, decreased step length- Right, decreased step length- Left, decreased stride length, decreased hip/knee flexion- Right, decreased hip/knee flexion- Left, lateral lean- Right, decreased trunk rotation, and narrow BOS Distance walked: various clinic distances Assistive device utilized: None Level of assistance: SBA Comments: No notable ataxia or involuntary movements during ambulation into/out of clinic.  FUNCTIONAL TESTS:  5 times sit to stand: 24.72 seconds w/ light BUE support - wide stance with some crouching into tall stand due to  imbalance Timed up and go (TUG): To be assessed. 10 meter walk test: To be assessed.  PATIENT SURVEYS:  ABC scale To be assessed.  TODAY'S TREATMENT:                                                                                                                              DATE: 12/30/23 -NuStep x8 minutes level 6.0 using BUE/BLE for large amplitude reciprocal mobility and cardiovascular challenge.  RPE 6/10 midway through task.  Avg steps/min 98. -5xSTS:  15.97 sec w/ BUE support TUG:  -Trial 1:  13.25 sec no AD SBA  -Trial 2:  13.41 sec no AD SBA  -Trial 3:  11.91 sec no AD SBA, some wobbling during right pivot turn  -AVG:  12.86 sec no AD SBA - :  12.06 sec no AD SBA = 0.83 m/sec OR 2.74 ft/sec -Countertop plank shoulder taps > SLS on R x10 > SLS on L x10 progressing to SBA from CGA -Step up hip drives into mid-reverse lunge off bottom step x10 each side w/ BUE support -Standing bent knee hip extension x10 each side with PT facilitating upright posture and preventing pelvic rotation  PATIENT EDUCATION: Education details: Continue HEP and chair yoga.  Continue walking program.  Progress towards goals. Person educated: Patient Education method: Explanation Education comprehension: verbalized understanding  and needs further education  HOME EXERCISE PROGRAM: Review from episode prior - modify as needed:  Access Code: [email or web address]  You Can Walk For A Certain Length Of Time Each Day (can start with 3-4 days and work up to everyday - use cane for safety and walk in well lit familiar areas with smooth surfaces)                          Walk 5 minutes 2 times per day.             Increase 2  minutes every 7 days              Work up to 20 minutes (1x per day).               Example:                         Day 1-2           4-5 minutes     3 times per day                         Day 7-8           10-12 minutes 2-3 times per day                         Day 13-14       20-22 minutes 1-2  times per day  Chair yoga poses 1-6, 8-9 (modified position 9 and held x10 seconds), 12  GOALS: Goals reviewed with patient? Yes  SHORT TERM GOALS: Target date: 12/27/2023  Pt will decrease 5xSTS to </=19.72 seconds with improved immediate standing balance in order to demonstrate decreased risk for falls and improved functional bilateral LE strength and power. Baseline:  24.72 seconds w/ light BUE support; 15.97 sec w/ BUE support (3/3) Goal status: MET  2.  Pt will demonstrate TUG of </=17.09 seconds in order to decrease risk of falls and improve functional mobility using LRAD. Baseline: 22.09 sec no AD SBA (2/3); 12.86 sec no AD SBA (3/3) Goal status: MET  3.  Pt will demonstrate a gait speed of >/=2.52 feet/sec in order to decrease risk for falls. Baseline: 2.32 ft/sec (2/3); 2.74 ft/sec (3/3) Goal status: MET  LONG TERM GOALS: Target date: 01/24/2024  Pt will be independent and compliant with strength, stretching, and balance focused land-based HEP and aquatic HEP if continuing following discharge in order to maintain functional progress and improve mobility. Baseline:  To be established. Goal status: INITIAL  2.  Pt will decrease 5xSTS to </=14.72 seconds in order to demonstrate decreased risk for falls and improved functional bilateral LE strength and power. Baseline: 24.72 seconds w/ light BUE support Goal status: INITIAL  3.  Pt will demonstrate TUG of </=12.09 seconds in order to decrease risk of falls and improve functional mobility using LRAD. Baseline: 22.09 sec no AD SBA (2/3) Goal status: INITIAL  4.  Pt will demonstrate a gait speed of >/=2.92 feet/sec in order to decrease risk for falls. Baseline: 2.32 ft/sec (2/3); 2.74 ft/sec (3/3) Goal status: REVISED  5.  Pt will ambulate >/=800 feet at IND level over unlevel outdoor surfaces and grass to promote household and community access. Baseline: To be assessed. Goal status: INITIAL  6.  Pt will improve ABC Scale  to >/= 69.4% in  order to demonstrate improved fall risk and engagement in daily activities. Baseline: 59.4% (2/3) Goal status: INITIAL  ASSESSMENT:  CLINICAL IMPRESSION: Patient made excellent progress towards all goals as written today progressing gait speed to 2.74 ft/sec just passed LTG.  He continues to need UE reliance for functional transfer efficiency, but is faster in STS performance today.  His general mobility appears more balance and fluid, but he is still significantly limited when compared to age related norms.  He continues to benefit from skilled PT in land and aquatic formats to improve generalized flexibility, reaction time, and to optimize functional level.  Will continue per POC.   OBJECTIVE IMPAIRMENTS: decreased activity tolerance, decreased balance, decreased coordination, decreased endurance, decreased knowledge of condition, decreased mobility, difficulty walking, decreased strength, increased edema, and increased muscle spasms.   ACTIVITY LIMITATIONS: carrying, lifting, bending, sitting, standing, squatting, stairs, transfers, bed mobility, and locomotion level  PARTICIPATION LIMITATIONS: shopping, community activity, and yard work  PERSONAL FACTORS: Age, Fitness, and 1-2 comorbidities: HTN, ongoing S1 radiculopathy  are also affecting patient's functional outcome.   REHAB POTENTIAL: Excellent  CLINICAL DECISION MAKING: Evolving/moderate complexity  EVALUATION COMPLEXITY: Moderate  PLAN:  PT FREQUENCY: 2x/week (1 land and 1 aquatic)  PT DURATION: 8 weeks  PLANNED INTERVENTIONS: 28413- PT Re-evaluation, 97110-Therapeutic exercises, 97530- Therapeutic activity, 97112- Neuromuscular re-education, 97535- Self Care, 24401- Manual therapy, [phone]- Gait training, [phone]- Orthotic Fit/training, [phone]- Aquatic Therapy, Patient/Family education, Balance training, Stair training, Taping, Dry Needling, Joint mobilization, Spinal mobilization, Vestibular training, DME  instructions, Moist heat, Therapeutic exercises, Therapeutic activity, Neuromuscular re-education, Gait training, and Self Care  PLAN FOR NEXT SESSION: Modify land HEP for core and LE strength, stretching, and balance.  SciFit for reciprocal mobility.  Reaction time - blaze pods, balloon taps on compliant surfaces, timed puzzles on compliant surface.   Upper body strength.  Eyes closed.  Hip flexor strength.  Treadmill vs elliptical training?  Standing on incline board for static stretch and balance.  Walking stick on outdoor incline - staggered walking down decline - print resource for pt.  Standing hip flexor stretch.  Pt likes bowling.  Thoracic mobility!  R adductor strengthening  Aquatics:  pt may not need aquatic HEP - unsure of transition into community pool - may need a list of community options (land PT can provide copy at next appt if needed), balance - ai chi and general strength/stretching of core, shoulders and hips, upright posture, walking tolerance; PT anticipates pt should be able to manage stairs at no more than supervision level  [name], PT, DPT

## 2024-01-01 ENCOUNTER — Encounter (HOSPITAL_BASED_OUTPATIENT_CLINIC_OR_DEPARTMENT_OTHER): Payer: Self-pay | Admitting: Physical Therapy

## 2024-01-01 ENCOUNTER — Ambulatory Visit (HOSPITAL_BASED_OUTPATIENT_CLINIC_OR_DEPARTMENT_OTHER): Payer: Medicare Other | Attending: Physician Assistant | Admitting: Physical Therapy

## 2024-01-01 DIAGNOSIS — R2681 Unsteadiness on feet: Secondary | ICD-10-CM | POA: Diagnosis not present

## 2024-01-01 DIAGNOSIS — M25662 Stiffness of left knee, not elsewhere classified: Secondary | ICD-10-CM | POA: Insufficient documentation

## 2024-01-01 DIAGNOSIS — M25652 Stiffness of left hip, not elsewhere classified: Secondary | ICD-10-CM | POA: Diagnosis not present

## 2024-01-01 NOTE — Therapy (Signed)
 OUTPATIENT PHYSICAL THERAPY NEURO TREATMENT   Patient Name: Michael [name] MRN: [id number] DOB:20-Nov-1955, 68 y.o., male Today's Date: 01/01/2024   PCP: [name], MD REFERRING PROVIDER: Persons, [place], [place]    END OF SESSION:  PT End of Session - 01/01/24 1450     Number of Visits 17   16 + eval   Date for PT Re-Evaluation 01/31/24   pushed out due to aquatic needs   Authorization Type [employer] MEDICARE    Progress Note Due on Visit 10    PT Start Time 1447    PT Stop Time 1530    PT Time Calculation (min) 43 min    Equipment Utilized During Treatment Gait belt    Activity Tolerance Patient tolerated treatment well    Behavior During Therapy WFL for tasks assessed/performed             Past Medical History:  Diagnosis Date   Hyperlipidemia    Hypertension    Lumbar disc disease    Prostate cancer (HCC)    Past Surgical History:  Procedure Laterality Date   LUMBAR DISC SURGERY  2014   LUMBAR DISC SURGERY     right ankle fracture  1978   external pinning   Patient Active Problem List   Diagnosis Date Noted   Multiple rib fractures 08/28/2023   MGUS (monoclonal gammopathy of unknown significance) 06/10/2023   RBC microcytosis 06/10/2023   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   DDD (degenerative disc disease), lumbar 12/21/2022   Prostate cancer (HCC); T1c; GG 1; initial diagnosis 2011 12/05/2022   Chronic hyperglycemia 11/16/2022   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   Contusion of ulnar nerve, initial encounter 11/09/2022   Dyslipidemia, goal LDL below 70 09/23/2022   Elevated PSA, between 10 and less than 20 ng/ml 09/17/2022   Accelerated hypertension 09/17/2022   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   Vitamin D deficiency 09/07/2021    ONSET DATE: symptoms began in 2020; pt had a fall August 23, 2023  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  THERAPY DIAG:  Unsteadiness on feet  Stiffness of left knee, not elsewhere  classified  Stiffness of left hip, not elsewhere classified  Rationale for Evaluation and Treatment: Rehabilitation  SUBJECTIVE:                                                                                                                                                                                             SUBJECTIVE STATEMENT: Have been able to finish trimming the tree  Pt accompanied by: self (he continues to drive himself)  PERTINENT HISTORY: degenerative disc disease (followed  by [facility]) s/p L5-S1 surgery, HLD, HTN, prostate cancer  Patient's symptoms started in 2020 - right flank pain (reported initially as abdominal pain, but pt clarifies this is not a deep cramp/stomach pain, but a muscular soreness especially with movement), core weakness (not able to sit up well), sneezing and coughing was painful as well.  A year later he noted stiffness in his legs after sitting where he has to walk up his legs with his hands.  Now, he reports numbness and soreness in left hamstring area as well as swelling and muscle spasms (left worse).  In order to walk he has to weight shift and bring feet into narrowed BOS then start with small steps.  He loosens up with movement. He reports freezing when startled. Ambulates w/ SPC.  Upper body remains WNL.  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  Pt has a fall August 23, 2023 halting his prior PT episode due to broken ribs.  PAIN:  Are you having pain? No - "a little stiffness in my back, but it's not pain."  Right side intermittently stiff, "okay right now".  PRECAUTIONS: Fall  RED FLAGS: None-pt does report more urinary urgency that he reports corresponds with the reported itch in his legs and low back, but he has discussed this with his physician (Dr. [name])  WEIGHT BEARING RESTRICTIONS: No  FALLS: Has patient fallen in last  6 months? Yes. 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  He modifies some homemaking tasks, but reports he can stand longer to do things like cooking.  PATIENT GOALS: Work on upper body strength  OBJECTIVE:   DIAGNOSTIC FINDINGS:  EMG on 05/14/23 showed an active left S1 radiculopathy (similar to prior EMG)  X-ray Bone Survey Met (11/06/2023):   IMPRESSION: No evidence of lytic or destructive bone lesion  X-ray Ribs Unilateral Right (09/04/2023): Radiographs of his right ribs demonstrate no more blunting of the  costophrenic angle he does have vascular markings going out to the  periphery.   COGNITION: Overall cognitive status: Within functional limits for tasks assessed and pt hyperverbal   SENSATION: Light touch: WFL  COORDINATION: LE RAMS:  slow and deliberate Bilateral Heel-to-shin:  limited by lack of bilateral ER  EDEMA:  bilateral ankle edema (baseline per pt - he has been using castor oil and lavender to improve skin integrity - MD aware of regimen)  MUSCLE TONE: None noted in BLE  POSTURE: weight shift right  LOWER EXTREMITY ROM:     Active  Right Eval Left Eval  Hip flexion Grossly WFL - limited bilateral ER (basically at neutral  Hip extension   Hip abduction   Hip adduction   Hip internal rotation   Hip external rotation   Knee flexion   Knee extension   Ankle dorsiflexion   Ankle plantarflexion    Ankle inversion    Ankle eversion     (Blank rows = not tested)  LOWER  EXTREMITY MMT:    MMT Right Eval Left Eval  Hip flexion 3/5 3+/5  Hip extension    Hip abduction    Hip adduction    Hip internal rotation    Hip external rotation    Knee flexion    Knee extension 4+/5 4+/5  Ankle dorsiflexion 3+/5 4/5  Ankle plantarflexion    Ankle inversion    Ankle eversion    (Blank rows = not tested)  BED MOBILITY:  Pt reports his bed mobility has greatly improved and he has switched his setup so he enters and exits bed on side closest to his bathroom  TRANSFERS: Assistive device utilized: None  Sit to stand: Modified independence - uses hands Stand to sit: Modified independence Chair to chair: Complete Independence Floor:  pt performed modI on most recent fall  GAIT: Gait pattern: step through pattern, decreased step length- Right, decreased step length- Left, decreased stride length, decreased hip/knee flexion- Right, decreased hip/knee flexion- Left, lateral lean- Right, decreased trunk rotation, and narrow BOS Distance walked: various clinic distances Assistive device utilized: None Level of assistance: SBA Comments: No notable ataxia or involuntary movements during ambulation into/out of clinic.  FUNCTIONAL TESTS:  5 times sit to stand: 24.72 seconds w/ light BUE support - wide stance with some crouching into tall stand due to imbalance Timed up and go (TUG): To be assessed. 10 meter walk test: To be assessed.  PATIENT SURVEYS:  ABC scale To be assessed.  TODAY'S TREATMENT:                                                                                                                              01/01/24 Pt seen for aquatic therapy today.  Treatment took place in water 3.5-4.75 ft in depth at the [facility] pool. Temp of water was 91.  Pt entered/exited the pool via stairs with hand rail.   *walking forward, back and side stepping unsupported *L stretch *figure four stretch 4.0 ft ue support wall (right hip tight than left) also  completed in sitting *hamstring; gastroc, adductor and IT band stretch in standing using noodle *step ups leading R/L x10 *Runners step up/down on bottom step *hip hiking bottom step R/L *Intro side to side pendulums VC, demonstration and video good challenge/engaging of QL. Using blue HB *suspended sup: chin to chest x 30s-> knees to chest 2 x 5-> forward to back pendulum  Pt requires the buoyancy and hydrostatic pressure of water for support, and to offload joints by unweighting joint load by at least 50 % in navel deep water and by at least 75-80% in chest to neck deep water.  Viscosity of the water is needed for resistance of strengthening. Water current perturbations provides challenge to standing balance requiring increased core activation.    DATE: 12/30/23 -NuStep x8 minutes level 6.0 using BUE/BLE for large amplitude reciprocal mobility and cardiovascular challenge.  RPE 6/10 midway through task.  Avg steps/min 98. -  5xSTS:  15.97 sec w/ BUE support TUG:  -Trial 1:  13.25 sec no AD SBA  -Trial 2:  13.41 sec no AD SBA  -Trial 3:  11.91 sec no AD SBA, some wobbling during right pivot turn  -AVG:  12.86 sec no AD SBA - :  12.06 sec no AD SBA = 0.83 m/sec OR 2.74 ft/sec -Countertop plank shoulder taps > SLS on R x10 > SLS on L x10 progressing to SBA from CGA -Step up hip drives into mid-reverse lunge off bottom step x10 each side w/ BUE support -Standing bent knee hip extension x10 each side with PT facilitating upright posture and preventing pelvic rotation  PATIENT EDUCATION: Education details: Continue HEP and chair yoga.  Continue walking program.  Progress towards goals. Person educated: Patient Education method: Explanation Education comprehension: verbalized understanding and needs further education  HOME EXERCISE PROGRAM: Review from episode prior - modify as needed:  Access Code: [email or web address]  You Can Walk For A Certain Length Of Time Each Day (can start with 3-4 days and  work up to everyday - use cane for safety and walk in well lit familiar areas with smooth surfaces)                          Walk 5 minutes 2 times per day.             Increase 2  minutes every 7 days              Work up to 20 minutes (1x per day).               Example:                         Day 1-2           4-5 minutes     3 times per day                         Day 7-8           10-12 minutes 2-3 times per day                         Day 13-14       20-22 minutes 1-2 times per day  Chair yoga poses 1-6, 8-9 (modified position 9 and held x10 seconds), 12  GOALS: Goals reviewed with patient? Yes  SHORT TERM GOALS: Target date: 12/27/2023  Pt will decrease 5xSTS to </=19.72 seconds with improved immediate standing balance in order to demonstrate decreased risk for falls and improved functional bilateral LE strength and power. Baseline:  24.72 seconds w/ light BUE support; 15.97 sec w/ BUE support (3/3) Goal status: MET  2.  Pt will demonstrate TUG of </=17.09 seconds in order to decrease risk of falls and improve functional mobility using LRAD. Baseline: 22.09 sec no AD SBA (2/3); 12.86 sec no AD SBA (3/3) Goal status: MET  3.  Pt will demonstrate a gait speed of >/=2.52 feet/sec in order to decrease risk for falls. Baseline: 2.32 ft/sec (2/3); 2.74 ft/sec (3/3) Goal status: MET  LONG TERM GOALS: Target date: 01/24/2024  Pt will be independent and compliant with strength, stretching, and balance focused land-based HEP and aquatic HEP if continuing following discharge in order to maintain functional progress and improve mobility. Baseline:  To be established. Goal status:  INITIAL  2.  Pt will decrease 5xSTS to </=14.72 seconds in order to demonstrate decreased risk for falls and improved functional bilateral LE strength and power. Baseline: 24.72 seconds w/ light BUE support Goal status: INITIAL  3.  Pt will demonstrate TUG of </=12.09 seconds in order to decrease risk of  falls and improve functional mobility using LRAD. Baseline: 22.09 sec no AD SBA (2/3) Goal status: INITIAL  4.  Pt will demonstrate a gait speed of >/=2.92 feet/sec in order to decrease risk for falls. Baseline: 2.32 ft/sec (2/3); 2.74 ft/sec (3/3) Goal status: REVISED  5.  Pt will ambulate >/=800 feet at IND level over unlevel outdoor surfaces and grass to promote household and community access. Baseline: To be assessed. Goal status: INITIAL  6.  Pt will improve ABC Scale to >/= 69.4% in order to demonstrate improved fall risk and engagement in daily activities. Baseline: 59.4% (2/3) Goal status: INITIAL  ASSESSMENT:  CLINICAL IMPRESSION: Progressed core and hip strengthening with good toleration.  He has some difficulty with coordination with pendulums but puts forth good effort. Needs ue support with runners step ups for balance and proper execution.  Demonstrated and completed figure 4 stretch in standing as well as in sitting with encouragement to complete daily at home.  Goals ongoing    OBJECTIVE IMPAIRMENTS: decreased activity tolerance, decreased balance, decreased coordination, decreased endurance, decreased knowledge of condition, decreased mobility, difficulty walking, decreased strength, increased edema, and increased muscle spasms.   ACTIVITY LIMITATIONS: carrying, lifting, bending, sitting, standing, squatting, stairs, transfers, bed mobility, and locomotion level  PARTICIPATION LIMITATIONS: shopping, community activity, and yard work  PERSONAL FACTORS: Age, Fitness, and 1-2 comorbidities: HTN, ongoing S1 radiculopathy  are also affecting patient's functional outcome.   REHAB POTENTIAL: Excellent  CLINICAL DECISION MAKING: Evolving/moderate complexity  EVALUATION COMPLEXITY: Moderate  PLAN:  PT FREQUENCY: 2x/week (1 land and 1 aquatic)  PT DURATION: 8 weeks  PLANNED INTERVENTIONS: 54098- PT Re-evaluation, 97110-Therapeutic exercises, 97530- Therapeutic  activity, 97112- Neuromuscular re-education, 97535- Self Care, 11914- Manual therapy, [phone]- Gait training, [phone]- Orthotic Fit/training, [phone]- Aquatic Therapy, Patient/Family education, Balance training, Stair training, Taping, Dry Needling, Joint mobilization, Spinal mobilization, Vestibular training, DME instructions, Moist heat, Therapeutic exercises, Therapeutic activity, Neuromuscular re-education, Gait training, and Self Care  PLAN FOR NEXT SESSION: Modify land HEP for core and LE strength, stretching, and balance.  SciFit for reciprocal mobility.  Reaction time - blaze pods, balloon taps on compliant surfaces, timed puzzles on compliant surface.   Upper body strength.  Eyes closed.  Hip flexor strength.  Treadmill vs elliptical training?  Standing on incline board for static stretch and balance.  Walking stick on outdoor incline - staggered walking down decline - print resource for pt.  Standing hip flexor stretch.  Pt likes bowling.  Thoracic mobility!  R adductor strengthening  Aquatics:  pt may not need aquatic HEP - unsure of transition into community pool - may need a list of community options (land PT can provide copy at next appt if needed), balance - ai chi and general strength/stretching of core, shoulders and hips, upright posture, walking tolerance; PT anticipates pt should be able to manage stairs at no more than supervision level  Corrie Dandy Tomma Lightning) Reda Gettis MPT 01/01/24 5:29 PM [facility] [address] [place], [place], [address] Phone: [phone]   Fax:  [phone]

## 2024-01-06 ENCOUNTER — Encounter: Payer: Self-pay | Admitting: Physical Therapy

## 2024-01-06 ENCOUNTER — Ambulatory Visit: Payer: Medicare Other | Admitting: Physical Therapy

## 2024-01-06 DIAGNOSIS — R252 Cramp and spasm: Secondary | ICD-10-CM

## 2024-01-06 DIAGNOSIS — R2681 Unsteadiness on feet: Secondary | ICD-10-CM

## 2024-01-06 DIAGNOSIS — Z9181 History of falling: Secondary | ICD-10-CM

## 2024-01-06 DIAGNOSIS — M25652 Stiffness of left hip, not elsewhere classified: Secondary | ICD-10-CM | POA: Diagnosis not present

## 2024-01-06 DIAGNOSIS — M25672 Stiffness of left ankle, not elsewhere classified: Secondary | ICD-10-CM | POA: Diagnosis not present

## 2024-01-06 DIAGNOSIS — M25662 Stiffness of left knee, not elsewhere classified: Secondary | ICD-10-CM | POA: Diagnosis not present

## 2024-01-06 DIAGNOSIS — R2689 Other abnormalities of gait and mobility: Secondary | ICD-10-CM

## 2024-01-06 DIAGNOSIS — M25675 Stiffness of left foot, not elsewhere classified: Secondary | ICD-10-CM | POA: Diagnosis not present

## 2024-01-06 DIAGNOSIS — R600 Localized edema: Secondary | ICD-10-CM | POA: Diagnosis not present

## 2024-01-06 DIAGNOSIS — M6281 Muscle weakness (generalized): Secondary | ICD-10-CM

## 2024-01-06 NOTE — Therapy (Signed)
 OUTPATIENT PHYSICAL THERAPY NEURO TREATMENT/10th PN!   Patient Name: Michael [name] MRN: [id number] DOB:22-Jul-1956, 68 y.o., male Today's Date: 01/06/2024   PCP: [name], MD REFERRING PROVIDER: Persons, [place], [place]  PT progress note for Michael [name].  Reporting period 11/25/2023 to 01/06/2024  See Note below for Objective Data and Assessment of Progress/Goals  Thank you for the referral of this patient. [name], PT, DPT  END OF SESSION:  PT End of Session - 01/06/24 1424     Visit Number 10    Number of Visits 17   16 + eval   Date for PT Re-Evaluation 01/31/24   pushed out due to aquatic needs   Authorization Type [employer] MEDICARE    Progress Note Due on Visit 10    PT Start Time 1413   PT with pt prior   PT Stop Time 1454    PT Time Calculation (min) 41 min    Equipment Utilized During Treatment Gait belt    Activity Tolerance Patient tolerated treatment well    Behavior During Therapy WFL for tasks assessed/performed             Past Medical History:  Diagnosis Date   Hyperlipidemia    Hypertension    Lumbar disc disease    Prostate cancer (HCC)    Past Surgical History:  Procedure Laterality Date   LUMBAR DISC SURGERY  2014   LUMBAR DISC SURGERY     right ankle fracture  1978   external pinning   Patient Active Problem List   Diagnosis Date Noted   Multiple rib fractures 08/28/2023   MGUS (monoclonal gammopathy of unknown significance) 06/10/2023   RBC microcytosis 06/10/2023   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   DDD (degenerative disc disease), lumbar 12/21/2022   Prostate cancer (HCC); T1c; GG 1; initial diagnosis 2011 12/05/2022   Chronic hyperglycemia 11/16/2022   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   Contusion of ulnar nerve, initial encounter 11/09/2022   Dyslipidemia, goal LDL below 70 09/23/2022   Elevated PSA, between 10 and less than 20 ng/ml 09/17/2022   Accelerated hypertension 09/17/2022   Ventricular bigeminy  12/05/2021   Vitamin D deficiency 09/07/2021    ONSET DATE: symptoms began in 2020; pt had a fall August 23, 2023  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  He states he has been delayed getting one of his medicines and he thinks this is why.  He denies recent falls, but has felt much more stiff this week.  He states water therapy has been an excellent help to him.    Pt accompanied by: self (he continues to drive himself)  PERTINENT HISTORY: degenerative disc disease (followed by [facility]) s/p L5-S1 surgery, HLD, HTN, prostate cancer  Patient's symptoms started in 2020 - right flank pain (reported initially as abdominal pain, but pt clarifies this is not a deep cramp/stomach pain, but a muscular soreness especially with movement), core weakness (not able to sit up well), sneezing and coughing was painful as well.  A year later he noted stiffness in his legs after sitting where he has to walk up his legs with his hands.  Now, he reports numbness and soreness in left hamstring area as well as swelling and muscle spasms (left worse).  In order to walk he has to weight shift and bring feet into  narrowed BOS then start with small steps.  He loosens up with movement. He reports freezing when startled. Ambulates w/ SPC.  Upper body remains WNL.  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  Pt has a fall August 23, 2023 halting his prior PT episode due to broken ribs.  PAIN:  Are you having pain? No - "a little stiffness in my left leg"  PRECAUTIONS: Fall  RED FLAGS: None-pt does report more urinary urgency that he reports corresponds with the reported itch in his legs and low back, but he has discussed this with his physician (Dr. [name])  WEIGHT BEARING RESTRICTIONS: No  FALLS: Has patient fallen in last 6 months? Yes. 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  He modifies some homemaking tasks, but reports he can stand longer to do things like cooking.  PATIENT GOALS: Work on upper body strength  OBJECTIVE:   DIAGNOSTIC FINDINGS:  EMG on 05/14/23 showed an active left S1 radiculopathy (similar to prior EMG)  X-ray Bone Survey Met (11/06/2023):   IMPRESSION: No evidence of lytic or destructive bone lesion  X-ray Ribs Unilateral Right (09/04/2023): Radiographs of his right ribs demonstrate no more blunting of the  costophrenic angle he does have vascular markings going out to the  periphery.   COGNITION: Overall cognitive status: Within functional limits for tasks assessed and pt  hyperverbal   SENSATION: Light touch: WFL  COORDINATION: LE RAMS:  slow and deliberate Bilateral Heel-to-shin:  limited by lack of bilateral ER  EDEMA:  bilateral ankle edema (baseline per pt - he has been using castor oil and lavender to improve skin integrity - MD aware of regimen)  MUSCLE TONE: None noted in BLE  POSTURE: weight shift right  LOWER EXTREMITY ROM:     Active  Right Eval Left Eval  Hip flexion Grossly WFL - limited bilateral ER (basically at neutral  Hip extension   Hip abduction   Hip adduction   Hip internal rotation   Hip external rotation   Knee flexion   Knee extension   Ankle dorsiflexion   Ankle plantarflexion    Ankle inversion    Ankle eversion     (Blank rows = not tested)  LOWER EXTREMITY MMT:    MMT Right Eval Left Eval  Hip flexion 3/5 3+/5  Hip extension    Hip abduction    Hip adduction    Hip internal rotation    Hip external rotation    Knee flexion    Knee extension 4+/5 4+/5  Ankle dorsiflexion 3+/5 4/5  Ankle plantarflexion    Ankle inversion    Ankle eversion    (Blank rows = not tested)  BED MOBILITY:  Pt reports his bed mobility has greatly improved and he has switched his setup so he enters and exits bed on side closest to his bathroom  TRANSFERS: Assistive device utilized: None  Sit to stand: Modified independence - uses hands Stand to sit: Modified independence Chair to chair: Complete Independence Floor:  pt performed modI on most recent fall  GAIT: Gait pattern: step through pattern, decreased step length- Right, decreased step length- Left, decreased stride length, decreased hip/knee flexion- Right, decreased hip/knee flexion- Left, lateral lean- Right, decreased trunk rotation, and narrow BOS Distance walked: various clinic distances Assistive device utilized: None Level of assistance: SBA Comments: No notable ataxia or involuntary movements during ambulation into/out of clinic.  FUNCTIONAL TESTS:   5 times sit to stand: 24.72 seconds w/ light BUE support - wide stance with some crouching into tall stand due to imbalance Timed up and go (TUG): To be assessed. 10 meter walk test: To be assessed.  PATIENT SURVEYS:  ABC scale To be assessed.  TODAY'S TREATMENT:                                                                                                                              01/06/24 -NuStep x9 minutes progressing from level 4.0 to level 6.0 using BUE/BLE for large amplitude reciprocal mobility and cardiovascular challenge.  RPE 8/10 midway through task.  Avg steps/min 101. -Thoracic seated rotation stretch 2x30 sec each side > side bending stretch 3x10 seconds each side > open chest hands at head x1 minute > contralateral elbow to knee crunch x10 each side > adductor squeeze to 6lb ball x10 w/ 3 sec squeeze > ball squeeze STS x10, pt drops balls on initial rep, but demonstrate improved coordination on remaining reps -UE and LE taps to alternating surface heights and target color w/ progressed sequence from single to 4 colors called for direction changing, reaction time, and SLS; no errors -3 color flat to 8" targets w/ pt alternating LE taps while verbalizing coordination item with target color for dual tasking and  SLS  PATIENT EDUCATION: Education details: Continue HEP and chair yoga.  Continue walking program.  Progress noted for progress note today based on goals last visit. Person educated: Patient Education method: Explanation Education comprehension: verbalized understanding and needs further education  HOME EXERCISE PROGRAM: Review from episode prior - modify as needed:  Access Code: [email or web address]  You Can Walk For A Certain Length Of Time Each Day (can start with 3-4 days and work up to everyday - use cane for safety and walk in well lit familiar areas with smooth surfaces)                          Walk 5 minutes 2 times per day.             Increase 2  minutes every 7 days               Work up to 20 minutes (1x per day).               Example:                         Day 1-2           4-5 minutes     3 times per day                         Day 7-8           10-12 minutes 2-3 times per day                         Day 13-14       20-22 minutes 1-2 times per day  Chair yoga poses 1-6, 8-9 (modified position 9 and held x10 seconds), 12  GOALS: Goals reviewed with patient? Yes  SHORT TERM GOALS: Target date: 12/27/2023  Pt will decrease 5xSTS to </=19.72 seconds with improved immediate standing balance in order to demonstrate decreased risk for falls and improved functional bilateral LE strength and power. Baseline:  24.72 seconds w/ light BUE support; 15.97 sec w/ BUE support (3/3) Goal status: MET  2.  Pt will demonstrate TUG of </=17.09 seconds in order to decrease risk of falls and improve functional mobility using LRAD. Baseline: 22.09 sec no AD SBA (2/3); 12.86 sec no AD SBA (3/3) Goal status: MET  3.  Pt will demonstrate a gait speed of >/=2.52 feet/sec in order to decrease risk for falls. Baseline: 2.32 ft/sec (2/3); 2.74 ft/sec (3/3) Goal status: MET  LONG TERM GOALS: Target date: 01/24/2024  Pt will be independent and compliant with strength, stretching, and balance focused land-based HEP and aquatic HEP if continuing following discharge in order to maintain functional progress and improve mobility. Baseline:  To be established. Goal status: INITIAL  2.  Pt will decrease 5xSTS to </=14.72 seconds in order to demonstrate decreased risk for falls and improved functional bilateral LE strength and power. Baseline: 24.72 seconds w/ light BUE support Goal status: INITIAL  3.  Pt will demonstrate TUG of </=12.09 seconds in order to decrease risk of falls and improve functional mobility using LRAD. Baseline: 22.09 sec no AD SBA (2/3) Goal status: INITIAL  4.  Pt will demonstrate a gait speed of >/=2.92 feet/sec in order to decrease risk for  falls. Baseline: 2.32 ft/sec (2/3); 2.74 ft/sec (3/3) Goal status: REVISED  5.  Pt will  ambulate >/=800 feet at IND level over unlevel outdoor surfaces and grass to promote household and community access. Baseline: To be assessed. Goal status: INITIAL  6.  Pt will improve ABC Scale to >/= 69.4% in order to demonstrate improved fall risk and engagement in daily activities. Baseline: 59.4% (2/3) Goal status: INITIAL  ASSESSMENT:  CLINICAL IMPRESSION: Patient noted to have met all goals at STG assessment 3/3 with marked improvement in his gait speed and STS time.  He continues to benefit from aquatic setting to improve fluidity and ease of mobility due to condition.  Land PT able to progress to high level SLS tasks and implement more cognitive load with tasks this visit to assess how these impact his overall mobility.  He does have some infrequent LOB today with obstacle approach requiring more cuing to improve approximation which remains difficult as this demands increased hip flexion.  His hip flexors and adductors remain weak impacting his functional gait pattern.  PT to continue addressing these and all other deficits outlined in ongoing PT POC.  OBJECTIVE IMPAIRMENTS: decreased activity tolerance, decreased balance, decreased coordination, decreased endurance, decreased knowledge of condition, decreased mobility, difficulty walking, decreased strength, increased edema, and increased muscle spasms.   ACTIVITY LIMITATIONS: carrying, lifting, bending, sitting, standing, squatting, stairs, transfers, bed mobility, and locomotion level  PARTICIPATION LIMITATIONS: shopping, community activity, and yard work  PERSONAL FACTORS: Age, Fitness, and 1-2 comorbidities: HTN, ongoing S1 radiculopathy  are also affecting patient's functional outcome.   REHAB POTENTIAL: Excellent  CLINICAL DECISION MAKING: Evolving/moderate complexity  EVALUATION COMPLEXITY: Moderate  PLAN:  PT FREQUENCY: 2x/week  (1 land and 1 aquatic)  PT DURATION: 8 weeks  PLANNED INTERVENTIONS: 62130- PT Re-evaluation, 97110-Therapeutic exercises, 97530- Therapeutic activity, 97112- Neuromuscular re-education, 97535- Self Care, [id number]- Manual therapy, [phone]- Gait training, [phone]- Orthotic Fit/training, [phone]- Aquatic Therapy, Patient/Family education, Balance training, Stair training, Taping, Dry Needling, Joint mobilization, Spinal mobilization, Vestibular training, DME instructions, Moist heat, Therapeutic exercises, Therapeutic activity, Neuromuscular re-education, Gait training, and Self Care  PLAN FOR NEXT SESSION: Modify land HEP for core and LE strength, stretching, and balance.  SciFit for reciprocal mobility.  Reaction time - blaze pods, balloon taps on compliant surfaces, timed puzzles on compliant surface.   Upper body strength.  Eyes closed.  Hip flexor strength.  Treadmill training. Standing on incline board for static stretch and balance.  Walking stick on outdoor incline - staggered walking down decline - print resource for pt.  Standing hip flexor stretch.  Standing windmills vs modified w/ unilateral UE support.  R adductor strengthening  Aquatics:  pt may not need aquatic HEP - unsure of transition into community pool - may need a list of community options (land PT can provide copy at next appt if needed), balance - ai chi and general strength/stretching of core, shoulders and hips, upright posture, walking tolerance; PT anticipates pt should be able to manage stairs at no more than supervision level  [name], PT, DPT

## 2024-01-08 ENCOUNTER — Ambulatory Visit (HOSPITAL_BASED_OUTPATIENT_CLINIC_OR_DEPARTMENT_OTHER): Payer: Medicare Other | Admitting: Physical Therapy

## 2024-01-08 ENCOUNTER — Encounter (HOSPITAL_BASED_OUTPATIENT_CLINIC_OR_DEPARTMENT_OTHER): Payer: Self-pay | Admitting: Physical Therapy

## 2024-01-08 DIAGNOSIS — M25652 Stiffness of left hip, not elsewhere classified: Secondary | ICD-10-CM | POA: Diagnosis not present

## 2024-01-08 DIAGNOSIS — M25662 Stiffness of left knee, not elsewhere classified: Secondary | ICD-10-CM

## 2024-01-08 DIAGNOSIS — R2681 Unsteadiness on feet: Secondary | ICD-10-CM | POA: Diagnosis not present

## 2024-01-08 NOTE — Therapy (Signed)
 OUTPATIENT PHYSICAL THERAPY NEURO TREATMENT   Patient Name: Michael [name] MRN: [id number] DOB:1956/10/07, 68 y.o., male Today's Date: 01/08/2024   PCP: [name], MD REFERRING PROVIDER: Persons, [place], [place]   END OF SESSION:  PT End of Session - 01/08/24 1451     Visit Number 11    Number of Visits 17   16 + eval   Date for PT Re-Evaluation 01/31/24   pushed out due to aquatic needs   Authorization Type [employer] MEDICARE    Progress Note Due on Visit 10    PT Start Time 1447    PT Stop Time 1525    PT Time Calculation (min) 38 min    Equipment Utilized During Treatment Gait belt    Activity Tolerance Patient tolerated treatment well    Behavior During Therapy WFL for tasks assessed/performed             Past Medical History:  Diagnosis Date   Hyperlipidemia    Hypertension    Lumbar disc disease    Prostate cancer (HCC)    Past Surgical History:  Procedure Laterality Date   LUMBAR DISC SURGERY  2014   LUMBAR DISC SURGERY     right ankle fracture  1978   external pinning   Patient Active Problem List   Diagnosis Date Noted   Multiple rib fractures 08/28/2023   MGUS (monoclonal gammopathy of unknown significance) 06/10/2023   RBC microcytosis 06/10/2023   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   DDD (degenerative disc disease), lumbar 12/21/2022   Prostate cancer (HCC); T1c; GG 1; initial diagnosis 2011 12/05/2022   Chronic hyperglycemia 11/16/2022   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   Contusion of ulnar nerve, initial encounter 11/09/2022   Dyslipidemia, goal LDL below 70 09/23/2022   Elevated PSA, between 10 and less than 20 ng/ml 09/17/2022   Accelerated hypertension 09/17/2022   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   Vitamin D deficiency 09/07/2021    ONSET DATE: symptoms began in 2020; pt had a fall August 23, 2023  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  THERAPY DIAG:  Unsteadiness on feet  Stiffness of left knee, not  elsewhere classified  Stiffness of left hip, not elsewhere classified  Rationale for Evaluation and Treatment: Rehabilitation  SUBJECTIVE:                                                                                                                                                                                             SUBJECTIVE STATEMENT: Pt reports he feels good and like he progressing well  Pt accompanied by: self (he continues to drive himself)  PERTINENT HISTORY: degenerative disc disease (followed by [facility]) s/p L5-S1 surgery, HLD, HTN, prostate cancer  Patient's symptoms started in 2020 - right flank pain (reported initially as abdominal pain, but pt clarifies this is not a deep cramp/stomach pain, but a muscular soreness especially with movement), core weakness (not able to sit up well), sneezing and coughing was painful as well.  A year later he noted stiffness in his legs after sitting where he has to walk up his legs with his hands.  Now, he reports numbness and soreness in left hamstring area as well as swelling and muscle spasms (left worse).  In order to walk he has to weight shift and bring feet into narrowed BOS then start with small steps.  He loosens up with movement. He reports freezing when startled. Ambulates w/ SPC.  Upper body remains WNL.  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  Pt has a fall August 23, 2023 halting his prior PT episode due to broken ribs.  PAIN:  Are you having pain? No - "a little stiffness in my left leg"  PRECAUTIONS: Fall  RED FLAGS: None-pt does report more urinary urgency that he reports corresponds with the reported itch in his legs and low back, but he has discussed this with his physician (Dr. [name])  WEIGHT BEARING RESTRICTIONS: No  FALLS: Has patient fallen in last 6 months? Yes. 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  He modifies some homemaking tasks, but reports he can stand longer to do things like cooking.  PATIENT GOALS: Work on upper body strength  OBJECTIVE:   DIAGNOSTIC FINDINGS:  EMG on 05/14/23 showed an active left S1 radiculopathy (similar to prior EMG)  X-ray Bone Survey Met (11/06/2023):   IMPRESSION: No evidence of lytic or destructive bone lesion  X-ray Ribs Unilateral Right (09/04/2023): Radiographs of his right ribs demonstrate no more blunting of the  costophrenic angle he does have vascular markings going out to the  periphery.   COGNITION: Overall cognitive status: Within functional limits for tasks assessed and pt hyperverbal   SENSATION: Light touch: WFL  COORDINATION: LE RAMS:  slow and deliberate Bilateral Heel-to-shin:  limited by lack of bilateral ER  EDEMA:  bilateral ankle edema (baseline per pt - he has been using castor oil and lavender to improve skin integrity - MD aware of regimen)  MUSCLE TONE: None noted in BLE  POSTURE: weight shift right  LOWER EXTREMITY ROM:     Active  Right Eval Left Eval  Hip flexion Grossly WFL - limited bilateral ER (basically at neutral  Hip extension   Hip abduction   Hip adduction   Hip internal rotation   Hip external rotation   Knee flexion   Knee extension   Ankle dorsiflexion   Ankle plantarflexion    Ankle inversion    Ankle eversion     (Blank rows = not tested)  LOWER EXTREMITY MMT:    MMT  Right Eval Left Eval  Hip  flexion 3/5 3+/5  Hip extension    Hip abduction    Hip adduction    Hip internal rotation    Hip external rotation    Knee flexion    Knee extension 4+/5 4+/5  Ankle dorsiflexion 3+/5 4/5  Ankle plantarflexion    Ankle inversion    Ankle eversion    (Blank rows = not tested)  BED MOBILITY:  Pt reports his bed mobility has greatly improved and he has switched his setup so he enters and exits bed on side closest to his bathroom  TRANSFERS: Assistive device utilized: None  Sit to stand: Modified independence - uses hands Stand to sit: Modified independence Chair to chair: Complete Independence Floor:  pt performed modI on most recent fall  GAIT: Gait pattern: step through pattern, decreased step length- Right, decreased step length- Left, decreased stride length, decreased hip/knee flexion- Right, decreased hip/knee flexion- Left, lateral lean- Right, decreased trunk rotation, and narrow BOS Distance walked: various clinic distances Assistive device utilized: None Level of assistance: SBA Comments: No notable ataxia or involuntary movements during ambulation into/out of clinic.  FUNCTIONAL TESTS:  5 times sit to stand: 24.72 seconds w/ light BUE support - wide stance with some crouching into tall stand due to imbalance Timed up and go (TUG): To be assessed. 10 meter walk test: To be assessed.  PATIENT SURVEYS:  ABC scale To be assessed.  TODAY'S TREATMENT:                                                                                                                              01/08/24 Pt seen for aquatic therapy today.  Treatment took place in water 3.5-4.75 ft in depth at the [facility] pool. Temp of water was 91.  Pt entered/exited the pool via stairs with hand rail.   *walking forward, back and side stepping unsupported *side stepping ue add/abd yellow HB->side lunge *Side to side pendulums VC good challenge/engaging of QL. Using blue HB. Paused initially  for QL stretch *Front to back pendulum for ant and post core engagement 3-4 reps after instruction for execution *suspended prone ue flys 2 x 3 reps (difficult) *step ups leading R/L x10 *Runners step up/down on bottom step *BKTC stretch at ladder   Pt requires the buoyancy and hydrostatic pressure of water for support, and to offload joints by unweighting joint load by at least 50 % in navel deep water and by at least 75-80% in chest to neck deep water.  Viscosity of the water is needed for resistance of strengthening. Water current perturbations provides challenge to standing balance requiring increased core activation.   01/06/24 -NuStep x9 minutes progressing from level 4.0 to level 6.0 using BUE/BLE for large amplitude reciprocal mobility and cardiovascular challenge.  RPE 8/10 midway through task.  Avg steps/min 101. -Thoracic seated rotation stretch 2x30 sec each side > side bending stretch 3x10 seconds each side >  open chest hands at head x1 minute > contralateral elbow to knee crunch x10 each side > adductor squeeze to 6lb ball x10 w/ 3 sec squeeze > ball squeeze STS x10, pt drops balls on initial rep, but demonstrate improved coordination on remaining reps -UE and LE taps to alternating surface heights and target color w/ progressed sequence from single to 4 colors called for direction changing, reaction time, and SLS; no errors -3 color flat to 8" targets w/ pt alternating LE taps while verbalizing coordination item with target color for dual tasking and SLS  PATIENT EDUCATION: Education details: Continue HEP and chair yoga.  Continue walking program.  Progress noted for progress note today based on goals last visit. Person educated: Patient Education method: Explanation Education comprehension: verbalized understanding and needs further education  HOME EXERCISE PROGRAM: Review from episode prior - modify as needed:  Access Code: [email or web address]  You Can Walk For A Certain Length Of Time  Each Day (can start with 3-4 days and work up to everyday - use cane for safety and walk in well lit familiar areas with smooth surfaces)                          Walk 5 minutes 2 times per day.             Increase 2  minutes every 7 days              Work up to 20 minutes (1x per day).               Example:                         Day 1-2           4-5 minutes     3 times per day                         Day 7-8           10-12 minutes 2-3 times per day                         Day 13-14       20-22 minutes 1-2 times per day  Chair yoga poses 1-6, 8-9 (modified position 9 and held x10 seconds), 12  GOALS: Goals reviewed with patient? Yes  SHORT TERM GOALS: Target date: 12/27/2023  Pt will decrease 5xSTS to </=19.72 seconds with improved immediate standing balance in order to demonstrate decreased risk for falls and improved functional bilateral LE strength and power. Baseline:  24.72 seconds w/ light BUE support; 15.97 sec w/ BUE support (3/3) Goal status: MET  2.  Pt will demonstrate TUG of </=17.09 seconds in order to decrease risk of falls and improve functional mobility using LRAD. Baseline: 22.09 sec no AD SBA (2/3); 12.86 sec no AD SBA (3/3) Goal status: MET  3.  Pt will demonstrate a gait speed of >/=2.52 feet/sec in order to decrease risk for falls. Baseline: 2.32 ft/sec (2/3); 2.74 ft/sec (3/3) Goal status: MET  LONG TERM GOALS: Target date: 01/24/2024  Pt will be independent and compliant with strength, stretching, and balance focused land-based HEP and aquatic HEP if continuing following discharge in order to maintain functional progress and improve mobility. Baseline:  To be established. Goal status: INITIAL  2.  Pt will decrease 5xSTS to </=  14.72 seconds in order to demonstrate decreased risk for falls and improved functional bilateral LE strength and power. Baseline: 24.72 seconds w/ light BUE support Goal status: INITIAL  3.  Pt will demonstrate TUG of </=12.09  seconds in order to decrease risk of falls and improve functional mobility using LRAD. Baseline: 22.09 sec no AD SBA (2/3) Goal status: INITIAL  4.  Pt will demonstrate a gait speed of >/=2.92 feet/sec in order to decrease risk for falls. Baseline: 2.32 ft/sec (2/3); 2.74 ft/sec (3/3) Goal status: REVISED  5.  Pt will ambulate >/=800 feet at IND level over unlevel outdoor surfaces and grass to promote household and community access. Baseline: To be assessed. Goal status: INITIAL  6.  Pt will improve ABC Scale to >/= 69.4% in order to demonstrate improved fall risk and engagement in daily activities. Baseline: 59.4% (2/3) Goal status: INITIAL  ASSESSMENT:  CLINICAL IMPRESSION: Pt directed through high level aquatic exercises for core and balance with good toleration.  Some difficulty with execution requiring extra time for motor plan to develop.  Using large yellow noodle for hip flex stretch tolerated for optimal stretch without discomfort. Pt will benefit from a few more aquatic sessions to develop HEP.  Uncertain if pt will gain pool access. He will be ready soon to completely transition onto land based intervention adding load with all activities for muscle strengthening.    OBJECTIVE IMPAIRMENTS: decreased activity tolerance, decreased balance, decreased coordination, decreased endurance, decreased knowledge of condition, decreased mobility, difficulty walking, decreased strength, increased edema, and increased muscle spasms.   ACTIVITY LIMITATIONS: carrying, lifting, bending, sitting, standing, squatting, stairs, transfers, bed mobility, and locomotion level  PARTICIPATION LIMITATIONS: shopping, community activity, and yard work  PERSONAL FACTORS: Age, Fitness, and 1-2 comorbidities: HTN, ongoing S1 radiculopathy  are also affecting patient's functional outcome.   REHAB POTENTIAL: Excellent  CLINICAL DECISION MAKING: Evolving/moderate complexity  EVALUATION COMPLEXITY:  Moderate  PLAN:  PT FREQUENCY: 2x/week (1 land and 1 aquatic)  PT DURATION: 8 weeks  PLANNED INTERVENTIONS: 16109- PT Re-evaluation, 97110-Therapeutic exercises, 97530- Therapeutic activity, 97112- Neuromuscular re-education, 97535- Self Care, [id number]- Manual therapy, [phone]- Gait training, [phone]- Orthotic Fit/training, [phone]- Aquatic Therapy, Patient/Family education, Balance training, Stair training, Taping, Dry Needling, Joint mobilization, Spinal mobilization, Vestibular training, DME instructions, Moist heat, Therapeutic exercises, Therapeutic activity, Neuromuscular re-education, Gait training, and Self Care  PLAN FOR NEXT SESSION: Modify land HEP for core and LE strength, stretching, and balance.  SciFit for reciprocal mobility.  Reaction time - blaze pods, balloon taps on compliant surfaces, timed puzzles on compliant surface.   Upper body strength.  Eyes closed.  Hip flexor strength.  Treadmill training. Standing on incline board for static stretch and balance.  Walking stick on outdoor incline - staggered walking down decline - print resource for pt.  Standing hip flexor stretch.  Standing windmills vs modified w/ unilateral UE support.  R adductor strengthening  Aquatics:  pt may not need aquatic HEP - unsure of transition into community pool - may need a list of community options (land PT can provide copy at next appt if needed), balance - ai chi and general strength/stretching of core, shoulders and hips, upright posture, walking tolerance; PT anticipates pt should be able to manage stairs at no more than supervision level  Corrie Dandy Tomma Lightning) Rochell Puett MPT 01/08/24 3:32 PM [facility] [address] [place], [place], [address] Phone: [phone]   Fax:  [phone]

## 2024-01-13 ENCOUNTER — Encounter: Payer: Self-pay | Admitting: Physical Therapy

## 2024-01-13 ENCOUNTER — Ambulatory Visit: Payer: Medicare Other | Admitting: Physical Therapy

## 2024-01-13 DIAGNOSIS — R252 Cramp and spasm: Secondary | ICD-10-CM

## 2024-01-13 DIAGNOSIS — R2681 Unsteadiness on feet: Secondary | ICD-10-CM | POA: Diagnosis not present

## 2024-01-13 DIAGNOSIS — R600 Localized edema: Secondary | ICD-10-CM | POA: Diagnosis not present

## 2024-01-13 DIAGNOSIS — Z9181 History of falling: Secondary | ICD-10-CM | POA: Diagnosis not present

## 2024-01-13 DIAGNOSIS — M25662 Stiffness of left knee, not elsewhere classified: Secondary | ICD-10-CM

## 2024-01-13 DIAGNOSIS — M25652 Stiffness of left hip, not elsewhere classified: Secondary | ICD-10-CM

## 2024-01-13 DIAGNOSIS — M6281 Muscle weakness (generalized): Secondary | ICD-10-CM

## 2024-01-13 DIAGNOSIS — M25672 Stiffness of left ankle, not elsewhere classified: Secondary | ICD-10-CM

## 2024-01-13 DIAGNOSIS — M25675 Stiffness of left foot, not elsewhere classified: Secondary | ICD-10-CM

## 2024-01-13 DIAGNOSIS — R2689 Other abnormalities of gait and mobility: Secondary | ICD-10-CM

## 2024-01-13 NOTE — Therapy (Signed)
 OUTPATIENT PHYSICAL THERAPY NEURO TREATMENT   Patient Name: Michael [name] MRN: [id number] DOB:02/28/1956, 68 y.o., male Today's Date: 01/13/2024   PCP: [name], MD REFERRING PROVIDER: Persons, [place], [place]   END OF SESSION:  PT End of Session - 01/13/24 1410     Visit Number 12    Number of Visits 17   16 + eval   Date for PT Re-Evaluation 01/31/24   pushed out due to aquatic needs   Authorization Type [employer] MEDICARE    Progress Note Due on Visit 10    PT Start Time 1404    PT Stop Time 1449    PT Time Calculation (min) 45 min    Equipment Utilized During Treatment Gait belt    Activity Tolerance Patient tolerated treatment well    Behavior During Therapy WFL for tasks assessed/performed             Past Medical History:  Diagnosis Date   Hyperlipidemia    Hypertension    Lumbar disc disease    Prostate cancer (HCC)    Past Surgical History:  Procedure Laterality Date   LUMBAR DISC SURGERY  2014   LUMBAR DISC SURGERY     right ankle fracture  1978   external pinning   Patient Active Problem List   Diagnosis Date Noted   Multiple rib fractures 08/28/2023   MGUS (monoclonal gammopathy of unknown significance) 06/10/2023   RBC microcytosis 06/10/2023   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   DDD (degenerative disc disease), lumbar 12/21/2022   Prostate cancer (HCC); T1c; GG 1; initial diagnosis 2011 12/05/2022   Chronic hyperglycemia 11/16/2022   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   Contusion of ulnar nerve, initial encounter 11/09/2022   Dyslipidemia, goal LDL below 70 09/23/2022   Elevated PSA, between 10 and less than 20 ng/ml 09/17/2022   Accelerated hypertension 09/17/2022   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   Vitamin D deficiency 09/07/2021    ONSET DATE: symptoms began in 2020; pt had a fall August 23, 2023  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  He reports he feels good and has had no falls or other issues.  Pt accompanied by: self (he continues to drive himself)  PERTINENT HISTORY: degenerative disc disease (followed by [facility]) s/p L5-S1 surgery, HLD, HTN, prostate cancer  Patient's symptoms started in 2020 - right flank pain (reported initially as abdominal pain, but pt clarifies this is not a deep cramp/stomach pain, but a muscular soreness especially with movement), core weakness (not able to sit up well), sneezing and coughing was painful as well.  A year later he noted stiffness in his legs after sitting where he has to walk up his legs with his hands.  Now, he reports numbness and soreness in left hamstring area as well as swelling and muscle spasms (left worse).  In order to walk he has to weight shift and bring feet into narrowed BOS then start with small steps.  He loosens up with movement. He reports freezing when startled. Ambulates w/ SPC.  Upper body remains WNL.  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  Pt has a fall August 23, 2023 halting his prior PT episode due to broken ribs.  PAIN:  Are you having pain? No - "a little stiffness in my left leg"  PRECAUTIONS: Fall  RED  FLAGS: None-pt does report more urinary urgency that he reports corresponds with the reported itch in his legs and low back, but he has discussed this with his physician (Dr. [name])  WEIGHT BEARING RESTRICTIONS: No  FALLS: Has patient fallen in last 6 months? Yes. 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  He modifies some homemaking tasks, but reports he can stand longer to do things like cooking.  PATIENT GOALS: Work on upper body strength  OBJECTIVE:   DIAGNOSTIC FINDINGS:  EMG on 05/14/23 showed an active left S1 radiculopathy (similar to prior EMG)  X-ray Bone Survey Met (11/06/2023):   IMPRESSION: No evidence of lytic or destructive bone lesion  X-ray Ribs Unilateral Right (09/04/2023): Radiographs of his right ribs demonstrate no more blunting of the  costophrenic angle he does have vascular markings going out to the  periphery.   COGNITION: Overall cognitive status: Within functional limits for tasks assessed and pt hyperverbal   SENSATION: Light touch: WFL  COORDINATION: LE RAMS:  slow and deliberate Bilateral Heel-to-shin:  limited by lack of bilateral ER  EDEMA:  bilateral ankle edema (baseline per pt - he has been using castor oil and lavender to improve skin integrity - MD aware of regimen)  MUSCLE TONE: None noted in BLE  POSTURE: weight shift right  LOWER EXTREMITY ROM:     Active  Right Eval Left Eval  Hip flexion Grossly WFL - limited bilateral ER (basically at neutral  Hip  extension   Hip abduction   Hip adduction   Hip internal rotation   Hip external rotation   Knee flexion   Knee extension  Ankle dorsiflexion   Ankle plantarflexion    Ankle inversion    Ankle eversion     (Blank rows = not tested)  LOWER EXTREMITY MMT:    MMT Right Eval Left Eval  Hip flexion 3/5 3+/5  Hip extension    Hip abduction    Hip adduction    Hip internal rotation    Hip external rotation    Knee flexion    Knee extension 4+/5 4+/5  Ankle dorsiflexion 3+/5 4/5  Ankle plantarflexion    Ankle inversion    Ankle eversion    (Blank rows = not tested)  BED MOBILITY:  Pt reports his bed mobility has greatly improved and he has switched his setup so he enters and exits bed on side closest to his bathroom  TRANSFERS: Assistive device utilized: None  Sit to stand: Modified independence - uses hands Stand to sit: Modified independence Chair to chair: Complete Independence Floor:  pt performed modI on most recent fall  GAIT: Gait pattern: step through pattern, decreased step length- Right, decreased step length- Left, decreased stride length, decreased hip/knee flexion- Right, decreased hip/knee flexion- Left, lateral lean- Right, decreased trunk rotation, and narrow BOS Distance walked: various clinic distances Assistive device utilized: None Level of assistance: SBA Comments: No notable ataxia or involuntary movements during ambulation into/out of clinic.  FUNCTIONAL TESTS:  5 times sit to stand: 24.72 seconds w/ light BUE support - wide stance with some crouching into tall stand due to imbalance Timed up and go (TUG): To be assessed. 10 meter walk test: To be assessed.  PATIENT SURVEYS:  ABC scale To be assessed.  TODAY'S TREATMENT:                                                                                                                              01/13/24 -Treadmill training x10 minutes using BUE support SBA progressing to 1. and 6% for  global strengthening, fluidity of movement, and improved stride. -Standing hip flexor stretch 3x30 sec each side -Anteriorly oriented tilt board holding level > static achilles stretch 3x45 seconds > normal BOS eyes closed on tilt board 3 reps to fatigue > repeated on firm ground normal BOS; SBA-CGA -Walking unilateral UE support eyes closed 4x10 ft > walking march eyes closed w/ unilateral UE support 4x10 ft SBA -Backwards walking eyes closed unilateral UE support 4x10 ft SBA  PATIENT EDUCATION: Education details: Continue HEP w/ additions and chair yoga.  Continue walking program.  Person educated: Patient Education method: Explanation Education comprehension: verbalized understanding and needs further education  HOME EXERCISE PROGRAM: Review from episode prior - modify as needed:  Access Code: [email or web address]  You Can Walk For A Certain Length Of Time Each Day (can start with 3-4 days and work up to everyday - use cane for safety and walk in well lit familiar areas with smooth surfaces)  Walk 5 minutes 2 times per day.             Increase 2  minutes every 7 days              Work up to 20 minutes (1x per day).               Example:                         Day 1-2           4-5 minutes     3 times per day                         Day 7-8           10-12 minutes 2-3 times per day                         Day 13-14       20-22 minutes 1-2 times per day  Chair yoga poses 1-6, 8-9 (modified position 9 and held x10 seconds), 12  GOALS: Goals reviewed with patient? Yes  SHORT TERM GOALS: Target date: 12/27/2023  Pt will decrease 5xSTS to </=19.72 seconds with improved immediate standing balance in order to demonstrate decreased risk for falls and improved functional bilateral LE strength and power. Baseline:  24.72 seconds w/ light BUE support; 15.97 sec w/ BUE support (3/3) Goal status: MET  2.  Pt will demonstrate TUG of </=17.09 seconds in order to decrease risk of  falls and improve functional mobility using LRAD. Baseline: 22.09 sec no AD SBA (2/3); 12.86 sec no AD SBA (3/3) Goal status: MET  3.  Pt will demonstrate a gait speed of >/=2.52 feet/sec in order to decrease risk for falls. Baseline: 2.32 ft/sec (2/3); 2.74 ft/sec (3/3) Goal status: MET  LONG TERM GOALS: Target date: 01/24/2024  Pt will be independent and compliant with strength, stretching, and balance focused land-based HEP and aquatic HEP if continuing following discharge in order to maintain functional progress and improve mobility. Baseline:  To be established. Goal status: INITIAL  2.  Pt will decrease 5xSTS to </=14.72 seconds in order to demonstrate decreased risk for falls and improved functional bilateral LE strength and power. Baseline: 24.72 seconds w/ light BUE support Goal status: INITIAL  3.  Pt will demonstrate TUG of </=12.09 seconds in order to decrease risk of falls and improve functional mobility using LRAD. Baseline: 22.09 sec no AD SBA (2/3) Goal status: INITIAL  4.  Pt will demonstrate a gait speed of >/=2.92 feet/sec in order to decrease risk for falls. Baseline: 2.32 ft/sec (2/3); 2.74 ft/sec (3/3) Goal status: REVISED  5.  Pt will ambulate >/=800 feet at IND level over unlevel outdoor surfaces and grass to promote household and community access. Baseline: To be assessed. Goal status: INITIAL  6.  Pt will improve ABC Scale to >/= 69.4% in order to demonstrate improved fall risk and engagement in daily activities. Baseline: 59.4% (2/3) Goal status: INITIAL  ASSESSMENT:  CLINICAL IMPRESSION: Emphasis of skilled session on addressing stability in visually limited conditions.  Made additions to HEP to reflect challenging activities from session (walking eyes closed).  He continues to benefit from skilled PT in this setting to progress strength and balance.  PT planning to re-cert at last scheduled land appt.  Will reach out to aquatic therapist about ongoing  needs in this setting.  Continue per POC.  OBJECTIVE IMPAIRMENTS: decreased activity tolerance, decreased balance, decreased coordination, decreased endurance, decreased knowledge of condition, decreased mobility, difficulty walking, decreased strength, increased edema, and increased muscle spasms.   ACTIVITY LIMITATIONS: carrying, lifting, bending, sitting, standing, squatting, stairs, transfers, bed mobility, and locomotion level  PARTICIPATION LIMITATIONS: shopping, community activity, and yard work  PERSONAL FACTORS: Age, Fitness, and 1-2 comorbidities: HTN, ongoing S1 radiculopathy  are also affecting patient's functional outcome.   REHAB POTENTIAL: Excellent  CLINICAL DECISION MAKING: Evolving/moderate complexity  EVALUATION COMPLEXITY: Moderate  PLAN:  PT FREQUENCY: 2x/week (1 land and 1 aquatic)  PT DURATION: 8 weeks  PLANNED INTERVENTIONS: 09811- PT Re-evaluation, 97110-Therapeutic exercises, 97530- Therapeutic activity, 97112- Neuromuscular re-education, 97535- Self Care, 91478- Manual therapy, [phone]- Gait training, [phone]- Orthotic Fit/training, [phone]- Aquatic Therapy, Patient/Family education, Balance training, Stair training, Taping, Dry Needling, Joint mobilization, Spinal mobilization, Vestibular training, DME instructions, Moist heat, Therapeutic exercises, Therapeutic activity, Neuromuscular re-education, Gait training, and Self Care  PLAN FOR NEXT SESSION: Modify land HEP for core and LE strength, stretching, and balance.  SciFit for reciprocal mobility.  Reaction time - blaze pods, balloon taps on compliant surfaces, timed puzzles on compliant surface.   Upper body strength. Hip flexor strength.  Treadmill training. incline board balance.  Walking stick on outdoor incline - staggered walking down decline - print resource for pt.  Standing windmills vs modified w/ unilateral UE support.  R adductor strengthening  RE-CERT/LTG ASSESSMENT (aquatic visits?)  Aquatics:  pt may  not need aquatic HEP - unsure of transition into community pool - may need a list of community options (land PT can provide copy at next appt if needed), balance - ai chi and general strength/stretching of core, shoulders and hips, upright posture, walking tolerance; PT anticipates pt should be able to manage stairs at no more than supervision level  [name], PT, DPT 01/13/24 4:13 PM

## 2024-01-13 NOTE — Patient Instructions (Signed)
 Access Code: [email or web address] URL: [email or web address] Date: 01/13/2024 Prepared by: [name]  Exercises - Staggered Sit-to-Stand  - 1 x daily - 7 x weekly - 1 sets - 10 reps - Seated Hamstring Stretch  - 1 x daily - 4-5 x weekly - 1 sets - 2-3 reps - 1 minute hold - Gastroc Stretch on Wall  - 1 x daily - 4-5 x weekly - 1 sets - 3 reps - 45 seconds hold - Supine Figure 4 Piriformis Stretch  - 1 x daily - 4-5 x weekly - 1 sets - 2 reps - 1 minute hold - Supine Bridge  - 1 x daily - 7 x weekly - 1-2 sets - 10 reps - Supine Lower Trunk Rotation  - 1 x daily - 4 x weekly - 2 sets - 10 reps - Hooklying Single Knee to Chest Stretch  - 1 x daily - 5 x weekly - 1 sets - 2-3 reps - 45 seconds  to 1 minute hold - Sidelying Open Book Thoracic Lumbar Rotation and Extension  - 1 x daily - 4-5 x weekly - 2 sets - 10 reps - Bird Dog on Counter  - 1 x daily - 4-5 x weekly - 2 sets - 10 reps - Walking with Eyes Closed and Counter Support  - 1 x daily - 4-5 x weekly - 3 sets - 10 reps - Backward Walking with Eyes Closed and Counter Support  - 1 x daily - 4-5 x weekly - 3 sets - 10 reps

## 2024-01-15 ENCOUNTER — Ambulatory Visit (HOSPITAL_BASED_OUTPATIENT_CLINIC_OR_DEPARTMENT_OTHER): Payer: Medicare Other | Admitting: Physical Therapy

## 2024-01-15 ENCOUNTER — Encounter (HOSPITAL_BASED_OUTPATIENT_CLINIC_OR_DEPARTMENT_OTHER): Payer: Self-pay | Admitting: Physical Therapy

## 2024-01-15 DIAGNOSIS — R2681 Unsteadiness on feet: Secondary | ICD-10-CM

## 2024-01-15 DIAGNOSIS — M25652 Stiffness of left hip, not elsewhere classified: Secondary | ICD-10-CM

## 2024-01-15 DIAGNOSIS — M25662 Stiffness of left knee, not elsewhere classified: Secondary | ICD-10-CM

## 2024-01-15 NOTE — Therapy (Signed)
 OUTPATIENT PHYSICAL THERAPY NEURO TREATMENT   Patient Name: Michael [name] MRN: [id number] DOB:29-Apr-1956, 68 y.o., male Today's Date: 01/15/2024   PCP: [name], MD REFERRING PROVIDER: Persons, [place], [place]   END OF SESSION:  PT End of Session - 01/15/24 1448     Visit Number 13    Number of Visits 17   16 + eval   Date for PT Re-Evaluation 01/31/24   pushed out due to aquatic needs   Authorization Type [employer] MEDICARE    Progress Note Due on Visit 10    PT Start Time 1445    PT Stop Time 1525    PT Time Calculation (min) 40 min    Equipment Utilized During Treatment Gait belt    Activity Tolerance Patient tolerated treatment well    Behavior During Therapy WFL for tasks assessed/performed             Past Medical History:  Diagnosis Date   Hyperlipidemia    Hypertension    Lumbar disc disease    Prostate cancer (HCC)    Past Surgical History:  Procedure Laterality Date   LUMBAR DISC SURGERY  2014   LUMBAR DISC SURGERY     right ankle fracture  1978   external pinning   Patient Active Problem List   Diagnosis Date Noted   Multiple rib fractures 08/28/2023   MGUS (monoclonal gammopathy of unknown significance) 06/10/2023   RBC microcytosis 06/10/2023   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   DDD (degenerative disc disease), lumbar 12/21/2022   Prostate cancer (HCC); T1c; GG 1; initial diagnosis 2011 12/05/2022   Chronic hyperglycemia 11/16/2022   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   Contusion of ulnar nerve, initial encounter 11/09/2022   Dyslipidemia, goal LDL below 70 09/23/2022   Elevated PSA, between 10 and less than 20 ng/ml 09/17/2022   Accelerated hypertension 09/17/2022   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   Vitamin D deficiency 09/07/2021    ONSET DATE: symptoms began in 2020; pt had a fall August 23, 2023  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  THERAPY DIAG:  Unsteadiness on feet  Stiffness of left knee, not  elsewhere classified  Stiffness of left hip, not elsewhere classified  Rationale for Evaluation and Treatment: Rehabilitation  SUBJECTIVE:                                                                                                                                                                                             SUBJECTIVE STATEMENT: "Walked 1.5 miles after last land session with brother and did well, no residual pain or excessive fatigue"  Pt  accompanied by: self (he continues to drive himself)  PERTINENT HISTORY: degenerative disc disease (followed by [facility]) s/p L5-S1 surgery, HLD, HTN, prostate cancer  Patient's symptoms started in 2020 - right flank pain (reported initially as abdominal pain, but pt clarifies this is not a deep cramp/stomach pain, but a muscular soreness especially with movement), core weakness (not able to sit up well), sneezing and coughing was painful as well.  A year later he noted stiffness in his legs after sitting where he has to walk up his legs with his hands.  Now, he reports numbness and soreness in left hamstring area as well as swelling and muscle spasms (left worse).  In order to walk he has to weight shift and bring feet into narrowed BOS then start with small steps.  He loosens up with movement. He reports freezing when startled. Ambulates w/ SPC.  Upper body remains WNL.  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  Pt has a fall August 23, 2023 halting his prior PT episode due to broken ribs.  PAIN:  Are you having pain? No - "a little stiffness in my left leg"  PRECAUTIONS: Fall  RED FLAGS: None-pt does report more urinary urgency that he reports corresponds with the reported itch in his legs and low back, but he has discussed this with his physician (Dr. [name])  WEIGHT BEARING RESTRICTIONS: No  FALLS: Has patient fallen  in last 6 months? Yes. 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  He modifies some homemaking tasks, but reports he can stand longer to do things like cooking.  PATIENT GOALS: Work on upper body strength  OBJECTIVE:   DIAGNOSTIC FINDINGS:  EMG on 05/14/23 showed an active left S1 radiculopathy (similar to prior EMG)  X-ray Bone Survey Met (11/06/2023):   IMPRESSION: No evidence of lytic or destructive bone lesion  X-ray Ribs Unilateral Right (09/04/2023): Radiographs of his right ribs demonstrate no more blunting of the  costophrenic angle he does have vascular markings going out to the  periphery.   COGNITION: Overall cognitive status: Within functional limits for tasks assessed and pt hyperverbal   SENSATION: Light touch: WFL  COORDINATION: LE RAMS:  slow and deliberate Bilateral Heel-to-shin:  limited by lack of bilateral ER  EDEMA:  bilateral ankle edema (baseline per pt - he has been using castor oil and lavender to improve skin integrity - MD aware of regimen)  MUSCLE TONE: None noted in BLE  POSTURE: weight shift right  LOWER EXTREMITY ROM:     Active  Right Eval Left Eval  Hip flexion Grossly WFL - limited bilateral ER (basically at neutral  Hip extension   Hip abduction   Hip adduction   Hip internal rotation   Hip external rotation   Knee flexion   Knee extension   Ankle dorsiflexion   Ankle plantarflexion    Ankle inversion    Ankle eversion     (Blank rows = not  tested)  LOWER  EXTREMITY MMT:    MMT Right Eval Left Eval  Hip flexion 3/5 3+/5  Hip extension    Hip abduction    Hip adduction    Hip internal rotation    Hip external rotation    Knee flexion    Knee extension 4+/5 4+/5  Ankle dorsiflexion 3+/5 4/5  Ankle plantarflexion    Ankle inversion    Ankle eversion    (Blank rows = not tested)  BED MOBILITY:  Pt reports his bed mobility has greatly improved and he has switched his setup so he enters and exits bed on side closest to his bathroom  TRANSFERS: Assistive device utilized: None  Sit to stand: Modified independence - uses hands Stand to sit: Modified independence Chair to chair: Complete Independence Floor:  pt performed modI on most recent fall  GAIT: Gait pattern: step through pattern, decreased step length- Right, decreased step length- Left, decreased stride length, decreased hip/knee flexion- Right, decreased hip/knee flexion- Left, lateral lean- Right, decreased trunk rotation, and narrow BOS Distance walked: various clinic distances Assistive device utilized: None Level of assistance: SBA Comments: No notable ataxia or involuntary movements during ambulation into/out of clinic.  FUNCTIONAL TESTS:  5 times sit to stand: 24.72 seconds w/ light BUE support - wide stance with some crouching into tall stand due to imbalance Timed up and go (TUG): To be assessed. 10 meter walk test: To be assessed.  PATIENT SURVEYS:  ABC scale To be assessed.  TODAY'S TREATMENT:                                                                                                                              Pt seen for aquatic therapy today.  Treatment took place in water 3.5-4.75 ft in depth at the [facility] pool. Temp of water was 91.  Pt entered/exited the pool via stairs with hand rail.   *walking forward, back and side stepping unsupported *side lunges ue add/abd yellow HB multiple wodths *tandem stance 4.8 ft ue add/abd using yellow  HB *tandem stance ue support yellow HB with VE leading R/L x 15s *SLS 4.8 ue add/abd yellow HB (difficult-good challenge) *Runners step up/down on bottom step x 10 R/L-> onto 2nd step R/L x5 *STS from 3rd step (unable)-> from bench onto water step 10 with slow descent.  Cues for immediate standing balance  -completed  x5 with VE -TrA sets using yellow noodle pull downs wide stance then staggered 8-10rep (good challenge) -hip flex stretch using noodle  Pt requires the buoyancy and hydrostatic pressure of water for support, and to offload joints by unweighting joint load by at least 50 % in navel deep water and by at least 75-80% in chest to neck deep water.  Viscosity of the water is needed for resistance of strengthening. Water current perturbations provides challenge to standing balance requiring increased core activation.  PATIENT EDUCATION: Education details: Continue HEP w/ additions and  chair yoga.  Continue walking program.  Person educated: Patient Education method: Explanation Education comprehension: verbalized understanding and needs further education  HOME EXERCISE PROGRAM: Review from episode prior - modify as needed:  Access Code: [email or web address]  You Can Walk For A Certain Length Of Time Each Day (can start with 3-4 days and work up to everyday - use cane for safety and walk in well lit familiar areas with smooth surfaces)                          Walk 5 minutes 2 times per day.             Increase 2  minutes every 7 days              Work up to 20 minutes (1x per day).               Example:                         Day 1-2           4-5 minutes     3 times per day                         Day 7-8           10-12 minutes 2-3 times per day                         Day 13-14       20-22 minutes 1-2 times per day  Chair yoga poses 1-6, 8-9 (modified position 9 and held x10 seconds), 12  GOALS: Goals reviewed with patient? Yes  SHORT TERM GOALS: Target date: 12/27/2023  Pt will  decrease 5xSTS to </=19.72 seconds with improved immediate standing balance in order to demonstrate decreased risk for falls and improved functional bilateral LE strength and power. Baseline:  24.72 seconds w/ light BUE support; 15.97 sec w/ BUE support (3/3) Goal status: MET  2.  Pt will demonstrate TUG of </=17.09 seconds in order to decrease risk of falls and improve functional mobility using LRAD. Baseline: 22.09 sec no AD SBA (2/3); 12.86 sec no AD SBA (3/3) Goal status: MET  3.  Pt will demonstrate a gait speed of >/=2.52 feet/sec in order to decrease risk for falls. Baseline: 2.32 ft/sec (2/3); 2.74 ft/sec (3/3) Goal status: MET  LONG TERM GOALS: Target date: 01/24/2024  Pt will be independent and compliant with strength, stretching, and balance focused land-based HEP and aquatic HEP if continuing following discharge in order to maintain functional progress and improve mobility. Baseline:  To be established. Goal status: INITIAL  2.  Pt will decrease 5xSTS to </=14.72 seconds in order to demonstrate decreased risk for falls and improved functional bilateral LE strength and power. Baseline: 24.72 seconds w/ light BUE support Goal status: INITIAL  3.  Pt will demonstrate TUG of </=12.09 seconds in order to decrease risk of falls and improve functional mobility using LRAD. Baseline: 22.09 sec no AD SBA (2/3) Goal status: INITIAL  4.  Pt will demonstrate a gait speed of >/=2.92 feet/sec in order to decrease risk for falls. Baseline: 2.32 ft/sec (2/3); 2.74 ft/sec (3/3) Goal status: REVISED  5.  Pt will ambulate >/=800 feet at IND level over unlevel outdoor surfaces and grass to promote household and community access. Baseline: To be  assessed. Goal status: INITIAL  6.  Pt will improve ABC Scale to >/= 69.4% in order to demonstrate improved fall risk and engagement in daily activities. Baseline: 59.4% (2/3) Goal status: INITIAL  ASSESSMENT:  CLINICAL IMPRESSION: Progressed  addressing core engagement and stabilization in visually limited conditions with good toleration.Treatment highly effective with pt enjoying intervention. He will continue to benefit from skilled aquatic intervention for 2-3 more session to continue work on balance and core strengthening. Pt uncertain if he will have pool access to require an aquatic HEP.   OBJECTIVE IMPAIRMENTS: decreased activity tolerance, decreased balance, decreased coordination, decreased endurance, decreased knowledge of condition, decreased mobility, difficulty walking, decreased strength, increased edema, and increased muscle spasms.   ACTIVITY LIMITATIONS: carrying, lifting, bending, sitting, standing, squatting, stairs, transfers, bed mobility, and locomotion level  PARTICIPATION LIMITATIONS: shopping, community activity, and yard work  PERSONAL FACTORS: Age, Fitness, and 1-2 comorbidities: HTN, ongoing S1 radiculopathy  are also affecting patient's functional outcome.   REHAB POTENTIAL: Excellent  CLINICAL DECISION MAKING: Evolving/moderate complexity  EVALUATION COMPLEXITY: Moderate  PLAN:  PT FREQUENCY: 2x/week (1 land and 1 aquatic)  PT DURATION: 8 weeks  PLANNED INTERVENTIONS: 01027- PT Re-evaluation, 97110-Therapeutic exercises, 97530- Therapeutic activity, 97112- Neuromuscular re-education, 97535- Self Care, [id number]- Manual therapy, [phone]- Gait training, [phone]- Orthotic Fit/training, [phone]- Aquatic Therapy, Patient/Family education, Balance training, Stair training, Taping, Dry Needling, Joint mobilization, Spinal mobilization, Vestibular training, DME instructions, Moist heat, Therapeutic exercises, Therapeutic activity, Neuromuscular re-education, Gait training, and Self Care  PLAN FOR NEXT SESSION: Modify land HEP for core and LE strength, stretching, and balance.  SciFit for reciprocal mobility.  Reaction time - blaze pods, balloon taps on compliant surfaces, timed puzzles on compliant surface.   Upper  body strength. Hip flexor strength.  Treadmill training. incline board balance.  Walking stick on outdoor incline - staggered walking down decline - print resource for pt.  Standing windmills vs modified w/ unilateral UE support.  R adductor strengthening  RE-CERT/LTG ASSESSMENT (aquatic visits?)  Aquatics:  pt may not need aquatic HEP - unsure of transition into community pool - may need a list of community options (land PT can provide copy at next appt if needed), balance - ai chi and general strength/stretching of core, shoulders and hips, upright posture, walking tolerance; PT anticipates pt should be able to manage stairs at no more than supervision level  Corrie Dandy Tomma Lightning) Trevonn Hallum MPT 01/15/24 2:50 PM [facility] [address] [place], [place], [address] Phone: [phone]   Fax:  [phone]

## 2024-01-18 DIAGNOSIS — G2582 Stiff-man syndrome: Secondary | ICD-10-CM | POA: Diagnosis not present

## 2024-01-21 ENCOUNTER — Encounter: Payer: Self-pay | Admitting: Physical Therapy

## 2024-01-21 ENCOUNTER — Ambulatory Visit: Admitting: Physical Therapy

## 2024-01-21 DIAGNOSIS — M25652 Stiffness of left hip, not elsewhere classified: Secondary | ICD-10-CM | POA: Diagnosis not present

## 2024-01-21 DIAGNOSIS — M25662 Stiffness of left knee, not elsewhere classified: Secondary | ICD-10-CM | POA: Diagnosis not present

## 2024-01-21 DIAGNOSIS — R2681 Unsteadiness on feet: Secondary | ICD-10-CM | POA: Diagnosis not present

## 2024-01-21 DIAGNOSIS — M25672 Stiffness of left ankle, not elsewhere classified: Secondary | ICD-10-CM

## 2024-01-21 DIAGNOSIS — M6281 Muscle weakness (generalized): Secondary | ICD-10-CM

## 2024-01-21 DIAGNOSIS — R2689 Other abnormalities of gait and mobility: Secondary | ICD-10-CM | POA: Diagnosis not present

## 2024-01-21 DIAGNOSIS — Z9181 History of falling: Secondary | ICD-10-CM

## 2024-01-21 DIAGNOSIS — M25675 Stiffness of left foot, not elsewhere classified: Secondary | ICD-10-CM

## 2024-01-21 DIAGNOSIS — R600 Localized edema: Secondary | ICD-10-CM

## 2024-01-21 DIAGNOSIS — R252 Cramp and spasm: Secondary | ICD-10-CM | POA: Diagnosis not present

## 2024-01-21 NOTE — Therapy (Unsigned)
 OUTPATIENT PHYSICAL THERAPY NEURO TREATMENT   Patient Name: Michael [name] MRN: [id number] DOB:1956/08/16, 68 y.o., male Today's Date: 01/21/2024   PCP: [name], MD REFERRING PROVIDER: Persons, [place], [place]   END OF SESSION:  PT End of Session - 01/21/24 1525     Visit Number 14    Number of Visits 29   17 + 12   Date for PT Re-Evaluation 03/20/24   pushed out due to aquatic needs   Authorization Type [employer] MEDICARE    Progress Note Due on Visit 10    PT Start Time 1520    Equipment Utilized During Treatment Gait belt    Activity Tolerance Patient tolerated treatment well    Behavior During Therapy WFL for tasks assessed/performed             Past Medical History:  Diagnosis Date   Hyperlipidemia    Hypertension    Lumbar disc disease    Prostate cancer (HCC)    Past Surgical History:  Procedure Laterality Date   LUMBAR DISC SURGERY  2014   LUMBAR DISC SURGERY     right ankle fracture  1978   external pinning   Patient Active Problem List   Diagnosis Date Noted   Multiple rib fractures 08/28/2023   MGUS (monoclonal gammopathy of unknown significance) 06/10/2023   RBC microcytosis 06/10/2023   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   DDD (degenerative disc disease), lumbar 12/21/2022   Prostate cancer (HCC); T1c; GG 1; initial diagnosis 2011 12/05/2022   Chronic hyperglycemia 11/16/2022   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   Contusion of ulnar nerve, initial encounter 11/09/2022   Dyslipidemia, goal LDL below 70 09/23/2022   Elevated PSA, between 10 and less than 20 ng/ml 09/17/2022   Accelerated hypertension 09/17/2022   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   Vitamin D deficiency 09/07/2021    ONSET DATE: symptoms began in 2020; pt had a fall August 23, 2023  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  He denies any falls or issues during/after distance.  Pt accompanied by: self (he continues to drive himself)  PERTINENT HISTORY: degenerative disc disease (followed by [facility]) s/p L5-S1 surgery, HLD, HTN, prostate cancer  Patient's symptoms started in 2020 - right flank pain (reported initially as abdominal pain, but pt clarifies this is not a deep cramp/stomach pain, but a muscular soreness especially with movement), core weakness (not able to sit up well), sneezing and coughing was painful as well.  A year later he noted stiffness in his legs after sitting where he has to walk up his legs with his hands.  Now, he reports numbness and soreness in left hamstring area as well as swelling and muscle spasms (left worse).  In order to walk he has to weight shift and bring feet into narrowed BOS then start with small steps.  He loosens up with movement. He reports freezing when startled. Ambulates w/ SPC.  Upper body remains WNL.  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  Pt has a fall August 23, 2023 halting his prior PT episode due to broken ribs.  PAIN:  Are you having pain? No  PRECAUTIONS: Fall  RED FLAGS: None-pt does report more urinary urgency that  he reports corresponds with the reported itch in his legs and low back, but he has discussed this with his physician (Dr. [name])  WEIGHT BEARING RESTRICTIONS: No  FALLS: Has patient fallen in last 6 months? Yes. 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  He modifies some homemaking tasks, but reports he can stand longer to do things like cooking.  PATIENT GOALS: Work on upper body strength  OBJECTIVE:   DIAGNOSTIC FINDINGS:  EMG on 05/14/23 showed an active left S1 radiculopathy (similar to prior EMG)  X-ray Bone Survey Met (11/06/2023):   IMPRESSION: No evidence of lytic or destructive bone lesion  X-ray Ribs Unilateral Right (09/04/2023): Radiographs of his right ribs demonstrate no more blunting of the  costophrenic angle he does have vascular markings going out to the  periphery.   COGNITION: Overall cognitive status: Within functional limits for tasks assessed and pt hyperverbal   SENSATION: Light touch: WFL  COORDINATION: LE RAMS:  slow and deliberate Bilateral Heel-to-shin:  limited by lack of bilateral ER  EDEMA:  bilateral ankle edema (baseline per pt - he has been using castor oil and lavender to improve skin integrity - MD aware of regimen)  MUSCLE TONE: None noted in BLE  POSTURE: weight shift right  LOWER EXTREMITY ROM:     Active  Right Eval Left Eval  Hip flexion Grossly WFL - limited bilateral ER (basically at neutral  Hip extension   Hip abduction   Hip adduction   Hip internal  rotation   Hip external rotation   Knee flexion   Knee extension  Ankle dorsiflexion   Ankle plantarflexion    Ankle inversion    Ankle eversion     (Blank rows = not tested)  LOWER EXTREMITY MMT:    MMT Right Eval Left Eval  Hip flexion 3/5 3+/5  Hip extension    Hip abduction    Hip adduction    Hip internal rotation    Hip external rotation    Knee flexion    Knee extension 4+/5 4+/5  Ankle dorsiflexion 3+/5 4/5  Ankle plantarflexion    Ankle inversion    Ankle eversion    (Blank rows = not tested)  BED MOBILITY:  Pt reports his bed mobility has greatly improved and he has switched his setup so he enters and exits bed on side closest to his bathroom  TRANSFERS: Assistive device utilized: None  Sit to stand: Modified independence - uses hands Stand to sit: Modified independence Chair to chair: Complete Independence Floor:  pt performed modI on most recent fall  GAIT: Gait pattern: step through pattern, decreased step length- Right, decreased step length- Left, decreased stride length, decreased hip/knee flexion- Right, decreased hip/knee flexion- Left, lateral lean- Right, decreased trunk rotation, and narrow BOS Distance walked: various clinic distances Assistive device utilized: None Level of assistance: SBA Comments: No notable ataxia or involuntary movements during ambulation into/out of clinic.  FUNCTIONAL TESTS:  5 times sit to stand: 24.72 seconds w/ light BUE support - wide stance with some crouching into tall stand due to imbalance Timed up and go (TUG): To be assessed. 10 meter walk test: To be assessed.  PATIENT SURVEYS:  ABC scale To be assessed.  TODAY'S TREATMENT:                                                                                                                              -Verbally reviewed HEP/walking program - discussed goal for aquatic HEP establishment (he and aquatic therapist continue to discuss this and land PT to establish  goal for this as appropriate).  Pt reports no issues with current HEP and walking program, feels he is able to do these on his own, does not need reprint today.  He discusses recent attempts to progress his aerobic tolerance with increased distance of his walking. -5xSTS:  16.47 sec w/ intermittent touch to initiate stand -TUG:  12.22 sec IND - :  11.56 sec IND = 0.87 m/sec OR 2.85 ft/sec -ABC Scale: 98.94%; pt verbalizes ongoing challenges and improvements with daily tasks like getting in/out of car and he and PT discuss stretches to prioritize and way to improve this. -Pt ambulates 1000 ft over unlevel and grass IND, slowed pace, able to IND manage curb step/incline/decline; PT and pt discuss progression of his posture, left leg swing phase, and general management of variable surfaces.  PATIENT EDUCATION: Education details: Continue HEP w/ additions and chair yoga.  Continue walking program.  Process for Re-cert today and changes to POC concerning aquatics.  Progress  towards goals and planned goal modifications. Person educated: Patient Education method: Explanation Education comprehension: verbalized understanding and needs further education  HOME EXERCISE PROGRAM: Review from episode prior - modify as needed:  Access Code: [email or web address]  You Can Walk For A Certain Length Of Time Each Day (can start with 3-4 days and work up to everyday - use cane for safety and walk in well lit familiar areas with smooth surfaces)                          Walk 5 minutes 2 times per day.             Increase 2  minutes every 7 days              Work up to 20 minutes (1x per day).               Example:                         Day 1-2           4-5 minutes     3 times per day                         Day 7-8           10-12 minutes 2-3 times per day                         Day 13-14       20-22 minutes 1-2 times per day  Chair yoga poses 1-6, 8-9 (modified position 9 and held x10 seconds),  12  GOALS: Goals reviewed with patient? Yes  SHORT TERM GOALS: Target date: 12/27/2023  Pt will decrease 5xSTS to </=19.72 seconds with improved immediate standing balance in order to demonstrate decreased risk for falls and improved functional bilateral LE strength and power. Baseline:  24.72 seconds w/ light BUE support; 15.97 sec w/ BUE support (3/3) Goal status: MET  2.  Pt will demonstrate TUG of </=17.09 seconds in order to decrease risk of falls and improve functional mobility using LRAD. Baseline: 22.09 sec no AD SBA (2/3); 12.86 sec no AD SBA (3/3) Goal status: MET  3.  Pt will demonstrate a gait speed of >/=2.52 feet/sec in order to decrease risk for falls. Baseline: 2.32 ft/sec (2/3); 2.74 ft/sec (3/3) Goal status: MET  LONG TERM GOALS: Target date: 01/24/2024  Pt will be independent and compliant with strength, stretching, and balance focused land-based HEP and aquatic HEP if continuing following discharge in order to maintain functional progress and improve mobility. Baseline:  IND/compliant per report (3/25) Goal status: MET  2.  Pt will decrease 5xSTS to </=14.72 seconds in order to demonstrate decreased risk for falls and improved functional bilateral LE strength and power. Baseline: 24.72 seconds w/ light BUE support; 16.47 sec w/ intermittent touch to initiate stand (3/25) Goal status: PARTIALLY MET  3.  Pt will demonstrate TUG of </=12.09 seconds in order to decrease risk of falls and improve functional mobility using LRAD. Baseline: 22.09 sec no AD SBA (2/3); 12.22 sec IND (3/25) Goal status: PARTIALLY MET  4.  Pt will demonstrate a gait speed of >/=2.92 feet/sec in order to decrease risk for falls. Baseline: 2.32 ft/sec (2/3); 2.74 ft/sec (3/3); 2.85 ft/sec (3/25) Goal status: PARTIALLY MET  5.  Pt will ambulate >/=800  feet at IND level over unlevel outdoor surfaces and grass to promote household and community access. Baseline: 1000 ft over unlevel and grass  IND (3/25) Goal status: MET  6.  Pt will improve ABC Scale to >/= 69.4% in order to demonstrate improved fall risk and engagement in daily activities. Baseline: 59.4% (2/3); 98.94% (3/25) Goal status: MET  GOALS *** : Goals reviewed with patient? Yes  SHORT TERM GOALS: Target date: 12/27/2023 ***  Pt will decrease 5xSTS to </=19.72 seconds with improved immediate standing balance in order to demonstrate decreased risk for falls and improved functional bilateral LE strength and power. Baseline:  24.72 seconds w/ light BUE support; 15.97 sec w/ BUE support (3/3) Goal status: MET  2.  Pt will demonstrate TUG of </=17.09 seconds in order to decrease risk of falls and improve functional mobility using LRAD. Baseline: 22.09 sec no AD SBA (2/3); 12.86 sec no AD SBA (3/3) Goal status: MET  3.  Pt will demonstrate a gait speed of >/=2.52 feet/sec in order to decrease risk for falls. Baseline: 2.32 ft/sec (2/3); 2.74 ft/sec (3/3) Goal status: MET  LONG TERM GOALS: Target date: 01/24/2024 ***  Pt will be independent and compliant with strength, stretching, and balance focused land-based HEP and aquatic HEP if continuing following discharge in order to maintain functional progress and improve mobility. Baseline:  To be established. Goal status: INITIAL  2.  Pt will decrease 5xSTS to </=14.72 seconds in order to demonstrate decreased risk for falls and improved functional bilateral LE strength and power. Baseline: 24.72 seconds w/ light BUE support Goal status: INITIAL  3.  Pt will demonstrate TUG of </=10 seconds in order to decrease risk of falls and improve functional mobility using LRAD. Baseline: 22.09 sec no AD SBA (2/3); 12.22 sec IND (3/25) Goal status: REVISED  4.  Pt will demonstrate a gait speed of >/=2.92 feet/sec in order to decrease risk for falls. Baseline: 2.32 ft/sec (2/3); 2.74 ft/sec (3/3) Goal status: REVISED  5.  Pt will ambulate >/=800 feet at IND level over unlevel  outdoor surfaces and grass to promote household and community access. Baseline: To be assessed. Goal status: INITIAL  6.  Pt will improve ABC Scale to >/= 69.4% in order to demonstrate improved fall risk and engagement in daily activities. Baseline: 59.4% (2/3) Goal status: INITIAL  ASSESSMENT:  CLINICAL IMPRESSION: Progressed addressing core engagement and stabilization in visually limited conditions with good toleration.Treatment highly effective with pt enjoying intervention. He will continue to benefit from skilled aquatic intervention for 2-3 more session to continue work on balance and core strengthening. Pt uncertain if he will have pool access to require an aquatic HEP.   OBJECTIVE IMPAIRMENTS: decreased activity tolerance, decreased balance, decreased coordination, decreased endurance, decreased knowledge of condition, decreased mobility, difficulty walking, decreased strength, increased edema, and increased muscle spasms.   ACTIVITY LIMITATIONS: carrying, lifting, bending, sitting, standing, squatting, stairs, transfers, bed mobility, and locomotion level  PARTICIPATION LIMITATIONS: shopping, community activity, and yard work  PERSONAL FACTORS: Age, Fitness, and 1-2 comorbidities: HTN, ongoing S1 radiculopathy  are also affecting patient's functional outcome.   REHAB POTENTIAL: Excellent  CLINICAL DECISION MAKING: Evolving/moderate complexity  EVALUATION COMPLEXITY: Moderate  PLAN:  PT FREQUENCY: 2x/week (1 land and 1 aquatic for initial 3 weeks of re-cert)  PT DURATION: 8 weeks + 6 weeks (1/47 re-cert)  PLANNED INTERVENTIONS: 82956- PT Re-evaluation, 97110-Therapeutic exercises, 97530- Therapeutic activity, 97112- Neuromuscular re-education, 97535- Self Care, 21308- Manual therapy, [id number]- Gait training, [phone]- Orthotic Fit/training,  21308- Aquatic Therapy, Patient/Family education, Balance training, Stair training, Taping, Dry Needling, Joint mobilization, Spinal  mobilization, Vestibular training, DME instructions, Moist heat, Therapeutic exercises, Therapeutic activity, Neuromuscular re-education, Gait training, and Self Care  PLAN FOR NEXT SESSION: Modify land HEP for core and LE strength, stretching, and balance.  SciFit for reciprocal mobility.  Reaction time - blaze pods, balloon taps on compliant surfaces, timed puzzles on compliant surface.   Upper body strength. Hip flexor strength.  Treadmill training. incline board balance.  Walking stick on outdoor incline - staggered walking down decline - print resource for pt.  Standing windmills vs modified w/ unilateral UE support.  R adductor strengthening  RE-CERT/LTG ASSESSMENT (aquatic visits?)  Aquatics:  pt may not need aquatic HEP - unsure of transition into community pool - may need a list of community options (land PT can provide copy at next appt if needed), balance - ai chi and general strength/stretching of core, shoulders and hips, upright posture, walking tolerance; PT anticipates pt should be able to manage stairs at no more than supervision level  [name], PT, DPT

## 2024-01-22 ENCOUNTER — Ambulatory Visit (INDEPENDENT_AMBULATORY_CARE_PROVIDER_SITE_OTHER): Payer: Medicare Other

## 2024-01-22 ENCOUNTER — Encounter: Payer: Self-pay | Admitting: Internal Medicine

## 2024-01-22 ENCOUNTER — Ambulatory Visit (INDEPENDENT_AMBULATORY_CARE_PROVIDER_SITE_OTHER): Payer: Medicare Other | Admitting: Internal Medicine

## 2024-01-22 VITALS — BP 166/74 | HR 57 | Temp 98.4°F | Resp 16 | Ht 75.0 in | Wt 234.6 lb

## 2024-01-22 VITALS — Ht 75.0 in | Wt 233.0 lb

## 2024-01-22 DIAGNOSIS — I493 Ventricular premature depolarization: Secondary | ICD-10-CM

## 2024-01-22 DIAGNOSIS — I10 Essential (primary) hypertension: Secondary | ICD-10-CM | POA: Diagnosis not present

## 2024-01-22 DIAGNOSIS — R001 Bradycardia, unspecified: Secondary | ICD-10-CM

## 2024-01-22 DIAGNOSIS — Z1211 Encounter for screening for malignant neoplasm of colon: Secondary | ICD-10-CM | POA: Insufficient documentation

## 2024-01-22 DIAGNOSIS — E785 Hyperlipidemia, unspecified: Secondary | ICD-10-CM | POA: Diagnosis not present

## 2024-01-22 DIAGNOSIS — Z23 Encounter for immunization: Secondary | ICD-10-CM | POA: Insufficient documentation

## 2024-01-22 DIAGNOSIS — R739 Hyperglycemia, unspecified: Secondary | ICD-10-CM

## 2024-01-22 DIAGNOSIS — Z0001 Encounter for general adult medical examination with abnormal findings: Secondary | ICD-10-CM | POA: Insufficient documentation

## 2024-01-22 DIAGNOSIS — E559 Vitamin D deficiency, unspecified: Secondary | ICD-10-CM

## 2024-01-22 DIAGNOSIS — C61 Malignant neoplasm of prostate: Secondary | ICD-10-CM

## 2024-01-22 DIAGNOSIS — D539 Nutritional anemia, unspecified: Secondary | ICD-10-CM

## 2024-01-22 DIAGNOSIS — R972 Elevated prostate specific antigen [PSA]: Secondary | ICD-10-CM

## 2024-01-22 DIAGNOSIS — Z0000 Encounter for general adult medical examination without abnormal findings: Secondary | ICD-10-CM

## 2024-01-22 MED ORDER — BOOSTRIX 5-2.5-18.5 LF-MCG/0.5 IM SUSP: 0.5000 mL | Freq: Once | INTRAMUSCULAR | 0 refills | Status: AC

## 2024-01-22 NOTE — Progress Notes (Signed)
 Subjective:   Michael [name] is a 68 y.o. who presents for a Medicare Wellness preventive visit.  Visit Complete: Virtual I connected with  Michael [name] on 01/22/24 by a video and audio enabled telemedicine application and verified that I am speaking with the correct person using two identifiers.  Patient Location: Home  Provider Location: Home Office  I discussed the limitations of evaluation and management by telemedicine. The patient expressed understanding and agreed to proceed.  Vital Signs: Because this visit was a virtual/telehealth visit, some criteria may be missing or patient reported. Any vitals not documented were not able to be obtained and vitals that have been documented are patient reported.   Persons Participating in Visit: Patient.  AWV Questionnaire: No: Patient Medicare AWV questionnaire was not completed prior to this visit.  Cardiac Risk Factors include: advanced age (>9men, >18 women);male gender;hypertension;dyslipidemia     Objective:    Today's Vitals   01/22/24 1048  Weight: 233 lb (105.7 kg)  Height: 6\' 3"  (1.905 m)   Body mass index is 29.12 kg/m.     01/22/2024   11:05 AM 11/25/2023    2:10 PM 11/21/2023   11:40 AM 08/21/2023    2:09 PM 06/25/2023    2:58 PM 05/29/2023    3:21 PM 05/03/2023    9:23 AM  Advanced Directives  Does Patient Have a Medical Advance Directive? No No Yes No No No No  Type of Surveyor, minerals;Living will      Would patient like information on creating a medical advance directive?  No - Patient declined  Yes (Inpatient - patient defers creating a medical advance directive at this time - Information given) No - Patient declined      Current Medications (verified) Outpatient Encounter Medications as of 01/22/2024  Medication Sig   diazepam (VALIUM) 5 MG tablet Take 1 tablet (5 mg total) by mouth every 6 (six) hours as needed for anxiety.   GAMUNEX-C 5 GM/50ML SOLN    GARLIC PO  Take by mouth daily.   hydrALAZINE (APRESOLINE) 25 MG tablet Take 1 tablet (25 mg total) by mouth 3 (three) times daily. (Patient not taking: Reported on 01/22/2024)   triamterene-hydrochlorothiazide (DYAZIDE) 37.5-25 MG capsule Take 1 each (1 capsule total) by mouth daily. (Patient not taking: Reported on 01/22/2024)   No facility-administered encounter medications on file as of 01/22/2024.    Allergies (verified) Patient has no known allergies.   History: Past Medical History:  Diagnosis Date   Hyperlipidemia    Hypertension    Lumbar disc disease    Prostate cancer (HCC)    Past Surgical History:  Procedure Laterality Date   LUMBAR DISC SURGERY  2014   LUMBAR DISC SURGERY     right ankle fracture  1978   external pinning   Family History  Problem Relation Age of Onset   High blood pressure Mother    Social History   Socioeconomic History   Marital status: Divorced    Spouse name: Not on file   Number of children: 2   Years of education: Not on file   Highest education level: Some college, no degree  Occupational History   Occupation: RETIRED  Tobacco Use   Smoking status: Never    Passive exposure: Never   Smokeless tobacco: Never  Vaping Use   Vaping status: Never Used  Substance and Sexual Activity   Alcohol use: Not Currently    Comment: rare  Drug use: No   Sexual activity: Yes  Other Topics Concern   Not on file  Social History Narrative   Right Handed    Lives in a two story home.   Are you right handed or left handed? Right    Are you currently employed ?    What is your current occupation? retired   Do you live at home alone? no   Who lives with you? Mother 2025   What type of home do you live in: 1 story or 2 story? two       Social Drivers of Corporate investment banker Strain: Low Risk  (01/22/2024)   Overall Financial Resource Strain (CARDIA)    Difficulty of Paying Living Expenses: Not very hard  Food Insecurity: No Food Insecurity  (01/22/2024)   Hunger Vital Sign    Worried About Running Out of Food in the Last Year: Never true    Ran Out of Food in the Last Year: Never true  Transportation Needs: No Transportation Needs (01/22/2024)   PRAPARE - Administrator, Civil Service (Medical): No    Lack of Transportation (Non-Medical): No  Recent Concern: Transportation Needs - Unmet Transportation Needs (01/21/2024)   PRAPARE - Transportation    Lack of Transportation (Medical): Yes    Lack of Transportation (Non-Medical): Yes  Physical Activity: Insufficiently Active (01/22/2024)   Exercise Vital Sign    Days of Exercise per Week: 2 days    Minutes of Exercise per Session: 60 min  Stress: No Stress Concern Present (01/22/2024)   [employer] of Occupational Health - Occupational Stress Questionnaire    Feeling of Stress : Not at all  Social Connections: Socially Isolated (01/22/2024)   Social Connection and Isolation Panel [NHANES]    Frequency of Communication with Friends and Family: More than three times a week    Frequency of Social Gatherings with Friends and Family: Twice a week    Attends Religious Services: Never    Database administrator or Organizations: No    Attends Engineer, structural: Never    Marital Status: Divorced    Tobacco Counseling Counseling given: Not Answered    Clinical Intake:  Pre-visit preparation completed: Yes  Pain : No/denies pain     BMI - recorded: 29.12 Nutritional Status: BMI 25 -29 Overweight Nutritional Risks: None Diabetes: No  Lab Results  Component Value Date   HGBA1C 5.9 11/22/2022     How often do you need to have someone help you when you read instructions, pamphlets, or other written materials from your doctor or pharmacy?: 1 - Never  Interpreter Needed?: No  Information entered by :: Sindia Kowalczyk, RMA   Activities of Daily Living     01/22/2024   10:48 AM  In your present state of health, do you have any difficulty  performing the following activities:  Hearing? 0  Vision? 0  Difficulty concentrating or making decisions? 0  Walking or climbing stairs? 0  Dressing or bathing? 0  Doing errands, shopping? 0  Preparing Food and eating ? N  Using the Toilet? N  In the past six months, have you accidently leaked urine? N  Do you have problems with loss of bowel control? N  Managing your Medications? N  Managing your Finances? N  Housekeeping or managing your Housekeeping? N    Patient Care Team: [name], MD as PCP - General (Internal Medicine) Antony Madura, MD as Consulting Physician (  Neurology)  Indicate any recent Medical Services you may have received from other than [facility] providers in the past year (date may be approximate).     Assessment:   This is a routine wellness examination for Michael [name].  Hearing/Vision screen Hearing Screening - Comments:: Denies hearing difficulties   Vision Screening - Comments:: Wears eyeglasses   Goals Addressed               This Visit's Progress     Patient Stated (pt-stated)        Would like to continue to have PT.       Depression Screen     01/22/2024   11:21 AM 08/26/2023    2:15 PM 01/21/2023   10:44 AM 04/20/2022    3:09 PM 12/05/2021    2:50 PM  PHQ 2/9 Scores  PHQ - 2 Score 0 0 0 0 2  PHQ- 9 Score 0    4    Fall Risk     01/22/2024   11:05 AM 11/21/2023   11:40 AM 08/26/2023    2:15 PM 08/21/2023    2:31 PM 05/29/2023    3:21 PM  Fall Risk   Falls in the past year? 1 0 1 0 1  Number falls in past yr: 1 0 0 0 0  Injury with Fall? 0 0 1 0 1  Risk for fall due to : No Fall Risks  Impaired balance/gait    Follow up Falls prevention discussed;Falls evaluation completed Falls evaluation completed Falls evaluation completed Falls evaluation completed Falls evaluation completed    MEDICARE RISK AT HOME:  Medicare Risk at Home Any stairs in or around the home?: Yes If so, are there any without handrails?: Yes Home free of  loose throw rugs in walkways, pet beds, electrical cords, etc?: Yes Adequate lighting in your home to reduce risk of falls?: Yes Life alert?: No Use of a cane, walker or w/c?: No Grab bars in the bathroom?: Yes Shower chair or bench in shower?: Yes Elevated toilet seat or a handicapped toilet?: Yes  TIMED UP AND GO:  Was the test performed?  No  Cognitive Function: 6CIT completed        01/22/2024   11:07 AM  6CIT Screen  What Year? 0 points  What month? 0 points  What time? 0 points  Count back from 20 0 points  Months in reverse 2 points  Repeat phrase 0 points  Total Score 2 points    Immunizations Immunization History  Administered Date(s) Administered   Influenza,inj,Quad PF,6+ Mos 11/15/2022   PFIZER(Purple Top)SARS-COV-2 Vaccination 02/02/2020, 02/23/2020, 06/29/2020    Screening Tests Health Maintenance  Topic Date Due   DTaP/Tdap/Td (1 - Tdap) Never done   Zoster Vaccines- Shingrix (1 of 2) Never done   Pneumonia Vaccine 3+ Years old (1 of 1 - PCV) Never done   COVID-19 Vaccine (4 - 2024-25 season) 06/30/2023   INFLUENZA VACCINE  01/27/2024 (Originally 05/30/2023)   Medicare Annual Wellness (AWV)  01/21/2025   Colonoscopy  08/13/2028   Hepatitis C Screening  Completed   HPV VACCINES  Aged Out    Health Maintenance  Health Maintenance Due  Topic Date Due   DTaP/Tdap/Td (1 - Tdap) Never done   Zoster Vaccines- Shingrix (1 of 2) Never done   Pneumonia Vaccine 69+ Years old (1 of 1 - PCV) Never done   COVID-19 Vaccine (4 - 2024-25 season) 06/30/2023   Health Maintenance Items Addressed: See Nurse Notes  Additional Screening:  Vision Screening: Recommended annual ophthalmology exams for early detection of glaucoma and other disorders of the eye.  Dental Screening: Recommended annual dental exams for proper oral hygiene  Community Resource Referral / Chronic Care Management: CRR required this visit?  No   CCM required this visit?  No      Plan:     I have personally reviewed and noted the following in the patient's chart:   Medical and social history Use of alcohol, tobacco or illicit drugs  Current medications and supplements including opioid prescriptions. Patient is not currently taking opioid prescriptions. Functional ability and status Nutritional status Physical activity Advanced directives List of other physicians Hospitalizations, surgeries, and ER visits in previous 12 months Vitals Screenings to include cognitive, depression, and falls Referrals and appointments  In addition, I have reviewed and discussed with patient certain preventive protocols, quality metrics, and best practice recommendations. A written personalized care plan for preventive services as well as general preventive health recommendations were provided to patient.     Geana Walts L Malayna Noori, CMA   01/22/2024   After Visit Summary: (MyChart) Due to this being a telephonic visit, the after visit summary with patients personalized plan was offered to patient via MyChart   Notes: Please refer to Routing Comments.

## 2024-01-22 NOTE — Progress Notes (Signed)
 Subjective:  Patient ID: Michael [name], male    DOB: 29-May-1956  Age: 68 y.o. MRN: [id number]  CC: Annual Exam, Anemia, Hypertension, and Hyperlipidemia   HPI Michael [name] presents for a CPX and f/up ---  Discussed the use of AI scribe software for clinical note transcription with the patient, who gave verbal consent to proceed.  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 He experiences difficulty suppressing sneezes or coughs without pain, though these symptoms have improved. Rigorous physical therapy, both land and water-based, has strengthened his core and improved his mobility, allowing him to walk without dragging his legs and reducing the risk of tripping.  He is currently taking diazepam, initially prescribed at 5 mg four times a day, which significantly improved his mobility and confidence in navigating stairs and uneven surfaces. However, he feels lethargic as a side effect of the medication.  He receives a monthly infusion of a medication. His last infusion was on the past Saturday, and he has one more appointment scheduled for this treatment.  He mentions a past episode of anemia related to low iron levels, which has since resolved. No current blood loss or symptoms related to anemia.  He has a history of a prostate biopsy in 2011, which was inconclusive, and denies any current treatment for prostate cancer.  No headaches, blurred vision, chest pain, shortness of breath, or swelling in his legs or feet. No blood in stool.        Outpatient Medications Prior to Visit  Medication Sig Dispense Refill   diazepam (VALIUM) 5 MG tablet Take 1 tablet (5 mg total) by mouth every 6 (six) hours as needed for anxiety. 30 tablet 5    GAMUNEX-C 5 GM/50ML SOLN      GARLIC PO Take by mouth daily.     hydrALAZINE (APRESOLINE) 25 MG tablet Take 1 tablet (25 mg total) by mouth 3 (three) times daily. 270 tablet 0   triamterene-hydrochlorothiazide (DYAZIDE) 37.5-25 MG capsule Take 1 each (1 capsule total) by mouth daily. 90 capsule 0   No facility-administered medications prior to visit.    ROS Review of Systems  Constitutional:  Negative for appetite change, chills, diaphoresis, fatigue and fever.  HENT: Negative.  Negative for trouble swallowing.   Eyes: Negative.   Respiratory: Negative.  Negative for cough, chest tightness, shortness of breath and wheezing.   Cardiovascular:  Negative for chest pain, palpitations and leg swelling.  Gastrointestinal: Negative.  Negative for abdominal pain, blood in stool, constipation, diarrhea, nausea and vomiting.  Endocrine: Negative.   Genitourinary: Negative.  Negative for difficulty urinating, dysuria, penile swelling, scrotal swelling and testicular pain.  Musculoskeletal:  Positive for arthralgias and gait problem. Negative for back pain, myalgias and neck stiffness.  Neurological:  Negative for dizziness, weakness, light-headedness, numbness and headaches.  Hematological:  Negative for adenopathy. Does not bruise/bleed easily.  Psychiatric/Behavioral:  Positive for confusion and decreased concentration. Negative for behavioral problems, self-injury, sleep disturbance and suicidal ideas. The patient is not hyperactive.            Objective:  BP (!) 166/74 (BP Location: Right Arm, Patient Position: Sitting, Cuff Size: Large) Comment: BP (L) 154/74  Pulse (!) 57   Temp 98.4 F (36.9 C) (Oral)  Resp 16   Ht 6\' 3"  (1.905 m)   Wt 234 lb 9.6 oz (106.4 kg)   SpO2 97%   BMI 29.32 kg/m   BP Readings from Last 3 Encounters:  01/22/24 (!) 166/74  11/25/23 (!) 187/61  11/22/23 (!) 157/64    Wt Readings from Last 3 Encounters:  01/22/24 234 lb 9.6 oz (106.4 kg)  01/22/24  233 lb (105.7 kg)  11/22/23 241 lb 12.8 oz (109.7 kg)    Physical Exam Vitals reviewed.  Constitutional:      General: He is not in acute distress.    Appearance: He is ill-appearing. He is not toxic-appearing or diaphoretic.  HENT:     Nose: Nose normal.     Mouth/Throat:     Mouth: Mucous membranes are moist.  Eyes:     General: No scleral icterus.    Conjunctiva/sclera: Conjunctivae normal.  Cardiovascular:     Rate and Rhythm: Regular rhythm. Bradycardia present. Frequent Extrasystoles are present.    Pulses:          Dorsalis pedis pulses are 1+ on the right side and 1+ on the left side.       Posterior tibial pulses are 1+ on the right side and 1+ on the left side.     Heart sounds: No murmur heard.    No friction rub. No gallop.     Comments: EKG--- SB with marked SA with frequent PVC's LAE LVH No Q waves Unchanged  Pulmonary:     Effort: Pulmonary effort is normal.     Breath sounds: No stridor. No wheezing, rhonchi or rales.  Abdominal:     General: Abdomen is flat.     Palpations: There is no mass.     Tenderness: There is no abdominal tenderness. There is no guarding or rebound.     Hernia: No hernia is present. There is no hernia in the left inguinal area or right inguinal area.  Genitourinary:    Pubic Area: No rash.      Penis: Normal and circumcised.      Testes: Normal.     Epididymis:     Right: Normal.     Left: Normal.     Prostate: Not enlarged, not tender and no nodules present.     Rectum: Normal. Guaiac result negative. No mass, tenderness, anal fissure, external hemorrhoid or internal hemorrhoid. Normal anal tone.  Musculoskeletal:        General: Normal range of motion.     Cervical back: Neck supple.     Right lower leg: No edema.     Left lower leg: No edema.  Feet:     Right foot:     Skin integrity: Dry skin present.     Toenail Condition: Right toenails are abnormally thick. Fungal disease present.    Left foot:     Skin  integrity: Dry skin present.     Toenail Condition: Left toenails are abnormally thick. Fungal disease present. Lymphadenopathy:     Lower Body: No right inguinal adenopathy. No left inguinal adenopathy.  Skin:    General: Skin is warm and dry.     Findings: Rash present.  Neurological:     Mental Status: He is alert. Mental status is at baseline.  Psychiatric:        Mood and Affect: Mood normal.        Behavior: Behavior normal.     Lab Results  Component Value Date   WBC 5.4 01/22/2024  HGB 12.8 (L) 01/22/2024   HCT 38.9 (L) 01/22/2024   PLT 166.0 01/22/2024   GLUCOSE 90 11/06/2023   CHOL 162 01/22/2024   TRIG 57.0 01/22/2024   HDL 45.50 01/22/2024   LDLCALC 105 (H) 01/22/2024   ALT 13 11/06/2023   AST 17 11/06/2023   NA 139 11/06/2023   K 3.6 11/06/2023   CL 106 11/06/2023   CREATININE 0.79 11/06/2023   BUN 12 11/06/2023   CO2 28 11/06/2023   TSH 4.59 01/22/2024   PSA 4.89 (H) 01/22/2024   HGBA1C 4.4 (L) 01/22/2024    DG Bone Survey Met Result Date: 11/14/2023 CLINICAL DATA:  Staging a myeloma. MGUS. Patient reports right rib fractures in October. EXAM: METASTATIC BONE SURVEY COMPARISON:  Chest abdomen pelvis CT 06/17/2023, rib radiographs 08/26/2023 FINDINGS: No evidence of lytic or destructive bone lesions. Remote distal fibular fracture that has healed. R again seen multiple right rib fractures. Degenerative change throughout the cervical, thoracic, and lumbar spine without compression deformity. Left greater than right hip osteoarthritis. Right knee chondrocalcinosis. Emote IMPRESSION: No evidence of lytic or destructive bone lesion. Electronically Signed   By: Narda Rutherford M.D.   On: 11/14/2023 23:39    Assessment & Plan:   Bradycardia, sinus- He is asx with this. -     TSH; Future -     EKG 12-Lead -     Ambulatory referral to Cardiology  Deficiency anemia- Will evaluate for vitamin deficiencies. -     IBC + Ferritin; Future -     CBC with  Differential/Platelet; Future -     Reticulocytes; Future -     Vitamin B12; Future -     Folate; Future -     Vitamin B1; Future -     Zinc; Future  Vitamin D deficiency -     VITAMIN D 25 Hydroxy (Vit-D Deficiency, Fractures); Future -     Cholecalciferol; Take 1 tablet (2,000 Units total) by mouth daily.  Dispense: 90 tablet; Refill: 1  Elevated PSA, between 10 and less than 20 ng/ml -     PSA; Future  Accelerated hypertension- Will start dyazide. -     TSH; Future -     Urinalysis, Routine w reflex microscopic; Future -     Aldosterone + renin activity w/ ratio; Future -     Triamterene-HCTZ; Take 1 each (1 capsule total) by mouth daily.  Dispense: 90 capsule; Refill: 1 -     AMB Referral [facility] Care Management  Dyslipidemia, goal LDL below 70- Will start a statin for CV risk reduction. -     Lipid panel; Future -     Rosuvastatin Calcium; Take 1 tablet (10 mg total) by mouth daily.  Dispense: 90 tablet; Refill: 1 -     AMB Referral [facility] Care Management  Encounter for general adult medical examination with abnormal findings- Exam completed, labs reviewed, vaccines reviewed (he refused), cancer screenings addressed, pt ed material was given.   Chronic hyperglycemia -     Hemoglobin A1c; Future  Need for prophylactic vaccination with combined diphtheria-tetanus-pertussis (DTP) vaccine -     Boostrix; Inject 0.5 mLs into the muscle once for 1 dose.  Dispense: 0.5 mL; Refill: 0  Frequent unifocal PVCs -     Ambulatory referral to Cardiology  Prostate cancer [facility]); T1c; GG 1; initial diagnosis 2011- PSA is down to 4.9.     Follow-up: Return in about 3 months (around 04/23/2024).  Sanda Linger, MD

## 2024-01-22 NOTE — Patient Instructions (Signed)
 Health Maintenance, Male  Adopting a healthy lifestyle and getting preventive care are important in promoting health and wellness. Ask your health care provider about:  The right schedule for you to have regular tests and exams.  Things you can do on your own to prevent diseases and keep yourself healthy.  What should I know about diet, weight, and exercise?  Eat a healthy diet    Eat a diet that includes plenty of vegetables, fruits, low-fat dairy products, and lean protein.  Do not eat a lot of foods that are high in solid fats, added sugars, or sodium.  Maintain a healthy weight  Body mass index (BMI) is a measurement that can be used to identify possible weight problems. It estimates body fat based on height and weight. Your health care provider can help determine your BMI and help you achieve or maintain a healthy weight.  Get regular exercise  Get regular exercise. This is one of the most important things you can do for your health. Most adults should:  Exercise for at least 150 minutes each week. The exercise should increase your heart rate and make you sweat (moderate-intensity exercise).  Do strengthening exercises at least twice a week. This is in addition to the moderate-intensity exercise.  Spend less time sitting. Even light physical activity can be beneficial.  Watch cholesterol and blood lipids  Have your blood tested for lipids and cholesterol at 66 years of age, then have this test every 5 years.  You may need to have your cholesterol levels checked more often if:  Your lipid or cholesterol levels are high.  You are older than 66 years of age.  You are at high risk for heart disease.  What should I know about cancer screening?  Many types of cancers can be detected early and may often be prevented. Depending on your health history and family history, you may need to have cancer screening at various ages. This may include screening for:  Colorectal cancer.  Prostate cancer.  Skin cancer.  Lung  cancer.  What should I know about heart disease, diabetes, and high blood pressure?  Blood pressure and heart disease  High blood pressure causes heart disease and increases the risk of stroke. This is more likely to develop in people who have high blood pressure readings or are overweight.  Talk with your health care provider about your target blood pressure readings.  Have your blood pressure checked:  Every 3-5 years if you are 9-95 years of age.  Every year if you are 85 years old or older.  If you are between the ages of 29 and 29 and are a current or former smoker, ask your health care provider if you should have a one-time screening for abdominal aortic aneurysm (AAA).  Diabetes  Have regular diabetes screenings. This checks your fasting blood sugar level. Have the screening done:  Once every three years after age 23 if you are at a normal weight and have a low risk for diabetes.  More often and at a younger age if you are overweight or have a high risk for diabetes.  What should I know about preventing infection?  Hepatitis B  If you have a higher risk for hepatitis B, you should be screened for this virus. Talk with your health care provider to find out if you are at risk for hepatitis B infection.  Hepatitis C  Blood testing is recommended for:  Everyone born from [birth year] through 1963.  Anyone  with known risk factors for hepatitis C.  Sexually transmitted infections (STIs)  You should be screened each year for STIs, including gonorrhea and chlamydia, if:  You are sexually active and are younger than 66 years of age.  You are older than 66 years of age and your health care provider tells you that you are at risk for this type of infection.  Your sexual activity has changed since you were last screened, and you are at increased risk for chlamydia or gonorrhea. Ask your health care provider if you are at risk.  Ask your health care provider about whether you are at high risk for HIV. Your health care provider  may recommend a prescription medicine to help prevent HIV infection. If you choose to take medicine to prevent HIV, you should first get tested for HIV. You should then be tested every 3 months for as long as you are taking the medicine.  Follow these instructions at home:  Alcohol use  Do not drink alcohol if your health care provider tells you not to drink.  If you drink alcohol:  Limit how much you have to 0-2 drinks a day.  Know how much alcohol is in your drink. In the [place] one drink equals one 12 oz bottle of beer (355 mL), one 5 oz glass of wine (148 mL), or one 1 oz glass of hard liquor (44 mL).  Lifestyle  Do not use any products that contain nicotine or tobacco. These products include cigarettes, chewing tobacco, and vaping devices, such as e-cigarettes. If you need help quitting, ask your health care provider.  Do not use street drugs.  Do not share needles.  Ask your health care provider for help if you need support or information about quitting drugs.  General instructions  Schedule regular health, dental, and eye exams.  Stay current with your vaccines.  Tell your health care provider if:  You often feel depressed.  You have ever been abused or do not feel safe at home.  Summary  Adopting a healthy lifestyle and getting preventive care are important in promoting health and wellness.  Follow your health care provider's instructions about healthy diet, exercising, and getting tested or screened for diseases.  Follow your health care provider's instructions on monitoring your cholesterol and blood pressure.  This information is not intended to replace advice given to you by your health care provider. Make sure you discuss any questions you have with your health care provider.  Document Revised: 03/06/2021 Document Reviewed: 03/06/2021  Elsevier Patient Education  2024 [employer].

## 2024-01-22 NOTE — Patient Instructions (Signed)
 Mr. Huelsmann , Thank you for taking time to come for your Medicare Wellness Visit. I appreciate your ongoing commitment to your health goals. Please review the following plan we discussed and let me know if I can assist you in the future.   Referrals/Orders/Follow-Ups/Clinician Recommendations: It was nice talking with you today.  Keep up the good work.  This is a list of the screening recommended for you and due dates:  Health Maintenance  Topic Date Due   DTaP/Tdap/Td vaccine (1 - Tdap) Never done   Zoster (Shingles) Vaccine (1 of 2) Never done   Pneumonia Vaccine (1 of 1 - PCV) Never done   COVID-19 Vaccine (4 - 2024-25 season) 06/30/2023   Flu Shot  01/27/2024*   Medicare Annual Wellness Visit  01/21/2025   Colon Cancer Screening  08/13/2028   Hepatitis C Screening  Completed   HPV Vaccine  Aged Out  *Topic was postponed. The date shown is not the original due date.    Advanced directives: (Declined) Advance directive discussed with you today. Even though you declined this today, please call our office should you change your mind, and we can give you the proper paperwork for you to fill out.  Next Medicare Annual Wellness Visit scheduled for next year: Yes

## 2024-01-23 ENCOUNTER — Telehealth: Payer: Self-pay | Admitting: Internal Medicine

## 2024-01-23 LAB — LIPID PANEL
Cholesterol: 162 mg/dL (ref 0–200)
HDL: 45.5 mg/dL (ref >39.00)
LDL Cholesterol: 105 mg/dL — ABNORMAL HIGH (ref 0–99)
NonHDL: 116.78
Total CHOL/HDL Ratio: 4
Triglycerides: 57 mg/dL (ref 0.0–149.0)
VLDL: 11.4 mg/dL (ref 0.0–40.0)

## 2024-01-23 LAB — URINALYSIS, ROUTINE W REFLEX MICROSCOPIC
Bilirubin Urine: NEGATIVE
Hgb urine dipstick: NEGATIVE
Ketones, ur: NEGATIVE
Leukocytes,Ua: NEGATIVE
Nitrite: NEGATIVE
Specific Gravity, Urine: 1.025 (ref 1.000–1.030)
Total Protein, Urine: 30 — AB
Urine Glucose: NEGATIVE
Urobilinogen, UA: 0.2 (ref 0.0–1.0)
pH: 6 (ref 5.0–8.0)

## 2024-01-23 LAB — CBC WITH DIFFERENTIAL/PLATELET
Basophils Absolute: 0 10*3/uL (ref 0.0–0.1)
Basophils Relative: 0.6 % (ref 0.0–3.0)
Eosinophils Absolute: 0.3 10*3/uL (ref 0.0–0.7)
Eosinophils Relative: 5.4 % — ABNORMAL HIGH (ref 0.0–5.0)
HCT: 38.9 % — ABNORMAL LOW (ref 39.0–52.0)
Hemoglobin: 12.8 g/dL — ABNORMAL LOW (ref 13.0–17.0)
Lymphocytes Relative: 38.4 % (ref 12.0–46.0)
Lymphs Abs: 2.1 10*3/uL (ref 0.7–4.0)
MCHC: 32.8 g/dL (ref 30.0–36.0)
MCV: 74 fl — ABNORMAL LOW (ref 78.0–100.0)
Monocytes Absolute: 0.5 10*3/uL (ref 0.1–1.0)
Monocytes Relative: 9.6 % (ref 3.0–12.0)
Neutro Abs: 2.5 10*3/uL (ref 1.4–7.7)
Neutrophils Relative %: 46 % (ref 43.0–77.0)
Platelets: 166 10*3/uL (ref 150.0–400.0)
RBC: 5.27 Mil/uL (ref 4.22–5.81)
RDW: 14.7 % (ref 11.5–15.5)
WBC: 5.4 10*3/uL (ref 4.0–10.5)

## 2024-01-23 NOTE — Telephone Encounter (Signed)
 Spoke with patient and he advised me that he was here an hour and a half ago to get his labs and urine. He also brought in paperwork about his IV treatment that he uses. I have placed the papers on Dr. Yetta Barre desk for review.

## 2024-01-23 NOTE — Telephone Encounter (Signed)
 Copied from CRM [id number]. Topic: Clinical - Lab/Test Results >> Jan 23, 2024 12:53 PM [name] wrote: Reason for CRM: Patient says someone called him this morning and he was half sleep. Patient wanted to clear up some information about his blood test  along with some transportation concerns. He is asking if whomever he spoke to can contact him back. Thank you >> Jan 23, 2024  2:33 PM [name] wrote: Patient called in wanting to know the status of the message, would like for someone to call back so that he doesn't have to take a wasted trip yet.

## 2024-01-24 LAB — TSH: TSH: 4.59 u[IU]/mL (ref 0.35–5.50)

## 2024-01-24 LAB — VITAMIN D 25 HYDROXY (VIT D DEFICIENCY, FRACTURES): VITD: 22.34 ng/mL — ABNORMAL LOW (ref 30.00–100.00)

## 2024-01-24 LAB — PSA: PSA: 4.89 ng/mL — ABNORMAL HIGH (ref 0.10–4.00)

## 2024-01-24 LAB — FOLATE: Folate: 16.2 ng/mL (ref >5.9)

## 2024-01-24 LAB — HEMOGLOBIN A1C: Hgb A1c MFr Bld: 4.4 % — ABNORMAL LOW (ref 4.6–6.5)

## 2024-01-24 LAB — IBC + FERRITIN
Ferritin: 141.1 ng/mL (ref 22.0–322.0)
Iron: 144 ug/dL (ref 42–165)
Saturation Ratios: 42.5 % (ref 20.0–50.0)
TIBC: 338.8 ug/dL (ref 250.0–450.0)
Transferrin: 242 mg/dL (ref 212.0–360.0)

## 2024-01-24 LAB — VITAMIN B12: Vitamin B-12: 1437 pg/mL — ABNORMAL HIGH (ref 211–911)

## 2024-01-24 MED ORDER — CHOLECALCIFEROL 50 MCG (2000 UT) PO TABS: 1.0000 | ORAL_TABLET | Freq: Every day | ORAL | 1 refills | Status: AC

## 2024-01-24 MED ORDER — TRIAMTERENE-HCTZ 37.5-25 MG PO CAPS: 1.0000 | ORAL_CAPSULE | Freq: Every day | ORAL | 1 refills | Status: DC

## 2024-01-24 MED ORDER — ROSUVASTATIN CALCIUM 10 MG PO TABS: 10.0000 mg | ORAL_TABLET | Freq: Every day | ORAL | 1 refills | Status: DC

## 2024-01-25 ENCOUNTER — Encounter (HOSPITAL_BASED_OUTPATIENT_CLINIC_OR_DEPARTMENT_OTHER): Payer: Self-pay | Admitting: Physical Therapy

## 2024-01-27 ENCOUNTER — Ambulatory Visit: Admitting: Physical Therapy

## 2024-01-27 ENCOUNTER — Encounter: Payer: Self-pay | Admitting: Physical Therapy

## 2024-01-27 DIAGNOSIS — R2689 Other abnormalities of gait and mobility: Secondary | ICD-10-CM | POA: Diagnosis not present

## 2024-01-27 DIAGNOSIS — M25652 Stiffness of left hip, not elsewhere classified: Secondary | ICD-10-CM | POA: Diagnosis not present

## 2024-01-27 DIAGNOSIS — M6281 Muscle weakness (generalized): Secondary | ICD-10-CM | POA: Diagnosis not present

## 2024-01-27 DIAGNOSIS — M25675 Stiffness of left foot, not elsewhere classified: Secondary | ICD-10-CM

## 2024-01-27 DIAGNOSIS — R2681 Unsteadiness on feet: Secondary | ICD-10-CM

## 2024-01-27 DIAGNOSIS — M25662 Stiffness of left knee, not elsewhere classified: Secondary | ICD-10-CM | POA: Diagnosis not present

## 2024-01-27 DIAGNOSIS — R252 Cramp and spasm: Secondary | ICD-10-CM | POA: Diagnosis not present

## 2024-01-27 DIAGNOSIS — M25672 Stiffness of left ankle, not elsewhere classified: Secondary | ICD-10-CM | POA: Diagnosis not present

## 2024-01-27 DIAGNOSIS — Z9181 History of falling: Secondary | ICD-10-CM

## 2024-01-27 DIAGNOSIS — R600 Localized edema: Secondary | ICD-10-CM

## 2024-01-27 NOTE — Therapy (Signed)
 OUTPATIENT PHYSICAL THERAPY NEURO TREATMENT    Patient Name: Michael [name] MRN: [id number] DOB:[birth year], 68 y.o., male Today's Date: 01/27/2024   PCP: [name], MD REFERRING PROVIDER: Persons, [place], [place]   END OF SESSION:  PT End of Session - 01/27/24 1455     Visit Number 15    Number of Visits 29   17 + 12   Date for PT Re-Evaluation 03/20/24   pushed out due to aquatic needs   Authorization Type [employer] MEDICARE    Progress Note Due on Visit 10    PT Start Time 1448    PT Stop Time 1538    PT Time Calculation (min) 50 min    Equipment Utilized During Treatment Gait belt    Activity Tolerance Patient tolerated treatment well    Behavior During Therapy WFL for tasks assessed/performed            Past Medical History:  Diagnosis Date   Hyperlipidemia    Hypertension    Lumbar disc disease    Prostate cancer (HCC)    Past Surgical History:  Procedure Laterality Date   LUMBAR DISC SURGERY  2014   LUMBAR DISC SURGERY     right ankle fracture  1978   external pinning   Patient Active Problem List   Diagnosis Date Noted   Deficiency anemia 01/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   Bradycardia, sinus 01/22/2024   Screening for colon cancer 01/22/2024   Need for prophylactic vaccination with combined diphtheria-tetanus-pertussis (DTP) vaccine 01/22/2024   Frequent unifocal PVCs 01/22/2024   MGUS (monoclonal gammopathy of unknown significance) 06/10/2023   RBC microcytosis 06/10/2023   DDD (degenerative disc disease), lumbar 12/21/2022   Prostate cancer (HCC); T1c; GG 1; initial diagnosis 2011 12/05/2022   Chronic hyperglycemia 11/16/2022   Dyslipidemia, goal LDL below 70 09/23/2022   Elevated PSA, between 10 and less than 20 ng/ml 09/17/2022   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   Vitamin D deficiency 09/07/2021    ONSET DATE: symptoms began in 2020; pt had a fall August 23, 2023  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  He states this PT was not listed in his chart and he did not know how to fix it (PT informed him he can always call clinic and leave a message for therapist as sometimes multiple providers within this discipline will not show up for patient).  He requests to warmup on the bike today as he has been prioritizing walking.  Pt accompanied by: self (he continues to drive himself)  PERTINENT HISTORY: degenerative disc disease (followed by [facility]) s/p L5-S1 surgery, HLD, HTN, prostate cancer  Patient's symptoms started in 2020 - right flank pain (reported initially as abdominal pain, but pt clarifies this is not a deep cramp/stomach pain, but a muscular soreness especially with movement), core weakness (not able to sit up well), sneezing and coughing was painful as well.  A year later he noted stiffness in his legs after sitting where he has to walk up his legs with his hands.  Now, he reports numbness and soreness in left hamstring area as well as swelling and muscle spasms (left worse).  In order to walk he has to weight shift and bring feet into narrowed BOS then start with small steps.  He loosens up with movement. He reports freezing when startled. Ambulates w/ SPC.  Upper body remains WNL.  Is being  followed by hematology for possible thalassemia due to initial labs being positive for IgG kappa monoclonal antibody on 05/03/2023.  Pt has a fall August 23, 2023 halting his prior PT episode due to broken ribs.  PAIN:  Are you having pain? No  PRECAUTIONS: Fall  RED FLAGS: None-pt does report more urinary urgency that he reports corresponds with the reported itch in his legs and low back, but he has discussed this with his physician (Dr. [name])  WEIGHT BEARING RESTRICTIONS: No  FALLS: Has patient fallen in last 6 months? Yes. 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  He modifies some homemaking tasks, but reports he can stand longer to do things like cooking.  PATIENT GOALS: Work on upper body strength  OBJECTIVE:   DIAGNOSTIC FINDINGS:  EMG on 05/14/23 showed an active left S1 radiculopathy (similar to prior EMG)  X-ray Bone Survey Met (11/06/2023):   IMPRESSION: No evidence of lytic or destructive bone lesion  X-ray Ribs Unilateral Right (09/04/2023): Radiographs of his right ribs demonstrate no more blunting of the  costophrenic angle he does have vascular markings going out to the  periphery.   COGNITION: Overall cognitive status: Within functional limits for tasks assessed and pt hyperverbal   SENSATION: Light touch: WFL  COORDINATION: LE RAMS:  slow and deliberate Bilateral Heel-to-shin:  limited by lack of bilateral ER  EDEMA:  bilateral ankle edema (baseline per pt - he has been using castor  oil and lavender to improve skin integrity - MD aware of regimen)  MUSCLE TONE: None noted in BLE  POSTURE: weight shift right  LOWER  EXTREMITY ROM:     Active  Right Eval Left Eval  Hip flexion Grossly WFL - limited bilateral ER (basically at neutral  Hip extension   Hip abduction   Hip adduction   Hip internal rotation   Hip external rotation   Knee flexion   Knee extension   Ankle dorsiflexion   Ankle plantarflexion    Ankle inversion    Ankle eversion     (Blank rows = not tested)  LOWER EXTREMITY MMT:    MMT Right Eval Left Eval  Hip flexion 3/5 3+/5  Hip extension    Hip abduction    Hip adduction    Hip internal rotation    Hip external rotation    Knee flexion    Knee extension 4+/5 4+/5  Ankle dorsiflexion 3+/5 4/5  Ankle plantarflexion    Ankle inversion    Ankle eversion    (Blank rows = not tested)  BED MOBILITY:  Pt reports his bed mobility has greatly improved and he has switched his setup so he enters and exits bed on side closest to his bathroom  TRANSFERS: Assistive device utilized: None  Sit to stand: Modified independence - uses hands Stand to sit: Modified independence Chair to chair: Complete Independence Floor:  pt performed modI on most recent fall  GAIT: Gait pattern: step through pattern, decreased step length- Right, decreased step length- Left, decreased stride length, decreased hip/knee flexion- Right, decreased hip/knee flexion- Left, lateral lean- Right, decreased trunk rotation, and narrow BOS Distance walked: various clinic distances Assistive device utilized: None Level of assistance: SBA Comments: No notable ataxia or involuntary movements during ambulation into/out of clinic.  FUNCTIONAL TESTS:  5 times sit to stand: 24.72 seconds w/ light BUE support - wide stance with some crouching into tall stand due to imbalance Timed up and go (TUG): To be assessed. 10 meter walk test: To be assessed.  PATIENT SURVEYS:  ABC  scale To be assessed.  TODAY'S TREATMENT:                                                                                                                              -SciFit using BUE/BLE support on single progressive peak mode to level 7.0 over 8 minutes for large amplitude reciprocal mobility and global strengthening and tolerance.  RPE 8/10 following task. -Standing incline board gastroc stretch attempting to get R heel to ground, unable following prolonged stretch -Decline board unsupported ball catch > naming noun for ABCs (pt has x5 errors and several instances of paused activity) > decline squats w/ intermittent touch support for balance x12 -Pt ambulates from SciFit to outdoor unlevel sidewalk to grassy incline then back to clinic (~800 ft) mod I using bilateral trekking poles, discussed potential benefit for his longer walks especially like that of his recent walk to [employer] where he was fearful of potholes and other obstacles, he demonstrates safe 2 pt gait over various surfaces, discussed staggered pattern  for incline/decline to widen BOS as needed for stability, per reported interest in OOP purchase PT prints resources for pt - discussed benefit of adjustable one with rubber tip and possibly foldable as pt doesn't need AD at all times and this would make transporting it easier.  PATIENT EDUCATION: Education details: Continue HEP w/ additions and chair yoga.  Continue walking program.  Discussed moving last aquatic appt to land with this therapist due to anticipated maximized potential in aquatic setting in 2-3 visits - pt agreeable to change.  Trekking pole resource. Person educated: Patient Education method: Explanation Education comprehension: verbalized understanding and needs further education  HOME EXERCISE PROGRAM: Review from episode prior - modify as needed:  Access Code: [email or web address]  You Can Walk For A Certain Length Of Time Each Day (can start with 3-4 days and work up to  everyday - use cane for safety and walk in well lit familiar areas with smooth surfaces)                          Walk 5 minutes 2 times per day.             Increase 2  minutes every 7 days              Work up to 20 minutes (1x per day).               Example:                         Day 1-2           4-5 minutes     3 times per day                         Day 7-8           10-12 minutes 2-3 times per day                         Day 13-14       20-22 minutes 1-2 times per day  Chair yoga poses 1-6, 8-9 (modified position 9 and held x10 seconds), 12  GOALS: Goals reviewed with patient? Yes  SHORT TERM GOALS: Target date: 12/27/2023  Pt will decrease 5xSTS to </=19.72 seconds with improved immediate standing balance in order to demonstrate decreased risk for falls and improved functional bilateral LE strength and power. Baseline:  24.72 seconds w/ light BUE support; 15.97 sec w/ BUE support (3/3) Goal status: MET  2.  Pt will demonstrate TUG of </=17.09 seconds in order to decrease risk of falls and improve functional mobility using LRAD. Baseline: 22.09 sec no AD SBA (2/3); 12.86 sec no AD SBA (3/3) Goal status: MET  3.  Pt will demonstrate a gait speed of >/=2.52 feet/sec in order to decrease risk for falls. Baseline: 2.32 ft/sec (2/3); 2.74 ft/sec (3/3) Goal status: MET  LONG TERM GOALS: Target date: 01/24/2024  Pt will be independent and compliant with strength, stretching, and balance focused land-based HEP and aquatic HEP if continuing following discharge in order to maintain functional progress and improve mobility. Baseline:  IND/compliant per report (3/25) Goal status: MET  2.  Pt will decrease 5xSTS to </=14.72 seconds in order to demonstrate decreased risk for falls and improved functional bilateral LE strength and power. Baseline: 24.72 seconds w/ light BUE support; 16.47 sec w/  intermittent touch to initiate stand (3/25) Goal status: PARTIALLY MET  3.  Pt will  demonstrate TUG of </=12.09 seconds in order to decrease risk of falls and improve functional mobility using LRAD. Baseline: 22.09 sec no AD SBA (2/3); 12.22 sec IND (3/25) Goal status: PARTIALLY MET  4.  Pt will demonstrate a gait speed of >/=2.92 feet/sec in order to decrease risk for falls. Baseline: 2.32 ft/sec (2/3); 2.74 ft/sec (3/3); 2.85 ft/sec (3/25) Goal status: PARTIALLY MET  5.  Pt will ambulate >/=800 feet at IND level over unlevel outdoor surfaces and grass to promote household and community access. Baseline: 1000 ft over unlevel and grass IND (3/25) Goal status: MET  6.  Pt will improve ABC Scale to >/= 69.4% in order to demonstrate improved fall risk and engagement in daily activities. Baseline: 59.4% (2/3); 98.94% (3/25) Goal status: MET  GOALS (re-cert 1/61) : Goals reviewed with patient? Yes  SHORT TERM GOALS: Target date: 02/14/2024  Pt will be independent and compliant with aquatic HEP in order to maintain functional progress and improve mobility. Baseline:  To be established. Goal status: INITIAL  2.  Pt will demonstrate a gait speed of >/=2.92 feet/sec in order to decrease risk for falls. Baseline: 2.32 ft/sec (2/3); 2.74 ft/sec (3/3); 2.85 ft/sec (3/25) Goal status: INITIAL  LONG TERM GOALS: Target date: 03/06/2024  1.  Pt will decrease 5xSTS to </=14.72 seconds in order to demonstrate decreased risk for falls and improved functional bilateral LE strength and power. Baseline: 24.72 seconds w/ light BUE support; 16.47 sec w/ intermittent touch to initiate stand (3/25) Goal status: INITIAL  2.  Pt will demonstrate TUG of </=10 seconds in order to decrease risk of falls and improve functional mobility using LRAD. Baseline: 22.09 sec no AD SBA (2/3); 12.22 sec IND (3/25) Goal status: REVISED  3.  Pt will demonstrate a gait speed of >/=3.12 feet/sec in order to decrease risk for falls. Baseline: 2.32 ft/sec (2/3); 2.74 ft/sec (3/3); 2.85 ft/sec (3/25) Goal  status:  REVISED  ASSESSMENT:  CLINICAL IMPRESSION: Focus of skilled session today on continuing to improve fluidity of gait and pt's ability to safely traverse the community.  He does well with trekking poles on incline and decline today and was provided options for purchase should he choose.  Pt continues to be limited by some ROM limitations especially in right ankle and ongoing stiffness.  He continues to benefit from skilled PT in this setting to improve general upright mobility.  OBJECTIVE IMPAIRMENTS: decreased activity tolerance, decreased balance, decreased coordination, decreased endurance, decreased knowledge of condition, decreased mobility, difficulty walking, decreased strength, increased edema, and increased muscle spasms.   ACTIVITY LIMITATIONS: carrying, lifting, bending, sitting, standing, squatting, stairs, transfers, bed mobility, and locomotion level  PARTICIPATION LIMITATIONS: shopping, community activity, and yard work  PERSONAL FACTORS: Age, Fitness, and 1-2 comorbidities: HTN, ongoing S1 radiculopathy  are also affecting patient's functional outcome.   REHAB POTENTIAL: Excellent  CLINICAL DECISION MAKING: Evolving/moderate complexity  EVALUATION COMPLEXITY: Moderate  PLAN:  PT FREQUENCY: 2x/week (1 land and 1 aquatic for initial 3 weeks of re-cert)  PT DURATION: 8 weeks + 6 weeks (0/96 re-cert)  PLANNED INTERVENTIONS: 04540- PT Re-evaluation, 97110-Therapeutic exercises, 97530- Therapeutic activity, 97112- Neuromuscular re-education, 97535- Self Care, 98119- Manual therapy, [phone]- Gait training, [phone]- Orthotic Fit/training, [phone]- Aquatic Therapy, Patient/Family education, Balance training, Stair training, Taping, Dry Needling, Joint mobilization, Spinal mobilization, Vestibular training, DME instructions, Moist heat, Therapeutic exercises, Therapeutic activity, Neuromuscular re-education, Gait training, and Self Care  PLAN FOR NEXT SESSION: Modify land HEP for  core and LE strength, stretching, and balance.  SciFit for reciprocal mobility.  Reaction time - blaze pods, balloon taps on compliant surfaces, timed puzzles on compliant surface.   Upper body strength. Hip flexor strength.  Treadmill training.  Standing windmills vs modified w/ unilateral UE support.  R adductor strengthening - card slides across midline  Aquatics:  please establish simple aquatic HEP per pt request  [name], PT, DPT

## 2024-01-28 ENCOUNTER — Other Ambulatory Visit: Payer: Self-pay | Admitting: Internal Medicine

## 2024-01-28 ENCOUNTER — Telehealth: Payer: Self-pay | Admitting: *Deleted

## 2024-01-28 ENCOUNTER — Encounter: Payer: Self-pay | Admitting: Internal Medicine

## 2024-01-28 DIAGNOSIS — D538 Other specified nutritional anemias: Secondary | ICD-10-CM

## 2024-01-28 LAB — VITAMIN B1: Vitamin B1 (Thiamine): 15 nmol/L (ref 8–30)

## 2024-01-28 LAB — ALDOSTERONE + RENIN ACTIVITY W/ RATIO
ALDO / PRA Ratio: 40 ratio — ABNORMAL HIGH (ref 0.9–28.9)
Aldosterone: 4 ng/dL
Renin Activity: 0.1 ng/mL/h — ABNORMAL LOW (ref 0.25–5.82)

## 2024-01-28 LAB — ZINC: Zinc: 45 ug/dL — ABNORMAL LOW (ref 60–130)

## 2024-01-28 LAB — RETICULOCYTES
ABS Retic: 80250 {cells}/uL (ref 25000–90000)
Retic Ct Pct: 1.5 %

## 2024-01-28 MED ORDER — ZINC GLUCONATE 50 MG PO TABS: 50.0000 mg | ORAL_TABLET | Freq: Every day | ORAL | 1 refills | Status: AC

## 2024-01-28 NOTE — Progress Notes (Unsigned)
 Care Guide Pharmacy Note  01/28/2024 Name: Michael [name] MRN: [id number] DOB: 1955/11/25  Referred By: [name], MD Reason for referral: Care Coordination (Outreach to schedule referral with pharmacist )   Michael [name] is a 68 y.o. year old male who is a primary care patient of [name], MD.  [name] was referred to the pharmacist for assistance related to: HTN  An unsuccessful telephone outreach was attempted today to contact the patient who was referred to the pharmacy team for assistance with medication management. Additional attempts will be made to contact the patient.  Burman Nieves, CMA [place]  [facility], [facility] Guide Direct Dial: [phone]  Fax: [phone] Website: [email or web address]

## 2024-01-30 NOTE — Progress Notes (Signed)
 Care Guide Pharmacy Note  01/30/2024 Name: Loel Betancur MRN: [id number] DOB: 07-Nov-1955  Referred By: [name], MD Reason for referral: Care Coordination (Outreach to schedule referral with pharmacist )   Que Meneely is a 68 y.o. year old male who is a primary care patient of [name], MD.  [name] was referred to the pharmacist for assistance related to: HTN  Successful contact was made with the patient to discuss pharmacy services including being ready for the pharmacist to call at least 5 minutes before the scheduled appointment time and to have medication bottles and any blood pressure readings ready for review. The patient agreed to meet with the pharmacist via telephone visit on 02/18/2024  Burman Nieves, CMA [place]  [facility], [facility] Guide Direct Dial: [phone]  Fax: [phone] Website: [email or web address]

## 2024-02-03 ENCOUNTER — Ambulatory Visit: Admitting: Physical Therapy

## 2024-02-05 NOTE — Progress Notes (Signed)
 I saw Michael [name] in neurology clinic on 02/13/24 in follow up for stiff person's syndrome.  HPI: Michael [name] is a 68 y.o. year old male with a history of degenerative disc disease s/p L5-S1 surgery, HLD, HTN, prostate cancer who we last saw on 11/21/23.  To briefly review: Initial consult 05/03/23: Patient's symptoms started in 2020. He was having abdominal pain and a colonscopy for this. He mentioned that he noticed symptoms in core with weakness (not able to sit up well). Sneezing and coughing was painful as well. He then noticed problems in his legs a year later. While sitting, his legs freeze. He cannot stand well. He has to "feel his legs under him" and start with small steps. Once he gets going, he feels he can move better. He mentions that is someone sneaks up on her or scares him, he will freeze up again. He walks with a cane. He has fallen 3 times since the beginning of 2024. He last feel about 1 week ago while trying to do lawn work. He was holding on the the mower for stability. He saw an insect that scared him, his left leg locked, then he fell.    He endorses some numbness and soreness in left hamstring area. He endorses muscle spasms (mostly in the left leg) usually when sitting. He denies twitching. He denies changes in bowel or bladder, including incontinence. He denies any symptoms in his upper extremities.   Patient had an EMG on 04/20/22 showed residuals of L5-S1 radiculopathy per report.   Patient went to ED for symptoms on 02/20/23. Electrolytes and CK were unremarkable.   Patient is on baclofen 5 mg at bedtime (has been weaning himself off of this) and gabapentin 300 mg TID (only taking BID), but has not taken anything in 4 days in preparation for this appointment. These medications do help (gabapentin more).   Any change in urine color, especially after exertion/physical activity? No   The patient denies symptoms suggestive of oculobulbar weakness including  diplopia, ptosis, dysphagia, poor saliva control, dysarthria/dysphonia, impaired mastication, facial weakness/droop. He does mention that when he swallows, he feels like he is swallowing a bubble and will cause gas.   There are no neuromuscular respiratory weakness symptoms, particularly orthopnea>dyspnea.    Pseudobulbar affect is absent.   The patient has not noticed any recent skin rashes nor does he report any constitutional symptoms like fever, night sweats, anorexia or unintentional weight loss.   EtOH use: None  Restrictive diet? Plant based diet Family history of neuropathy/myopathy/NM disease? No   Of note, there is note in patient's chart about prostate cancer. This is just being monitored currently per patient. Per patient, he does think he has cancer.   Mother has moved in with patient to help him out.   05/29/23: Overall, patient is similar to prior. He had another fall in the last week.   Initial labs on 05/03/23 were significant for an IgG kappa monoclonal antibody. I referred patient to hematology for this. This consultation is scheduled 06/10/23.   EMG on 05/14/23 showed an active left S1 radiculopathy (similar to prior EMG), but I also noticed spasms or jerking whenever I would touch the patient. It was difficult for patient to relax any muscles, including with antagonist contraction (co-contraction suspected). There was no other significant abnormalities. Given this, I added on some labs including GAD65 due to a suspicion for stiff person syndrome. GAD65 antibodies were positive (> 250 with normal < 5).  Patient is taking gabapentin as needed. It makes him drowsy. Patient is not taking baclofen currently.   08/21/23: Patient states he is doing much better. He is doing PT. He has not fallen recently. He feels like his core strength is better. He also feels looser and able to move better. He is still taking Valium 5 mg 3-4 times daily.   Patient was seen by hematology and is  being monitored for MGUS.   CT chest/abd/pelvis on 06/17/23 showed no evidence of malignancy.  I decided to start IVIg monthly at this visit.  11/21/23: Patient received his loading dose of IVIg at the end of 08/2023 and had maintenance dosing 09/2023, and 11/16/23. He is happy with the home infusion company. He continues to take valium 5 mg, but maybe only taking once a day at night. It does make him sleepy, so he cut back on it. He will take another dose if he has to go out into public.   He continues to feel better and less stiff. He thinks his walking is better. When it is cold, he finds the muscles shiver more and then he'll lose control of his legs.    He has not had any falls since 07/2023.  Most recent Assessment and Plan (11/21/23): This is Michael [name], a 68 y.o. male with muscle spasms and tightness in trunk and lower extremities with exaggerated startle, imbalance, and falls. His EMG showed evidence of an active left S1 radiculopathy, but more importantly, agonist/antagonist co-contraction. His GAD65 antibodies are also very elevated. 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 His CT C/A/P showed no evidence of malignancy. IFE did show M protein for which he is following with hematology for MGUS. Skeletal survey showed no lytic lesions.   Plan: -Continue Valium 5 mg up to 4 times daily -Continue monthly IVIg; next is 12/21/23 -Continue PT - patient will get more when he is released by ortho (for rib fractures) -Fall precautions discussed -Follow up with hematology as planned for MGUS  Since their last visit: Patient is still feeling improved compared to prior. He continues to take Valium. He did have an issue where he could not get the medication for 1.5 weeks. He had increased tightness and spasms while off the medication. He is currently taking it BID usually (morning and night).  Patient had IVIg on 12/21/23 and 01/18/24.  His next IVIg is schedule for 02/14/24. He is very happy with the home infusion company.  Patient is still doing PT.  He has not had any recent falls.  He is seeing hematology again in 04/2024.   MEDICATIONS:  Outpatient Encounter Medications as of 02/13/2024  Medication Sig   Cholecalciferol 50 MCG (2000 UT) TABS Take 1 tablet (2,000 Units total) by mouth daily.   diazepam (VALIUM) 5 MG tablet Take 1 tablet (5 mg total) by mouth every 6 (six) hours as needed for anxiety.   GAMUNEX-C 5 GM/50ML SOLN    GARLIC PO Take by mouth daily.   rosuvastatin (CRESTOR) 10 MG tablet Take 1 tablet (10 mg total) by mouth daily.   triamterene-hydrochlorothiazide (DYAZIDE) 37.5-25 MG capsule Take 1 each (1 capsule total) by mouth daily.   zinc gluconate 50 MG tablet Take 1 tablet (50 mg total) by mouth daily.   No facility-administered encounter medications on file as of 02/13/2024.    PAST MEDICAL HISTORY: Past Medical History:  Diagnosis Date   Hyperlipidemia    Hypertension  Lumbar disc disease    Prostate cancer (HCC)     PAST SURGICAL HISTORY: Past Surgical History:  Procedure Laterality Date   LUMBAR DISC SURGERY  2014   LUMBAR DISC SURGERY     right ankle fracture  1978   external pinning    ALLERGIES: No Known Allergies  FAMILY HISTORY: Family History  Problem Relation Age of Onset   High blood pressure Mother     SOCIAL HISTORY: Social History   Tobacco Use   Smoking status: Never    Passive exposure: Never   Smokeless tobacco: Never  Vaping Use   Vaping status: Never Used  Substance Use Topics   Alcohol use: Not Currently    Comment: rare   Drug use: No   Social History   Social History Narrative   Right Handed    Lives in a two story home.   Are you right handed or left handed? Right    Are you currently employed ?    What is your current occupation? retired   Do you live at home alone? no   Who lives with you? Mother 2025   What type of home do you  live in: 1 story or 2 story? two        Objective:  Vital Signs:  BP (!) 154/77   Pulse (!) 43   Ht 6\' 3"  (1.905 m)   Wt 233 lb (105.7 kg)   SpO2 98%   BMI 29.12 kg/m   General: General appearance: Awake and alert. No distress. Cooperative with exam.  Skin: No obvious rash or jaundice. HEENT: Atraumatic. Anicteric. Lungs: Non-labored breathing on room air  Heart: Regular Extremities: Bilateral lower extremity edema.  Neurological: Mental Status: Alert. Speech fluent. No pseudobulbar affect Cranial Nerves: CNII: No RAPD. Visual fields intact. CNIII, IV, VI: PERRL. No nystagmus. EOMI. CN V: Facial sensation intact bilaterally to fine touch. CN VII: Facial muscles symmetric and strong. No ptosis at rest. CN VIII: Hears finger rub well bilaterally. CN IX: No hypophonia. CN X: Palate elevates symmetrically. CN XI: Full strength shoulder shrug bilaterally. CN XII: Tongue protrusion full and midline. No atrophy or fasciculations. No significant dysarthria Motor: Tone is normal.  Individual muscle group testing (MRC grade out of 5):  Movement     Neck flexion 5    Neck extension 5     Right Left   Shoulder abduction 5 5   Elbow flexion 5 5   Elbow extension 5 5   Finger abduction - FDI 5 5   Finger abduction - ADM 5 5   Finger extension 5 5   Finger distal flexion - 2/3 5 5    Finger distal flexion - 4/5 5 5    Thumb flexion - FPL 5 5   Thumb abduction - APB 5 5    Hip flexion 4+ 4+   Hip extension 5 5   Hip adduction 5 5   Hip abduction 5 5   Knee extension 5 5   Knee flexion 5 5   Dorsiflexion 5 5   Plantarflexion 5 5    Reflexes:  Right Left  Bicep 2+ 2+  Tricep 2+ 2+  BrRad 2+ 2+  Knee 2+ 2+  Ankle 0 0   Sensation: Pinprick: Intact in all extremities Coordination: Intact finger-to- nose-finger bilaterally.  Gait: Mildly stiff gait. Continues to improve from prior   Lab and Test Review: New results: 01/22/24: Lipid panel: tChol 162, LDL 105, TG  57 TSH wnl  B12: 1437 Folate wnl B1 wnl HbA1c: 4.4 Zinc low at 45 Vit D low at 22.34  Previously reviewed results: 11/06/23: UPEP/UIFE: no M protein MM panel: IgG kappa monoclonal gammopathy CMP unremarkable CBC w/ diff significant for MCV of 71.1   08/05/23: CBC w/ diff: significant for MCV 66.8 (chronic) CMP unremarkable LDH wnl K/L ratio: 1.95 (mildly elevated) MM panel: IgG kappa monoclonal protein   05/14/23: Copper wnl VEGF wnl Kappa/lambda light chains: elevated to 1.79 GAD65: elevated to > 250 international units/mL IA-2 ab: elevated to  335.4 international units/mL Insulin abs elevated to 0.8 units/mL  02/20/23: CK 115 CBC significant for MCV of 65.9 (chronic) CMP unremarkable   11/22/22: PSA: 10.42 Lipid panel significant for LDL of 116 TSH 5.02 (wnl) [id number]: 5.9   04/05/22: ANA positive at 1:40 ACE wnl Ionized Ca wnl PTH wnl Vit D: 20.42 CRP < 1.0 ESR wnl (15) CCP negative RF negative B12: 1303 Ferritin 25.7    CT lumbar spine and myelogram (11/02/22): FINDINGS: LUMBAR MYELOGRAM FINDINGS:   Unchanged trace retrolisthesis at T12-L1 and L1-L2. No dynamic instability. Small ventral extradural defects from T12-L1 through L4-L5. No spinal canal stenosis or nerve root effacement.   CT LUMBAR MYELOGRAM FINDINGS:   Segmentation: Standard.   Alignment: Unchanged mild levocurvature. Unchanged trace retrolisthesis at T12-L1 and L1-L2.   Vertebrae: No acute fracture or other focal pathologic process. Small L3 bone island.   Conus medullaris and cauda equina: Conus extends to the T12-L1 level. Conus and cauda equina appear normal.   Paraspinal and other soft tissues: Aortoiliac atherosclerotic vascular disease.   Disc levels:   T12-L1: Unchanged small broad-based posterior disc osteophyte complex and mild left facet arthropathy. No stenosis.   L1-L2: Unchanged small shallow right paracentral disc osteophyte complex and mild bilateral facet  arthropathy. No stenosis.   L2-L3: Unchanged small broad-based posterior disc osteophyte complex and mild bilateral facet arthropathy. Unchanged mild right neuroforaminal stenosis. No spinal canal or left neuroforaminal stenosis.   L3-L4: Unchanged small broad-based posterior disc osteophyte complex and bilateral facet arthropathy. Unchanged mild bilateral neuroforaminal stenosis. No spinal canal stenosis.   L4-L5: Unchanged small broad-based posterior disc osteophyte complex and mild bilateral facet arthropathy. No stenosis.   L5-S1: Prior left hemilaminectomy. Unchanged tiny broad-based posterior disc protrusion and mild-to-moderate bilateral facet arthropathy. No stenosis.   IMPRESSION: 1. Unchanged mild multilevel lumbar spondylosis as described above. No high-grade stenosis or impingement. 2. Unchanged trace retrolisthesis at T12-L1 and L1-L2. No dynamic instability. 3.  Aortic Atherosclerosis (ICD10-I70.0).   MRI lumbar spine wo contrast (09/30/2010): IMPRESSION:  1. Probable left S1 nerve root encroachment at L5-S1, felt to be  due to a combination of a posterolateral disc protrusion and a  probable synovial cyst extending medially from the left facet  joint.  This seems to be the most likely explanation for the  patient's symptoms.  Correlation with level of radicular symptoms  is recommended.  2.  Mild disc bulging and facet disease at the additional levels  with mild inferior foraminal narrowing as detailed above.  There is  the potential for extraforaminal nerve root encroachment on the  left at L2-L3, not definite.  No other significant spinal stenosis  is demonstrated.    EMG (04/20/22 by Dr. Sharl Davies):     EMG (05/14/23): NCV & EMG Findings: Extensive electrodiagnostic evaluation of the left lower limb with additional nerve conduction studies of the left upper limb and needle examination of the left thoracic paraspinal muscles (T8 and T5 levels)  shows: Left  sural and superficial peroneal/fibular sensory responses are absent. Left ulnar sensory response shows reduced amplitude (4 V). Left median and radial sensory responses are within normal limits. Left peroneal/fibular (EDB) motor response shows reduced amplitude (1.31 mV). Left peroneal/fibular (TA), tibial (AH), median (APB), and ulnar (ADM) motor responses are within normal limits. Left H reflex is absent. Chronic motor axon loss changes are seen in the left medial head of gastrocnemius and left short head of biceps femoris muscles, with active denervation changes also seen in the medial head of gastrocnemius muscle. All muscles were difficult to relax, even with antagonist muscle contraction. Lumbosacral paraspinal muscles were deferred due to prior lumbosacral spine surgery.   Impression: This is an abnormal study. The findings are most consistent with the following: Evidence of an active/ongoing intraspinal canal lesion (ie: motor radiculopathy) at the left S1 root or segment, moderate in degree electrically. Evidence of a length dependent, large fiber sensorimotor neuropathy, axon loss in type, mild to moderate in degree electrically. No electrodiagnostic evidence of demyelinating neuropathy. No electrodiagnostic evidence of a left median mononeuropathy at or distal to the wrist (ie: carpal tunnel syndrome).   CT chest/abd/pelvis (06/17/23): FINDINGS: CT CHEST FINDINGS   Cardiovascular: Normal caliber thoracic aorta normal size heart. No significant pericardial effusion/thickening.   Mediastinum/Nodes: No suspicious thyroid nodule. No pathologically enlarged mediastinal, hilar or axillary lymph nodes. Patulous distal esophagus   Lungs/Pleura: No suspicious pulmonary nodules or masses. Bibasilar atelectasis/scarring. No pleural effusion. No pneumothorax   Musculoskeletal: No aggressive lytic or blastic lesion of bone. Subacute non/minimally displaced left rib fractures.   CT ABDOMEN  PELVIS FINDINGS   Hepatobiliary: Unremarkable noncontrast enhanced appearance of the hepatic parenchyma. Gallbladder is unremarkable. No biliary ductal dilation.   Pancreas: No pancreatic ductal dilation or evidence of acute inflammation.   Spleen: No splenomegaly.   Adrenals/Urinary Tract: Bilateral adrenal glands are within normal limits. No hydronephrosis. No renal, ureteral or bladder calculi. Retroaortic left renal vein. Urinary bladder is unremarkable for degree of distension   Stomach/Bowel: Radiopaque enteric contrast material traverses the rectum. Stomach is unremarkable for degree of distension. No pathologic dilation of small or large bowel. Normal appendix. Moderate volume of formed stool in the colon. Left-sided colonic diverticulosis without findings of acute diverticulitis.   Vascular/Lymphatic: Aortic atherosclerosis. Normal caliber abdominal aorta. Smooth IVC contours. Retroaortic left renal vein. No pathologically enlarged abdominal or pelvic lymph nodes   Reproductive: Enlarged prostate gland.   Other: No significant abdominopelvic free fluid.   Musculoskeletal: No aggressive lytic or blastic lesion of bone. Multilevel degenerative changes spine   IMPRESSION: 1. No acute abnormality in the chest, abdomen or pelvis. 2. No suspicious mass or adenopathy identified in the chest, abdomen or pelvis. 3. Moderate volume of formed stool in the colon. 4. Left-sided colonic diverticulosis without findings of acute diverticulitis. 5. Enlarged prostate gland. 6. Subacute non/minimally displaced left rib fractures. 7.  Aortic Atherosclerosis (ICD10-I70.0).  ASSESSMENT: This is Michael [name], a 68 y.o. male with muscle spasms and tightness in trunk and lower extremities with exaggerated startle, imbalance, and falls. His EMG showed evidence of an active left S1 radiculopathy, but more importantly, agonist/antagonist co-contraction. His GAD65 antibodies are also  very elevated. 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 His CT C/A/P showed no evidence of malignancy. IFE did show M protein for which he is following with hematology for MGUS.   Overall, patient is doing very well with IVIg, decreasing his  valium to twice daily from 4 times daily prior. His stiffness is much improved.  Plan: -Continue Zinc supplementation 50 mg daily -Continue Vit D supplementation -Continue Valium 5 mg up to 4 times daily -Continue monthly IVIg -Follow up with hematology for monoclonal gammopathy as planned  Return to clinic in 6 months   Rommie Coats, MD

## 2024-02-09 ENCOUNTER — Encounter: Payer: Self-pay | Admitting: Cardiovascular Disease

## 2024-02-09 NOTE — Progress Notes (Unsigned)
 Cardiology Office Note:    Date:  02/10/2024   ID:  Michael [name], DOB Aug 16, 1956, MRN [id number]  PCP:  Arcadio Knuckles, MD   [facility] Providers Cardiologist:  Valla Pacey    Referring MD: Arcadio Knuckles, MD   Chief Complaint  Patient presents with   Shortness of Breath   Palpitations   January 05 2022  Michael [name] is a 68 y.o. male with a hx of ventricular bigeminy   No CP , no dyspnea. Exercises regularly  Bikes, walks regularly  Has some spinal issues that keeps him from exercising as much as he would want .  Had a physical ,  HR was 40 ECG  showed ventricular bigeminy   Has had some weight gain ,  ( due to lack of exercise )  Doesn't work any long  Previously worked at the [employer]    Mar 27, 2022 Michael [name] is seen today for follow up of his ventricular bigeminy. Event monitor shows NSR, sinus brady, Sinus tacy, frequent PVS PVC burden is 19% Echo  was ordered - not done yet Has seen Dr. Ebbie Goldmann in the past for his PVCs  Thinks he may have had an echo in the past   .  February 10, 2024 Michael [name] is seen for follow up of his frequent PVCs  Echo from 04/25/23  Normal LV systolic function ,  unable to assess diastolic function  Mild - mod MR Trivial AI   Has been diagnosed with SPS ( stiff persons syndrome, a neurological issue ) , is associated with balance, motor function  No cp, no dyspnea      Past Medical History:  Diagnosis Date   Hyperlipidemia    Hypertension    Lumbar disc disease    Prostate cancer (HCC)     Past Surgical History:  Procedure Laterality Date   LUMBAR DISC SURGERY  2014   LUMBAR DISC SURGERY     right ankle fracture  1978   external pinning    Current Medications: Current Meds  Medication Sig   Cholecalciferol 50 MCG (2000 UT) TABS Take 1 tablet (2,000 Units total) by mouth daily.   diazepam (VALIUM) 5 MG tablet Take 1 tablet (5 mg total) by mouth every 6 (six) hours as needed for anxiety.   GAMUNEX-C 5 GM/50ML SOLN     GARLIC PO Take by mouth daily.   rosuvastatin (CRESTOR) 10 MG tablet Take 1 tablet (10 mg total) by mouth daily.   triamterene-hydrochlorothiazide (DYAZIDE) 37.5-25 MG capsule Take 1 each (1 capsule total) by mouth daily.   zinc gluconate 50 MG tablet Take 1 tablet (50 mg total) by mouth daily.     Allergies:   Patient has no known allergies.   Social History   Socioeconomic History   Marital status: Divorced    Spouse name: Not on file   Number of children: 2   Years of education: Not on file   Highest education level: Some college, no degree  Occupational History   Occupation: RETIRED  Tobacco Use   Smoking status: Never    Passive exposure: Never   Smokeless tobacco: Never  Vaping Use   Vaping status: Never Used  Substance and Sexual Activity   Alcohol use: Not Currently    Comment: rare   Drug use: No   Sexual activity: Yes  Other Topics Concern   Not on file  Social History Narrative   Right Handed    Lives in a two story  home.   Are you right handed or left handed? Right    Are you currently employed ?    What is your current occupation? retired   Do you live at home alone? no   Who lives with you? Mother 2025   What type of home do you live in: 1 story or 2 story? two       Social Drivers of Corporate investment banker Strain: Low Risk  (01/22/2024)   Overall Financial Resource Strain (CARDIA)    Difficulty of Paying Living Expenses: Not very hard  Food Insecurity: No Food Insecurity (01/22/2024)   Hunger Vital Sign    Worried About Running Out of Food in the Last Year: Never true    Ran Out of Food in the Last Year: Never true  Transportation Needs: No Transportation Needs (01/22/2024)   PRAPARE - Administrator, Civil Service (Medical): No    Lack of Transportation (Non-Medical): No  Recent Concern: Transportation Needs - Unmet Transportation Needs (01/21/2024)   PRAPARE - Transportation    Lack of Transportation (Medical): Yes    Lack of  Transportation (Non-Medical): Yes  Physical Activity: Insufficiently Active (01/22/2024)   Exercise Vital Sign    Days of Exercise per Week: 2 days    Minutes of Exercise per Session: 60 min  Stress: No Stress Concern Present (01/22/2024)   [employer] of Occupational Health - Occupational Stress Questionnaire    Feeling of Stress : Not at all  Social Connections: Socially Isolated (01/22/2024)   Social Connection and Isolation Panel [NHANES]    Frequency of Communication with Friends and Family: More than three times a week    Frequency of Social Gatherings with Friends and Family: Twice a week    Attends Religious Services: Never    Database administrator or Organizations: No    Attends Engineer, structural: Never    Marital Status: Divorced     Family History: The patient's family history includes High blood pressure in his mother.  ROS:   Please see the history of present illness.     All other systems reviewed and are negative.  EKGs/Labs/Other Studies Reviewed:    The following studies were reviewed today:   EKG:     Recent Labs: 11/06/2023: ALT 13; BUN 12; Creatinine 0.79; Potassium 3.6; Sodium 139 01/22/2024: Hemoglobin 12.8; Platelets 166.0; TSH 4.59  Recent Lipid Panel    Component Value Date/Time   CHOL 162 01/22/2024 1630   TRIG 57.0 01/22/2024 1630   HDL 45.50 01/22/2024 1630   CHOLHDL 4 01/22/2024 1630   VLDL 11.4 01/22/2024 1630   LDLCALC 105 (H) 01/22/2024 1630     Risk Assessment/Calculations:           Physical Exam:     Physical Exam: Blood pressure 122/76, pulse (!) 42, height 6\' 3"  (1.905 m), weight 232 lb 3.2 oz (105.3 kg), SpO2 95%.       GEN:  Well nourished, well developed in no acute distress HEENT: Normal NECK: No JVD; No carotid bruits LYMPHATICS: No lymphadenopathy CARDIAC: RRR , no murmurs, rubs, gallops RESPIRATORY:  Clear to auscultation without rales, wheezing or rhonchi  ABDOMEN: Soft, non-tender,  non-distended MUSCULOSKELETAL:  No edema; No deformity  SKIN: Warm and dry NEUROLOGIC:  Alert and oriented x 3   ASSESSMENT:    1. Frequent unifocal PVCs   2. Dyslipidemia, goal LDL below 70      PLAN:  Frequent premature ventricular contractions:  His PVC burden is 19%.  Echo continues to show normal LV function.  Will continue to follow up in 1 year .         Medication Adjustments/Labs and Tests Ordered: Current medicines are reviewed at length with the patient today.  Concerns regarding medicines are outlined above.  No orders of the defined types were placed in this encounter.  No orders of the defined types were placed in this encounter.   Patient Instructions  Medication Instructions:  Your physician recommends that you continue on your current medications as directed. Please refer to the Current Medication list given to you today.  *If you need a refill on your cardiac medications before your next appointment, please call your pharmacy*  Follow-Up: At [facility], you and your health needs are our priority.  As part of our continuing mission to provide you with exceptional heart care, we have created designated Provider Care Teams.  These Care Teams include your primary Cardiologist (physician) and Advanced Practice Providers (APPs -  Physician Assistants and Nurse Practitioners) who all work together to provide you with the care you need, when you need it.  Your next appointment:   1 year(s)  The format for your next appointment:   In Person  Provider:   Ahmad Alert, MD {  Other Instructions   1st Floor: - Lobby - Registration  - Pharmacy  - Lab - Cafe  2nd Floor: - PV Lab - Diagnostic Testing (echo, CT, nuclear med)  3rd Floor: - Vacant  4th Floor: - TCTS (cardiothoracic surgery) - AFib Clinic - Structural Heart Clinic - Vascular Surgery  - Vascular Ultrasound  5th Floor: - HeartCare Cardiology (general and EP) -  Clinical Pharmacy for coumadin, hypertension, lipid, weight-loss medications, and med management appointments    Valet parking services will be available as well.     Signed, Ahmad Alert, MD  02/10/2024 12:07 PM    [facility]

## 2024-02-10 ENCOUNTER — Ambulatory Visit: Attending: Cardiovascular Disease | Admitting: Cardiovascular Disease

## 2024-02-10 ENCOUNTER — Encounter: Payer: Self-pay | Admitting: Cardiovascular Disease

## 2024-02-10 VITALS — BP 122/76 | HR 42 | Ht 75.0 in | Wt 232.2 lb

## 2024-02-10 DIAGNOSIS — E785 Hyperlipidemia, unspecified: Secondary | ICD-10-CM | POA: Diagnosis not present

## 2024-02-10 DIAGNOSIS — I493 Ventricular premature depolarization: Secondary | ICD-10-CM

## 2024-02-10 NOTE — Patient Instructions (Addendum)
 Medication Instructions:  Your physician recommends that you continue on your current medications as directed. Please refer to the Current Medication list given to you today.  *If you need a refill on your cardiac medications before your next appointment, please call your pharmacy*  Follow-Up: At [facility], you and your health needs are our priority.  As part of our continuing mission to provide you with exceptional heart care, we have created designated Provider Care Teams.  These Care Teams include your primary Cardiologist (physician) and Advanced Practice Providers (APPs -  Physician Assistants and Nurse Practitioners) who all work together to provide you with the care you need, when you need it.  Your next appointment:   1 year(s)  The format for your next appointment:   In Person  Provider:   Kristeen Miss, MD {  Other Instructions   1st Floor: - Lobby - Registration  - Pharmacy  - Lab - Cafe  2nd Floor: - PV Lab - Diagnostic Testing (echo, CT, nuclear med)  3rd Floor: - Vacant  4th Floor: - TCTS (cardiothoracic surgery) - AFib Clinic - Structural Heart Clinic - Vascular Surgery  - Vascular Ultrasound  5th Floor: - HeartCare Cardiology (general and EP) - Clinical Pharmacy for coumadin, hypertension, lipid, weight-loss medications, and med management appointments    Valet parking services will be available as well.

## 2024-02-12 ENCOUNTER — Ambulatory Visit (HOSPITAL_BASED_OUTPATIENT_CLINIC_OR_DEPARTMENT_OTHER): Attending: Physician Assistant | Admitting: Physical Therapy

## 2024-02-12 ENCOUNTER — Encounter (HOSPITAL_BASED_OUTPATIENT_CLINIC_OR_DEPARTMENT_OTHER): Payer: Self-pay | Admitting: Physical Therapy

## 2024-02-12 DIAGNOSIS — M25652 Stiffness of left hip, not elsewhere classified: Secondary | ICD-10-CM | POA: Diagnosis not present

## 2024-02-12 DIAGNOSIS — R2689 Other abnormalities of gait and mobility: Secondary | ICD-10-CM

## 2024-02-12 DIAGNOSIS — R2681 Unsteadiness on feet: Secondary | ICD-10-CM

## 2024-02-12 DIAGNOSIS — M25662 Stiffness of left knee, not elsewhere classified: Secondary | ICD-10-CM | POA: Diagnosis not present

## 2024-02-12 NOTE — Therapy (Deleted)
 OUTPATIENT PHYSICAL THERAPY NEURO TREATMENT    Patient Name: Michael [name] MRN: [id number] DOB:[birth year], 68 y.o., male Today's Date: 02/12/2024   PCP: Arcadio Knuckles, MD REFERRING PROVIDER: Persons, Norma Beckers, [place]   END OF SESSION:   Past Medical History:  Diagnosis Date   Hyperlipidemia    Hypertension    Lumbar disc disease    Prostate cancer [facility])    Past Surgical History:  Procedure Laterality Date   LUMBAR DISC SURGERY  2014   LUMBAR DISC SURGERY     right ankle fracture  1978   external pinning   Patient Active Problem List   Diagnosis Date Noted   Anemia due to zinc deficiency 01/28/2024   Deficiency anemia 01/22/2024   Bradycardia, sinus 01/22/2024   Screening for colon cancer 01/22/2024   Need for prophylactic vaccination with combined diphtheria-tetanus-pertussis (DTP) vaccine 01/22/2024   Frequent unifocal PVCs 01/22/2024   MGUS (monoclonal gammopathy of unknown significance) 06/10/2023   RBC microcytosis 06/10/2023   DDD (degenerative disc disease), lumbar 12/21/2022   Prostate cancer (HCC); T1c; GG 1; initial diagnosis 2011 12/05/2022   Chronic hyperglycemia 11/16/2022   Dyslipidemia, goal LDL below 70 09/23/2022   Elevated PSA, between 10 and less than 20 ng/ml 09/17/2022   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   Vitamin D deficiency 09/07/2021    ONSET DATE: symptoms began in 2020; pt had a fall August 23, 2023  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  Rationale for Evaluation and Treatment: Rehabilitation  SUBJECTIVE:                                                                                                                                                                                             SUBJECTIVE STATEMENT: He informs PT that he tried to message this therapist with latest message to aquatic therapist to report his walking.  He states this PT was not listed in his chart  and he did not know how to fix it (PT informed him he can always call clinic and leave a message for therapist as sometimes multiple providers within this discipline will not show up for patient).  He requests to warmup on the bike today as he has been prioritizing walking.  Pt accompanied by: self (he continues to drive himself)  PERTINENT HISTORY: degenerative disc disease (followed by [facility]) s/p L5-S1 surgery, HLD, HTN, prostate cancer  Patient's symptoms started in 2020 - right flank pain (reported initially as abdominal pain, but pt clarifies this is not a deep cramp/stomach pain, but a muscular soreness especially with  movement), core weakness (not able to sit up well), sneezing and coughing was painful as well.  A year later he noted stiffness in his legs after sitting where he has to walk up his legs with his hands.  Now, he reports numbness and soreness in left hamstring area as well as swelling and muscle spasms (left worse).  In order to walk he has to weight shift and bring feet into narrowed BOS then start with small steps.  He loosens up with movement. He reports freezing when startled. Ambulates w/ SPC.  Upper body remains WNL.  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  Pt has a fall August 23, 2023 halting his prior PT episode due to broken ribs.  PAIN:  Are you having pain? No  PRECAUTIONS: Fall  RED FLAGS: None-pt does report more urinary urgency that he reports corresponds with the reported itch in his legs and low back, but he has discussed this with his physician (Dr. [name])  WEIGHT BEARING RESTRICTIONS: No  FALLS: Has patient fallen in last 6 months? Yes. 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  He modifies some homemaking tasks, but reports he can stand longer to do things like cooking.  PATIENT GOALS: Work on upper body strength  OBJECTIVE:   DIAGNOSTIC FINDINGS:  EMG on 05/14/23 showed an active left S1 radiculopathy (similar to prior EMG)  X-ray Bone Survey Met (11/06/2023):   IMPRESSION: No evidence of lytic or destructive bone lesion  X-ray Ribs Unilateral Right (09/04/2023): Radiographs of his right ribs demonstrate no more blunting of the  costophrenic angle he does have vascular markings going out to the  periphery.   COGNITION: Overall cognitive status: Within functional limits for tasks assessed and pt hyperverbal   SENSATION: Light touch: WFL  COORDINATION: LE RAMS:  slow and deliberate Bilateral Heel-to-shin:  limited by lack of bilateral ER  EDEMA:  bilateral ankle edema (baseline per pt - he has been using castor oil and lavender to improve skin integrity - MD aware of regimen)  MUSCLE TONE: None noted in BLE  POSTURE: weight shift right  LOWER EXTREMITY ROM:     Active  Right Eval Left Eval  Hip flexion Grossly WFL - limited bilateral ER (basically at neutral  Hip extension   Hip abduction   Hip adduction   Hip internal rotation   Hip external rotation   Knee flexion   Knee extension   Ankle dorsiflexion   Ankle plantarflexion    Ankle inversion    Ankle eversion     (Blank rows = not tested)  LOWER EXTREMITY MMT:    MMT Right Eval Left Eval  Hip flexion 3/5 3+/5  Hip extension    Hip abduction    Hip adduction    Hip internal rotation    Hip external rotation    Knee flexion    Knee extension  4+/5 4+/5  Ankle dorsiflexion 3+/5 4/5  Ankle plantarflexion  Ankle inversion    Ankle eversion    (Blank rows = not tested)  BED MOBILITY:  Pt reports his bed mobility has greatly improved and he has switched his setup so he enters and exits bed on side closest to his bathroom  TRANSFERS: Assistive device utilized: None  Sit to stand: Modified independence - uses hands Stand to sit: Modified independence Chair to chair: Complete Independence Floor:  pt performed modI on most recent fall  GAIT: Gait pattern: step through pattern, decreased step length- Right, decreased step length- Left, decreased stride length, decreased hip/knee flexion- Right, decreased hip/knee flexion- Left, lateral lean- Right, decreased trunk rotation, and narrow BOS Distance walked: various clinic distances Assistive device utilized: None Level of assistance: SBA Comments: No notable ataxia or involuntary movements during ambulation into/out of clinic.  FUNCTIONAL TESTS:  5 times sit to stand: 24.72 seconds w/ light BUE support - wide stance with some crouching into tall stand due to imbalance Timed up and go (TUG): To be assessed. 10 meter walk test: To be assessed.  PATIENT SURVEYS:  ABC scale To be assessed.  TODAY'S TREATMENT:                                                                                                                              -SciFit using BUE/BLE support on single progressive peak mode to level 7.0 over 8 minutes for large amplitude reciprocal mobility and global strengthening and tolerance.  RPE 8/10 following task. -Standing incline board gastroc stretch attempting to get R heel to ground, unable following prolonged stretch -Decline board unsupported ball catch > naming noun for ABCs (pt has x5 errors and several instances of paused activity) > decline squats w/ intermittent touch support for balance x12 -Pt ambulates from SciFit to outdoor unlevel sidewalk to grassy  incline then back to clinic (~800 ft) mod I using bilateral trekking poles, discussed potential benefit for his longer walks especially like that of his recent walk to [employer] where he was fearful of potholes and other obstacles, he demonstrates safe 2 pt gait over various surfaces, discussed staggered pattern for incline/decline to widen BOS as needed for stability, per reported interest in OOP purchase PT prints resources for pt - discussed benefit of adjustable one with rubber tip and possibly foldable as pt doesn't need AD at all times and this would make transporting it easier.  PATIENT EDUCATION: Education details: Continue HEP w/ additions and chair yoga.  Continue walking program.  Discussed moving last aquatic appt to land with this therapist due to anticipated maximized potential in aquatic setting in 2-3 visits - pt agreeable to change.  Trekking pole resource. Person educated: Patient Education method: Explanation Education comprehension: verbalized understanding and needs further education  HOME EXERCISE PROGRAM: Review from episode prior - modify as needed:  Access Code: [email or web address]  You Can Walk For A Certain Length Of Time Each Day (can start with 3-4 days and work up to  everyday - use cane for safety and walk in well lit familiar areas with smooth surfaces)                          Walk 5 minutes 2 times per day.             Increase 2  minutes every 7 days              Work up to 20 minutes (1x per day).               Example:                         Day 1-2           4-5 minutes     3 times per day                         Day 7-8           10-12 minutes 2-3 times per day                         Day 13-14       20-22 minutes 1-2 times per day  Chair yoga poses 1-6, 8-9 (modified position 9 and held x10 seconds), 12  GOALS: Goals reviewed with patient? Yes  SHORT TERM GOALS: Target date: 12/27/2023  Pt will decrease 5xSTS to </=19.72 seconds with improved immediate standing  balance in order to demonstrate decreased risk for falls and improved functional bilateral LE strength and power. Baseline:  24.72 seconds w/ light BUE support; 15.97 sec w/ BUE support (3/3) Goal status: MET  2.  Pt will demonstrate TUG of </=17.09 seconds in order to decrease risk of falls and improve functional mobility using LRAD. Baseline: 22.09 sec no AD SBA (2/3); 12.86 sec no AD SBA (3/3) Goal status: MET  3.  Pt will demonstrate a gait speed of >/=2.52 feet/sec in order to decrease risk for falls. Baseline: 2.32 ft/sec (2/3); 2.74 ft/sec (3/3) Goal status: MET  LONG TERM GOALS: Target date: 01/24/2024  Pt will be independent and compliant with strength, stretching, and balance focused land-based HEP and aquatic HEP if continuing following discharge in order to maintain functional progress and improve mobility. Baseline:  IND/compliant per report (3/25) Goal status: MET  2.  Pt will decrease 5xSTS to </=14.72 seconds in order to demonstrate decreased risk for falls and improved functional bilateral LE strength and power. Baseline: 24.72 seconds w/ light BUE support; 16.47 sec w/ intermittent touch to initiate stand (3/25) Goal status: PARTIALLY MET  3.  Pt will demonstrate TUG of </=12.09 seconds in order to decrease risk of falls and improve functional mobility using LRAD. Baseline: 22.09 sec no AD SBA (2/3); 12.22 sec IND (3/25) Goal status: PARTIALLY MET  4.  Pt will demonstrate a gait speed of >/=2.92 feet/sec in order to decrease risk for falls. Baseline: 2.32 ft/sec (2/3); 2.74 ft/sec (3/3); 2.85 ft/sec (3/25) Goal status: PARTIALLY MET  5.  Pt will ambulate >/=800 feet at IND level over unlevel outdoor surfaces and grass to promote household and community access. Baseline: 1000 ft over unlevel and grass IND (3/25) Goal status: MET  6.  Pt will improve ABC Scale to >/= 69.4% in order to demonstrate improved fall risk and engagement in daily activities. Baseline: 59.4%  (2/3); 98.94% (3/25)  Goal status: MET  GOALS (re-cert 1/61) : Goals reviewed with patient? Yes  SHORT TERM GOALS: Target date: 02/14/2024  Pt will be independent and compliant with aquatic HEP in order to maintain functional progress and improve mobility. Baseline:  To be established. Goal status: INITIAL  2.  Pt will demonstrate a gait speed of >/=2.92 feet/sec in order to decrease risk for falls. Baseline: 2.32 ft/sec (2/3); 2.74 ft/sec (3/3); 2.85 ft/sec (3/25) Goal status: INITIAL  LONG TERM GOALS: Target date: 03/06/2024  1.  Pt will decrease 5xSTS to </=14.72 seconds in order to demonstrate decreased risk for falls and improved functional bilateral LE strength and power. Baseline: 24.72 seconds w/ light BUE support; 16.47 sec w/ intermittent touch to initiate stand (3/25) Goal status: INITIAL  2.  Pt will demonstrate TUG of </=10 seconds in order to decrease risk of falls and improve functional mobility using LRAD. Baseline: 22.09 sec no AD SBA (2/3); 12.22 sec IND (3/25) Goal status: REVISED  3.  Pt will demonstrate a gait speed of >/=3.12 feet/sec in order to decrease risk for falls. Baseline: 2.32 ft/sec (2/3); 2.74 ft/sec (3/3); 2.85 ft/sec (3/25) Goal status:  REVISED  ASSESSMENT:  CLINICAL IMPRESSION: Focus of skilled session today on continuing to improve fluidity of gait and pt's ability to safely traverse the community.  He does well with trekking poles on incline and decline today and was provided options for purchase should he choose.  Pt continues to be limited by some ROM limitations especially in right ankle and ongoing stiffness.  He continues to benefit from skilled PT in this setting to improve general upright mobility.  OBJECTIVE IMPAIRMENTS: decreased activity tolerance, decreased balance, decreased coordination, decreased endurance, decreased knowledge of condition, decreased mobility, difficulty walking, decreased strength, increased edema, and increased  muscle spasms.   ACTIVITY LIMITATIONS: carrying, lifting, bending, sitting, standing, squatting, stairs, transfers, bed mobility, and locomotion level  PARTICIPATION LIMITATIONS: shopping, community activity, and yard work  PERSONAL FACTORS: Age, Fitness, and 1-2 comorbidities: HTN, ongoing S1 radiculopathy  are also affecting patient's functional outcome.   REHAB POTENTIAL: Excellent  CLINICAL DECISION MAKING: Evolving/moderate complexity  EVALUATION COMPLEXITY: Moderate  PLAN:  PT FREQUENCY: 2x/week (1 land and 1 aquatic for initial 3 weeks of re-cert)  PT DURATION: 8 weeks + 6 weeks (0/96 re-cert)  PLANNED INTERVENTIONS: [id number]- PT Re-evaluation, 97110-Therapeutic exercises, 97530- Therapeutic activity, 97112- Neuromuscular re-education, 97535- Self Care, 98119- Manual therapy, [phone]- Gait training, [phone]- Orthotic Fit/training, [phone]- Aquatic Therapy, Patient/Family education, Balance training, Stair training, Taping, Dry Needling, Joint mobilization, Spinal mobilization, Vestibular training, DME instructions, Moist heat, Therapeutic exercises, Therapeutic activity, Neuromuscular re-education, Gait training, and Self Care  PLAN FOR NEXT SESSION: Modify land HEP for core and LE strength, stretching, and balance.  SciFit for reciprocal mobility.  Reaction time - blaze pods, balloon taps on compliant surfaces, timed puzzles on compliant surface.   Upper body strength. Hip flexor strength.  Treadmill training.  Standing windmills vs modified w/ unilateral UE support.  R adductor strengthening - card slides across midline  Aquatics:  please establish simple aquatic HEP per pt request  Marilou Showman, PT, DPT                  OUTPATIENT PHYSICAL THERAPY NEURO TREATMENT   Patient Name: Michael [name] MRN: [id number] DOB:June 09, 1956, 66 y.o., male Today's Date: 02/12/2024   PCP: Arcadio Knuckles, MD REFERRING PROVIDER: Persons, Norma Beckers, [place]   END OF  SESSION:    Past Medical History:  Diagnosis Date   Hyperlipidemia    Hypertension    Lumbar disc disease    Prostate cancer [facility])    Past Surgical History:  Procedure Laterality Date   LUMBAR DISC SURGERY  2014   LUMBAR DISC SURGERY     right ankle fracture  1978   external pinning   Patient Active Problem List   Diagnosis Date Noted   Anemia due to zinc deficiency 01/28/2024   Deficiency anemia 01/22/2024   Bradycardia, sinus 01/22/2024   Screening for colon cancer 01/22/2024   Need for prophylactic vaccination with combined diphtheria-tetanus-pertussis (DTP) vaccine 01/22/2024   Frequent unifocal PVCs 01/22/2024   MGUS (monoclonal gammopathy of unknown significance) 06/10/2023   RBC microcytosis 06/10/2023   DDD (degenerative disc disease), lumbar 12/21/2022   Prostate cancer (HCC); T1c; GG 1; initial diagnosis 2011 12/05/2022   Chronic hyperglycemia 11/16/2022   Dyslipidemia, goal LDL below 70 09/23/2022   Elevated PSA, between 10 and less than 20 ng/ml 09/17/2022   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   Vitamin D deficiency 09/07/2021    ONSET DATE: symptoms began in 2020; pt had a fall August 23, 2023  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  Rationale for Evaluation and Treatment: Rehabilitation  SUBJECTIVE:                                                                                                                                                                                             SUBJECTIVE STATEMENT: "Walked 1.5 miles after last land session with brother and did well, no residual pain or excessive fatigue"  Pt accompanied by: self (he continues to drive himself)  PERTINENT HISTORY: degenerative disc disease (followed by [facility]) s/p L5-S1 surgery, HLD, HTN, prostate cancer  Patient's symptoms started in 2020 - right flank pain (reported initially as abdominal pain, but pt  clarifies this is not a deep cramp/stomach pain, but a muscular soreness especially with movement), core weakness (not able to sit up well), sneezing and coughing was painful as well.  A year later he noted stiffness in his legs after sitting where he has to walk up his legs with his hands.  Now, he reports numbness and soreness in left hamstring area as well as swelling and muscle spasms (left worse).  In order to walk he has to weight shift and bring feet into narrowed BOS then start with small steps.  He loosens up with movement. He reports freezing when startled. Ambulates w/ SPC.  Upper body remains WNL.  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  Pt has a fall August 23, 2023 halting his prior PT episode due to broken ribs.  PAIN:  Are you having pain? No - "a little stiffness in my left leg"  PRECAUTIONS: Fall  RED FLAGS: None-pt does report more urinary urgency that he reports corresponds with the reported itch in his legs and low back, but he has discussed this with his physician (Dr. Genita Keys)  WEIGHT BEARING RESTRICTIONS: No  FALLS: Has patient fallen in last 6 months? Yes. 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  He modifies some homemaking tasks, but reports he can stand longer to do things like cooking.  PATIENT GOALS: Work on upper body strength  OBJECTIVE:   DIAGNOSTIC FINDINGS:  EMG on 05/14/23 showed an  active left S1 radiculopathy (similar to prior EMG)  X-ray Bone Survey Met (11/06/2023):   IMPRESSION: No evidence of lytic or destructive bone lesion  X-ray Ribs Unilateral Right (09/04/2023): Radiographs of his right ribs demonstrate no more blunting of the  costophrenic angle he does have vascular markings going out to the  periphery.   COGNITION: Overall cognitive status: Within functional limits for tasks assessed and pt hyperverbal   SENSATION: Light touch: WFL  COORDINATION: LE RAMS:  slow and deliberate Bilateral Heel-to-shin:  limited by lack of bilateral ER  EDEMA:  bilateral ankle edema (baseline per pt - he has been using castor oil and lavender to improve skin integrity - MD aware of regimen)  MUSCLE TONE: None noted in BLE  POSTURE: weight shift right  LOWER EXTREMITY ROM:     Active  Right Eval Left Eval  Hip flexion Grossly WFL - limited bilateral ER (basically at neutral  Hip extension   Hip abduction   Hip adduction   Hip internal rotation   Hip external rotation   Knee flexion   Knee extension   Ankle dorsiflexion   Ankle plantarflexion    Ankle inversion    Ankle eversion     (Blank rows = not tested)  LOWER EXTREMITY MMT:    MMT Right Eval Left Eval  Hip flexion 3/5 3+/5  Hip extension    Hip abduction    Hip adduction    Hip internal rotation    Hip external rotation    Knee flexion    Knee extension 4+/5 4+/5  Ankle dorsiflexion 3+/5 4/5  Ankle plantarflexion    Ankle inversion    Ankle eversion    (Blank rows = not tested)  BED MOBILITY:  Pt reports his bed mobility has greatly improved and he has switched his setup so he enters and exits bed on side closest to his bathroom  TRANSFERS: Assistive device utilized: None  Sit to stand: Modified independence - uses hands Stand to sit: Modified independence Chair to chair: Complete Independence Floor:  pt performed modI on most recent fall  GAIT: Gait pattern: step through  pattern, decreased step length- Right, decreased step length- Left, decreased stride length, decreased hip/knee flexion- Right,  decreased hip/knee flexion- Left, lateral lean- Right, decreased trunk rotation, and narrow BOS Distance walked: various clinic distances Assistive device utilized: None Level of assistance: SBA Comments: No notable ataxia or involuntary movements during ambulation into/out of clinic.  FUNCTIONAL TESTS:  5 times sit to stand: 24.72 seconds w/ light BUE support - wide stance with some crouching into tall stand due to imbalance Timed up and go (TUG): To be assessed. 10 meter walk test: To be assessed.  PATIENT SURVEYS:  ABC scale To be assessed.  TODAY'S TREATMENT:                                                                                                                              Pt seen for aquatic therapy today.  Treatment took place in water 3.5-4.75 ft in depth at the [facility] pool. Temp of water was 91.  Pt entered/exited the pool via stairs with hand rail.   *walking forward, back and side stepping unsupported *side lunges ue add/abd yellow HB multiple wodths *tandem stance 4.8 ft ue add/abd using yellow HB *tandem stance ue support yellow HB with VE leading R/L x 15s *SLS 4.8 ue add/abd yellow HB (difficult-good challenge) *Runners step up/down on bottom step x 10 R/L-> onto 2nd step R/L x5 *STS from 3rd step (unable)-> from bench onto water step 10 with slow descent.  Cues for immediate standing balance  -completed  x5 with VE -TrA sets using yellow noodle pull downs wide stance then staggered 8-10rep (good challenge) -hip flex stretch using noodle  Pt requires the buoyancy and hydrostatic pressure of water for support, and to offload joints by unweighting joint load by at least 50 % in navel deep water and by at least 75-80% in chest to neck deep water.  Viscosity of the water is needed for resistance of strengthening. Water current  perturbations provides challenge to standing balance requiring increased core activation.  PATIENT EDUCATION: Education details: Continue HEP w/ additions and chair yoga.  Continue walking program.  Person educated: Patient Education method: Explanation Education comprehension: verbalized understanding and needs further education  HOME EXERCISE PROGRAM: Review from episode prior - modify as needed:  Access Code: [email or web address]  You Can Walk For A Certain Length Of Time Each Day (can start with 3-4 days and work up to everyday - use cane for safety and walk in well lit familiar areas with smooth surfaces)                          Walk 5 minutes 2 times per day.             Increase 2  minutes every 7 days              Work up to 20 minutes (1x per day).               Example:  Day 1-2           4-5 minutes     3 times per day                         Day 7-8           10-12 minutes 2-3 times per day                         Day 13-14       20-22 minutes 1-2 times per day  Chair yoga poses 1-6, 8-9 (modified position 9 and held x10 seconds), 12  GOALS: Goals reviewed with patient? Yes  SHORT TERM GOALS: Target date: 12/27/2023  Pt will decrease 5xSTS to </=19.72 seconds with improved immediate standing balance in order to demonstrate decreased risk for falls and improved functional bilateral LE strength and power. Baseline:  24.72 seconds w/ light BUE support; 15.97 sec w/ BUE support (3/3) Goal status: MET  2.  Pt will demonstrate TUG of </=17.09 seconds in order to decrease risk of falls and improve functional mobility using LRAD. Baseline: 22.09 sec no AD SBA (2/3); 12.86 sec no AD SBA (3/3) Goal status: MET  3.  Pt will demonstrate a gait speed of >/=2.52 feet/sec in order to decrease risk for falls. Baseline: 2.32 ft/sec (2/3); 2.74 ft/sec (3/3) Goal status: MET  LONG TERM GOALS: Target date: 01/24/2024  Pt will be independent and compliant with strength,  stretching, and balance focused land-based HEP and aquatic HEP if continuing following discharge in order to maintain functional progress and improve mobility. Baseline:  To be established. Goal status: INITIAL  2.  Pt will decrease 5xSTS to </=14.72 seconds in order to demonstrate decreased risk for falls and improved functional bilateral LE strength and power. Baseline: 24.72 seconds w/ light BUE support Goal status: INITIAL  3.  Pt will demonstrate TUG of </=12.09 seconds in order to decrease risk of falls and improve functional mobility using LRAD. Baseline: 22.09 sec no AD SBA (2/3) Goal status: INITIAL  4.  Pt will demonstrate a gait speed of >/=2.92 feet/sec in order to decrease risk for falls. Baseline: 2.32 ft/sec (2/3); 2.74 ft/sec (3/3) Goal status: REVISED  5.  Pt will ambulate >/=800 feet at IND level over unlevel outdoor surfaces and grass to promote household and community access. Baseline: To be assessed. Goal status: INITIAL  6.  Pt will improve ABC Scale to >/= 69.4% in order to demonstrate improved fall risk and engagement in daily activities. Baseline: 59.4% (2/3) Goal status: INITIAL  ASSESSMENT:  CLINICAL IMPRESSION: Progressed addressing core engagement and stabilization in visually limited conditions with good toleration.Treatment highly effective with pt enjoying intervention. He will continue to benefit from skilled aquatic intervention for 2-3 more session to continue work on balance and core strengthening. Pt uncertain if he will have pool access to require an aquatic HEP.   OBJECTIVE IMPAIRMENTS: decreased activity tolerance, decreased balance, decreased coordination, decreased endurance, decreased knowledge of condition, decreased mobility, difficulty walking, decreased strength, increased edema, and increased muscle spasms.   ACTIVITY LIMITATIONS: carrying, lifting, bending, sitting, standing, squatting, stairs, transfers, bed mobility, and locomotion  level  PARTICIPATION LIMITATIONS: shopping, community activity, and yard work  PERSONAL FACTORS: Age, Fitness, and 1-2 comorbidities: HTN, ongoing S1 radiculopathy  are also affecting patient's functional outcome.   REHAB POTENTIAL: Excellent  CLINICAL DECISION MAKING: Evolving/moderate complexity  EVALUATION COMPLEXITY: Moderate  PLAN:  PT  FREQUENCY: 2x/week (1 land and 1 aquatic)  PT DURATION: 8 weeks  PLANNED INTERVENTIONS: 97164- PT Re-evaluation, 97110-Therapeutic exercises, 97530- Therapeutic activity, 97112- Neuromuscular re-education, 97535- Self Care, 16109- Manual therapy, [phone]- Gait training, [phone]- Orthotic Fit/training, [phone]- Aquatic Therapy, Patient/Family education, Balance training, Stair training, Taping, Dry Needling, Joint mobilization, Spinal mobilization, Vestibular training, DME instructions, Moist heat, Therapeutic exercises, Therapeutic activity, Neuromuscular re-education, Gait training, and Self Care  PLAN FOR NEXT SESSION: Modify land HEP for core and LE strength, stretching, and balance.  SciFit for reciprocal mobility.  Reaction time - blaze pods, balloon taps on compliant surfaces, timed puzzles on compliant surface.   Upper body strength. Hip flexor strength.  Treadmill training. incline board balance.  Walking stick on outdoor incline - staggered walking down decline - print resource for pt.  Standing windmills vs modified w/ unilateral UE support.  R adductor strengthening  RE-CERT/LTG ASSESSMENT (aquatic visits?)  Aquatics:  pt may not need aquatic HEP - unsure of transition into community pool - may need a list of community options (land PT can provide copy at next appt if needed), balance - ai chi and general strength/stretching of core, shoulders and hips, upright posture, walking tolerance; PT anticipates pt should be able to manage stairs at no more than supervision level  Adriana Hopping Laneta Pintos) Geanna Divirgilio MPT 02/12/24 2:56 PM [facility]  [facility] [address] [place], [place], [address] Phone: [phone]   Fax:  [phone]

## 2024-02-12 NOTE — Therapy (Signed)
 OUTPATIENT PHYSICAL THERAPY NEURO TREATMENT    Patient Name: Michael [name] MRN: [id number] DOB:September 16, 1956, 68 y.o., male Today's Date: 02/12/2024   PCP: Arcadio Knuckles, MD REFERRING PROVIDER: Persons, Norma Beckers, [place]   END OF SESSION:  PT End of Session - 02/12/24 1547     Visit Number 16    Number of Visits 29   17 + 12   Date for PT Re-Evaluation 03/20/24   pushed out due to aquatic needs   Authorization Type [employer] MEDICARE    Progress Note Due on Visit 10    PT Start Time 1446    PT Stop Time 1529    PT Time Calculation (min) 43 min    Equipment Utilized During Treatment Gait belt    Activity Tolerance Patient tolerated treatment well    Behavior During Therapy WFL for tasks assessed/performed             Past Medical History:  Diagnosis Date   Hyperlipidemia    Hypertension    Lumbar disc disease    Prostate cancer (HCC)    Past Surgical History:  Procedure Laterality Date   LUMBAR DISC SURGERY  2014   LUMBAR DISC SURGERY     right ankle fracture  1978   external pinning   Patient Active Problem List   Diagnosis Date Noted   Anemia due to zinc deficiency 01/28/2024   Deficiency anemia 01/22/2024   Bradycardia, sinus 01/22/2024   Screening for colon cancer 01/22/2024   Need for prophylactic vaccination with combined diphtheria-tetanus-pertussis (DTP) vaccine 01/22/2024   Frequent unifocal PVCs 01/22/2024   MGUS (monoclonal gammopathy of unknown significance) 06/10/2023   RBC microcytosis 06/10/2023   DDD (degenerative disc disease), lumbar 12/21/2022   Prostate cancer (HCC); T1c; GG 1; initial diagnosis 2011 12/05/2022   Chronic hyperglycemia 11/16/2022   Dyslipidemia, goal LDL below 70 09/23/2022   Elevated PSA, between 10 and less than 20 ng/ml 09/17/2022   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   Vitamin D deficiency 09/07/2021    ONSET DATE: symptoms began in 2020; pt had a fall August 23, 2023  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  THERAPY DIAG:  Unsteadiness on feet  Stiffness of left knee, not elsewhere classified  Stiffness of left hip, not elsewhere classified  Other abnormalities of gait and mobility  Rationale for Evaluation and Treatment: Rehabilitation  SUBJECTIVE:                                                                                                                                                                                             SUBJECTIVE STATEMENT: Pt  reports he is planting his garden without difficulty and enjoying it  Pt accompanied by: self (he continues to drive himself)  PERTINENT HISTORY: degenerative disc disease (followed by [facility]) s/p L5-S1 surgery, HLD, HTN, prostate cancer  Patient's symptoms started in 2020 - right flank pain (reported initially as abdominal pain, but pt clarifies this is not a deep cramp/stomach pain, but a muscular soreness especially with movement), core weakness (not able to sit up well), sneezing and coughing was painful as well.  A year later he noted stiffness in his legs after sitting where he has to walk up his legs with his hands.  Now, he reports numbness and soreness in left hamstring area as well as swelling and muscle spasms (left worse).  In order to walk he has to weight shift and bring feet into narrowed BOS then start with small steps.  He loosens up with movement. He reports freezing when startled. Ambulates w/ SPC.  Upper body remains WNL.  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  Pt has a fall August 23, 2023 halting his prior PT episode due to broken ribs.  PAIN:  Are you having pain? No  PRECAUTIONS: Fall  RED FLAGS: None-pt does report more urinary urgency that he reports corresponds with the reported itch in his legs and low back, but he has discussed this with his physician (Dr. [name])  WEIGHT  BEARING RESTRICTIONS: No  FALLS: Has patient fallen in last 6 months? Yes. 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  He modifies some homemaking tasks, but reports he can stand longer to do things like cooking.  PATIENT GOALS: Work on upper body strength  OBJECTIVE:   DIAGNOSTIC FINDINGS:  EMG on 05/14/23 showed an active left S1 radiculopathy (similar to prior EMG)  X-ray Bone Survey Met (11/06/2023):   IMPRESSION: No evidence of lytic or destructive bone lesion  X-ray Ribs Unilateral Right (09/04/2023): Radiographs of his right ribs demonstrate no more blunting of the  costophrenic angle he does have vascular markings going out to the  periphery.   COGNITION: Overall cognitive status: Within functional limits for tasks assessed and pt hyperverbal   SENSATION: Light touch: WFL  COORDINATION: LE RAMS:  slow and deliberate Bilateral Heel-to-shin:  limited by lack of bilateral ER  EDEMA:  bilateral ankle edema (baseline per pt - he has been using castor oil and lavender to improve skin integrity - MD aware of regimen)  MUSCLE TONE: None noted in BLE  POSTURE: weight shift right  LOWER EXTREMITY ROM:     Active  Right Eval Left Eval  Hip flexion Grossly WFL - limited bilateral ER (basically at neutral  Hip extension   Hip abduction   Hip adduction   Hip internal rotation   Hip external rotation   Knee flexion   Knee extension   Ankle dorsiflexion   Ankle plantarflexion    Ankle inversion     Ankle eversion     (  Blank rows = not tested)  LOWER EXTREMITY MMT:    MMT Right Eval Left Eval  Hip flexion 3/5 3+/5  Hip extension    Hip abduction    Hip adduction    Hip internal rotation    Hip external rotation    Knee flexion    Knee extension 4+/5 4+/5  Ankle dorsiflexion 3+/5 4/5  Ankle plantarflexion    Ankle inversion    Ankle eversion    (Blank rows = not tested)  BED MOBILITY:  Pt reports his bed mobility has greatly improved and he has switched his setup so he enters and exits bed on side closest to his bathroom  TRANSFERS: Assistive device utilized: None  Sit to stand: Modified independence - uses hands Stand to sit: Modified independence Chair to chair: Complete Independence Floor:  pt performed modI on most recent fall  GAIT: Gait pattern: step through pattern, decreased step length- Right, decreased step length- Left, decreased stride length, decreased hip/knee flexion- Right, decreased hip/knee flexion- Left, lateral lean- Right, decreased trunk rotation, and narrow BOS Distance walked: various clinic distances Assistive device utilized: None Level of assistance: SBA Comments: No notable ataxia or involuntary movements during ambulation into/out of clinic.  FUNCTIONAL TESTS:  5 times sit to stand: 24.72 seconds w/ light BUE support - wide stance with some crouching into tall stand due to imbalance Timed up and go (TUG): To be assessed. 10 meter walk test: To be assessed.  PATIENT SURVEYS:  ABC scale To be assessed.  TODAY'S TREATMENT:    02/12/24                                                                                                                          Pt seen for aquatic therapy today.  Treatment took place in water 3.5-4.75 ft in depth at the [facility] pool. Temp of water was 91.  Pt entered/exited the pool via stairs with hand rail.   *walking forward, back and side stepping unsupported *side lunges ue add/abd yellow HB  multiple widths *SLS 4.8 ue add/abd yellow HB (difficult-good challenge) *TrA sets using yellow noodle pull downs wide stance then staggered 10rep (good challenge) *warrior 3 using yellow HB in 4.3 ft (good challenge) *Oblique noodle press using yellow noodle *Old Man stretch *Right QL stretch with resisted left side bending using blue HB 2 x 5 with hold x 5 secs *hip flex stretch using noodle   Pt requires the buoyancy and hydrostatic pressure of water for support, and to offload joints by unweighting joint load by at least 50 % in navel deep water and by at least 75-80% in chest to neck deep water.  Viscosity of the water is needed for resistance of strengthening. Water current perturbations provides challenge to standing balance requiring increased core activation.   PATIENT EDUCATION: Education details: Continue HEP w/ additions and chair yoga.  Continue walking program.  Discussed moving last aquatic appt to land with this therapist due  to anticipated maximized potential in aquatic setting in 2-3 visits - pt agreeable to change.  Trekking pole resource. Person educated: Patient Education method: Explanation Education comprehension: verbalized understanding and needs further education  HOME EXERCISE PROGRAM: Review from episode prior - modify as needed:  Access Code: [email or web address]  You Can Walk For A Certain Length Of Time Each Day (can start with 3-4 days and work up to everyday - use cane for safety and walk in well lit familiar areas with smooth surfaces)                          Walk 5 minutes 2 times per day.             Increase 2  minutes every 7 days              Work up to 20 minutes (1x per day).               Example:                         Day 1-2           4-5 minutes     3 times per day                         Day 7-8           10-12 minutes 2-3 times per day                         Day 13-14       20-22 minutes 1-2 times per day  Chair yoga poses 1-6, 8-9 (modified  position 9 and held x10 seconds), 12  GOALS: Goals reviewed with patient? Yes  SHORT TERM GOALS: Target date: 12/27/2023  Pt will decrease 5xSTS to </=19.72 seconds with improved immediate standing balance in order to demonstrate decreased risk for falls and improved functional bilateral LE strength and power. Baseline:  24.72 seconds w/ light BUE support; 15.97 sec w/ BUE support (3/3) Goal status: MET  2.  Pt will demonstrate TUG of </=17.09 seconds in order to decrease risk of falls and improve functional mobility using LRAD. Baseline: 22.09 sec no AD SBA (2/3); 12.86 sec no AD SBA (3/3) Goal status: MET  3.  Pt will demonstrate a gait speed of >/=2.52 feet/sec in order to decrease risk for falls. Baseline: 2.32 ft/sec (2/3); 2.74 ft/sec (3/3) Goal status: MET  LONG TERM GOALS: Target date: 01/24/2024  Pt will be independent and compliant with strength, stretching, and balance focused land-based HEP and aquatic HEP if continuing following discharge in order to maintain functional progress and improve mobility. Baseline:  IND/compliant per report (3/25) Goal status: MET  2.  Pt will decrease 5xSTS to </=14.72 seconds in order to demonstrate decreased risk for falls and improved functional bilateral LE strength and power. Baseline: 24.72 seconds w/ light BUE support; 16.47 sec w/ intermittent touch to initiate stand (3/25) Goal status: PARTIALLY MET  3.  Pt will demonstrate TUG of </=12.09 seconds in order to decrease risk of falls and improve functional mobility using LRAD. Baseline: 22.09 sec no AD SBA (2/3); 12.22 sec IND (3/25) Goal status: PARTIALLY MET  4.  Pt will demonstrate a gait speed of >/=2.92 feet/sec in order to decrease risk for falls. Baseline: 2.32 ft/sec (2/3); 2.74 ft/sec (3/3); 2.85  ft/sec (3/25) Goal status: PARTIALLY MET  5.  Pt will ambulate >/=800 feet at IND level over unlevel outdoor surfaces and grass to promote household and community  access. Baseline: 1000 ft over unlevel and grass IND (3/25) Goal status: MET  6.  Pt will improve ABC Scale to >/= 69.4% in order to demonstrate improved fall risk and engagement in daily activities. Baseline: 59.4% (2/3); 98.94% (3/25) Goal status: MET  GOALS (re-cert 1/61) : Goals reviewed with patient? Yes  SHORT TERM GOALS: Target date: 02/14/2024  Pt will be independent and compliant with aquatic HEP in order to maintain functional progress and improve mobility. Baseline:  To be established. Goal status: INITIAL  2.  Pt will demonstrate a gait speed of >/=2.92 feet/sec in order to decrease risk for falls. Baseline: 2.32 ft/sec (2/3); 2.74 ft/sec (3/3); 2.85 ft/sec (3/25) Goal status: INITIAL  LONG TERM GOALS: Target date: 03/06/2024  1.  Pt will decrease 5xSTS to </=14.72 seconds in order to demonstrate decreased risk for falls and improved functional bilateral LE strength and power. Baseline: 24.72 seconds w/ light BUE support; 16.47 sec w/ intermittent touch to initiate stand (3/25) Goal status: INITIAL  2.  Pt will demonstrate TUG of </=10 seconds in order to decrease risk of falls and improve functional mobility using LRAD. Baseline: 22.09 sec no AD SBA (2/3); 12.22 sec IND (3/25) Goal status: REVISED  3.  Pt will demonstrate a gait speed of >/=3.12 feet/sec in order to decrease risk for falls. Baseline: 2.32 ft/sec (2/3); 2.74 ft/sec (3/3); 2.85 ft/sec (3/25) Goal status:  REVISED  ASSESSMENT:  CLINICAL IMPRESSION: Focus on balance and stretching with very good toleration. Right QL stretch gained with resisted left side bending without pain.  He reports improved toleration to amb and standing. States he is able to get down to the ground and plant his garden without pain and with rising indep.  He is progressing very well with PT intervention towards meeting stated goals.  Will assign a general aquatic program prior to dc.    OBJECTIVE IMPAIRMENTS: decreased activity  tolerance, decreased balance, decreased coordination, decreased endurance, decreased knowledge of condition, decreased mobility, difficulty walking, decreased strength, increased edema, and increased muscle spasms.   ACTIVITY LIMITATIONS: carrying, lifting, bending, sitting, standing, squatting, stairs, transfers, bed mobility, and locomotion level  PARTICIPATION LIMITATIONS: shopping, community activity, and yard work  PERSONAL FACTORS: Age, Fitness, and 1-2 comorbidities: HTN, ongoing S1 radiculopathy  are also affecting patient's functional outcome.   REHAB POTENTIAL: Excellent  CLINICAL DECISION MAKING: Evolving/moderate complexity  EVALUATION COMPLEXITY: Moderate  PLAN:  PT FREQUENCY: 2x/week (1 land and 1 aquatic for initial 3 weeks of re-cert)  PT DURATION: 8 weeks + 6 weeks (0/96 re-cert)  PLANNED INTERVENTIONS: 04540- PT Re-evaluation, 97110-Therapeutic exercises, 97530- Therapeutic activity, 97112- Neuromuscular re-education, 97535- Self Care, 98119- Manual therapy, [phone]- Gait training, [phone]- Orthotic Fit/training, [phone]- Aquatic Therapy, Patient/Family education, Balance training, Stair training, Taping, Dry Needling, Joint mobilization, Spinal mobilization, Vestibular training, DME instructions, Moist heat, Therapeutic exercises, Therapeutic activity, Neuromuscular re-education, Gait training, and Self Care  PLAN FOR NEXT SESSION: Modify land HEP for core and LE strength, stretching, and balance.  SciFit for reciprocal mobility.  Reaction time - blaze pods, balloon taps on compliant surfaces, timed puzzles on compliant surface.   Upper body strength. Hip flexor strength.  Treadmill training.  Standing windmills vs modified w/ unilateral UE support.  R adductor strengthening - card slides across midline  Aquatics:  please establish simple aquatic HEP per  pt request  Adriana Hopping Laneta Pintos) Charday Capetillo MPT 02/12/24 3:50 PM [facility] [address] [place], [place], [address] Phone: [phone]   Fax:  [phone]

## 2024-02-13 ENCOUNTER — Encounter: Payer: Self-pay | Admitting: Neurology

## 2024-02-13 ENCOUNTER — Ambulatory Visit: Payer: Medicare Other | Admitting: Neurology

## 2024-02-13 VITALS — BP 154/77 | HR 43 | Ht 75.0 in | Wt 233.0 lb

## 2024-02-13 DIAGNOSIS — R2689 Other abnormalities of gait and mobility: Secondary | ICD-10-CM | POA: Diagnosis not present

## 2024-02-13 DIAGNOSIS — D892 Hypergammaglobulinemia, unspecified: Secondary | ICD-10-CM

## 2024-02-13 DIAGNOSIS — M62838 Other muscle spasm: Secondary | ICD-10-CM

## 2024-02-13 DIAGNOSIS — R760 Raised antibody titer: Secondary | ICD-10-CM | POA: Diagnosis not present

## 2024-02-13 DIAGNOSIS — R29898 Other symptoms and signs involving the musculoskeletal system: Secondary | ICD-10-CM

## 2024-02-13 DIAGNOSIS — G2582 Stiff-man syndrome: Secondary | ICD-10-CM | POA: Diagnosis not present

## 2024-02-13 DIAGNOSIS — G629 Polyneuropathy, unspecified: Secondary | ICD-10-CM

## 2024-02-13 DIAGNOSIS — E60 Dietary zinc deficiency: Secondary | ICD-10-CM

## 2024-02-13 DIAGNOSIS — D472 Monoclonal gammopathy: Secondary | ICD-10-CM

## 2024-02-13 DIAGNOSIS — M5417 Radiculopathy, lumbosacral region: Secondary | ICD-10-CM

## 2024-02-13 DIAGNOSIS — E559 Vitamin D deficiency, unspecified: Secondary | ICD-10-CM

## 2024-02-13 DIAGNOSIS — R269 Unspecified abnormalities of gait and mobility: Secondary | ICD-10-CM

## 2024-02-13 NOTE — Patient Instructions (Signed)
 -Continue Zinc supplementation 50 mg daily -Continue Vit D supplementation -Continue Valium 5 mg up to 4 times daily -Continue monthly IVIg -Follow up with hematology for monoclonal gammopathy as planned  Return to clinic in 6 months  Please let me know if you have any questions or concerns in the meantime.   The physicians and staff at [facility] are committed to providing excellent care. You may receive a survey requesting feedback about your experience at our office. We strive to receive "very good" responses to the survey questions. If you feel that your experience would prevent you from giving the office a "very good " response, please contact our office to try to remedy the situation. We may be reached at [phone]. Thank you for taking the time out of your busy day to complete the survey.  Jacquelyne Balint, MD [facility]  Preventing Falls at [facility] are common, often dreaded events in the lives of older people. Aside from the obvious injuries and even death that may result, fall can cause wide-ranging consequences including loss of independence, mental decline, decreased activity and mobility. Younger people are also at risk of falling, especially those with chronic illnesses and fatigue.  Ways to reduce risk for falling Examine diet and medications. Warm foods and alcohol dilate blood vessels, which can lead to dizziness when standing. Sleep aids, antidepressants and pain medications can also increase the likelihood of a fall.  Get a vision exam. Poor vision, cataracts and glaucoma increase the chances of falling.  Check foot gear. Shoes should fit snugly and have a sturdy, nonskid sole and a broad, low heel  Participate in a physician-approved exercise program to build and maintain muscle strength and improve balance and coordination. Programs that use ankle weights or stretch bands are excellent for muscle-strengthening. Water aerobics programs and low-impact Tai  Chi programs have also been shown to improve balance and coordination.  Increase vitamin D intake. Vitamin D improves muscle strength and increases the amount of calcium the body is able to absorb and deposit in bones.  How to prevent falls from common hazards Floors - Remove all loose wires, cords, and throw rugs. Minimize clutter. Make sure rugs are anchored and smooth. Keep furniture in its usual place.  Chairs -- Use chairs with straight backs, armrests and firm seats. Add firm cushions to existing pieces to add height.  Bathroom - Install grab bars and non-skid tape in the tub or shower. Use a bathtub transfer bench or a shower chair with a back support Use an elevated toilet seat and/or safety rails to assist standing from a low surface. Do not use towel racks or bathroom tissue holders to help you stand.  Lighting - Make sure halls, stairways, and entrances are well-lit. Install a night light in your bathroom or hallway. Make sure there is a light switch at the top and bottom of the staircase. Turn lights on if you get up in the middle of the night. Make sure lamps or light switches are within reach of the bed if you have to get up during the night.  Kitchen - Install non-skid rubber mats near the sink and stove. Clean spills immediately. Store frequently used utensils, pots, pans between waist and eye level. This helps prevent reaching and bending. Sit when getting things out of lower cupboards.  Living room/ Bedrooms - Place furniture with wide spaces in between, giving enough room to move around. Establish a route through the living room that gives you something  to hold onto as you walk.  Stairs - Make sure treads, rails, and rugs are secure. Install a rail on both sides of the stairs. If stairs are a threat, it might be helpful to arrange most of your activities on the lower level to reduce the number of times you must climb the stairs.  Entrances and doorways - Install metal handles on  the walls adjacent to the doorknobs of all doors to make it more secure as you travel through the doorway.  Tips for maintaining balance Keep at least one hand free at all times. Try using a backpack or fanny pack to hold things rather than carrying them in your hands. Never carry objects in both hands when walking as this interferes with keeping your balance.  Attempt to swing both arms from front to back while walking. This might require a conscious effort if Parkinson's disease has diminished your movement. It will, however, help you to maintain balance and posture, and reduce fatigue.  Consciously lift your feet off of the ground when walking. Shuffling and dragging of the feet is a common culprit in losing your balance.  When trying to navigate turns, use a "U" technique of facing forward and making a wide turn, rather than pivoting sharply.  Try to stand with your feet shoulder-length apart. When your feet are close together for any length of time, you increase your risk of losing your balance and falling.  Do one thing at a time. Don't try to walk and accomplish another task, such as reading or looking around. The decrease in your automatic reflexes complicates motor function, so the less distraction, the better.  Do not wear rubber or gripping soled shoes, they might "catch" on the floor and cause tripping.  Move slowly when changing positions. Use deliberate, concentrated movements and, if needed, use a grab bar or walking aid. Count 15 seconds between each movement. For example, when rising from a seated position, wait 15 seconds after standing to begin walking.  If balance is a continuous problem, you might want to consider a walking aid such as a cane, walking stick, or walker. Once you've mastered walking with help, you might be ready to try it on your own again.

## 2024-02-14 DIAGNOSIS — G2582 Stiff-man syndrome: Secondary | ICD-10-CM | POA: Diagnosis not present

## 2024-02-17 ENCOUNTER — Encounter: Payer: Self-pay | Admitting: Physical Therapy

## 2024-02-17 ENCOUNTER — Ambulatory Visit: Attending: Physician Assistant | Admitting: Physical Therapy

## 2024-02-17 DIAGNOSIS — R600 Localized edema: Secondary | ICD-10-CM

## 2024-02-17 DIAGNOSIS — M6281 Muscle weakness (generalized): Secondary | ICD-10-CM | POA: Diagnosis not present

## 2024-02-17 DIAGNOSIS — R2689 Other abnormalities of gait and mobility: Secondary | ICD-10-CM

## 2024-02-17 DIAGNOSIS — M25652 Stiffness of left hip, not elsewhere classified: Secondary | ICD-10-CM | POA: Diagnosis not present

## 2024-02-17 DIAGNOSIS — M25675 Stiffness of left foot, not elsewhere classified: Secondary | ICD-10-CM

## 2024-02-17 DIAGNOSIS — R2681 Unsteadiness on feet: Secondary | ICD-10-CM

## 2024-02-17 DIAGNOSIS — R252 Cramp and spasm: Secondary | ICD-10-CM | POA: Diagnosis not present

## 2024-02-17 DIAGNOSIS — M25662 Stiffness of left knee, not elsewhere classified: Secondary | ICD-10-CM

## 2024-02-17 DIAGNOSIS — M25672 Stiffness of left ankle, not elsewhere classified: Secondary | ICD-10-CM

## 2024-02-17 DIAGNOSIS — Z9181 History of falling: Secondary | ICD-10-CM

## 2024-02-17 NOTE — Therapy (Signed)
 OUTPATIENT PHYSICAL THERAPY NEURO TREATMENT    Patient Name: Michael [name] MRN: [id number] DOB:Jan 16, 1956, 68 y.o., male Today's Date: 02/17/2024   PCP: Arcadio Knuckles, MD REFERRING PROVIDER: Persons, Norma Beckers, [place]   END OF SESSION:  PT End of Session - 02/17/24 1413     Visit Number 17    Number of Visits 29   17 + 12   Date for PT Re-Evaluation 03/20/24   pushed out due to aquatic needs   Authorization Type [employer] MEDICARE    Progress Note Due on Visit 10    PT Start Time 1406    PT Stop Time 1447    PT Time Calculation (min) 41 min    Equipment Utilized During Treatment Gait belt    Activity Tolerance Patient tolerated treatment well    Behavior During Therapy WFL for tasks assessed/performed             Past Medical History:  Diagnosis Date   Hyperlipidemia    Hypertension    Lumbar disc disease    Prostate cancer (HCC)    Past Surgical History:  Procedure Laterality Date   LUMBAR DISC SURGERY  2014   LUMBAR DISC SURGERY     right ankle fracture  1978   external pinning   Patient Active Problem List   Diagnosis Date Noted   Anemia due to zinc  deficiency 01/28/2024   Deficiency anemia 01/22/2024   Bradycardia, sinus 01/22/2024   Screening for colon cancer 01/22/2024   Need for prophylactic vaccination with combined diphtheria-tetanus-pertussis (DTP) vaccine 01/22/2024   Frequent unifocal PVCs 01/22/2024   MGUS (monoclonal gammopathy of unknown significance) 06/10/2023   RBC microcytosis 06/10/2023   DDD (degenerative disc disease), lumbar 12/21/2022   Prostate cancer (HCC); T1c; GG 1; initial diagnosis 2011 12/05/2022   Chronic hyperglycemia 11/16/2022   Dyslipidemia, goal LDL below 70 09/23/2022   Elevated PSA, between 10 and less than 20 ng/ml 09/17/2022   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   Vitamin D  deficiency 09/07/2021    ONSET DATE: symptoms began in 2020; pt had a fall August 23, 2023  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  He also has had "the itch" to climb a knotted rope as he feels this would stretch him out "real good".  Pt accompanied by: self (he continues to drive himself)  PERTINENT HISTORY: degenerative disc disease (followed by [facility]) s/p L5-S1 surgery, HLD, HTN, prostate cancer  Patient's symptoms started in 2020 - right flank pain (reported initially as abdominal pain, but pt clarifies this is not a deep cramp/stomach pain, but a muscular soreness especially with movement), core weakness (not able to sit up well), sneezing and coughing was painful as well.  A year later he noted stiffness in his legs after sitting where he has to walk up his legs with his hands.  Now, he reports numbness and soreness in left hamstring area as well as swelling and muscle spasms (left worse).  In order to walk he has to weight shift and bring feet into narrowed BOS then start with small steps.  He loosens up with movement. He reports freezing when startled. Ambulates w/ SPC.  Upper body remains WNL.  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  Pt has a fall August 23, 2023  halting his prior PT episode due to broken ribs.  PAIN:  Are you having pain? No  PRECAUTIONS: Fall  RED FLAGS: None-pt does report more urinary urgency that he reports corresponds with the reported itch in his legs and low back, but he has discussed this with his physician (Dr. Genita Keys)  WEIGHT BEARING RESTRICTIONS: No  FALLS: Has patient fallen in last 6 months? Yes. 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  He modifies some homemaking tasks, but reports he can stand longer to do things like cooking.  PATIENT GOALS: Work on upper body strength  OBJECTIVE:   DIAGNOSTIC FINDINGS:  EMG on 05/14/23 showed an active left S1 radiculopathy (similar to prior EMG)  X-ray Bone Survey Met (11/06/2023):   IMPRESSION: No evidence of lytic or destructive bone lesion  X-ray Ribs Unilateral Right (09/04/2023): Radiographs of his right ribs demonstrate no more blunting of the  costophrenic angle he does have vascular markings going out to the  periphery.   COGNITION: Overall cognitive status: Within functional limits for tasks assessed and pt hyperverbal   SENSATION: Light touch: WFL  COORDINATION: LE RAMS:  slow and deliberate Bilateral Heel-to-shin:  limited by lack of bilateral ER  EDEMA:  bilateral ankle edema (baseline per pt - he has been using castor oil and lavender to improve skin integrity - MD aware of regimen)  MUSCLE TONE: None noted in BLE  POSTURE: weight shift right  LOWER EXTREMITY ROM:      Active  Right Eval Left Eval  Hip flexion Grossly WFL - limited bilateral ER (basically at neutral  Hip extension   Hip abduction   Hip adduction   Hip internal rotation   Hip external rotation  Knee flexion   Knee extension   Ankle dorsiflexion   Ankle plantarflexion    Ankle inversion    Ankle eversion     (Blank rows = not tested)  LOWER EXTREMITY MMT:    MMT Right Eval Left Eval  Hip flexion 3/5 3+/5  Hip extension    Hip abduction    Hip adduction    Hip internal rotation    Hip external rotation    Knee flexion    Knee extension 4+/5 4+/5  Ankle dorsiflexion 3+/5 4/5  Ankle plantarflexion    Ankle inversion    Ankle eversion    (Blank rows = not tested)  BED MOBILITY:  Pt reports his bed mobility has greatly improved and he has switched his setup so he enters and exits bed on side closest to his bathroom  TRANSFERS: Assistive device utilized: None  Sit to stand: Modified independence - uses hands Stand to sit: Modified independence Chair to chair: Complete Independence Floor:  pt performed modI on most recent fall  GAIT: Gait pattern: step through pattern, decreased step length- Right, decreased step length- Left, decreased stride length, decreased hip/knee flexion- Right, decreased hip/knee flexion- Left, lateral lean- Right, decreased trunk rotation, and narrow BOS Distance walked: various clinic distances Assistive device utilized: None Level of assistance: SBA Comments: No notable ataxia or involuntary movements during ambulation into/out of clinic.  FUNCTIONAL TESTS:  5 times sit to stand: 24.72 seconds w/ light BUE support - wide stance with some crouching into tall stand due to imbalance Timed up and go (TUG): To be assessed. 10 meter walk test: To be assessed.  PATIENT SURVEYS:  ABC scale To be assessed.  TODAY'S TREATMENT:    02/17/24                                                                                                                           -SciFit in multi-peaks mode x8 minutes up to level 7.0 for global strengthening and dynamic cardiovascular warmup.  Pt continues to have positive response to this intervention reporting feeling much looser post-bike. -10lb slam ball roll w/ RLE x30 ft into adduction then abduction -Standing windmills x10 each side SBA -Alternating adductor card slide with coordinated color to LE x30 ft SBA-CGA, more difficulty w/ LLE due to fatigue from SLS task prior  PATIENT EDUCATION: Education details: Continue HEP w/ additions and chair yoga.  Continue walking program.   Person educated: Patient Education method: Explanation Education comprehension: verbalized understanding and needs further education  HOME EXERCISE PROGRAM: Review from episode prior - modify as needed:  Access Code: [email or web address]  You Can Walk For A Certain Length Of Time Each Day (can start with 3-4 days and work up to everyday - use cane for safety and walk in well lit familiar areas with smooth surfaces)  Walk 5 minutes 2 times per day.             Increase 2  minutes every 7 days              Work up to 20 minutes (1x per day).               Example:                         Day 1-2           4-5 minutes     3 times per day                         Day 7-8           10-12 minutes 2-3 times per day                         Day 13-14       20-22 minutes 1-2 times per day  Chair yoga poses 1-6, 8-9 (modified position 9 and held x10 seconds), 12  GOALS: Goals reviewed with patient? Yes  SHORT TERM GOALS: Target date: 12/27/2023  Pt will decrease 5xSTS to </=19.72 seconds with improved immediate standing balance in order to demonstrate decreased risk for falls and improved functional bilateral LE strength and power. Baseline:  24.72 seconds w/ light BUE support; 15.97 sec w/ BUE support (3/3) Goal status: MET  2.  Pt will demonstrate TUG of </=17.09 seconds in order to decrease risk of falls  and improve functional mobility using LRAD. Baseline: 22.09 sec no AD SBA (2/3); 12.86 sec no AD SBA (3/3) Goal status: MET  3.  Pt will demonstrate a gait speed of >/=2.52 feet/sec in order to decrease risk for falls. Baseline: 2.32 ft/sec (2/3); 2.74 ft/sec (3/3) Goal status: MET  LONG TERM GOALS: Target date: 01/24/2024  Pt will be independent and compliant with strength, stretching, and balance focused land-based HEP and aquatic HEP if continuing following discharge in order to maintain functional progress and improve mobility. Baseline:  IND/compliant per report (3/25) Goal status: MET  2.  Pt will decrease 5xSTS to </=14.72 seconds in order to demonstrate decreased risk for falls and improved functional bilateral LE strength and power. Baseline: 24.72 seconds w/ light BUE support; 16.47 sec w/ intermittent touch to initiate stand (3/25) Goal status: PARTIALLY MET  3.  Pt will demonstrate TUG of </=12.09 seconds in order to decrease risk of falls and improve functional mobility using LRAD. Baseline: 22.09 sec no AD SBA (2/3); 12.22 sec IND (3/25) Goal status: PARTIALLY MET  4.  Pt will demonstrate a gait speed of >/=2.92 feet/sec in order to decrease risk for falls. Baseline: 2.32 ft/sec (2/3); 2.74 ft/sec (3/3); 2.85 ft/sec (3/25) Goal status: PARTIALLY MET  5.  Pt will ambulate >/=800 feet at IND level over unlevel outdoor surfaces and grass to promote household and community access. Baseline: 1000 ft over unlevel and grass IND (3/25) Goal status: MET  6.  Pt will improve ABC Scale to >/= 69.4% in order to demonstrate improved fall risk and engagement in daily activities. Baseline: 59.4% (2/3); 98.94% (3/25) Goal status: MET  GOALS (re-cert 5/18) : Goals reviewed with patient? Yes  SHORT TERM GOALS: Target date: 02/14/2024  Pt will be independent and compliant with aquatic HEP in order to maintain functional progress and improve mobility. Baseline:  To  be  established. Goal status: INITIAL  2.  Pt will demonstrate a gait speed of >/=2.92 feet/sec in order to decrease risk for falls. Baseline: 2.32 ft/sec (2/3); 2.74 ft/sec (3/3); 2.85 ft/sec (3/25) Goal status: INITIAL  LONG TERM GOALS: Target date: 03/06/2024  1.  Pt will decrease 5xSTS to </=14.72 seconds in order to demonstrate decreased risk for falls and improved functional bilateral LE strength and power. Baseline: 24.72 seconds w/ light BUE support; 16.47 sec w/ intermittent touch to initiate stand (3/25) Goal status: INITIAL  2.  Pt will demonstrate TUG of </=10 seconds in order to decrease risk of falls and improve functional mobility using LRAD. Baseline: 22.09 sec no AD SBA (2/3); 12.22 sec IND (3/25) Goal status: REVISED  3.  Pt will demonstrate a gait speed of >/=3.12 feet/sec in order to decrease risk for falls. Baseline: 2.32 ft/sec (2/3); 2.74 ft/sec (3/3); 2.85 ft/sec (3/25) Goal status:  REVISED  ASSESSMENT:  CLINICAL IMPRESSION: Focus of skilled session on addressing ongoing stiffness and LE strengthening particularly of the adductors.  He has some increased fatigue with prolonged SLS and some difficulty managing out of crossover stepping during adductor ball task.  This PT missed STG assessment this visit so will plan to assess at next scheduled appt.  He continues to benefit from skilled PT as he appears to be making progress in optimizing his upright mobility and maintained high level of independence.  Will continue per POC.    OBJECTIVE IMPAIRMENTS: decreased activity tolerance, decreased balance, decreased coordination, decreased endurance, decreased knowledge of condition, decreased mobility, difficulty walking, decreased strength, increased edema, and increased muscle spasms.   ACTIVITY LIMITATIONS: carrying, lifting, bending, sitting, standing, squatting, stairs, transfers, bed mobility, and locomotion level  PARTICIPATION LIMITATIONS: shopping, community  activity, and yard work  PERSONAL FACTORS: Age, Fitness, and 1-2 comorbidities: HTN, ongoing S1 radiculopathy  are also affecting patient's functional outcome.   REHAB POTENTIAL: Excellent  CLINICAL DECISION MAKING: Evolving/moderate complexity  EVALUATION COMPLEXITY: Moderate  PLAN:  PT FREQUENCY: 2x/week (1 land and 1 aquatic for initial 3 weeks of re-cert)  PT DURATION: 8 weeks + 6 weeks (4/09 re-cert)  PLANNED INTERVENTIONS: 81191- PT Re-evaluation, 97110-Therapeutic exercises, 97530- Therapeutic activity, 97112- Neuromuscular re-education, 97535- Self Care, 47829- Manual therapy, [phone]- Gait training, [phone]- Orthotic Fit/training, [phone]- Aquatic Therapy, Patient/Family education, Balance training, Stair training, Taping, Dry Needling, Joint mobilization, Spinal mobilization, Vestibular training, DME instructions, Moist heat, Therapeutic exercises, Therapeutic activity, Neuromuscular re-education, Gait training, and Self Care  PLAN FOR NEXT SESSION: Modify land HEP for core and LE strength, stretching, and balance.  SciFit for reciprocal mobility.  Reaction time - blaze pods, balloon taps on compliant surfaces, timed puzzles on compliant surface.   Upper body strength. Hip flexor strength.  Treadmill training.  ASSESS STGs!  Aquatics:  please establish simple aquatic HEP per pt request  Marilou Showman, PT, DPT

## 2024-02-18 ENCOUNTER — Other Ambulatory Visit (INDEPENDENT_AMBULATORY_CARE_PROVIDER_SITE_OTHER)

## 2024-02-18 DIAGNOSIS — E785 Hyperlipidemia, unspecified: Secondary | ICD-10-CM

## 2024-02-18 DIAGNOSIS — I10 Essential (primary) hypertension: Secondary | ICD-10-CM

## 2024-02-18 NOTE — Progress Notes (Signed)
 02/18/2024 Name: Michael [name] MRN: [id number] DOB: 03-Apr-1956  Chief Complaint  Patient presents with   Hypertension   Hyperlipidemia   Medication Management    Michael [name] is a 68 y.o. year old male who presented for a telephone visit.   They were referred to the pharmacist by their PCP for assistance in managing hypertension and hyperlipidemia.    Subjective:  Care Team: Primary Care Provider: Arcadio Knuckles, MD ; Next Scheduled Visit: none scheduled  Medication Access/Adherence  Current Pharmacy:  [id number] - [place], [place] - [address] AT [facility] [address] [place]ette [place] [address] Phone: [phone] Fax: [phone]   Patient reports affordability concerns with their medications: No  Patient reports access/transportation concerns to their pharmacy: No  Patient reports adherence concerns with their medications:  No    Hypertension:  Current medications: none currently - pt did not start triamterene /HCTZ   Patient has a validated, automated, upper arm home BP cuff Current blood pressure readings readings: BP 130-140s/70s at home   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   Hyperlipidemia/ASCVD Risk Reduction  Current lipid lowering medications: none currently - did not start rosuvastatin    The 10-year ASCVD risk score (Arnett DK, et al., 2019) is: 19.7%   Values used to calculate the score:     Age: 85 years     Sex: Male     Is Non-Hispanic African American: No     Diabetic: No     Tobacco smoker: No     Systolic Blood Pressure: 154 mmHg     Is BP treated: Yes     HDL Cholesterol: 45.5 mg/dL     Total Cholesterol: 162 mg/dL    Objective:  Lab Results  Component Value Date   HGBA1C 4.4 (L) 01/22/2024    Lab Results  Component Value Date   CREATININE 0.79 11/06/2023   BUN 12 11/06/2023   NA 139 11/06/2023   K 3.6 11/06/2023   CL 106  11/06/2023   CO2 28 11/06/2023    Lab Results  Component Value Date   CHOL 162 01/22/2024   HDL 45.50 01/22/2024   LDLCALC 105 (H) 01/22/2024   TRIG 57.0 01/22/2024   CHOLHDL 4 01/22/2024    Medications Reviewed Today     Reviewed by Dion Frankel, RPH (Pharmacist) on 02/18/24 at 1434  Med List Status: <None>   Medication Order Taking? Sig Documenting Provider Last Dose Status Informant  Cholecalciferol  50 MCG (2000 UT) TABS [id number] Yes Take 1 tablet (2,000 Units total) by mouth daily. Arcadio Knuckles, MD Taking Active   diazepam  (VALIUM ) 5 MG tablet [id number] Yes Take 1 tablet (5 mg total) by mouth every 6 (six) hours as needed for anxiety. Ellene Gustin, MD Taking Active   GAMUNEX-C 5 GM/50ML SOLN [id number]   [provider]  Active   GARLIC PO [id number]  Take by mouth daily. [provider]  Active   rosuvastatin  (CRESTOR ) 10 MG tablet [id number] No Take 1 tablet (10 mg total) by mouth daily.  Patient not taking: Reported on 02/18/2024   Arcadio Knuckles, MD Not Taking Active   triamterene -hydrochlorothiazide (DYAZIDE) 37.5-25 MG capsule [id number] No Take 1 each (1 capsule total) by mouth daily.  Patient not taking: Reported on 02/18/2024   Arcadio Knuckles, MD Not Taking Active   zinc  gluconate 50 MG tablet [id number] Yes Take 1 tablet (50  mg total) by mouth daily. Arcadio Knuckles, MD Taking Active               Assessment/Plan:   Hypertension: - Currently uncontrolled, BP goal <130/80 - Reviewed long term cardiovascular and renal outcomes of uncontrolled blood pressure - Reviewed appropriate blood pressure monitoring technique and reviewed goal blood pressure. Recommended to check home blood pressure and heart rate regularly - Recommend to start triamterene /hydrochlorothiazide     Hyperlipidemia/ASCVD Risk Reduction: - Currently uncontrolled. LDL goal <70 per cardio and PCP - Reviewed long term complications of uncontrolled cholesterol -  Reviewed lifestyle recommendations including increased movement/exercise as able - Recommend to start rosuvastatin     Follow Up Plan: PRN  [name], PharmD, BCPS, CPP Clinical Pharmacist Practitioner [facility] [phone]

## 2024-02-18 NOTE — Patient Instructions (Signed)
 It was a pleasure speaking with you today!  Start taking triamterene /hydrochlorothiazide and rosuvastatin  as prescribed by Dr. Rochelle Chu.   Make follow up appointment with Dr. Rochelle Chu for around 04/23/24.  Feel free to call with any questions or concerns!  [name], PharmD, BCPS, CPP Clinical Pharmacist Practitioner  Primary [facility] at [facility] [phone]

## 2024-02-19 ENCOUNTER — Ambulatory Visit (HOSPITAL_BASED_OUTPATIENT_CLINIC_OR_DEPARTMENT_OTHER): Admitting: Physical Therapy

## 2024-02-19 ENCOUNTER — Encounter (HOSPITAL_BASED_OUTPATIENT_CLINIC_OR_DEPARTMENT_OTHER): Payer: Self-pay | Admitting: Physical Therapy

## 2024-02-19 DIAGNOSIS — R2689 Other abnormalities of gait and mobility: Secondary | ICD-10-CM | POA: Diagnosis not present

## 2024-02-19 DIAGNOSIS — R2681 Unsteadiness on feet: Secondary | ICD-10-CM | POA: Diagnosis not present

## 2024-02-19 DIAGNOSIS — M25662 Stiffness of left knee, not elsewhere classified: Secondary | ICD-10-CM | POA: Diagnosis not present

## 2024-02-19 DIAGNOSIS — M25652 Stiffness of left hip, not elsewhere classified: Secondary | ICD-10-CM

## 2024-02-19 NOTE — Therapy (Signed)
 OUTPATIENT PHYSICAL THERAPY NEURO TREATMENT    Patient Name: Gracin Mcpartland MRN: [id number] DOB:Aug 10, 1956, 68 y.o., male Today's Date: 02/19/2024   PCP: Arcadio Knuckles, MD REFERRING PROVIDER: Persons, Norma Beckers, [place]   END OF SESSION:  PT End of Session - 02/19/24 1452     Visit Number 18    Number of Visits 29   17 + 12   Date for PT Re-Evaluation 03/20/24   pushed out due to aquatic needs   Authorization Type [employer] MEDICARE    Progress Note Due on Visit 10    PT Start Time 1447    PT Stop Time 1525    PT Time Calculation (min) 38 min    Equipment Utilized During Treatment Gait belt    Activity Tolerance Patient tolerated treatment well    Behavior During Therapy WFL for tasks assessed/performed             Past Medical History:  Diagnosis Date   Hyperlipidemia    Hypertension    Lumbar disc disease    Prostate cancer (HCC)    Past Surgical History:  Procedure Laterality Date   LUMBAR DISC SURGERY  2014   LUMBAR DISC SURGERY     right ankle fracture  1978   external pinning   Patient Active Problem List   Diagnosis Date Noted   Anemia due to zinc  deficiency 01/28/2024   Deficiency anemia 01/22/2024   Bradycardia, sinus 01/22/2024   Screening for colon cancer 01/22/2024   Need for prophylactic vaccination with combined diphtheria-tetanus-pertussis (DTP) vaccine 01/22/2024   Frequent unifocal PVCs 01/22/2024   MGUS (monoclonal gammopathy of unknown significance) 06/10/2023   RBC microcytosis 06/10/2023   DDD (degenerative disc disease), lumbar 12/21/2022   Prostate cancer (HCC); T1c; GG 1; initial diagnosis 2011 12/05/2022   Chronic hyperglycemia 11/16/2022   Dyslipidemia, goal LDL below 70 09/23/2022   Elevated PSA, between 10 and less than 20 ng/ml 09/17/2022   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   Vitamin D  deficiency 09/07/2021    ONSET DATE: symptoms began in 2020; pt had a fall August 23, 2023  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  Pt accompanied by: self (he continues to drive himself)  PERTINENT HISTORY: degenerative disc disease (followed by [facility]) s/p L5-S1 surgery, HLD, HTN, prostate cancer  Patient's symptoms started in 2020 - right flank pain (reported initially as abdominal pain, but pt clarifies this is not a deep cramp/stomach pain, but a muscular soreness especially with movement), core weakness (not able to sit up well), sneezing and coughing was painful as well.  A year later he noted stiffness in his legs after sitting where he has to walk up his legs with his hands.  Now, he reports numbness and soreness in left hamstring area as well as swelling and muscle spasms (left worse).  In order to walk he has to weight shift and bring feet into narrowed BOS then start with small steps.  He loosens up with movement. He reports freezing when startled. Ambulates w/ SPC.  Upper body remains WNL.  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  Pt has a fall August 23, 2023 halting his prior PT episode due to broken ribs.  PAIN:  Are you having pain? No  PRECAUTIONS: Fall  RED FLAGS: None-pt does report more urinary urgency that he reports corresponds with the reported itch in his legs and low back, but he has discussed this with his physician (Dr. Genita Keys)  WEIGHT BEARING RESTRICTIONS: No  FALLS: Has patient fallen in last 6  months? Yes. 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  He modifies some homemaking tasks, but reports he can stand longer to do things like cooking.  PATIENT GOALS: Work on upper body strength  OBJECTIVE:   DIAGNOSTIC FINDINGS:  EMG on 05/14/23 showed an active left S1 radiculopathy (similar to prior EMG)  X-ray Bone Survey Met (11/06/2023):   IMPRESSION: No evidence of lytic or destructive bone lesion  X-ray Ribs Unilateral Right (09/04/2023): Radiographs of his right ribs demonstrate no more blunting of the  costophrenic angle he does have vascular markings going out to the  periphery.   COGNITION: Overall cognitive status: Within functional limits for tasks assessed and pt hyperverbal   SENSATION: Light touch: WFL  COORDINATION: LE RAMS:  slow and deliberate Bilateral Heel-to-shin:  limited by lack of bilateral ER  EDEMA:  bilateral ankle edema (baseline per pt - he has been using castor oil and lavender to improve skin integrity - MD aware of regimen)  MUSCLE TONE: None noted in BLE  POSTURE: weight shift right  LOWER EXTREMITY ROM:     Active  Right Eval Left Eval  Hip flexion Grossly WFL - limited bilateral ER (basically at neutral  Hip extension   Hip abduction   Hip adduction   Hip internal rotation   Hip external rotation   Knee flexion   Knee extension   Ankle dorsiflexion   Ankle plantarflexion    Ankle inversion    Ankle eversion     (Blank rows = not tested)  LOWER EXTREMITY  MMT:  MMT Right Eval Left Eval  Hip flexion 3/5 3+/5  Hip extension    Hip abduction    Hip adduction    Hip internal rotation    Hip external rotation    Knee flexion    Knee extension 4+/5 4+/5  Ankle dorsiflexion 3+/5 4/5  Ankle plantarflexion    Ankle inversion    Ankle eversion    (Blank rows = not tested)  BED MOBILITY:  Pt reports his bed mobility has greatly improved and he has switched his setup so he enters and exits bed on side closest to his bathroom  TRANSFERS: Assistive device utilized: None  Sit to stand: Modified independence - uses hands Stand to sit: Modified independence Chair to chair: Complete Independence Floor:  pt performed modI on most recent fall  GAIT: Gait pattern: step through pattern, decreased step length- Right, decreased step length- Left, decreased stride length, decreased hip/knee flexion- Right, decreased hip/knee flexion- Left, lateral lean- Right, decreased trunk rotation, and narrow BOS Distance walked: various clinic distances Assistive device utilized: None Level of assistance: SBA Comments: No notable ataxia or involuntary movements during ambulation into/out of clinic.  FUNCTIONAL TESTS:  5 times sit to stand: 24.72 seconds w/ light BUE support - wide stance with some crouching into tall stand due to imbalance Timed up and go (TUG): To be assessed. 10 meter walk test: To be assessed.  PATIENT SURVEYS:  ABC scale To be assessed.  TODAY'S TREATMENT:    02/19/24                                                                                                                          Pt seen for aquatic therapy today.  Treatment took place in water 3.5-4.75 ft in depth at the [facility] pool. Temp of water was 91.  Pt entered/exited the pool via stairs with hand rail.   *walking forward, back and side stepping unsupported *side lunges ue add/abd yellow HB multiple widths *grapevine *High knee bends stepping over  weighted noodle *hip hinging x10 with verbal and TC for execution *3 way le stretch *warrior 3 using yellow HB in 4.3 ft (good challenge) x 5 improved execution/core control and  balance *SLS 4.0 ue add/abd yellow HB (difficult-good challenge); progressed from 4.8 x 5-7 reps *hip flex stretch using noodle   Pt requires the buoyancy and hydrostatic pressure of water for support, and to offload joints by unweighting joint load by at least 50 % in navel deep water and by at least 75-80% in chest to neck deep water.  Viscosity of the water is needed for resistance of strengthening. Water current perturbations provides challenge to standing balance requiring increased core activation.  PATIENT EDUCATION: Education details: Continue HEP w/ additions and chair yoga.  Continue walking program.   Person educated: Patient Education method: Explanation Education comprehension: verbalized understanding and needs further education  HOME EXERCISE PROGRAM: Review from episode prior - modify as needed:  Access Code: [email or web address]  You Can Walk For A Certain Length Of Time Each Day (can start with 3-4 days and work up to everyday - use cane for safety and walk in well lit familiar areas with smooth surfaces)                          Walk 5 minutes 2 times per day.             Increase 2  minutes every 7 days              Work up to 20 minutes (1x per day).               Example:                         Day 1-2           4-5 minutes     3 times per day                         Day 7-8           10-12 minutes 2-3 times per day                         Day 13-14       20-22 minutes 1-2 times per day  Chair yoga poses 1-6, 8-9 (modified position 9 and held x10 seconds), 12  GOALS: Goals reviewed with patient? Yes  SHORT TERM GOALS: Target date: 12/27/2023  Pt will decrease 5xSTS to </=19.72 seconds with improved immediate standing balance in order to demonstrate decreased risk for falls and improved  functional bilateral LE strength and power. Baseline:  24.72 seconds w/ light BUE support; 15.97 sec w/ BUE support (3/3) Goal status: MET  2.  Pt will demonstrate TUG of </=17.09 seconds in order to decrease risk of falls and improve functional mobility using LRAD. Baseline: 22.09 sec no AD SBA (2/3); 12.86 sec no AD SBA (3/3) Goal status: MET  3.  Pt will demonstrate a gait speed of >/=2.52 feet/sec in order to decrease risk for falls. Baseline: 2.32 ft/sec (2/3); 2.74 ft/sec (3/3) Goal status: MET  LONG TERM GOALS: Target date: 01/24/2024  Pt will be independent and compliant with strength, stretching, and balance focused land-based HEP and aquatic HEP if continuing following discharge in order to maintain functional progress and improve mobility. Baseline:  IND/compliant per report (3/25) Goal status: MET  2.  Pt will decrease 5xSTS to </=14.72 seconds in order to demonstrate decreased risk for falls and improved functional bilateral LE strength and power. Baseline: 24.72 seconds w/ light BUE support; 16.47 sec w/ intermittent touch to initiate stand (3/25) Goal status: PARTIALLY MET  3.  Pt will demonstrate TUG of </=12.09 seconds in order to decrease risk of falls and improve functional mobility using LRAD. Baseline: 22.09 sec no AD SBA (2/3); 12.22 sec IND (3/25) Goal status: PARTIALLY MET  4.  Pt will demonstrate a gait speed of >/=2.92 feet/sec in order to decrease risk for falls. Baseline: 2.32 ft/sec (2/3); 2.74 ft/sec (3/3); 2.85 ft/sec (3/25) Goal status: PARTIALLY MET  5.  Pt will ambulate >/=800 feet at IND level over unlevel outdoor surfaces and grass to promote household and community access. Baseline: 1000 ft over unlevel and grass IND (3/25) Goal status: MET  6.  Pt will improve ABC Scale to >/=  69.4% in order to demonstrate improved fall risk and engagement in daily activities. Baseline: 59.4% (2/3); 98.94% (3/25) Goal status: MET  GOALS (re-cert 1/61)  : Goals reviewed with patient? Yes  SHORT TERM GOALS: Target date: 02/14/2024  Pt will be independent and compliant with aquatic HEP in order to maintain functional progress and improve mobility. Baseline:  To be established. Goal status: INITIAL  2.  Pt will demonstrate a gait speed of >/=2.92 feet/sec in order to decrease risk for falls. Baseline: 2.32 ft/sec (2/3); 2.74 ft/sec (3/3); 2.85 ft/sec (3/25) Goal status: INITIAL  LONG TERM GOALS: Target date: 03/06/2024  1.  Pt will decrease 5xSTS to </=14.72 seconds in order to demonstrate decreased risk for falls and improved functional bilateral LE strength and power. Baseline: 24.72 seconds w/ light BUE support; 16.47 sec w/ intermittent touch to initiate stand (3/25) Goal status: INITIAL  2.  Pt will demonstrate TUG of </=10 seconds in order to decrease risk of falls and improve functional mobility using LRAD. Baseline: 22.09 sec no AD SBA (2/3); 12.22 sec IND (3/25) Goal status: REVISED  3.  Pt will demonstrate a gait speed of >/=3.12 feet/sec in order to decrease risk for falls. Baseline: 2.32 ft/sec (2/3); 2.74 ft/sec (3/3); 2.85 ft/sec (3/25) Goal status:  REVISED  ASSESSMENT:  CLINICAL IMPRESSION: Initiated session working on LB and le stretching for pt to have decreased difficulty lifting le to don pants. He demonstrates improvements in standing balance completing warrior one and SLS with arm movements in decreased submersion.  Focus of skilled session on addressing ongoing stiffness and LE strengthening particularly of the adductors.  He has some increased fatigue with prolonged SLS and some difficulty managing out of crossover stepping during adductor ball task.  This PT missed STG assessment this visit so will plan to assess at next scheduled appt.  He continues to benefit from skilled PT as he appears to be making progress in optimizing his upright mobility and maintained high level of independence.  Will continue per  POC.    OBJECTIVE IMPAIRMENTS: decreased activity tolerance, decreased balance, decreased coordination, decreased endurance, decreased knowledge of condition, decreased mobility, difficulty walking, decreased strength, increased edema, and increased muscle spasms.   ACTIVITY LIMITATIONS: carrying, lifting, bending, sitting, standing, squatting, stairs, transfers, bed mobility, and locomotion level  PARTICIPATION LIMITATIONS: shopping, community activity, and yard work  PERSONAL FACTORS: Age, Fitness, and 1-2 comorbidities: HTN, ongoing S1 radiculopathy  are also affecting patient's functional outcome.   REHAB POTENTIAL: Excellent  CLINICAL DECISION MAKING: Evolving/moderate complexity  EVALUATION COMPLEXITY: Moderate  PLAN:  PT FREQUENCY: 2x/week (1 land and 1 aquatic for initial 3 weeks of re-cert)  PT DURATION: 8 weeks + 6 weeks (0/96 re-cert)  PLANNED INTERVENTIONS: 04540- PT Re-evaluation, 97110-Therapeutic exercises, 97530- Therapeutic activity, 97112- Neuromuscular re-education, 97535- Self Care, 98119- Manual therapy, [phone]- Gait training, [phone]- Orthotic Fit/training, [phone]- Aquatic Therapy, Patient/Family education, Balance training, Stair training, Taping, Dry Needling, Joint mobilization, Spinal mobilization, Vestibular training, DME instructions, Moist heat, Therapeutic exercises, Therapeutic activity, Neuromuscular re-education, Gait training, and Self Care  PLAN FOR NEXT SESSION: Modify land HEP for core and LE strength, stretching, and balance.  SciFit for reciprocal mobility.  Reaction time - blaze pods, balloon taps on compliant surfaces, timed puzzles on compliant surface.   Upper body strength. Hip flexor strength.  Treadmill training.  ASSESS STGs!  Aquatics:  please establish simple aquatic HEP per pt request  Adriana Hopping Laneta Pintos) Kamauri Kathol MPT 02/19/24 3:31 PM [facility] [phone]  [place], [place],  [address] Phone: [phone]   Fax:  [phone]

## 2024-02-24 ENCOUNTER — Telehealth: Payer: Self-pay | Admitting: Physical Therapy

## 2024-02-24 ENCOUNTER — Encounter (HOSPITAL_BASED_OUTPATIENT_CLINIC_OR_DEPARTMENT_OTHER): Payer: Self-pay | Admitting: Physical Therapy

## 2024-02-24 ENCOUNTER — Ambulatory Visit: Admitting: Physical Therapy

## 2024-02-24 NOTE — Telephone Encounter (Signed)
 PT attempted to contact pt to notify of missed appt today.  Unidentified VM so no message left.  Marilou Showman, PT, DPT

## 2024-02-26 ENCOUNTER — Ambulatory Visit (HOSPITAL_BASED_OUTPATIENT_CLINIC_OR_DEPARTMENT_OTHER): Admitting: Physical Therapy

## 2024-02-28 ENCOUNTER — Ambulatory Visit: Attending: Physician Assistant | Admitting: Physical Therapy

## 2024-02-28 ENCOUNTER — Encounter: Payer: Self-pay | Admitting: Physical Therapy

## 2024-02-28 DIAGNOSIS — Z9181 History of falling: Secondary | ICD-10-CM

## 2024-02-28 DIAGNOSIS — M25662 Stiffness of left knee, not elsewhere classified: Secondary | ICD-10-CM

## 2024-02-28 DIAGNOSIS — M25672 Stiffness of left ankle, not elsewhere classified: Secondary | ICD-10-CM

## 2024-02-28 DIAGNOSIS — M6281 Muscle weakness (generalized): Secondary | ICD-10-CM | POA: Diagnosis not present

## 2024-02-28 DIAGNOSIS — M25652 Stiffness of left hip, not elsewhere classified: Secondary | ICD-10-CM

## 2024-02-28 DIAGNOSIS — R600 Localized edema: Secondary | ICD-10-CM | POA: Diagnosis not present

## 2024-02-28 DIAGNOSIS — R2689 Other abnormalities of gait and mobility: Secondary | ICD-10-CM

## 2024-02-28 DIAGNOSIS — M25675 Stiffness of left foot, not elsewhere classified: Secondary | ICD-10-CM

## 2024-02-28 DIAGNOSIS — R2681 Unsteadiness on feet: Secondary | ICD-10-CM | POA: Diagnosis not present

## 2024-02-28 DIAGNOSIS — R252 Cramp and spasm: Secondary | ICD-10-CM

## 2024-02-28 NOTE — Therapy (Signed)
 OUTPATIENT PHYSICAL THERAPY NEURO TREATMENT    Patient Name: Michael [name] MRN: [id number] DOB:[birth year], 68 y.o., male Today's Date: 02/28/2024   PCP: Arcadio Knuckles, MD REFERRING PROVIDER: Persons, Norma Beckers, [place]   END OF SESSION:  PT End of Session - 02/28/24 1058     Visit Number 19    Number of Visits 29   17 + 12   Date for PT Re-Evaluation 03/20/24   pushed out due to aquatic needs   Authorization Type [employer] MEDICARE    Progress Note Due on Visit 10    PT Start Time 1058    PT Stop Time 1146    PT Time Calculation (min) 48 min    Equipment Utilized During Treatment Gait belt    Activity Tolerance Patient tolerated treatment well    Behavior During Therapy WFL for tasks assessed/performed             Past Medical History:  Diagnosis Date   Hyperlipidemia    Hypertension    Lumbar disc disease    Prostate cancer (HCC)    Past Surgical History:  Procedure Laterality Date   LUMBAR DISC SURGERY  2014   LUMBAR DISC SURGERY     right ankle fracture  1978   external pinning   Patient Active Problem List   Diagnosis Date Noted   Anemia due to zinc  deficiency 01/28/2024   Deficiency anemia 01/22/2024   Bradycardia, sinus 01/22/2024   Screening for colon cancer 01/22/2024   Need for prophylactic vaccination with combined diphtheria-tetanus-pertussis (DTP) vaccine 01/22/2024   Frequent unifocal PVCs 01/22/2024   MGUS (monoclonal gammopathy of unknown significance) 06/10/2023   RBC microcytosis 06/10/2023   DDD (degenerative disc disease), lumbar 12/21/2022   Prostate cancer (HCC); T1c; GG 1; initial diagnosis 2011 12/05/2022   Chronic hyperglycemia 11/16/2022   Dyslipidemia, goal LDL below 70 09/23/2022   Elevated PSA, between 10 and less than 20 ng/ml 09/17/2022   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   Vitamin D  deficiency 09/07/2021    ONSET DATE: symptoms began in 2020; pt had a fall August 23, 2023  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  He appreciates the front office help with getting back into his MyChart.  He finally almost has full access to his new phone.  He is still having to use lyft as his car remains in the shop.  Pt accompanied by: self  PERTINENT HISTORY: degenerative disc disease (followed by [facility]) s/p L5-S1 surgery, HLD, HTN, prostate cancer  Patient's symptoms started in 2020 - right flank pain (reported initially as abdominal pain, but pt clarifies this is not a deep cramp/stomach pain, but a muscular soreness especially with movement), core weakness (not able to sit up well), sneezing and coughing was painful as well.  A year later he noted stiffness in his legs after sitting where he has to walk up his legs with his hands.  Now, he reports numbness and soreness in left hamstring area as well as swelling and muscle spasms (left worse).  In order to walk he has to weight shift and bring feet into narrowed BOS then start with small steps.  He loosens up with movement. He reports freezing when startled. Ambulates w/ SPC.  Upper body remains WNL.  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  Pt has a fall August 23, 2023 halting his prior PT episode due to broken ribs.  PAIN:  Are you having pain? No  PRECAUTIONS: Fall  RED FLAGS: None-pt does report more urinary urgency that he reports corresponds with the reported itch in his legs and low back, but he has discussed this with his physician (Dr. Genita Keys)  WEIGHT BEARING RESTRICTIONS: No  FALLS: Has patient fallen in last 6 months? Yes. 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  He modifies some homemaking tasks, but reports he can stand longer to do things like cooking.  PATIENT GOALS: Work on upper body strength  OBJECTIVE:   DIAGNOSTIC FINDINGS:  EMG on 05/14/23 showed an active left S1 radiculopathy (similar to prior EMG)  X-ray Bone Survey Met (11/06/2023):   IMPRESSION: No evidence of lytic or destructive bone lesion  X-ray Ribs Unilateral Right (09/04/2023): Radiographs of his right ribs demonstrate no more blunting of the  costophrenic angle he does have vascular markings going out to the  periphery.   COGNITION: Overall cognitive status: Within functional limits for tasks assessed and pt hyperverbal   SENSATION: Light touch: WFL  COORDINATION: LE RAMS:  slow and deliberate Bilateral Heel-to-shin:  limited by lack of bilateral ER  EDEMA:  bilateral ankle edema (baseline per pt - he has been using castor oil and lavender to improve skin integrity - MD aware of regimen)  MUSCLE TONE: None noted in BLE  POSTURE: weight shift right  LOWER EXTREMITY ROM:      Active  Right Eval Left Eval  Hip flexion Grossly WFL - limited bilateral ER (basically at neutral  Hip extension   Hip abduction   Hip adduction   Hip internal rotation   Hip  external rotation   Knee flexion   Knee extension   Ankle dorsiflexion   Ankle plantarflexion    Ankle inversion    Ankle eversion     (Blank rows = not tested)  LOWER EXTREMITY MMT:    MMT Right Eval Left Eval  Hip flexion 3/5 3+/5  Hip extension    Hip abduction    Hip adduction    Hip internal rotation    Hip external rotation    Knee flexion    Knee extension 4+/5 4+/5  Ankle dorsiflexion 3+/5 4/5  Ankle plantarflexion    Ankle inversion    Ankle eversion    (Blank rows = not tested)  BED MOBILITY:  Pt reports his bed mobility has greatly improved and he has switched his setup so he enters and exits bed on side closest to his bathroom  TRANSFERS: Assistive device utilized: None  Sit to stand: Modified independence - uses hands Stand to sit: Modified independence Chair to chair: Complete Independence Floor:  pt performed modI on most recent fall  GAIT: Gait pattern: step through pattern, decreased step length- Right, decreased step length- Left, decreased stride length, decreased hip/knee flexion- Right, decreased hip/knee flexion- Left, lateral lean- Right, decreased trunk rotation, and narrow BOS Distance walked: various clinic distances Assistive device utilized: None Level of assistance: SBA Comments: No notable ataxia or involuntary movements during ambulation into/out of clinic.  FUNCTIONAL TESTS:  5 times sit to stand: 24.72 seconds w/ light BUE support - wide stance with some crouching into tall stand due to imbalance Timed up and go (TUG): To be assessed. 10 meter walk test: To be assessed.  PATIENT SURVEYS:  ABC scale To be assessed.  TODAY'S TREATMENT:    02/28/24                                                                                                                           -Pt requests SciFit warmup:  SciFit Hill mode level 7.0 using BUE/BLE support for dynamic cardiovascular challenge and large amplitude mobility.  Assessed STGs: - :  10.97 sec IND = 3.01 ft/sec OR 0.91 m/sec  -30 piece puzzle completed in 15 minutes and 36 seconds w/ min to mod cuing for strategies to improve spacial awareness and problem solving while pt retrieves pieces down 8 ft compliant platform and stands on airex to complete puzzle  PATIENT EDUCATION: Education details: Informed him of not planning to reschedule last aquatic appt due to his progress and aquatic therapist planning to send aquatic HEP to land therapist for continued work.  Continue HEP w/ additions and chair yoga.  Continue walking program.   Person educated: Patient Education method: Explanation Education comprehension: verbalized understanding and needs further education  HOME EXERCISE PROGRAM: Review from episode prior - modify as needed:  Access Code: [email or web address]  You Can Walk For A Certain Length Of Time Each Day (can start with 3-4 days and work up to everyday - use cane for  safety and walk in well lit familiar areas with smooth surfaces)                          Walk 5 minutes 2 times per day.             Increase 2  minutes every 7 days              Work up to 20 minutes (1x per day).               Example:                         Day 1-2           4-5 minutes     3 times per day                         Day 7-8           10-12 minutes 2-3 times per day                         Day 13-14       20-22 minutes 1-2 times per day  Chair yoga poses 1-6, 8-9 (modified position 9 and held x10 seconds), 12  GOALS: Goals reviewed with patient? Yes  SHORT TERM GOALS: Target date: 12/27/2023  Pt will decrease 5xSTS to </=19.72 seconds with improved immediate standing balance in order to demonstrate decreased risk for falls and improved functional bilateral LE strength and power. Baseline:  24.72  seconds w/ light BUE support; 15.97 sec w/ BUE support (3/3) Goal status: MET  2.  Pt will demonstrate TUG of </=17.09 seconds in order to decrease risk of falls and improve functional mobility using LRAD. Baseline: 22.09 sec no AD SBA (2/3); 12.86 sec no AD SBA (3/3) Goal status: MET  3.  Pt will demonstrate a gait speed of >/=2.52 feet/sec in order to decrease risk for falls. Baseline: 2.32 ft/sec (2/3); 2.74 ft/sec (3/3) Goal status: MET  LONG TERM GOALS: Target date: 01/24/2024  Pt will be independent and compliant with strength, stretching, and balance focused land-based HEP and aquatic HEP if continuing following discharge in order to maintain functional progress and improve mobility. Baseline:  IND/compliant per report (3/25) Goal status: MET  2.  Pt will decrease 5xSTS to </=14.72 seconds in order to demonstrate decreased risk for falls and improved functional bilateral LE strength and power. Baseline: 24.72 seconds w/ light BUE support; 16.47 sec w/ intermittent touch to initiate stand (3/25) Goal status: PARTIALLY MET  3.  Pt will demonstrate TUG of </=12.09 seconds in order to decrease risk of falls and improve functional mobility using LRAD. Baseline: 22.09 sec no AD SBA (2/3); 12.22 sec IND (3/25) Goal status: PARTIALLY MET  4.  Pt will demonstrate a gait speed of >/=2.92 feet/sec in order to decrease risk for falls. Baseline: 2.32 ft/sec (2/3); 2.74 ft/sec (3/3); 2.85 ft/sec (3/25) Goal status: PARTIALLY MET  5.  Pt will ambulate >/=800 feet at IND level over unlevel outdoor surfaces and grass to promote household and community access. Baseline: 1000 ft over unlevel and grass IND (3/25) Goal status: MET  6.  Pt will improve ABC Scale to >/= 69.4% in order to demonstrate improved fall risk and engagement in daily activities. Baseline: 59.4% (2/3); 98.94% (3/25) Goal status: MET  GOALS (re-cert  3/25) : Goals reviewed with patient? Yes  SHORT TERM GOALS: Target date:  02/14/2024  Pt will be independent and compliant with aquatic HEP in order to maintain functional progress and improve mobility. Baseline:  To be established. Goal status: ONGOING  2.  Pt will demonstrate a gait speed of >/=2.92 feet/sec in order to decrease risk for falls. Baseline: 2.32 ft/sec (2/3); 2.74 ft/sec (3/3); 2.85 ft/sec (3/25); 3.01 ft/sec (5/2) Goal status: MET  LONG TERM GOALS: Target date: 03/06/2024  1.  Pt will decrease 5xSTS to </=14.72 seconds in order to demonstrate decreased risk for falls and improved functional bilateral LE strength and power. Baseline: 24.72 seconds w/ light BUE support; 16.47 sec w/ intermittent touch to initiate stand (3/25) Goal status: INITIAL  2.  Pt will demonstrate TUG of </=10 seconds in order to decrease risk of falls and improve functional mobility using LRAD. Baseline: 22.09 sec no AD SBA (2/3); 12.22 sec IND (3/25) Goal status: REVISED  3.  Pt will demonstrate a gait speed of >/=3.12 feet/sec in order to decrease risk for falls. Baseline: 2.32 ft/sec (2/3); 2.74 ft/sec (3/3); 2.85 ft/sec (3/25) Goal status:  REVISED  ASSESSMENT:  CLINICAL IMPRESSION: Focus of skilled session today on addressing ongoing balance deficits and improving activity tolerance.  He continues to be able to ambulate without AD, but has persistent stiffness more affecting the proximal LE joints.  He continues to be challenged by need for redirection to task and spatial awareness as noted with puzzle task.  He continues to benefit from skilled PT in this setting to optimize functional mobility.  Continue per POC.  OBJECTIVE IMPAIRMENTS: decreased activity tolerance, decreased balance, decreased coordination, decreased endurance, decreased knowledge of condition, decreased mobility, difficulty walking, decreased strength, increased edema, and increased muscle spasms.   ACTIVITY LIMITATIONS: carrying, lifting, bending, sitting, standing, squatting, stairs, transfers,  bed mobility, and locomotion level  PARTICIPATION LIMITATIONS: shopping, community activity, and yard work  PERSONAL FACTORS: Age, Fitness, and 1-2 comorbidities: HTN, ongoing S1 radiculopathy  are also affecting patient's functional outcome.   REHAB POTENTIAL: Excellent  CLINICAL DECISION MAKING: Evolving/moderate complexity  EVALUATION COMPLEXITY: Moderate  PLAN:  PT FREQUENCY: 2x/week (1 land and 1 aquatic for initial 3 weeks of re-cert)  PT DURATION: 8 weeks + 6 weeks (7/82 re-cert)  PLANNED INTERVENTIONS: 95621- PT Re-evaluation, 97110-Therapeutic exercises, 97530- Therapeutic activity, 97112- Neuromuscular re-education, 97535- Self Care, [id number]- Manual therapy, [phone]- Gait training, [phone]- Orthotic Fit/training, [phone]- Aquatic Therapy, Patient/Family education, Balance training, Stair training, Taping, Dry Needling, Joint mobilization, Spinal mobilization, Vestibular training, DME instructions, Moist heat, Therapeutic exercises, Therapeutic activity, Neuromuscular re-education, Gait training, and Self Care  PLAN FOR NEXT SESSION: Modify land HEP for core and LE strength, stretching, and balance.  SciFit for reciprocal mobility.  Reaction time - blaze pods, balloon taps on compliant surfaces,  Upper body strength. Hip flexor strength.  Treadmill training.  Aquatics:  please establish simple aquatic HEP per pt request  Marilou Showman, PT, DPT 02/28/24 11:57 AM

## 2024-03-02 ENCOUNTER — Ambulatory Visit: Admitting: Physical Therapy

## 2024-03-02 ENCOUNTER — Encounter: Payer: Self-pay | Admitting: Physical Therapy

## 2024-03-02 DIAGNOSIS — M6281 Muscle weakness (generalized): Secondary | ICD-10-CM | POA: Diagnosis not present

## 2024-03-02 DIAGNOSIS — R2689 Other abnormalities of gait and mobility: Secondary | ICD-10-CM | POA: Diagnosis not present

## 2024-03-02 DIAGNOSIS — R2681 Unsteadiness on feet: Secondary | ICD-10-CM

## 2024-03-02 DIAGNOSIS — M25652 Stiffness of left hip, not elsewhere classified: Secondary | ICD-10-CM | POA: Diagnosis not present

## 2024-03-02 DIAGNOSIS — Z9181 History of falling: Secondary | ICD-10-CM

## 2024-03-02 DIAGNOSIS — R600 Localized edema: Secondary | ICD-10-CM | POA: Diagnosis not present

## 2024-03-02 DIAGNOSIS — M25672 Stiffness of left ankle, not elsewhere classified: Secondary | ICD-10-CM

## 2024-03-02 DIAGNOSIS — M25675 Stiffness of left foot, not elsewhere classified: Secondary | ICD-10-CM

## 2024-03-02 DIAGNOSIS — R252 Cramp and spasm: Secondary | ICD-10-CM

## 2024-03-02 DIAGNOSIS — M25662 Stiffness of left knee, not elsewhere classified: Secondary | ICD-10-CM

## 2024-03-02 NOTE — Therapy (Signed)
 OUTPATIENT PHYSICAL THERAPY NEURO TREATMENT    Patient Name: Michael [name] MRN: [id number] DOB:[birth year], 68 y.o., male Today's Date: 03/02/2024   PCP: Arcadio Knuckles, MD REFERRING PROVIDER: Persons, Norma Beckers, [place]   END OF SESSION:  PT End of Session - 03/02/24 1403     Visit Number 20    Number of Visits 29   17 + 12   Date for PT Re-Evaluation 03/20/24   pushed out due to aquatic needs   Authorization Type [employer] MEDICARE    Progress Note Due on Visit 10    PT Start Time 1400    PT Stop Time 1445    PT Time Calculation (min) 45 min    Equipment Utilized During Treatment Gait belt    Activity Tolerance Patient tolerated treatment well    Behavior During Therapy WFL for tasks assessed/performed             Past Medical History:  Diagnosis Date   Hyperlipidemia    Hypertension    Lumbar disc disease    Prostate cancer (HCC)    Past Surgical History:  Procedure Laterality Date   LUMBAR DISC SURGERY  2014   LUMBAR DISC SURGERY     right ankle fracture  1978   external pinning   Patient Active Problem List   Diagnosis Date Noted   Anemia due to zinc  deficiency 01/28/2024   Deficiency anemia 01/22/2024   Bradycardia, sinus 01/22/2024   Screening for colon cancer 01/22/2024   Need for prophylactic vaccination with combined diphtheria-tetanus-pertussis (DTP) vaccine 01/22/2024   Frequent unifocal PVCs 01/22/2024   MGUS (monoclonal gammopathy of unknown significance) 06/10/2023   RBC microcytosis 06/10/2023   DDD (degenerative disc disease), lumbar 12/21/2022   Prostate cancer (HCC); T1c; GG 1; initial diagnosis 2011 12/05/2022   Chronic hyperglycemia 11/16/2022   Dyslipidemia, goal LDL below 70 09/23/2022   Elevated PSA, between 10 and less than 20 ng/ml 09/17/2022   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   Vitamin D  deficiency 09/07/2021    ONSET DATE: symptoms began in 2020; pt had a fall August 23, 2023  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  He is having more spasms today.  He will pick up his medicines today.  He denies recent falls or further changes.   Pt accompanied by: self  PERTINENT HISTORY: degenerative disc disease (followed by [facility]) s/p L5-S1 surgery, HLD, HTN, prostate cancer  Patient's symptoms started in 2020 - right flank pain (reported initially as abdominal pain, but pt clarifies this is not a deep cramp/stomach pain, but a muscular soreness especially with movement), core weakness (not able to sit up well), sneezing and coughing was painful as well.  A year later he noted stiffness in his legs after sitting where he has to walk up his legs with his hands.  Now, he reports numbness and soreness in left hamstring area as well as swelling and muscle spasms (left worse).  In order to walk he has to weight shift and bring feet into narrowed BOS then start with small steps.  He loosens up with movement. He reports freezing when startled. Ambulates w/ SPC.  Upper body remains WNL.  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  Pt has a  fall August 23, 2023 halting his prior PT episode due to broken ribs.  PAIN:  Are you having pain? No  PRECAUTIONS: Fall  RED FLAGS: None-pt does report more urinary urgency that he reports corresponds with the reported itch in his legs and low back, but he has discussed this with his physician (Dr. Genita Keys)  WEIGHT BEARING RESTRICTIONS: No  FALLS: Has patient fallen in last 6 months? Yes. 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  He modifies some homemaking tasks, but reports he can stand longer to do things like cooking.  PATIENT GOALS: Work on upper body strength  OBJECTIVE:   DIAGNOSTIC FINDINGS:  EMG on 05/14/23 showed an active left S1 radiculopathy (similar to prior EMG)  X-ray Bone Survey Met (11/06/2023):   IMPRESSION: No evidence of lytic or destructive bone lesion  X-ray Ribs Unilateral Right (09/04/2023): Radiographs of his right ribs demonstrate no more blunting of the  costophrenic angle he does have vascular markings going out to the  periphery.   COGNITION: Overall cognitive status: Within functional limits for tasks assessed and pt hyperverbal   SENSATION: Light touch: WFL  COORDINATION: LE RAMS:  slow and deliberate Bilateral Heel-to-shin:  limited by lack of bilateral ER  EDEMA:  bilateral ankle edema (baseline per pt - he has been using castor oil and lavender to improve skin integrity - MD aware of regimen)  MUSCLE TONE: None noted in BLE  POSTURE: weight shift right  LOWER  EXTREMITY ROM:     Active  Right Eval Left Eval  Hip flexion Grossly WFL - limited bilateral ER (basically at neutral  Hip extension   Hip abduction   Hip adduction   Hip internal rotation  Hip external rotation   Knee flexion   Knee extension   Ankle dorsiflexion   Ankle plantarflexion    Ankle inversion    Ankle eversion     (Blank rows = not tested)  LOWER EXTREMITY MMT:    MMT Right Eval Left Eval  Hip flexion 3/5 3+/5  Hip extension    Hip abduction    Hip adduction    Hip internal rotation    Hip external rotation    Knee flexion    Knee extension 4+/5 4+/5  Ankle dorsiflexion 3+/5 4/5  Ankle plantarflexion    Ankle inversion    Ankle eversion    (Blank rows = not tested)  BED MOBILITY:  Pt reports his bed mobility has greatly improved and he has switched his setup so he enters and exits bed on side closest to his bathroom  TRANSFERS: Assistive device utilized: None  Sit to stand: Modified independence - uses hands Stand to sit: Modified independence Chair to chair: Complete Independence Floor:  pt performed modI on most recent fall  GAIT: Gait pattern: step through pattern, decreased step length- Right, decreased step length- Left, decreased stride length, decreased hip/knee flexion- Right, decreased hip/knee flexion- Left, lateral lean- Right, decreased trunk rotation, and narrow BOS Distance walked: various clinic distances Assistive device utilized: None Level of assistance: SBA Comments: No notable ataxia or involuntary movements during ambulation into/out of clinic.  FUNCTIONAL TESTS:  5 times sit to stand: 24.72 seconds w/ light BUE support - wide stance with some crouching into tall stand due to imbalance Timed up and go (TUG): To be assessed. 10 meter walk test: To be assessed.  PATIENT SURVEYS:  ABC scale To be assessed.  TODAY'S TREATMENT:    03/02/24                                                                                                                           -Treadmill training progressing to 1.6 mph and 4% incline over 8 minutes for improved cardiovascular endurance and general fluid mobility.  Pt able to hold winded conversation intermittently throughout. 5 Blaze pods on random one color taps setting for improved reaction time and side stepping.  Performed on 1 minute intervals with 30 second rest periods.  Pt requires SBA guarding. Round 1:  square mat table setup.  12 hits. Round 2:  " setup.  5 hits. Round 3:  " setup.  14 hits. Notable errors/deficits:  Mildly decreased knee and hip flexion during side stepping with LLE.  5 Blaze pods on random one color taps setting for improved hip and knee flexor engagement, reaction time, and SLS.  Performed on 1 minute intervals with 30 second rest periods.  Pt requires SBA guarding. Round 1:  3 stair pod setup.  21 hits. Round 2:  " setup.  33 hits. Round 3:  " setup.  35 hits. Notable errors/deficits:  Intermittent UE support, left hamstring fatigue noted with compensation during highest  target, pt reports some cramping sensation at end range with repeated rounds.  -Balloon taps standing in wide stance on foam beam, intermittent touch support to bars, 2 rounds  PATIENT EDUCATION: Education details: Informed him that land therapist will make aquatic HEP if not hearing from aquatic therapist by last scheduled visit.  Continue HEP w/ additions and chair yoga.  Continue walking program.   Person educated: Patient Education method: Explanation Education comprehension: verbalized understanding and needs further education  HOME EXERCISE PROGRAM: Review from episode prior - modify as needed:  Access Code: [email or web address]  You Can Walk For A Certain Length Of Time Each Day (can start with 3-4 days and work up to everyday - use cane for safety and walk in well lit familiar areas with smooth surfaces)                          Walk 5 minutes 2 times per day.             Increase 2   minutes every 7 days              Work up to 20 minutes (1x per day).               Example:                         Day 1-2           4-5 minutes     3 times per day                         Day 7-8           10-12 minutes 2-3 times per day                         Day 13-14       20-22 minutes 1-2 times per day  Chair yoga poses 1-6, 8-9 (modified position 9 and held x10 seconds), 12  GOALS: Goals reviewed with patient? Yes  SHORT TERM GOALS: Target date: 12/27/2023  Pt will decrease 5xSTS to </=19.72 seconds with improved immediate standing balance in order to demonstrate decreased risk for falls and improved functional bilateral LE strength and power. Baseline:  24.72 seconds w/ light BUE support; 15.97 sec w/ BUE support (3/3) Goal status: MET  2.  Pt will demonstrate TUG of </=17.09 seconds in order to decrease risk of falls and improve functional mobility using LRAD. Baseline: 22.09 sec no AD SBA (2/3); 12.86 sec no AD SBA (3/3) Goal status: MET  3.  Pt will demonstrate a gait speed of >/=2.52 feet/sec in order to decrease risk for falls. Baseline: 2.32 ft/sec (2/3); 2.74 ft/sec (3/3) Goal status: MET  LONG TERM GOALS: Target date: 01/24/2024  Pt will be independent and compliant with strength, stretching, and balance focused land-based HEP and aquatic HEP if continuing following discharge in order to maintain functional progress and improve mobility. Baseline:  IND/compliant per report (3/25) Goal status: MET  2.  Pt will decrease 5xSTS to </=14.72 seconds in order to demonstrate decreased risk for falls and improved functional bilateral LE strength and power. Baseline: 24.72 seconds w/ light BUE support; 16.47 sec w/ intermittent touch to initiate stand (3/25) Goal status: PARTIALLY MET  3.  Pt will demonstrate TUG of </=12.09 seconds in order to decrease risk of falls  and improve functional mobility using LRAD. Baseline: 22.09 sec no AD SBA (2/3); 12.22 sec IND  (3/25) Goal status: PARTIALLY MET  4.  Pt will demonstrate a gait speed of >/=2.92 feet/sec in order to decrease risk for falls. Baseline: 2.32 ft/sec (2/3); 2.74 ft/sec (3/3); 2.85 ft/sec (3/25) Goal status: PARTIALLY MET  5.  Pt will ambulate >/=800 feet at IND level over unlevel outdoor surfaces and grass to promote household and community access. Baseline: 1000 ft over unlevel and grass IND (3/25) Goal status: MET  6.  Pt will improve ABC Scale to >/= 69.4% in order to demonstrate improved fall risk and engagement in daily activities. Baseline: 59.4% (2/3); 98.94% (3/25) Goal status: MET  GOALS (re-cert 1/61) : Goals reviewed with patient? Yes  SHORT TERM GOALS: Target date: 02/14/2024  Pt will be independent and compliant with aquatic HEP in order to maintain functional progress and improve mobility. Baseline:  To be established. Goal status: ONGOING  2.  Pt will demonstrate a gait speed of >/=2.92 feet/sec in order to decrease risk for falls. Baseline: 2.32 ft/sec (2/3); 2.74 ft/sec (3/3); 2.85 ft/sec (3/25); 3.01 ft/sec (5/2) Goal status: MET  LONG TERM GOALS: Target date: 03/06/2024  1.  Pt will decrease 5xSTS to </=14.72 seconds in order to demonstrate decreased risk for falls and improved functional bilateral LE strength and power. Baseline: 24.72 seconds w/ light BUE support; 16.47 sec w/ intermittent touch to initiate stand (3/25) Goal status: INITIAL  2.  Pt will demonstrate TUG of </=10 seconds in order to decrease risk of falls and improve functional mobility using LRAD. Baseline: 22.09 sec no AD SBA (2/3); 12.22 sec IND (3/25) Goal status: REVISED  3.  Pt will demonstrate a gait speed of >/=3.12 feet/sec in order to decrease risk for falls. Baseline: 2.32 ft/sec (2/3); 2.74 ft/sec (3/3); 2.85 ft/sec (3/25) Goal status:  REVISED  ASSESSMENT:  CLINICAL IMPRESSION: Focus of skilled session today on continued work on reaction time, static balance, and general  fluidity of movement.  His stiffness appeared worse today as he has been without muscle relaxers for several days.  He continues to have positive response to cardiac and gait focused warmup on the treadmill which loosened him up.  He was very challenged by compliant surface this visit and would benefit from further work on ankle strategy and negotiating soft surfaces.  Will continue per POC.  OBJECTIVE IMPAIRMENTS: decreased activity tolerance, decreased balance, decreased coordination, decreased endurance, decreased knowledge of condition, decreased mobility, difficulty walking, decreased strength, increased edema, and increased muscle spasms.   ACTIVITY LIMITATIONS: carrying, lifting, bending, sitting, standing, squatting, stairs, transfers, bed mobility, and locomotion level  PARTICIPATION LIMITATIONS: shopping, community activity, and yard work  PERSONAL FACTORS: Age, Fitness, and 1-2 comorbidities: HTN, ongoing S1 radiculopathy  are also affecting patient's functional outcome.   REHAB POTENTIAL: Excellent  CLINICAL DECISION MAKING: Evolving/moderate complexity  EVALUATION COMPLEXITY: Moderate  PLAN:  PT FREQUENCY: 2x/week (1 land and 1 aquatic for initial 3 weeks of re-cert)  PT DURATION: 8 weeks + 6 weeks (0/96 re-cert)  PLANNED INTERVENTIONS: 04540- PT Re-evaluation, 97110-Therapeutic exercises, 97530- Therapeutic activity, 97112- Neuromuscular re-education, 97535- Self Care, 98119- Manual therapy, [phone]- Gait training, [phone]- Orthotic Fit/training, [phone]- Aquatic Therapy, Patient/Family education, Balance training, Stair training, Taping, Dry Needling, Joint mobilization, Spinal mobilization, Vestibular training, DME instructions, Moist heat, Therapeutic exercises, Therapeutic activity, Neuromuscular re-education, Gait training, and Self Care  PLAN FOR NEXT SESSION: Modify land HEP for core and LE strength, stretching, and balance.  SciFit for reciprocal mobility.  Upper body  strength. Hip flexor strength.  Aquatics:  please establish simple aquatic HEP per pt request  Marilou Showman, PT, DPT 03/02/24 2:46 PM

## 2024-03-04 ENCOUNTER — Encounter: Payer: Self-pay | Admitting: Physical Therapy

## 2024-03-04 ENCOUNTER — Ambulatory Visit (HOSPITAL_BASED_OUTPATIENT_CLINIC_OR_DEPARTMENT_OTHER): Admitting: Physical Therapy

## 2024-03-04 ENCOUNTER — Ambulatory Visit: Admitting: Physical Therapy

## 2024-03-04 DIAGNOSIS — M25672 Stiffness of left ankle, not elsewhere classified: Secondary | ICD-10-CM | POA: Diagnosis not present

## 2024-03-04 DIAGNOSIS — M25675 Stiffness of left foot, not elsewhere classified: Secondary | ICD-10-CM | POA: Diagnosis not present

## 2024-03-04 DIAGNOSIS — R252 Cramp and spasm: Secondary | ICD-10-CM | POA: Diagnosis not present

## 2024-03-04 DIAGNOSIS — M6281 Muscle weakness (generalized): Secondary | ICD-10-CM

## 2024-03-04 DIAGNOSIS — M25662 Stiffness of left knee, not elsewhere classified: Secondary | ICD-10-CM

## 2024-03-04 DIAGNOSIS — M25652 Stiffness of left hip, not elsewhere classified: Secondary | ICD-10-CM

## 2024-03-04 DIAGNOSIS — R2689 Other abnormalities of gait and mobility: Secondary | ICD-10-CM

## 2024-03-04 DIAGNOSIS — Z9181 History of falling: Secondary | ICD-10-CM

## 2024-03-04 DIAGNOSIS — R2681 Unsteadiness on feet: Secondary | ICD-10-CM | POA: Diagnosis not present

## 2024-03-04 DIAGNOSIS — R600 Localized edema: Secondary | ICD-10-CM | POA: Diagnosis not present

## 2024-03-04 NOTE — Therapy (Signed)
 OUTPATIENT PHYSICAL THERAPY NEURO TREATMENT    Patient Name: Michael [name] MRN: [id number] DOB:[birth year], 68 y.o., male Today's Date: 03/04/2024   PCP: Arcadio Knuckles, MD REFERRING PROVIDER: Persons, Norma Beckers, [place]   END OF SESSION:  PT End of Session - 03/04/24 1451     Visit Number 21    Number of Visits 29   17 + 12   Date for PT Re-Evaluation 03/20/24   pushed out due to aquatic needs   Authorization Type [employer] MEDICARE    Progress Note Due on Visit 10    PT Start Time 1440    PT Stop Time 1537    PT Time Calculation (min) 57 min    Equipment Utilized During Treatment Gait belt    Activity Tolerance Patient tolerated treatment well    Behavior During Therapy WFL for tasks assessed/performed             Past Medical History:  Diagnosis Date   Hyperlipidemia    Hypertension    Lumbar disc disease    Prostate cancer (HCC)    Past Surgical History:  Procedure Laterality Date   LUMBAR DISC SURGERY  2014   LUMBAR DISC SURGERY     right ankle fracture  1978   external pinning   Patient Active Problem List   Diagnosis Date Noted   Anemia due to zinc  deficiency 01/28/2024   Deficiency anemia 01/22/2024   Bradycardia, sinus 01/22/2024   Screening for colon cancer 01/22/2024   Need for prophylactic vaccination with combined diphtheria-tetanus-pertussis (DTP) vaccine 01/22/2024   Frequent unifocal PVCs 01/22/2024   MGUS (monoclonal gammopathy of unknown significance) 06/10/2023   RBC microcytosis 06/10/2023   DDD (degenerative disc disease), lumbar 12/21/2022   Prostate cancer (HCC); T1c; GG 1; initial diagnosis 2011 12/05/2022   Chronic hyperglycemia 11/16/2022   Dyslipidemia, goal LDL below 70 09/23/2022   Elevated PSA, between 10 and less than 20 ng/ml 09/17/2022   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   Vitamin D  deficiency 09/07/2021    ONSET DATE: symptoms began in 2020; pt had a fall August 23, 2023  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  He feels he can reach his ankles to bathe better.  He denies recent falls or further changes.   Pt accompanied by: self  PERTINENT HISTORY: degenerative disc disease (followed by [facility]) s/p L5-S1 surgery, HLD, HTN, prostate cancer  Patient's symptoms started in 2020 - right flank pain (reported initially as abdominal pain, but pt clarifies this is not a deep cramp/stomach pain, but a muscular soreness especially with movement), core weakness (not able to sit up well), sneezing and coughing was painful as well.  A year later he noted stiffness in his legs after sitting where he has to walk up his legs with his hands.  Now, he reports numbness and soreness in left hamstring area as well as swelling and muscle spasms (left worse).  In order to walk he has to weight shift and bring feet into narrowed BOS then start with small steps.  He loosens up with movement. He reports freezing when startled. Ambulates w/ SPC.  Upper body remains WNL.  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  Pt has a fall August 23, 2023 halting  his prior PT episode due to broken ribs.  PAIN:  Are you having pain? No  PRECAUTIONS: Fall  RED FLAGS: None-pt does report more urinary urgency that he reports corresponds with the reported itch in his legs and low back, but he has discussed this with his physician (Dr. Genita Keys)  WEIGHT BEARING RESTRICTIONS: No  FALLS: Has patient fallen in last 6 months? Yes. 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  He modifies some homemaking tasks, but reports he can stand longer to do things like cooking.  PATIENT GOALS: Work on upper body strength  OBJECTIVE:   DIAGNOSTIC FINDINGS:  EMG on 05/14/23 showed an active left S1 radiculopathy (similar to prior EMG)  X-ray Bone Survey Met (11/06/2023):   IMPRESSION: No evidence of lytic or destructive bone lesion  X-ray Ribs Unilateral Right (09/04/2023): Radiographs of his right ribs demonstrate no more blunting of the  costophrenic angle he does have vascular markings going out to the  periphery.   COGNITION: Overall cognitive status: Within functional limits for tasks assessed and pt hyperverbal   SENSATION: Light touch: WFL  COORDINATION: LE RAMS:  slow and deliberate Bilateral Heel-to-shin:  limited by lack of bilateral ER  EDEMA:  bilateral ankle edema (baseline per pt - he has been using castor oil and lavender to improve skin integrity - MD aware of regimen)  MUSCLE TONE: None noted in BLE  POSTURE: weight shift right  LOWER EXTREMITY ROM:     Active   Right Eval Left Eval  Hip flexion Grossly WFL - limited bilateral ER (basically at neutral  Hip extension   Hip abduction   Hip adduction   Hip internal rotation   Hip external rotation   Knee  flexion   Knee extension   Ankle dorsiflexion   Ankle plantarflexion    Ankle inversion    Ankle eversion     (Blank rows = not tested)  LOWER EXTREMITY MMT:    MMT Right Eval Left Eval  Hip flexion 3/5 3+/5  Hip extension    Hip abduction    Hip adduction    Hip internal rotation    Hip external rotation    Knee flexion    Knee extension 4+/5 4+/5  Ankle dorsiflexion 3+/5 4/5  Ankle plantarflexion    Ankle inversion    Ankle eversion    (Blank rows = not tested)  BED MOBILITY:  Pt reports his bed mobility has greatly improved and he has switched his setup so he enters and exits bed on side closest to his bathroom  TRANSFERS: Assistive device utilized: None  Sit to stand: Modified independence - uses hands Stand to sit: Modified independence Chair to chair: Complete Independence Floor:  pt performed modI on most recent fall  GAIT: Gait pattern: step through pattern, decreased step length- Right, decreased step length- Left, decreased stride length, decreased hip/knee flexion- Right, decreased hip/knee flexion- Left, lateral lean- Right, decreased trunk rotation, and narrow BOS Distance walked: various clinic distances Assistive device utilized: None Level of assistance: SBA Comments: No notable ataxia or involuntary movements during ambulation into/out of clinic.  FUNCTIONAL TESTS:  5 times sit to stand: 24.72 seconds w/ light BUE support - wide stance with some crouching into tall stand due to imbalance Timed up and go (TUG): To be assessed. 10 meter walk test: To be assessed.  PATIENT SURVEYS:  ABC scale To be assessed.  TODAY'S TREATMENT:    03/04/24                                                                                                                           -SciFit in sprints mode x10 minutes at level 6.0 for dynamic cardiovascular warmup and neural priming to improve ambulatory mechanics secondary to stiffness.  Pt able to hold winded conversation intermittently throughout maintaining pace of 100-130 steps/min. 4 Blaze pods on random one color taps setting for improved core engagement and coordination.  Performed on 1 minute intervals with 30 second rest periods.  Pt requires no guarding. Round 1:  Pt in quadruped on mat table w/ 4 corner pod setup.  29 hits. Round 2:  " setup.  25 hits. Round 3:  " setup.  33 hits. Notable errors/deficits:  Pt has great lumbar posture throughout and is able to utilize far reach as well as IR/ER of hip to improve engagement to task over repetition.  -Red (firm) to blue (soft) mat quadruped crawls for "weeding" flower bed and "replanting" flowers (dual task colors assigned to squigz pulls from floor for sorting) > repeated "planting" w/ sorting task in standing on blue soft mat > standing pulling squigz from floor standing on blue soft mat -quadruped UE ramp climb  w/ alternating reach for core engagement x10  PATIENT EDUCATION: Education details: Informed him that land therapist will pick up aquatic HEP tomorrow and verbally review at next land appt.  Continue HEP w/ additions and chair yoga.  Continue walking program.  Discussed likelihood of re-cert for 4 wks of land only at 5/12 appt - pt in agreement.  Discussed desire to work on more hip and knee mobility in future POC. Person educated: Patient Education method: Explanation Education comprehension: verbalized understanding and needs further education  HOME EXERCISE PROGRAM: Review from episode prior - modify as needed:  Access Code: [email or web address]  You Can Walk For A Certain Length Of Time Each Day (can start with 3-4 days and work up to everyday - use cane for safety and walk in well lit familiar areas with smooth surfaces)                          Walk 5  minutes 2 times per day.             Increase 2  minutes every 7 days              Work up to 20 minutes (1x per day).               Example:                         Day 1-2           4-5 minutes     3 times per day                         Day 7-8           10-12 minutes 2-3 times per day                         Day 13-14       20-22 minutes 1-2 times per day  Chair yoga poses 1-6, 8-9 (modified position 9 and held x10 seconds), 12  GOALS: Goals reviewed with patient? Yes  SHORT TERM GOALS: Target date: 12/27/2023  Pt will decrease 5xSTS to </=19.72 seconds with improved immediate standing balance in order to demonstrate decreased risk for falls and improved functional bilateral LE strength and power. Baseline:  24.72 seconds w/ light BUE support; 15.97 sec w/ BUE support (3/3) Goal status: MET  2.  Pt will demonstrate TUG of </=17.09 seconds in order to decrease risk of falls and improve functional mobility using LRAD. Baseline: 22.09 sec no AD SBA (2/3); 12.86 sec no AD SBA (3/3) Goal status: MET  3.  Pt will demonstrate a gait speed of >/=2.52 feet/sec in order to decrease risk for falls. Baseline: 2.32 ft/sec (2/3); 2.74 ft/sec (3/3) Goal status: MET  LONG TERM GOALS: Target date: 01/24/2024  Pt will be independent and compliant with strength, stretching, and balance focused land-based HEP and aquatic HEP if continuing following discharge in order to maintain functional progress and improve mobility. Baseline:  IND/compliant per report (3/25) Goal status: MET  2.  Pt will decrease 5xSTS to </=14.72 seconds in order to demonstrate decreased risk for falls and improved functional bilateral LE strength and power. Baseline: 24.72 seconds w/ light BUE support; 16.47 sec w/ intermittent touch to initiate stand (3/25) Goal status: PARTIALLY MET  3.  Pt will demonstrate TUG of </=12.09 seconds  in order to decrease risk of falls and improve functional mobility using LRAD. Baseline:  22.09 sec no AD SBA (2/3); 12.22 sec IND (3/25) Goal status: PARTIALLY MET  4.  Pt will demonstrate a gait speed of >/=2.92 feet/sec in order to decrease risk for falls. Baseline: 2.32 ft/sec (2/3); 2.74 ft/sec (3/3); 2.85 ft/sec (3/25) Goal status: PARTIALLY MET  5.  Pt will ambulate >/=800 feet at IND level over unlevel outdoor surfaces and grass to promote household and community access. Baseline: 1000 ft over unlevel and grass IND (3/25) Goal status: MET  6.  Pt will improve ABC Scale to >/= 69.4% in order to demonstrate improved fall risk and engagement in daily activities. Baseline: 59.4% (2/3); 98.94% (3/25) Goal status: MET  GOALS (re-cert 4/09) : Goals reviewed with patient? Yes  SHORT TERM GOALS: Target date: 02/14/2024  Pt will be independent and compliant with aquatic HEP in order to maintain functional progress and improve mobility. Baseline:  To be established. Goal status: ONGOING  2.  Pt will demonstrate a gait speed of >/=2.92 feet/sec in order to decrease risk for falls. Baseline: 2.32 ft/sec (2/3); 2.74 ft/sec (3/3); 2.85 ft/sec (3/25); 3.01 ft/sec (5/2) Goal status: MET  LONG TERM GOALS: Target date: 03/06/2024  1.  Pt will decrease 5xSTS to </=14.72 seconds in order to demonstrate decreased risk for falls and improved functional bilateral LE strength and power. Baseline: 24.72 seconds w/ light BUE support; 16.47 sec w/ intermittent touch to initiate stand (3/25) Goal status: INITIAL  2.  Pt will demonstrate TUG of </=10 seconds in order to decrease risk of falls and improve functional mobility using LRAD. Baseline: 22.09 sec no AD SBA (2/3); 12.22 sec IND (3/25) Goal status: REVISED  3.  Pt will demonstrate a gait speed of >/=3.12 feet/sec in order to decrease risk for falls. Baseline: 2.32 ft/sec (2/3); 2.74 ft/sec (3/3); 2.85 ft/sec (3/25) Goal status:  REVISED  ASSESSMENT:  CLINICAL IMPRESSION: Emphasis of skilled session today on addressing various  core engagement and stability challenges in quadruped and standing on compliant surfaces.  He demonstrates great stamina during continuous tasks, but was challenged by UE ramp climb core demand.  Patient would further benefit from hip and knee mobility work and requests to address this in future POC.  PT will plan to reassess LTGs anticipating progress made and re-cert for short land POC to ensure these factors are worked on.  Continue per POC.  OBJECTIVE IMPAIRMENTS: decreased activity tolerance, decreased balance, decreased coordination, decreased endurance, decreased knowledge of condition, decreased mobility, difficulty walking, decreased strength, increased edema, and increased muscle spasms.   ACTIVITY LIMITATIONS: carrying, lifting, bending, sitting, standing, squatting, stairs, transfers, bed mobility, and locomotion level  PARTICIPATION LIMITATIONS: shopping, community activity, and yard work  PERSONAL FACTORS: Age, Fitness, and 1-2 comorbidities: HTN, ongoing S1 radiculopathy  are also affecting patient's functional outcome.   REHAB POTENTIAL: Excellent  CLINICAL DECISION MAKING: Evolving/moderate complexity  EVALUATION COMPLEXITY: Moderate  PLAN:  PT FREQUENCY: 2x/week (1 land and 1 aquatic for initial 3 weeks of re-cert)  PT DURATION: 8 weeks + 6 weeks (8/11 re-cert)  PLANNED INTERVENTIONS: 91478- PT Re-evaluation, 97110-Therapeutic exercises, 97530- Therapeutic activity, 97112- Neuromuscular re-education, 97535- Self Care, [id number]- Manual therapy, [phone]- Gait training, [phone]- Orthotic Fit/training, [phone]- Aquatic Therapy, Patient/Family education, Balance training, Stair training, Taping, Dry Needling, Joint mobilization, Spinal mobilization, Vestibular training, DME instructions, Moist heat, Therapeutic exercises, Therapeutic activity, Neuromuscular re-education, Gait training, and Self Care  PLAN FOR NEXT SESSION: Modify land HEP  for core and LE strength, stretching, and balance.   SciFit for reciprocal mobility.  Upper body strength. Hip flexor strength.  Hip IR/ER, lower trunk mobility, knee mobility  REVIEW AQUATIC HEP.  ASSESS LTGs w/ re-cert 1x/wk for 4 wks land only.  Marilou Showman, PT, DPT 03/04/24 3:53 PM

## 2024-03-09 ENCOUNTER — Encounter: Payer: Self-pay | Admitting: Physical Therapy

## 2024-03-09 ENCOUNTER — Ambulatory Visit: Admitting: Physical Therapy

## 2024-03-09 DIAGNOSIS — M25675 Stiffness of left foot, not elsewhere classified: Secondary | ICD-10-CM | POA: Diagnosis not present

## 2024-03-09 DIAGNOSIS — M25652 Stiffness of left hip, not elsewhere classified: Secondary | ICD-10-CM

## 2024-03-09 DIAGNOSIS — R2681 Unsteadiness on feet: Secondary | ICD-10-CM | POA: Diagnosis not present

## 2024-03-09 DIAGNOSIS — R600 Localized edema: Secondary | ICD-10-CM

## 2024-03-09 DIAGNOSIS — M6281 Muscle weakness (generalized): Secondary | ICD-10-CM | POA: Diagnosis not present

## 2024-03-09 DIAGNOSIS — M25662 Stiffness of left knee, not elsewhere classified: Secondary | ICD-10-CM | POA: Diagnosis not present

## 2024-03-09 DIAGNOSIS — M25672 Stiffness of left ankle, not elsewhere classified: Secondary | ICD-10-CM | POA: Diagnosis not present

## 2024-03-09 DIAGNOSIS — R2689 Other abnormalities of gait and mobility: Secondary | ICD-10-CM

## 2024-03-09 DIAGNOSIS — R252 Cramp and spasm: Secondary | ICD-10-CM | POA: Diagnosis not present

## 2024-03-09 DIAGNOSIS — Z9181 History of falling: Secondary | ICD-10-CM | POA: Diagnosis not present

## 2024-03-09 NOTE — Therapy (Signed)
 OUTPATIENT PHYSICAL THERAPY NEURO TREATMENT - RECERTIFICATION   Patient Name: Michael [name] MRN: [id number] DOB:12-Dec-1955, 68 y.o., male Today's Date: 03/09/2024   PCP: Arcadio Knuckles, MD REFERRING PROVIDER: Persons, Norma Beckers, [place]   END OF SESSION:  PT End of Session - 03/09/24 1415     Visit Number 22    Number of Visits 33   29 + 4   Date for PT Re-Evaluation 04/17/24   pushed out due to possible scheduling delay   Authorization Type [employer] MEDICARE    Progress Note Due on Visit 10    PT Start Time 1404    PT Stop Time 1446    PT Time Calculation (min) 42 min    Equipment Utilized During Treatment Gait belt    Activity Tolerance Patient tolerated treatment well    Behavior During Therapy WFL for tasks assessed/performed             Past Medical History:  Diagnosis Date   Hyperlipidemia    Hypertension    Lumbar disc disease    Prostate cancer (HCC)    Past Surgical History:  Procedure Laterality Date   LUMBAR DISC SURGERY  2014   LUMBAR DISC SURGERY     right ankle fracture  1978   external pinning   Patient Active Problem List   Diagnosis Date Noted   Anemia due to zinc  deficiency 01/28/2024   Deficiency anemia 01/22/2024   Bradycardia, sinus 01/22/2024   Screening for colon cancer 01/22/2024   Need for prophylactic vaccination with combined diphtheria-tetanus-pertussis (DTP) vaccine 01/22/2024   Frequent unifocal PVCs 01/22/2024   MGUS (monoclonal gammopathy of unknown significance) 06/10/2023   RBC microcytosis 06/10/2023   DDD (degenerative disc disease), lumbar 12/21/2022   Prostate cancer (HCC); T1c; GG 1; initial diagnosis 2011 12/05/2022   Chronic hyperglycemia 11/16/2022   Dyslipidemia, goal LDL below 70 09/23/2022   Elevated PSA, between 10 and less than 20 ng/ml 09/17/2022   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   Vitamin D  deficiency 09/07/2021    ONSET DATE: symptoms began in 2020; pt had a fall August 23, 2023  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  He was very careful so he didn't fall getting on and off the ground.  He denies recent falls or further changes.   Pt accompanied by: self  PERTINENT HISTORY: degenerative disc disease (followed by [facility]) s/p L5-S1 surgery, HLD, HTN, prostate cancer  Patient's symptoms started in 2020 - right flank pain (reported initially as abdominal pain, but pt clarifies this is not a deep cramp/stomach pain, but a muscular soreness especially with movement), core weakness (not able to sit up well), sneezing and coughing was painful as well.  A year later he noted stiffness in his legs after sitting where he has to walk up his legs with his hands.  Now, he reports numbness and soreness in left hamstring area as well as swelling and muscle spasms (left worse).  In order to walk he has to weight shift and bring feet into narrowed BOS then start with small steps.  He loosens up with movement. He reports freezing when startled. Ambulates w/ SPC.  Upper body remains WNL.  Is being followed by hematology for possible thalassemia due to initial labs being positive for IgG kappa monoclonal antibody  on 05/03/2023.  Pt has a fall August 23, 2023 halting his prior PT episode due to broken ribs.  PAIN:  Are you having pain? No  PRECAUTIONS: Fall  RED FLAGS: None-pt does report more urinary urgency that he reports corresponds with the reported itch in his legs and low back, but he has discussed this with his physician (Dr. Genita Keys)  WEIGHT BEARING RESTRICTIONS: No  FALLS: Has patient fallen in last 6 months? Yes. 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  He modifies some homemaking tasks, but reports he can stand longer to do things like cooking.  PATIENT GOALS: Work on upper body strength  OBJECTIVE:   DIAGNOSTIC FINDINGS:  EMG on 05/14/23 showed an active left S1 radiculopathy (similar to prior EMG)  X-ray Bone Survey Met (11/06/2023):   IMPRESSION: No evidence of lytic or destructive bone lesion  X-ray Ribs Unilateral Right (09/04/2023): Radiographs of his right ribs demonstrate no more blunting of the  costophrenic angle he does have vascular markings going out to the  periphery.   COGNITION: Overall cognitive status: Within functional limits for tasks assessed and pt hyperverbal   SENSATION: Light touch: WFL  COORDINATION: LE RAMS:  slow and deliberate Bilateral Heel-to-shin:  limited by lack of bilateral ER  EDEMA:  bilateral ankle edema (baseline per pt - he has been using castor oil and lavender to improve skin integrity - MD aware of regimen)  MUSCLE TONE: None noted in BLE  POSTURE:  weight shift right  LOWER EXTREMITY ROM:     Active  Right Eval Left Eval  Hip flexion Grossly WFL - limited bilateral ER (basically at neutral  Hip extension   Hip abduction   Hip adduction   Hip internal  rotation   Hip external rotation   Knee flexion   Knee extension   Ankle dorsiflexion   Ankle plantarflexion    Ankle inversion    Ankle eversion     (Blank rows = not tested)  LOWER EXTREMITY MMT:    MMT Right Eval Left Eval  Hip flexion 3/5 3+/5  Hip extension    Hip abduction    Hip adduction    Hip internal rotation    Hip external rotation    Knee flexion    Knee extension 4+/5 4+/5  Ankle dorsiflexion 3+/5 4/5  Ankle plantarflexion    Ankle inversion    Ankle eversion    (Blank rows = not tested)  BED MOBILITY:  Pt reports his bed mobility has greatly improved and he has switched his setup so he enters and exits bed on side closest to his bathroom  TRANSFERS: Assistive device utilized: None  Sit to stand: Modified independence - uses hands Stand to sit: Modified independence Chair to chair: Complete Independence Floor: pt performed modI on most recent fall  GAIT: Gait pattern: step through pattern, decreased step length- Right, decreased step length- Left, decreased stride length, decreased hip/knee flexion- Right, decreased hip/knee flexion- Left, lateral lean- Right, decreased trunk rotation, and narrow BOS Distance walked: various clinic distances Assistive device utilized: None Level of assistance: SBA Comments: No notable ataxia or involuntary movements during ambulation into/out of clinic.  FUNCTIONAL TESTS:  5 times sit to stand: 24.72 seconds w/ light BUE support - wide stance with some crouching into tall stand due to imbalance Timed up and go (TUG): To be assessed. 10 meter walk test: To be assessed.  PATIENT SURVEYS:  ABC scale To be assessed.  TODAY'S TREATMENT:    03/09/24                                                                                                                           -SciFit in multi-peaks mode x8 minutes up to level 7.0 for dynamic cardiovascular warmup and neural priming to improve ambulatory mechanics secondary to stiffness.  Pt able to hold winded conversation intermittently throughout maintaining pace of 80-115 steps/min.  LTG assessment: -Land PT put together and reviewed aquatic HEP w/ pt in session -5xSTS no UE support:  17.31 sec (1st attempt, LOB on rep 4); 14.87 sec (2nd attempt) -TUG:  10.75 sec IND - :  10.40 sec IND = 0.96 m/sec OR 3.17 ft/sec  PATIENT EDUCATION: Education details: Aquatic HEP.  Continue HEP w/ additions and chair yoga.  Continue walking program.  Discussed re-cert process and continuing on land to address hip and knee mobility as requested in session prior.  Progress towards goals. Person educated: Patient Education method: Explanation Education comprehension: verbalized understanding and needs further education  HOME EXERCISE PROGRAM: Review from episode prior - modify as needed:  Access Code: [email or web address]  You Can Walk For A Certain Length Of Time Each Day (can start with 3-4 days and  work up to everyday - use cane for safety and walk in well lit familiar areas with smooth surfaces)                          Walk 5 minutes 2 times per day.             Increase 2  minutes every 7 days              Work up to 20 minutes (1x per day).               Example:                         Day 1-2           4-5 minutes     3 times per day                         Day 7-8           10-12 minutes 2-3 times per day                         Day 13-14       20-22 minutes 1-2 times per day  Chair yoga poses 1-6, 8-9 (modified position 9 and held x10 seconds), 12  Access Code: [email or web address] URL: [email or web address] Date: 03/09/2024 Prepared by: Marilou Showman  Exercises - Piriformis Stretch at [employer]  - 1 x daily - 1-2 x weekly - 3 sets - 10 reps -  Standing 3-Way Kick with Ankle Float at [employer]  - 1 x daily - 1-2 x weekly - 3 sets - 10 reps - Hip Flexor Stretch at [employer]  - 1 x daily - 1-2 x weekly - 3 sets - 10 reps - Bilateral Shoulder Flexion Extension with Hand Floats at [employer]  - 1 x daily - 1-2 x weekly - 3 sets - 10 reps - Plank at [employer]  - 1 x daily - 1-2 x weekly - 3 sets - 10 reps - Side to Side Hamstring Stretch with Noodle at [employer]  - 1 x daily - 1-2 x weekly - 3 sets - 10 reps - Lobbyist at [employer]  - 1 x daily - 1-2 x weekly - 3 sets - 10 reps - Squat  - 1 x daily - 1-2 x weekly - 3 sets - 10 reps - Heel Toe Raises at [employer]  - 1 x daily - 1-2 x weekly - 3 sets - 10 reps - Side Stepping  - 1 x daily - 1-2 x weekly - 3 sets - 10 reps - Forward Walking  - 1 x daily - 1-2 x weekly - 3 sets - 10 reps - Backward Walking  - 1 x daily - 1-2 x weekly - 3 sets - 10 reps - Standing Hip Circles at [employer]  - 1 x daily - 1-2 x weekly - 3 sets - 10 reps - Standing March at [facility]  - 1 x daily - 1-2 x weekly - 3 sets - 10 reps  GOALS: Goals reviewed with patient? Yes  SHORT TERM GOALS: Target date: 12/27/2023  Pt will decrease 5xSTS to </=19.72 seconds with improved immediate standing balance in order to demonstrate decreased risk for falls and improved functional bilateral LE strength and  power. Baseline:  24.72 seconds w/ light BUE support; 15.97 sec w/ BUE support (3/3) Goal status: MET  2.  Pt will demonstrate TUG of </=17.09 seconds in order to decrease risk of falls and improve functional mobility using LRAD. Baseline: 22.09 sec no AD SBA (2/3); 12.86 sec no AD SBA (3/3) Goal status: MET  3.  Pt will demonstrate a gait speed of >/=2.52 feet/sec in order to decrease risk for falls. Baseline: 2.32 ft/sec (2/3); 2.74 ft/sec (3/3) Goal status: MET  LONG TERM GOALS: Target date: 01/24/2024  Pt will be independent and compliant with strength, stretching, and balance focused land-based HEP and  aquatic HEP if continuing following discharge in order to maintain functional progress and improve mobility. Baseline:  IND/compliant per report (3/25) Goal status: MET  2.  Pt will decrease 5xSTS to </=14.72 seconds in order to demonstrate decreased risk for falls and improved functional bilateral LE strength and power. Baseline: 24.72 seconds w/ light BUE support; 16.47 sec w/ intermittent touch to initiate stand (3/25) Goal status: PARTIALLY MET  3.  Pt will demonstrate TUG of </=12.09 seconds in order to decrease risk of falls and improve functional mobility using LRAD. Baseline: 22.09 sec no AD SBA (2/3); 12.22 sec IND (3/25) Goal status: PARTIALLY MET  4.  Pt will demonstrate a gait speed of >/=2.92 feet/sec in order to decrease risk for falls. Baseline: 2.32 ft/sec (2/3); 2.74 ft/sec (3/3); 2.85 ft/sec (3/25) Goal status: PARTIALLY MET  5.  Pt will ambulate >/=800 feet at IND level over unlevel outdoor surfaces and grass to promote household and community access. Baseline: 1000 ft over unlevel and grass IND (3/25) Goal status: MET  6.  Pt will improve ABC Scale to >/= 69.4% in order to demonstrate improved fall risk and engagement in daily activities. Baseline: 59.4% (2/3); 98.94% (3/25) Goal status: MET  GOALS (re-cert 1/61) : Goals reviewed with patient? Yes  SHORT TERM GOALS: Target date: 02/14/2024  Pt will be independent and compliant with aquatic HEP in order to maintain functional progress and improve mobility. Baseline:  To be established. Goal status: ONGOING  2.  Pt will demonstrate a gait speed of >/=2.92 feet/sec in order to decrease risk for falls. Baseline: 2.32 ft/sec (2/3); 2.74 ft/sec (3/3); 2.85 ft/sec (3/25); 3.01 ft/sec (5/2) Goal status: MET  LONG TERM GOALS: Target date: 03/06/2024  1.  Pt will decrease 5xSTS to </=14.72 seconds in order to demonstrate decreased risk for falls and improved functional bilateral LE strength and power. Baseline: 24.72  seconds w/ light BUE support; 16.47 sec w/ intermittent touch to initiate stand (3/25); 14.87 sec no UE support (5/12) Goal status: PARTIALLY MET  2.  Pt will demonstrate TUG of </=10 seconds in order to decrease risk of falls and improve functional mobility using LRAD. Baseline: 22.09 sec no AD SBA (2/3); 12.22 sec IND (3/25); 10.75 sec IND (5/12) Goal status: PARTIALLY MET  3.  Pt will demonstrate a gait speed of >/=3.12 feet/sec in order to decrease risk for falls. Baseline: 2.32 ft/sec (2/3); 2.74 ft/sec (3/3); 2.85 ft/sec (3/25); 3.17 ft/sec (5/12) Goal status:  MET  GOALS (re-cert 0/96) : Goals reviewed with patient? Yes  SHORT TERM GOALS = LONG TERM GOALS: Target date: 04/10/2024  1.  Pt will decrease 5xSTS to </=14.72 seconds in order to demonstrate decreased risk for falls and improved functional bilateral LE strength and power. Baseline: 24.72 seconds w/ light BUE support; 16.47 sec w/ intermittent touch to initiate stand (3/25); 14.87 sec no  UE support (5/12) Goal status: PARTIALLY MET  2.  Pt will demonstrate TUG of </=10 seconds in order to decrease risk of falls and improve functional mobility using LRAD. Baseline: 22.09 sec no AD SBA (2/3); 12.22 sec IND (3/25); 10.75 sec IND (5/12) Goal status: PARTIALLY MET  3.  Pt will demonstrate a gait speed of >/=3.37 feet/sec in order to decrease risk for falls. Baseline: 2.32 ft/sec (2/3); 2.74 ft/sec (3/3); 2.85 ft/sec (3/25); 3.17 ft/sec (5/12)    Goal status: REVISED  ASSESSMENT:  CLINICAL IMPRESSION: Focus of skilled session today on LTG assessment in preparation for re-certification.  He has made continued progress with skilled PT in his general balance and LE strength.  His stiffness has shown marked improvement likely due to combination of medications and regular movement.  He remains motivated and active outside of clinic and his gait speed exceeded his goal at 3.17 ft/sec today compared to 2.85 ft/sec prior.  His TUG and  5xSTS were almost to goal level and his 5xSTS improved without UE assist.  Overall, he would benefit from short additional bout of care to further address hip, knee and low back flexibility to enable better management of his mobility at home at time of discharge.  Will continue per POC.  OBJECTIVE IMPAIRMENTS: decreased activity tolerance, decreased balance, decreased coordination, decreased endurance, decreased knowledge of condition, decreased mobility, difficulty walking, decreased strength, increased edema, and increased muscle spasms.   ACTIVITY LIMITATIONS: carrying, lifting, bending, sitting, standing, squatting, stairs, transfers, bed mobility, and locomotion level  PARTICIPATION LIMITATIONS: shopping, community activity, and yard work  PERSONAL FACTORS: Age, Fitness, and 1-2 comorbidities: HTN, ongoing S1 radiculopathy are also affecting patient's functional outcome.   REHAB POTENTIAL: Excellent  CLINICAL DECISION MAKING: Evolving/moderate complexity  EVALUATION COMPLEXITY: Moderate  PLAN:  PT FREQUENCY: 2x/week (1 land and 1 aquatic for initial 3 weeks of re-cert) + 1x/wk  PT DURATION: 8 weeks + 6 weeks (1/61 re-cert) + 4 wks (0/96 re-cert)  PLANNED INTERVENTIONS: 04540- PT Re-evaluation, 97110-Therapeutic exercises, 97530- Therapeutic activity, 97112- Neuromuscular re-education, 97535- Self Care, 98119- Manual therapy, [phone]- Gait training, [phone]- Orthotic Fit/training, [phone]- Aquatic Therapy, Patient/Family education, Balance training, Stair training, Taping, Dry Needling, Joint mobilization, Spinal mobilization, Vestibular training, DME instructions, Moist heat, Therapeutic exercises, Therapeutic activity, Neuromuscular re-education, Gait training, and Self Care  PLAN FOR NEXT SESSION: Modify land HEP for core and LE strength, stretching, and balance.  SciFit for reciprocal mobility.  Upper body strength. Hip flexor strength.  Hip IR/ER, lower trunk mobility, knee  mobility  Marilou Showman, PT, DPT 03/09/24 2:47 PM

## 2024-03-09 NOTE — Patient Instructions (Signed)
 Access Code: [email or web address] URL: [email or web address] Date: 03/09/2024 Prepared by: Marilou Showman  Exercises - Piriformis Stretch at [employer]  - 1 x daily - 1-2 x weekly - 3 sets - 10 reps - Standing 3-Way Kick with Ankle Float at [employer]  - 1 x daily - 1-2 x weekly - 3 sets - 10 reps - Hip Flexor Stretch at [employer]  - 1 x daily - 1-2 x weekly - 3 sets - 10 reps - Bilateral Shoulder Flexion Extension with Hand Floats at [employer]  - 1 x daily - 1-2 x weekly - 3 sets - 10 reps - Plank at [employer]  - 1 x daily - 1-2 x weekly - 3 sets - 10 reps - Side to Side Hamstring Stretch with Noodle at [employer]  - 1 x daily - 1-2 x weekly - 3 sets - 10 reps - Lobbyist at [employer]  - 1 x daily - 1-2 x weekly - 3 sets - 10 reps - Squat  - 1 x daily - 1-2 x weekly - 3 sets - 10 reps - Heel Toe Raises at [employer]  - 1 x daily - 1-2 x weekly - 3 sets - 10 reps - Side Stepping  - 1 x daily - 1-2 x weekly - 3 sets - 10 reps - Forward Walking  - 1 x daily - 1-2 x weekly - 3 sets - 10 reps - Backward Walking  - 1 x daily - 1-2 x weekly - 3 sets - 10 reps - Standing Hip Circles at [employer]  - 1 x daily - 1-2 x weekly - 3 sets - 10 reps - Standing March at [facility]  - 1 x daily - 1-2 x weekly - 3 sets - 10 reps

## 2024-03-13 DIAGNOSIS — G2582 Stiff-man syndrome: Secondary | ICD-10-CM | POA: Diagnosis not present

## 2024-03-16 ENCOUNTER — Ambulatory Visit: Admitting: Physical Therapy

## 2024-03-16 ENCOUNTER — Encounter: Payer: Self-pay | Admitting: Physical Therapy

## 2024-03-16 DIAGNOSIS — M25662 Stiffness of left knee, not elsewhere classified: Secondary | ICD-10-CM | POA: Diagnosis not present

## 2024-03-16 DIAGNOSIS — M25672 Stiffness of left ankle, not elsewhere classified: Secondary | ICD-10-CM

## 2024-03-16 DIAGNOSIS — R252 Cramp and spasm: Secondary | ICD-10-CM | POA: Diagnosis not present

## 2024-03-16 DIAGNOSIS — M25652 Stiffness of left hip, not elsewhere classified: Secondary | ICD-10-CM

## 2024-03-16 DIAGNOSIS — R2681 Unsteadiness on feet: Secondary | ICD-10-CM | POA: Diagnosis not present

## 2024-03-16 DIAGNOSIS — R2689 Other abnormalities of gait and mobility: Secondary | ICD-10-CM | POA: Diagnosis not present

## 2024-03-16 DIAGNOSIS — M25675 Stiffness of left foot, not elsewhere classified: Secondary | ICD-10-CM | POA: Diagnosis not present

## 2024-03-16 DIAGNOSIS — R600 Localized edema: Secondary | ICD-10-CM

## 2024-03-16 DIAGNOSIS — M6281 Muscle weakness (generalized): Secondary | ICD-10-CM

## 2024-03-16 DIAGNOSIS — Z9181 History of falling: Secondary | ICD-10-CM

## 2024-03-16 NOTE — Therapy (Signed)
 OUTPATIENT PHYSICAL THERAPY NEURO TREATMENT   Patient Name: Michael [name] MRN: [id number] DOB:1956-06-17, 68 y.o., male Today's Date: 03/16/2024   PCP: Arcadio Knuckles, MD REFERRING PROVIDER: Persons, Norma Beckers, [place]   END OF SESSION:  PT End of Session - 03/16/24 1325     Visit Number 23    Number of Visits 33   29 + 4   Date for PT Re-Evaluation 04/17/24   pushed out due to possible scheduling delay   Authorization Type [employer] MEDICARE    Progress Note Due on Visit 10    PT Start Time 1315    PT Stop Time 1400    PT Time Calculation (min) 45 min    Equipment Utilized During Treatment Gait belt    Activity Tolerance Patient tolerated treatment well    Behavior During Therapy WFL for tasks assessed/performed             Past Medical History:  Diagnosis Date   Hyperlipidemia    Hypertension    Lumbar disc disease    Prostate cancer (HCC)    Past Surgical History:  Procedure Laterality Date   LUMBAR DISC SURGERY  2014   LUMBAR DISC SURGERY     right ankle fracture  1978   external pinning   Patient Active Problem List   Diagnosis Date Noted   Anemia due to zinc  deficiency 01/28/2024   Deficiency anemia 01/22/2024   Bradycardia, sinus 01/22/2024   Screening for colon cancer 01/22/2024   Need for prophylactic vaccination with combined diphtheria-tetanus-pertussis (DTP) vaccine 01/22/2024   Frequent unifocal PVCs 01/22/2024   MGUS (monoclonal gammopathy of unknown significance) 06/10/2023   RBC microcytosis 06/10/2023   DDD (degenerative disc disease), lumbar 12/21/2022   Prostate cancer (HCC); T1c; GG 1; initial diagnosis 2011 12/05/2022   Chronic hyperglycemia 11/16/2022   Dyslipidemia, goal LDL below 70 09/23/2022   Elevated PSA, between 10 and less than 20 ng/ml 09/17/2022   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   Vitamin D  deficiency 09/07/2021    ONSET DATE: symptoms began in 2020; pt had a fall August 23, 2023  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  He is wearing his lucky shirt today which he wore a lot when he first bought his house.  He requests to show PT pictures of this.     Pt accompanied by: self  PERTINENT HISTORY: degenerative disc disease (followed by [facility]) s/p L5-S1 surgery, HLD, HTN, prostate cancer  Patient's symptoms started in 2020 - right flank pain (reported initially as abdominal pain, but pt clarifies this is not a deep cramp/stomach pain, but a muscular soreness especially with movement), core weakness (not able to sit up well), sneezing and coughing was painful as well.  A year later he noted stiffness in his legs after sitting where he has to walk up his legs with his hands.  Now, he reports numbness and soreness in left hamstring area as well as swelling and muscle spasms (left worse).  In order to walk he has to weight shift and bring feet into narrowed BOS then start with small steps.  He loosens up with movement. He reports freezing when startled. Ambulates w/ SPC.  Upper body remains WNL.  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  Pt has a fall August 23, 2023 halting his prior PT episode due  to broken ribs.  PAIN:  Are you having pain? No  PRECAUTIONS: Fall  RED FLAGS: None-pt does report more urinary urgency that he reports corresponds with the reported itch in his legs and low back, but he has discussed this with his physician (Dr. Genita Keys)  WEIGHT BEARING RESTRICTIONS: No  FALLS: Has patient fallen in last 6 months? Yes. 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  He modifies some homemaking tasks, but reports he can stand longer to do things like cooking.  PATIENT GOALS: Work on upper body strength  OBJECTIVE:   DIAGNOSTIC FINDINGS:  EMG on 05/14/23 showed an active left S1 radiculopathy (similar to prior EMG)  X-ray Bone Survey Met (11/06/2023):   IMPRESSION: No evidence of lytic or destructive bone lesion  X-ray Ribs Unilateral Right (09/04/2023): Radiographs of his right ribs demonstrate no more blunting of the  costophrenic angle he does have vascular markings going out to the  periphery.   COGNITION: Overall cognitive status: Within functional limits for tasks assessed and pt hyperverbal   SENSATION: Light touch: WFL  COORDINATION: LE RAMS:  slow and deliberate Bilateral Heel-to-shin:  limited by lack of bilateral ER  EDEMA:  bilateral ankle edema (baseline per pt - he has been using castor oil and lavender to improve skin integrity - MD aware of regimen)  MUSCLE TONE: None noted in BLE  POSTURE: weight shift right  LOWER EXTREMITY ROM:     Active  Right Eval Left Eval  Hip  flexion Grossly WFL - limited bilateral ER (basically at neutral  Hip extension   Hip abduction   Hip adduction   Hip internal rotation   Hip external rotation   Knee flexion  Knee extension   Ankle dorsiflexion   Ankle plantarflexion    Ankle inversion    Ankle eversion     (Blank rows = not tested)  LOWER EXTREMITY MMT:    MMT Right Eval Left Eval  Hip flexion 3/5 3+/5  Hip extension    Hip abduction    Hip adduction    Hip internal rotation    Hip external rotation    Knee flexion    Knee extension 4+/5 4+/5  Ankle dorsiflexion 3+/5 4/5  Ankle plantarflexion    Ankle inversion    Ankle eversion    (Blank rows = not tested)  BED MOBILITY:  Pt reports his bed mobility has greatly improved and he has switched his setup so he enters and exits bed on side closest to his bathroom  TRANSFERS: Assistive device utilized: None  Sit to stand: Modified independence - uses hands Stand to sit: Modified independence Chair to chair: Complete Independence Floor: pt performed modI on most recent fall  GAIT: Gait pattern: step through pattern, decreased step length- Right, decreased step length- Left, decreased stride length, decreased hip/knee flexion- Right, decreased hip/knee flexion- Left, lateral lean- Right, decreased trunk rotation, and narrow BOS Distance walked: various clinic distances Assistive device utilized: None Level of assistance: SBA Comments: No notable ataxia or involuntary movements during ambulation into/out of clinic.  FUNCTIONAL TESTS:  5 times sit to stand: 24.72 seconds w/ light BUE support - wide stance with some crouching into tall stand due to imbalance Timed up and go (TUG): To be assessed. 10 meter walk test: To be assessed.  PATIENT SURVEYS:  ABC scale To be assessed.  TODAY'S TREATMENT:    03/16/24                                                                                                                          -SciFit in ramp mode x10  minutes up to level 7.0 for dynamic cardiovascular warmup and neural priming to improve ambulatory mechanics secondary to stiffness.  Pt able to hold winded conversation intermittently throughout maintaining pace of 100-130 steps/min.  -Seated side-bending stretch w/ overhead squigz placement x10 each side > pulled off mirror; more tightness in right side -Hook-lying IR/ER stretch 2x20 > supine 4" up and overs 2x10 each LE -Alternating 8" seated toe taps, he needs fingertip support for core stability x20 -Standing hip openers w/ uni UE support x12 each side  PATIENT EDUCATION: Education details: Aquatic HEP - attempt when he has access to a pool.  Continue HEP w/ additions and chair yoga.  Continue walking program.  Progress towards goals.  Can add side-bending stretch to routine as desired.  Discussed monitoring BP as he had a recent elevated BP and inquires about best time to check his BP at home. Person educated: Patient Education method: Explanation Education comprehension: verbalized understanding and needs further education  HOME EXERCISE PROGRAM: Review from episode prior - modify as needed:  Access Code: [email or web address]  You Can Walk For A Certain Length Of Time Each Day (can start with 3-4 days and work up to everyday - use cane for safety and walk in well lit familiar areas with smooth surfaces)                          Walk 5 minutes 2 times per day.             Increase 2  minutes every 7 days              Work up to 20 minutes (1x per day).               Example:                         Day 1-2           4-5 minutes     3 times per day                         Day 7-8           10-12 minutes 2-3 times per day                         Day 13-14       20-22 minutes 1-2 times per day  Chair yoga poses 1-6, 8-9 (modified position 9 and held x10 seconds), 12  Access Code: [email or web address] URL: [email or web address] Date: 03/09/2024 Prepared by: Marilou Showman  Exercises -  Piriformis Stretch at [employer]  - 1 x daily - 1-2 x weekly - 3 sets - 10 reps - Standing 3-Way Kick with Ankle Float at [employer]  - 1 x daily - 1-2 x weekly - 3 sets - 10 reps - Hip Flexor Stretch at [employer]  - 1 x daily - 1-2 x weekly - 3 sets - 10 reps - Bilateral Shoulder Flexion Extension with Hand Floats at [employer]  - 1 x daily - 1-2 x weekly - 3 sets - 10 reps - Plank at [employer]  - 1 x daily - 1-2 x weekly - 3 sets - 10 reps - Side to Side Hamstring Stretch with Noodle at [employer]  - 1 x daily - 1-2 x weekly - 3 sets - 10 reps - Lobbyist at [employer]  - 1 x daily - 1-2 x weekly - 3 sets - 10 reps - Squat  - 1 x daily - 1-2 x weekly - 3 sets - 10 reps - Heel Toe Raises at Pool Wall  - 1 x daily - 1-2 x weekly - 3 sets - 10 reps - Side Stepping  - 1 x daily - 1-2 x weekly - 3 sets - 10 reps - Forward Walking  - 1 x daily - 1-2 x weekly - 3 sets - 10 reps - Backward Walking  - 1 x daily - 1-2 x weekly - 3 sets - 10 reps - Standing Hip Circles at [employer]  - 1 x daily - 1-2 x weekly - 3 sets - 10 reps - Standing March at [facility]  - 1 x daily - 1-2 x weekly - 3 sets - 10 reps -Side-bending stretch  GOALS: Goals reviewed with patient? Yes  SHORT TERM GOALS: Target date: 12/27/2023  Pt will decrease 5xSTS to </=19.72 seconds with  improved immediate standing balance in order to demonstrate decreased risk for falls and improved functional bilateral LE strength and power. Baseline:  24.72 seconds w/ light BUE support; 15.97 sec w/ BUE support (3/3) Goal status: MET  2.  Pt will demonstrate TUG of </=17.09 seconds in order to decrease risk of falls and improve functional mobility using LRAD. Baseline: 22.09 sec no AD SBA (2/3); 12.86 sec no AD SBA (3/3) Goal status: MET  3.  Pt will demonstrate a gait speed of >/=2.52 feet/sec in order to decrease risk for falls. Baseline: 2.32 ft/sec (2/3); 2.74 ft/sec (3/3) Goal status: MET  LONG TERM GOALS: Target date: 01/24/2024  Pt  will be independent and compliant with strength, stretching, and balance focused land-based HEP and aquatic HEP if continuing following discharge in order to maintain functional progress and improve mobility. Baseline:  IND/compliant per report (3/25) Goal status: MET  2.  Pt will decrease 5xSTS to </=14.72 seconds in order to demonstrate decreased risk for falls and improved functional bilateral LE strength and power. Baseline: 24.72 seconds w/ light BUE support; 16.47 sec w/ intermittent touch to initiate stand (3/25) Goal status: PARTIALLY MET  3.  Pt will demonstrate TUG of </=12.09 seconds in order to decrease risk of falls and improve functional mobility using LRAD. Baseline: 22.09 sec no AD SBA (2/3); 12.22 sec IND (3/25) Goal status: PARTIALLY MET  4.  Pt will demonstrate a gait speed of >/=2.92 feet/sec in order to decrease risk for falls. Baseline: 2.32 ft/sec (2/3); 2.74 ft/sec (3/3); 2.85 ft/sec (3/25) Goal status: PARTIALLY MET  5.  Pt will ambulate >/=800 feet at IND level over unlevel outdoor surfaces and grass to promote household and community access. Baseline: 1000 ft over unlevel and grass IND (3/25) Goal status: MET  6.  Pt will improve ABC Scale to >/= 69.4% in order to demonstrate improved fall risk and engagement in daily activities. Baseline: 59.4% (2/3); 98.94% (3/25) Goal status: MET  GOALS (re-cert 1/61) : Goals reviewed with patient? Yes  SHORT TERM GOALS: Target date: 02/14/2024  Pt will be independent and compliant with aquatic HEP in order to maintain functional progress and improve mobility. Baseline:  To be established. Goal status: ONGOING  2.  Pt will demonstrate a gait speed of >/=2.92 feet/sec in order to decrease risk for falls. Baseline: 2.32 ft/sec (2/3); 2.74 ft/sec (3/3); 2.85 ft/sec (3/25); 3.01 ft/sec (5/2) Goal status: MET  LONG TERM GOALS: Target date: 03/06/2024  1.  Pt will decrease 5xSTS to </=14.72 seconds in order to demonstrate  decreased risk for falls and improved functional bilateral LE strength and power. Baseline: 24.72 seconds w/ light BUE support; 16.47 sec w/ intermittent touch to initiate stand (3/25); 14.87 sec no UE support (5/12) Goal status: PARTIALLY MET  2.  Pt will demonstrate TUG of </=10 seconds in order to decrease risk of falls and improve functional mobility using LRAD. Baseline: 22.09 sec no AD SBA (2/3); 12.22 sec IND (3/25); 10.75 sec IND (5/12) Goal status: PARTIALLY MET  3.  Pt will demonstrate a gait speed of >/=3.12 feet/sec in order to decrease risk for falls. Baseline: 2.32 ft/sec (2/3); 2.74 ft/sec (3/3); 2.85 ft/sec (3/25); 3.17 ft/sec (5/12) Goal status:  MET  GOALS (re-cert 0/96) : Goals reviewed with patient? Yes  SHORT TERM GOALS = LONG TERM GOALS: Target date: 04/10/2024  1.  Pt will decrease 5xSTS to </=14.72 seconds in order to demonstrate decreased risk for falls and improved functional bilateral LE strength and power.  Baseline: 24.72 seconds w/ light BUE support; 16.47 sec w/ intermittent touch to initiate stand (3/25); 14.87 sec no UE support (5/12) Goal status: PARTIALLY MET  2.  Pt will demonstrate TUG of </=10 seconds in order to decrease risk of falls and improve functional mobility using LRAD. Baseline: 22.09 sec no AD SBA (2/3); 12.22 sec IND (3/25); 10.75 sec IND (5/12) Goal status: PARTIALLY MET  3.  Pt will demonstrate a gait speed of >/=3.37 feet/sec in order to decrease risk for falls. Baseline: 2.32 ft/sec (2/3); 2.74 ft/sec (3/3); 2.85 ft/sec (3/25); 3.17 ft/sec (5/12)    Goal status: REVISED  ASSESSMENT:  CLINICAL IMPRESSION: Focus of skilled session today on address lower body and lower trunk mobility and improved flexibility.  He has QL and IR/ER tightness prominent throughout exercises today.  He was instructed to utilize his aquatic HEP when he is able to gain access to a pool environment to assist with maintaining looseness of joints.  He continues  to benefit from skilled PT to improve general mobility and high level safe independence.  OBJECTIVE IMPAIRMENTS: decreased activity tolerance, decreased balance, decreased coordination, decreased endurance, decreased knowledge of condition, decreased mobility, difficulty walking, decreased strength, increased edema, and increased muscle spasms.   ACTIVITY LIMITATIONS: carrying, lifting, bending, sitting, standing, squatting, stairs, transfers, bed mobility, and locomotion level  PARTICIPATION LIMITATIONS: shopping, community activity, and yard work  PERSONAL FACTORS: Age, Fitness, and 1-2 comorbidities: HTN, ongoing S1 radiculopathy are also affecting patient's functional outcome.   REHAB POTENTIAL: Excellent  CLINICAL DECISION MAKING: Evolving/moderate complexity  EVALUATION COMPLEXITY: Moderate  PLAN:  PT FREQUENCY: 2x/week (1 land and 1 aquatic for initial 3 weeks of re-cert) + 1x/wk  PT DURATION: 8 weeks + 6 weeks (8/65 re-cert) + 4 wks (7/84 re-cert)  PLANNED INTERVENTIONS: [id number]- PT Re-evaluation, 97110-Therapeutic exercises, 97530- Therapeutic activity, 97112- Neuromuscular re-education, 97535- Self Care, [id number]- Manual therapy, [phone]- Gait training, [phone]- Orthotic Fit/training, [phone]- Aquatic Therapy, Patient/Family education, Balance training, Stair training, Taping, Dry Needling, Joint mobilization, Spinal mobilization, Vestibular training, DME instructions, Moist heat, Therapeutic exercises, Therapeutic activity, Neuromuscular re-education, Gait training, and Self Care  PLAN FOR NEXT SESSION: Modify land HEP for core and LE strength, stretching, and balance.  SciFit for reciprocal mobility.  Upper body strength. Hip flexor strength.  Hip IR/ER, lower trunk mobility, knee mobility  Marilou Showman, PT, DPT 03/16/24 2:01 PM

## 2024-03-27 ENCOUNTER — Ambulatory Visit: Admitting: Physical Therapy

## 2024-03-27 ENCOUNTER — Encounter: Payer: Self-pay | Admitting: Physical Therapy

## 2024-03-27 DIAGNOSIS — Z9181 History of falling: Secondary | ICD-10-CM | POA: Diagnosis not present

## 2024-03-27 DIAGNOSIS — R2681 Unsteadiness on feet: Secondary | ICD-10-CM | POA: Diagnosis not present

## 2024-03-27 DIAGNOSIS — R600 Localized edema: Secondary | ICD-10-CM

## 2024-03-27 DIAGNOSIS — M25672 Stiffness of left ankle, not elsewhere classified: Secondary | ICD-10-CM

## 2024-03-27 DIAGNOSIS — M25652 Stiffness of left hip, not elsewhere classified: Secondary | ICD-10-CM

## 2024-03-27 DIAGNOSIS — M6281 Muscle weakness (generalized): Secondary | ICD-10-CM

## 2024-03-27 DIAGNOSIS — R2689 Other abnormalities of gait and mobility: Secondary | ICD-10-CM | POA: Diagnosis not present

## 2024-03-27 DIAGNOSIS — M25662 Stiffness of left knee, not elsewhere classified: Secondary | ICD-10-CM

## 2024-03-27 DIAGNOSIS — M25675 Stiffness of left foot, not elsewhere classified: Secondary | ICD-10-CM

## 2024-03-27 DIAGNOSIS — R252 Cramp and spasm: Secondary | ICD-10-CM | POA: Diagnosis not present

## 2024-03-27 NOTE — Therapy (Signed)
 OUTPATIENT PHYSICAL THERAPY NEURO TREATMENT   Patient Name: Michael [name] MRN: [id number] DOB:[birth year], 68 y.o., male Today's Date: 03/27/2024   PCP: Arcadio Knuckles, MD REFERRING PROVIDER: Persons, Norma Beckers, [place]   END OF SESSION:  PT End of Session - 03/27/24 1538     Visit Number 24    Number of Visits 33   29 + 4   Date for PT Re-Evaluation 04/17/24   pushed out due to possible scheduling delay   Authorization Type [employer] MEDICARE    Progress Note Due on Visit 10    PT Start Time 1533    PT Stop Time 1613    PT Time Calculation (min) 40 min    Equipment Utilized During Treatment Gait belt    Activity Tolerance Patient tolerated treatment well    Behavior During Therapy WFL for tasks assessed/performed             Past Medical History:  Diagnosis Date   Hyperlipidemia    Hypertension    Lumbar disc disease    Prostate cancer (HCC)    Past Surgical History:  Procedure Laterality Date   LUMBAR DISC SURGERY  2014   LUMBAR DISC SURGERY     right ankle fracture  1978   external pinning   Patient Active Problem List   Diagnosis Date Noted   Anemia due to zinc  deficiency 01/28/2024   Deficiency anemia 01/22/2024   Bradycardia, sinus 01/22/2024   Screening for colon cancer 01/22/2024   Need for prophylactic vaccination with combined diphtheria-tetanus-pertussis (DTP) vaccine 01/22/2024   Frequent unifocal PVCs 01/22/2024   MGUS (monoclonal gammopathy of unknown significance) 06/10/2023   RBC microcytosis 06/10/2023   DDD (degenerative disc disease), lumbar 12/21/2022   Prostate cancer (HCC); T1c; GG 1; initial diagnosis 2011 12/05/2022   Chronic hyperglycemia 11/16/2022   Dyslipidemia, goal LDL below 70 09/23/2022   Elevated PSA, between 10 and less than 20 ng/ml 09/17/2022   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   Vitamin D  deficiency 09/07/2021    ONSET DATE: symptoms began in 2020; pt had a fall August 23, 2023  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  He has had some recent visual changes that have resolved back to baseline.  No other acute changes.  He inquires about systems effects related to his autoimmune condition.  Pt accompanied by: self  PERTINENT HISTORY: degenerative disc disease (followed by [facility]) s/p L5-S1 surgery, HLD, HTN, prostate cancer  Patient's symptoms started in 2020 - right flank pain (reported initially as abdominal pain, but pt clarifies this is not a deep cramp/stomach pain, but a muscular soreness especially with movement), core weakness (not able to sit up well), sneezing and coughing was painful as well.  A year later he noted stiffness in his legs after sitting where he has to walk up his legs with his hands.  Now, he reports numbness and soreness in left hamstring area as well as swelling and muscle spasms (left worse).  In order to walk he has to weight shift and bring feet into narrowed BOS then start with small steps.  He loosens up with movement. He reports freezing when startled. Ambulates w/ SPC.  Upper body remains WNL.  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  Pt has a fall August 23, 2023 halting his  prior PT episode due to broken ribs.  PAIN:  Are you having pain? No  PRECAUTIONS: Fall  RED FLAGS: None-pt does report more urinary urgency that he reports corresponds with the reported itch in his legs and low back, but he has discussed this with his physician (Dr. Genita Keys)  WEIGHT BEARING RESTRICTIONS: No  FALLS: Has patient fallen in last 6 months? Yes. 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  He modifies some homemaking tasks, but reports he can stand longer to do things like cooking.  PATIENT GOALS: Work on upper body strength  OBJECTIVE:   DIAGNOSTIC FINDINGS:  EMG on 05/14/23 showed an active left S1 radiculopathy (similar to prior EMG)  X-ray Bone Survey Met (11/06/2023):   IMPRESSION: No evidence of lytic or destructive bone lesion  X-ray Ribs Unilateral Right (09/04/2023): Radiographs of his right ribs demonstrate no more blunting of the  costophrenic angle he does have vascular markings going out to the  periphery.   COGNITION: Overall cognitive status: Within functional limits for tasks assessed and pt hyperverbal   SENSATION: Light touch: WFL  COORDINATION: LE RAMS:  slow and deliberate Bilateral Heel-to-shin:  limited by lack of bilateral ER  EDEMA:  bilateral ankle edema (baseline per pt - he has been using castor oil and lavender to improve skin integrity - MD aware of regimen)  MUSCLE TONE: None noted in BLE  POSTURE: weight shift right  LOWER EXTREMITY ROM:     Active   Right Eval Left Eval  Hip flexion Grossly WFL - limited bilateral ER (basically at neutral  Hip extension   Hip abduction   Hip adduction   Hip internal rotation   Hip external rotation   Knee flexion  Knee extension   Ankle dorsiflexion   Ankle plantarflexion    Ankle inversion    Ankle eversion     (Blank rows = not tested)  LOWER EXTREMITY MMT:    MMT Right Eval Left Eval  Hip flexion 3/5 3+/5  Hip extension    Hip abduction    Hip adduction    Hip internal rotation    Hip external rotation    Knee flexion    Knee extension 4+/5 4+/5  Ankle dorsiflexion 3+/5 4/5  Ankle plantarflexion    Ankle inversion    Ankle eversion    (Blank rows = not tested)  BED MOBILITY:  Pt reports his bed mobility has greatly improved and he has switched his setup so he enters and exits bed on side closest to his bathroom  TRANSFERS: Assistive device utilized: None  Sit to stand: Modified independence - uses hands Stand to sit: Modified independence Chair to chair: Complete Independence Floor: pt performed modI on most recent fall  GAIT: Gait pattern: step through pattern, decreased step length- Right, decreased step length- Left, decreased stride length, decreased hip/knee flexion- Right, decreased hip/knee flexion- Left, lateral lean- Right, decreased trunk rotation, and narrow BOS Distance walked: various clinic distances Assistive device utilized: None Level of assistance: SBA Comments: No notable ataxia or involuntary movements during ambulation into/out of clinic.  FUNCTIONAL TESTS:  5 times sit to stand: 24.72 seconds w/ light BUE support - wide stance with some crouching into tall stand due to imbalance Timed up and go (TUG): To be assessed. 10 meter walk test: To be assessed.  PATIENT SURVEYS:  ABC scale To be assessed.  TODAY'S TREATMENT:    03/27/24                                                                                                                           -Deferred system specific information to neurologist.  Provided therapeutic listening and encouragement regarding this and ongoing symptoms. -Side-lying quad stretch several short reps each side with pt fighting "catch" into stretch, cued for slow movement to not provoke spasms as much -Supine hanging hip flexion x15 each LE, more left hip tightness initially -Hook-lying IR/ER stretch x20 -Standing hip openers and closers x12 each side -Standing supported Y balance x8 each LE  PATIENT EDUCATION: Education details: Aquatic HEP - attempt when he has access to a pool.  Continue HEP w/ additions and chair yoga.  Continue walking program.  Can add side-bending stretch to routine as desired.   Person educated: Patient Education method: Explanation Education comprehension: verbalized understanding and needs further education  HOME EXERCISE PROGRAM: Review from episode prior - modify as needed:  Access Code: [email or web address]  You Can Walk For A Certain Length Of Time Each Day (can start with 3-4 days and work up to everyday - use cane for safety and walk in well lit familiar areas with smooth surfaces)  Walk 5 minutes 2 times per day.             Increase 2  minutes every 7 days              Work up to 20 minutes (1x per day).               Example:                         Day 1-2           4-5 minutes     3 times per day                         Day 7-8           10-12 minutes 2-3 times per day                         Day 13-14       20-22 minutes 1-2 times per day  Chair yoga poses 1-6, 8-9 (modified position 9 and held x10 seconds), 12  Access Code: [email or web address] URL: [email or web address] Date: 03/09/2024 Prepared by: Marilou Showman  Exercises - Piriformis Stretch at [employer]  - 1 x daily - 1-2 x weekly - 3 sets - 10 reps - Standing 3-Way Kick with Ankle Float at [employer]  - 1 x daily - 1-2 x weekly - 3 sets - 10 reps - Hip Flexor Stretch at [employer]   - 1 x daily - 1-2 x weekly - 3 sets - 10 reps - Bilateral Shoulder Flexion Extension with Hand Floats at [employer]  - 1 x daily - 1-2 x weekly - 3 sets - 10 reps - Plank at [employer]  - 1 x daily - 1-2 x weekly - 3 sets - 10 reps - Side to Side Hamstring Stretch with Noodle at [employer]  - 1 x daily - 1-2 x weekly - 3 sets - 10 reps - Lobbyist at [employer]  - 1 x daily - 1-2 x weekly - 3 sets - 10 reps - Squat  - 1 x daily - 1-2 x weekly - 3 sets - 10 reps - Heel Toe Raises at Pool Wall  - 1 x daily - 1-2 x weekly - 3 sets - 10 reps - Side Stepping  - 1 x daily - 1-2 x weekly - 3 sets - 10 reps - Forward Walking  - 1 x daily - 1-2 x weekly - 3 sets - 10 reps - Backward Walking  - 1 x daily - 1-2 x weekly - 3 sets - 10 reps - Standing Hip Circles at [employer]  - 1 x daily - 1-2 x weekly - 3 sets - 10 reps - Standing March at [facility]  - 1 x daily - 1-2 x weekly - 3 sets - 10 reps -Side-bending stretch  GOALS: Goals reviewed with patient? Yes  SHORT TERM GOALS: Target date: 12/27/2023  Pt will decrease 5xSTS to </=19.72 seconds with improved immediate standing balance in order to demonstrate decreased risk for falls and improved functional bilateral LE strength and power. Baseline:  24.72 seconds w/ light BUE support; 15.97 sec w/ BUE support (3/3) Goal status: MET  2.  Pt will demonstrate TUG of </=17.09 seconds in order to decrease risk of falls and improve functional mobility using LRAD. Baseline:  22.09 sec no AD SBA (2/3); 12.86 sec no AD SBA (3/3) Goal status: MET  3.  Pt will demonstrate a gait speed of >/=2.52 feet/sec in order to decrease risk for falls. Baseline: 2.32 ft/sec (2/3); 2.74 ft/sec (3/3) Goal status: MET  LONG TERM GOALS: Target date: 01/24/2024  Pt will be independent and compliant with strength, stretching, and balance focused land-based HEP and aquatic HEP if continuing following discharge in order to maintain functional progress and improve  mobility. Baseline:  IND/compliant per report (3/25) Goal status: MET  2.  Pt will decrease 5xSTS to </=14.72 seconds in order to demonstrate decreased risk for falls and improved functional bilateral LE strength and power. Baseline: 24.72 seconds w/ light BUE support; 16.47 sec w/ intermittent touch to initiate stand (3/25) Goal status: PARTIALLY MET  3.  Pt will demonstrate TUG of </=12.09 seconds in order to decrease risk of falls and improve functional mobility using LRAD. Baseline: 22.09 sec no AD SBA (2/3); 12.22 sec IND (3/25) Goal status: PARTIALLY MET  4.  Pt will demonstrate a gait speed of >/=2.92 feet/sec in order to decrease risk for falls. Baseline: 2.32 ft/sec (2/3); 2.74 ft/sec (3/3); 2.85 ft/sec (3/25) Goal status: PARTIALLY MET  5.  Pt will ambulate >/=800 feet at IND level over unlevel outdoor surfaces and grass to promote household and community access. Baseline: 1000 ft over unlevel and grass IND (3/25) Goal status: MET  6.  Pt will improve ABC Scale to >/= 69.4% in order to demonstrate improved fall risk and engagement in daily activities. Baseline: 59.4% (2/3); 98.94% (3/25) Goal status: MET  GOALS (re-cert 1/61) : Goals reviewed with patient? Yes  SHORT TERM GOALS: Target date: 02/14/2024  Pt will be independent and compliant with aquatic HEP in order to maintain functional progress and improve mobility. Baseline:  To be established. Goal status: ONGOING  2.  Pt will demonstrate a gait speed of >/=2.92 feet/sec in order to decrease risk for falls. Baseline: 2.32 ft/sec (2/3); 2.74 ft/sec (3/3); 2.85 ft/sec (3/25); 3.01 ft/sec (5/2) Goal status: MET  LONG TERM GOALS: Target date: 03/06/2024  1.  Pt will decrease 5xSTS to </=14.72 seconds in order to demonstrate decreased risk for falls and improved functional bilateral LE strength and power. Baseline: 24.72 seconds w/ light BUE support; 16.47 sec w/ intermittent touch to initiate stand (3/25); 14.87 sec no  UE support (5/12) Goal status: PARTIALLY MET  2.  Pt will demonstrate TUG of </=10 seconds in order to decrease risk of falls and improve functional mobility using LRAD. Baseline: 22.09 sec no AD SBA (2/3); 12.22 sec IND (3/25); 10.75 sec IND (5/12) Goal status: PARTIALLY MET  3.  Pt will demonstrate a gait speed of >/=3.12 feet/sec in order to decrease risk for falls. Baseline: 2.32 ft/sec (2/3); 2.74 ft/sec (3/3); 2.85 ft/sec (3/25); 3.17 ft/sec (5/12) Goal status:  MET  GOALS (re-cert 0/96) : Goals reviewed with patient? Yes  SHORT TERM GOALS = LONG TERM GOALS: Target date: 04/10/2024  1.  Pt will decrease 5xSTS to </=14.72 seconds in order to demonstrate decreased risk for falls and improved functional bilateral LE strength and power. Baseline: 24.72 seconds w/ light BUE support; 16.47 sec w/ intermittent touch to initiate stand (3/25); 14.87 sec no UE support (5/12) Goal status: PARTIALLY MET  2.  Pt will demonstrate TUG of </=10 seconds in order to decrease risk of falls and improve functional mobility using LRAD. Baseline: 22.09 sec no AD SBA (2/3); 12.22 sec IND (3/25); 10.75  sec IND (5/12) Goal status: PARTIALLY MET  3.  Pt will demonstrate a gait speed of >/=3.37 feet/sec in order to decrease risk for falls. Baseline: 2.32 ft/sec (2/3); 2.74 ft/sec (3/3); 2.85 ft/sec (3/25); 3.17 ft/sec (5/12)    Goal status: REVISED  ASSESSMENT:  CLINICAL IMPRESSION: Continued to progress hip and knee mobility and stability this visit.  He has better performance of hanging hip flexion than prior attempts.  His left hip remains tighter than his right.  He continues to benefit from skilled PT to address functional mobility deficits to maintain high level of independence.  Will continue per POC.  OBJECTIVE IMPAIRMENTS: decreased activity tolerance, decreased balance, decreased coordination, decreased endurance, decreased knowledge of condition, decreased mobility, difficulty walking, decreased  strength, increased edema, and increased muscle spasms.   ACTIVITY LIMITATIONS: carrying, lifting, bending, sitting, standing, squatting, stairs, transfers, bed mobility, and locomotion level  PARTICIPATION LIMITATIONS: shopping, community activity, and yard work  PERSONAL FACTORS: Age, Fitness, and 1-2 comorbidities: HTN, ongoing S1 radiculopathy are also affecting patient's functional outcome.   REHAB POTENTIAL: Excellent  CLINICAL DECISION MAKING: Evolving/moderate complexity  EVALUATION COMPLEXITY: Moderate  PLAN:  PT FREQUENCY: 2x/week (1 land and 1 aquatic for initial 3 weeks of re-cert) + 1x/wk  PT DURATION: 8 weeks + 6 weeks (9/62 re-cert) + 4 wks (9/52 re-cert)  PLANNED INTERVENTIONS: 84132- PT Re-evaluation, 97110-Therapeutic exercises, 97530- Therapeutic activity, 97112- Neuromuscular re-education, 97535- Self Care, 44010- Manual therapy, [phone]- Gait training, [phone]- Orthotic Fit/training, [phone]- Aquatic Therapy, Patient/Family education, Balance training, Stair training, Taping, Dry Needling, Joint mobilization, Spinal mobilization, Vestibular training, DME instructions, Moist heat, Therapeutic exercises, Therapeutic activity, Neuromuscular re-education, Gait training, and Self Care  PLAN FOR NEXT SESSION: Modify land HEP for core and LE strength, stretching, and balance.  SciFit for reciprocal mobility.  Upper body strength. Hip flexor strength.  Hip IR/ER, lower trunk mobility, knee mobility  Marilou Showman, PT, DPT 03/27/24 4:28 PM

## 2024-03-30 ENCOUNTER — Ambulatory Visit: Attending: Physician Assistant | Admitting: Physical Therapy

## 2024-03-30 ENCOUNTER — Encounter: Payer: Self-pay | Admitting: Physical Therapy

## 2024-03-30 DIAGNOSIS — Z9181 History of falling: Secondary | ICD-10-CM | POA: Diagnosis not present

## 2024-03-30 DIAGNOSIS — R2689 Other abnormalities of gait and mobility: Secondary | ICD-10-CM | POA: Insufficient documentation

## 2024-03-30 DIAGNOSIS — M25662 Stiffness of left knee, not elsewhere classified: Secondary | ICD-10-CM | POA: Insufficient documentation

## 2024-03-30 DIAGNOSIS — M25652 Stiffness of left hip, not elsewhere classified: Secondary | ICD-10-CM | POA: Diagnosis not present

## 2024-03-30 DIAGNOSIS — M25675 Stiffness of left foot, not elsewhere classified: Secondary | ICD-10-CM | POA: Insufficient documentation

## 2024-03-30 DIAGNOSIS — R600 Localized edema: Secondary | ICD-10-CM | POA: Diagnosis not present

## 2024-03-30 DIAGNOSIS — M6281 Muscle weakness (generalized): Secondary | ICD-10-CM | POA: Diagnosis not present

## 2024-03-30 DIAGNOSIS — R2681 Unsteadiness on feet: Secondary | ICD-10-CM | POA: Insufficient documentation

## 2024-03-30 DIAGNOSIS — M25672 Stiffness of left ankle, not elsewhere classified: Secondary | ICD-10-CM | POA: Insufficient documentation

## 2024-03-30 NOTE — Therapy (Signed)
 OUTPATIENT PHYSICAL THERAPY NEURO TREATMENT   Patient Name: Michael [name] MRN: [id number] DOB:13-May-1956, 68 y.o., male Today's Date: 03/30/2024   PCP: Arcadio Knuckles, MD REFERRING PROVIDER: Persons, Norma Beckers, [place]   END OF SESSION:  PT End of Session - 03/30/24 1458     Visit Number 25    Number of Visits 33   29 + 4   Date for PT Re-Evaluation 04/17/24   pushed out due to possible scheduling delay   Authorization Type [employer] MEDICARE    Progress Note Due on Visit 10    PT Start Time 1410    PT Stop Time 1452    PT Time Calculation (min) 42 min    Equipment Utilized During Treatment Gait belt    Activity Tolerance Patient tolerated treatment well    Behavior During Therapy WFL for tasks assessed/performed              Past Medical History:  Diagnosis Date   Hyperlipidemia    Hypertension    Lumbar disc disease    Prostate cancer (HCC)    Past Surgical History:  Procedure Laterality Date   LUMBAR DISC SURGERY  2014   LUMBAR DISC SURGERY     right ankle fracture  1978   external pinning   Patient Active Problem List   Diagnosis Date Noted   Anemia due to zinc  deficiency 01/28/2024   Deficiency anemia 01/22/2024   Bradycardia, sinus 01/22/2024   Screening for colon cancer 01/22/2024   Need for prophylactic vaccination with combined diphtheria-tetanus-pertussis (DTP) vaccine 01/22/2024   Frequent unifocal PVCs 01/22/2024   MGUS (monoclonal gammopathy of unknown significance) 06/10/2023   RBC microcytosis 06/10/2023   DDD (degenerative disc disease), lumbar 12/21/2022   Prostate cancer (HCC); T1c; GG 1; initial diagnosis 2011 12/05/2022   Chronic hyperglycemia 11/16/2022   Dyslipidemia, goal LDL below 70 09/23/2022   Elevated PSA, between 10 and less than 20 ng/ml 09/17/2022   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   Vitamin D  deficiency 09/07/2021    ONSET DATE: symptoms began in 2020; pt had a fall August 23, 2023  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  He recently cut his grass.  He reports he feel limber today.  Pt accompanied by: self  PERTINENT HISTORY: degenerative disc disease (followed by [facility]) s/p L5-S1 surgery, HLD, HTN, prostate cancer  Patient's symptoms started in 2020 - right flank pain (reported initially as abdominal pain, but pt clarifies this is not a deep cramp/stomach pain, but a muscular soreness especially with movement), core weakness (not able to sit up well), sneezing and coughing was painful as well.  A year later he noted stiffness in his legs after sitting where he has to walk up his legs with his hands.  Now, he reports numbness and soreness in left hamstring area as well as swelling and muscle spasms (left worse).  In order to walk he has to weight shift and bring feet into narrowed BOS then start with small steps.  He loosens up with movement. He reports freezing when startled. Ambulates w/ SPC.  Upper body remains WNL.  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  Pt has a fall August 23, 2023 halting his prior PT episode due to broken ribs.  PAIN:  Are you having pain? No  PRECAUTIONS: Fall  RED FLAGS: None-pt does report more urinary urgency that he  reports corresponds with the reported itch in his legs and low back, but he has discussed this with his physician (Dr. Genita Keys)  WEIGHT BEARING RESTRICTIONS: No  FALLS: Has patient fallen in last 6 months? Yes. 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  He modifies some homemaking tasks, but reports he can stand longer to do things like cooking.  PATIENT GOALS: Work on upper body strength  OBJECTIVE:   DIAGNOSTIC FINDINGS:  EMG on 05/14/23 showed an active left S1 radiculopathy (similar to prior EMG)  X-ray Bone Survey Met (11/06/2023):   IMPRESSION: No evidence of lytic or destructive bone lesion  X-ray Ribs Unilateral Right (09/04/2023): Radiographs of his right ribs demonstrate no more blunting of the  costophrenic angle he does have vascular markings going out to the  periphery.   COGNITION: Overall cognitive status: Within functional limits for tasks assessed and pt hyperverbal   SENSATION: Light touch: WFL  COORDINATION: LE RAMS:  slow and deliberate Bilateral Heel-to-shin:  limited by lack of bilateral ER  EDEMA:  bilateral ankle edema (baseline per pt - he has been using castor oil and lavender to improve skin integrity - MD aware of regimen)  MUSCLE TONE: None noted in BLE  POSTURE: weight shift right  LOWER EXTREMITY ROM:     Active  Right Eval Left Eval  Hip flexion Grossly WFL - limited bilateral ER (basically at neutral  Hip extension   Hip abduction   Hip adduction   Hip internal  rotation   Hip external rotation   Knee flexion   Knee extension   Ankle dorsiflexion   Ankle plantarflexion    Ankle inversion  Ankle eversion     (Blank rows = not tested)  LOWER EXTREMITY MMT:    MMT Right Eval Left Eval  Hip flexion 3/5 3+/5  Hip extension    Hip abduction    Hip adduction    Hip internal rotation    Hip external rotation    Knee flexion    Knee extension 4+/5 4+/5  Ankle dorsiflexion 3+/5 4/5  Ankle plantarflexion    Ankle inversion    Ankle eversion    (Blank rows = not tested)  BED MOBILITY:  Pt reports his bed mobility has greatly improved and he has switched his setup so he enters and exits bed on side closest to his bathroom  TRANSFERS: Assistive device utilized: None  Sit to stand: Modified independence - uses hands Stand to sit: Modified independence Chair to chair: Complete Independence Floor: pt performed modI on most recent fall  GAIT: Gait pattern: step through pattern, decreased step length- Right, decreased step length- Left, decreased stride length, decreased hip/knee flexion- Right, decreased hip/knee flexion- Left, lateral lean- Right, decreased trunk rotation, and narrow BOS Distance walked: various clinic distances Assistive device utilized: None Level of assistance: SBA Comments: No notable ataxia or involuntary movements during ambulation into/out of clinic.  FUNCTIONAL TESTS:  5 times sit to stand: 24.72 seconds w/ light BUE support - wide stance with some crouching into tall stand due to imbalance Timed up and go (TUG): To be assessed. 10 meter walk test: To be assessed.  PATIENT SURVEYS:  ABC scale To be assessed.  TODAY'S TREATMENT:    03/30/24                                                                                                                          -SciFit x10 minutes using BUE/BLE progressing to level 8.0 for neural priming for large amplitude reciprocal mobility and general conditioning.  Prolonged  standing rest break following.  -Boxer's stance for closed chain hip IR/ER during jab cross drill x10 head and body on free standing bag> added dual task (head = name plant, body = name food) w/ repeated drill  -Spanish squats to chair touch x15 no UE support, cues to better engage RLE -Modified incline mountain climber w/ open hip to improve hip ER and abduction engagement x15 each side  PATIENT EDUCATION: Education details: Aquatic HEP - attempt when he has access to a pool.  Continue HEP w/ additions and chair yoga.  Continue walking program.  Can add side-bending stretch to routine as desired.   Person educated: Patient Education method: Explanation Education comprehension: verbalized understanding and needs further education  HOME EXERCISE PROGRAM: Review from episode prior - modify as needed:  Access Code: [email or web address]  You Can Walk For A Certain Length Of Time Each Day (can start with 3-4 days and work up to everyday - use cane for safety and walk in well lit familiar areas with smooth surfaces)  Walk 5 minutes 2 times per day.             Increase 2  minutes every 7 days              Work up to 20 minutes (1x per day).               Example:                         Day 1-2           4-5 minutes     3 times per day                         Day 7-8           10-12 minutes 2-3 times per day                         Day 13-14       20-22 minutes 1-2 times per day  Chair yoga poses 1-6, 8-9 (modified position 9 and held x10 seconds), 12  Access Code: [email or web address] URL: [email or web address] Date: 03/09/2024 Prepared by: Marilou Showman  Exercises - Piriformis Stretch at [employer]  - 1 x daily - 1-2 x weekly - 3 sets - 10 reps - Standing 3-Way Kick with Ankle Float at [employer]  - 1 x daily - 1-2 x weekly - 3 sets - 10 reps - Hip Flexor Stretch at [employer]  - 1 x daily - 1-2 x weekly - 3 sets - 10 reps - Bilateral Shoulder Flexion Extension with  Hand Floats at [employer]  - 1 x daily - 1-2 x weekly - 3 sets - 10 reps - Plank at [employer]  - 1 x daily - 1-2 x weekly - 3 sets - 10 reps - Side to Side Hamstring Stretch with Noodle at [employer]  - 1 x daily - 1-2 x weekly - 3 sets - 10 reps - Lobbyist at [employer]  - 1 x daily - 1-2 x weekly - 3 sets - 10 reps - Squat  - 1 x daily - 1-2 x weekly - 3 sets - 10 reps - Heel Toe Raises at Pool Wall  - 1 x daily - 1-2 x weekly - 3 sets - 10 reps - Side Stepping  - 1 x daily - 1-2 x weekly - 3 sets - 10 reps - Forward Walking  - 1 x daily - 1-2 x weekly - 3 sets - 10 reps - Backward Walking  - 1 x daily - 1-2 x weekly - 3 sets - 10 reps - Standing Hip Circles at [employer]  - 1 x daily - 1-2 x weekly - 3 sets - 10 reps - Standing March at [facility]  - 1 x daily - 1-2 x weekly - 3 sets - 10 reps -Side-bending stretch  GOALS: Goals reviewed with patient? Yes  SHORT TERM GOALS: Target date: 12/27/2023  Pt will decrease 5xSTS to </=19.72 seconds with improved immediate standing balance in order to demonstrate decreased risk for falls and improved functional bilateral LE strength and power. Baseline:  24.72 seconds w/ light BUE support; 15.97 sec w/ BUE support (3/3) Goal status: MET  2.  Pt will demonstrate TUG of </=17.09 seconds in order to decrease risk of falls and improve functional mobility using LRAD. Baseline:  22.09 sec no AD SBA (2/3); 12.86 sec no AD SBA (3/3) Goal status: MET  3.  Pt will demonstrate a gait speed of >/=2.52 feet/sec in order to decrease risk for falls. Baseline: 2.32 ft/sec (2/3); 2.74 ft/sec (3/3) Goal status: MET  LONG TERM GOALS: Target date: 01/24/2024  Pt will be independent and compliant with strength, stretching, and balance focused land-based HEP and aquatic HEP if continuing following discharge in order to maintain functional progress and improve mobility. Baseline:  IND/compliant per report (3/25) Goal status: MET  2.  Pt will decrease 5xSTS to  </=14.72 seconds in order to demonstrate decreased risk for falls and improved functional bilateral LE strength and power. Baseline: 24.72 seconds w/ light BUE support; 16.47 sec w/ intermittent touch to initiate stand (3/25) Goal status: PARTIALLY MET  3.  Pt will demonstrate TUG of </=12.09 seconds in order to decrease risk of falls and improve functional mobility using LRAD. Baseline: 22.09 sec no AD SBA (2/3); 12.22 sec IND (3/25) Goal status: PARTIALLY MET  4.  Pt will demonstrate a gait speed of >/=2.92 feet/sec in order to decrease risk for falls. Baseline: 2.32 ft/sec (2/3); 2.74 ft/sec (3/3); 2.85 ft/sec (3/25) Goal status: PARTIALLY MET  5.  Pt will ambulate >/=800 feet at IND level over unlevel outdoor surfaces and grass to promote household and community access. Baseline: 1000 ft over unlevel and grass IND (3/25) Goal status: MET  6.  Pt will improve ABC Scale to >/= 69.4% in order to demonstrate improved fall risk and engagement in daily activities. Baseline: 59.4% (2/3); 98.94% (3/25) Goal status: MET  GOALS (re-cert 7/82) : Goals reviewed with patient? Yes  SHORT TERM GOALS: Target date: 02/14/2024  Pt will be independent and compliant with aquatic HEP in order to maintain functional progress and improve mobility. Baseline:  To be established. Goal status: ONGOING  2.  Pt will demonstrate a gait speed of >/=2.92 feet/sec in order to decrease risk for falls. Baseline: 2.32 ft/sec (2/3); 2.74 ft/sec (3/3); 2.85 ft/sec (3/25); 3.01 ft/sec (5/2) Goal status: MET  LONG TERM GOALS: Target date: 03/06/2024  1.  Pt will decrease 5xSTS to </=14.72 seconds in order to demonstrate decreased risk for falls and improved functional bilateral LE strength and power. Baseline: 24.72 seconds w/ light BUE support; 16.47 sec w/ intermittent touch to initiate stand (3/25); 14.87 sec no UE support (5/12) Goal status: PARTIALLY MET  2.  Pt will demonstrate TUG of </=10 seconds in order to  decrease risk of falls and improve functional mobility using LRAD. Baseline: 22.09 sec no AD SBA (2/3); 12.22 sec IND (3/25); 10.75 sec IND (5/12) Goal status: PARTIALLY MET  3.  Pt will demonstrate a gait speed of >/=3.12 feet/sec in order to decrease risk for falls. Baseline: 2.32 ft/sec (2/3); 2.74 ft/sec (3/3); 2.85 ft/sec (3/25); 3.17 ft/sec (5/12) Goal status:  MET  GOALS (re-cert 9/56) : Goals reviewed with patient? Yes  SHORT TERM GOALS = LONG TERM GOALS: Target date: 04/10/2024  1.  Pt will decrease 5xSTS to </=14.72 seconds in order to demonstrate decreased risk for falls and improved functional bilateral LE strength and power. Baseline: 24.72 seconds w/ light BUE support; 16.47 sec w/ intermittent touch to initiate stand (3/25); 14.87 sec no UE support (5/12) Goal status: PARTIALLY MET  2.  Pt will demonstrate TUG of </=10 seconds in order to decrease risk of falls and improve functional mobility using LRAD. Baseline: 22.09 sec no AD SBA (2/3); 12.22 sec IND (3/25); 10.75  sec IND (5/12) Goal status: PARTIALLY MET  3.  Pt will demonstrate a gait speed of >/=3.37 feet/sec in order to decrease risk for falls. Baseline: 2.32 ft/sec (2/3); 2.74 ft/sec (3/3); 2.85 ft/sec (3/25); 3.17 ft/sec (5/12)    Goal status: REVISED  ASSESSMENT:  CLINICAL IMPRESSION: Focus of skilled session on addressing LE mobility and closed chain IR/ER to improve flexibility.  He was most challenged by dual tasking component added to boxer's stance task this visit.  He continues to benefit from skilled PT to address functional mobility and improved ROM in BLE.  PT to continue per POC.  OBJECTIVE IMPAIRMENTS: decreased activity tolerance, decreased balance, decreased coordination, decreased endurance, decreased knowledge of condition, decreased mobility, difficulty walking, decreased strength, increased edema, and increased muscle spasms.   ACTIVITY LIMITATIONS: carrying, lifting, bending, sitting,  standing, squatting, stairs, transfers, bed mobility, and locomotion level  PARTICIPATION LIMITATIONS: shopping, community activity, and yard work  PERSONAL FACTORS: Age, Fitness, and 1-2 comorbidities: HTN, ongoing S1 radiculopathy are also affecting patient's functional outcome.   REHAB POTENTIAL: Excellent  CLINICAL DECISION MAKING: Evolving/moderate complexity  EVALUATION COMPLEXITY: Moderate  PLAN:  PT FREQUENCY: 2x/week (1 land and 1 aquatic for initial 3 weeks of re-cert) + 1x/wk  PT DURATION: 8 weeks + 6 weeks (1/61 re-cert) + 4 wks (0/96 re-cert)  PLANNED INTERVENTIONS: 04540- PT Re-evaluation, 97110-Therapeutic exercises, 97530- Therapeutic activity, 97112- Neuromuscular re-education, 97535- Self Care, 98119- Manual therapy, [phone]- Gait training, [phone]- Orthotic Fit/training, [phone]- Aquatic Therapy, Patient/Family education, Balance training, Stair training, Taping, Dry Needling, Joint mobilization, Spinal mobilization, Vestibular training, DME instructions, Moist heat, Therapeutic exercises, Therapeutic activity, Neuromuscular re-education, Gait training, and Self Care  PLAN FOR NEXT SESSION: Modify land HEP for core and LE strength, stretching, and balance.  SciFit for reciprocal mobility.  Upper body strength. Hip flexor strength.  Hip IR/ER, lower trunk mobility, knee mobility  Marilou Showman, PT, DPT 03/30/24 3:00 PM

## 2024-04-10 ENCOUNTER — Encounter: Payer: Self-pay | Admitting: Physical Therapy

## 2024-04-10 ENCOUNTER — Ambulatory Visit: Admitting: Physical Therapy

## 2024-04-10 DIAGNOSIS — M25662 Stiffness of left knee, not elsewhere classified: Secondary | ICD-10-CM

## 2024-04-10 DIAGNOSIS — Z9181 History of falling: Secondary | ICD-10-CM | POA: Diagnosis not present

## 2024-04-10 DIAGNOSIS — R2681 Unsteadiness on feet: Secondary | ICD-10-CM | POA: Diagnosis not present

## 2024-04-10 DIAGNOSIS — M6281 Muscle weakness (generalized): Secondary | ICD-10-CM

## 2024-04-10 DIAGNOSIS — M25675 Stiffness of left foot, not elsewhere classified: Secondary | ICD-10-CM

## 2024-04-10 DIAGNOSIS — R600 Localized edema: Secondary | ICD-10-CM

## 2024-04-10 DIAGNOSIS — M25672 Stiffness of left ankle, not elsewhere classified: Secondary | ICD-10-CM | POA: Diagnosis not present

## 2024-04-10 DIAGNOSIS — M25652 Stiffness of left hip, not elsewhere classified: Secondary | ICD-10-CM

## 2024-04-10 DIAGNOSIS — G2582 Stiff-man syndrome: Secondary | ICD-10-CM | POA: Diagnosis not present

## 2024-04-10 DIAGNOSIS — R2689 Other abnormalities of gait and mobility: Secondary | ICD-10-CM

## 2024-04-10 NOTE — Therapy (Signed)
 OUTPATIENT PHYSICAL THERAPY NEURO TREATMENT - DISCHARGE SUMMARY   Patient Name: Michael [name] MRN: [id number] DOB:[birth year], 68 y.o., male Today's Date: 04/10/2024   PCP: Arcadio Knuckles, MD REFERRING PROVIDER: Persons, Norma Beckers, PA  PHYSICAL THERAPY DISCHARGE SUMMARY  Visits from Start of Care: 26  Current functional level related to goals / functional outcomes: See clinical impression statement   Remaining deficits: Fluctuation in level of stiffness impacting by consistency with medication and movement   Education / Equipment: Aquatic HEP - attempt when he has access to a pool - discussed options for silver sneakers (calling the [employer] and local [employer] vs [employer]).  Continue HEP w/ additions and chair yoga.  Continue walking program.  Can add side-bending stretch to routine as desired.  Sock-aid options.  Plan for discharge today, progress towards goals, and likely benefit from episodic care to check in in 3-4 months.   Patient agrees to discharge. Patient goals were partially met. Patient is being discharged due to maximized rehab potential.   END OF SESSION:  PT End of Session - 04/10/24 1412     Visit Number 26    Number of Visits 33   29 + 4   Date for PT Re-Evaluation 04/17/24   pushed out due to possible scheduling delay   Authorization Type [employer] MEDICARE    Progress Note Due on Visit 10    PT Start Time 1408    PT Stop Time 1448    PT Time Calculation (min) 40 min    Equipment Utilized During Treatment Gait belt    Activity Tolerance Patient tolerated treatment well    Behavior During Therapy WFL for tasks assessed/performed           Past Medical History:  Diagnosis Date   Hyperlipidemia    Hypertension    Lumbar disc disease    Prostate cancer (HCC)    Past Surgical History:  Procedure Laterality Date   LUMBAR DISC SURGERY  2014   LUMBAR DISC SURGERY     right ankle fracture  1978   external pinning   Patient Active Problem List    Diagnosis Date Noted   Anemia due to zinc  deficiency 01/28/2024   Deficiency anemia 01/22/2024   Bradycardia, sinus 01/22/2024   Screening for colon cancer 01/22/2024   Need for prophylactic vaccination with combined diphtheria-tetanus-pertussis (DTP) vaccine 01/22/2024   Frequent unifocal PVCs 01/22/2024   MGUS (monoclonal gammopathy of unknown significance) 06/10/2023   RBC microcytosis 06/10/2023   DDD (degenerative disc disease), lumbar 12/21/2022   Prostate cancer (HCC); T1c; GG 1; initial diagnosis 2011 12/05/2022   Chronic hyperglycemia 11/16/2022   Dyslipidemia, goal LDL below 70 09/23/2022   Elevated PSA, between 10 and less than 20 ng/ml 09/17/2022   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   Vitamin D  deficiency 09/07/2021    ONSET DATE: symptoms began in 2020; pt had a fall August 23, 2023  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  He recently cut down a limb from a tree in his yard and felt dizzy staring up to trim the branches for a prolonged period, but this improved when he finished up. Pt accompanied by: self  PERTINENT HISTORY: degenerative disc disease (followed by [facility]) s/p L5-S1 surgery, HLD, HTN, prostate cancer  Patient's symptoms started in 2020 - right flank pain  (reported initially as abdominal pain, but pt clarifies this is not a deep cramp/stomach pain, but a muscular soreness especially with movement), core weakness (not able to sit up well), sneezing and coughing was painful as well.  A year later he noted stiffness in his legs after sitting where he has to walk up his legs with his hands.  Now, he reports numbness and soreness in left hamstring area as well as swelling and muscle spasms (left worse).  In order to walk he has to weight shift and bring feet into narrowed BOS then start with small steps.  He loosens up with movement. He reports freezing when startled. Ambulates w/ SPC.  Upper body remains WNL.  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  Pt has a fall August 23, 2023 halting his prior PT episode due to broken ribs.  PAIN:  Are you having pain? No  PRECAUTIONS: Fall  RED FLAGS: None-pt does report more urinary urgency that he reports corresponds with the reported itch in his legs and low back, but he has discussed this with his physician (Dr. Genita Keys)  WEIGHT BEARING RESTRICTIONS: No  FALLS: Has patient fallen in last 6 months? Yes. 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  He modifies some homemaking tasks, but reports he can stand longer to do things like cooking.  PATIENT GOALS: Work on upper body strength  OBJECTIVE:   DIAGNOSTIC FINDINGS:  EMG on 05/14/23  showed an active left S1 radiculopathy (similar to prior EMG)  X-ray Bone Survey Met (11/06/2023):   IMPRESSION: No evidence of lytic or destructive bone lesion  X-ray Ribs Unilateral Right (09/04/2023): Radiographs of his right ribs demonstrate no more blunting of the  costophrenic angle he does have vascular markings going out to the  periphery.   COGNITION: Overall cognitive status: Within functional limits for tasks assessed and pt hyperverbal   SENSATION: Light touch: WFL  COORDINATION: LE RAMS:  slow and deliberate Bilateral Heel-to-shin:  limited by lack of bilateral ER  EDEMA:  bilateral ankle edema (baseline per pt - he has been using castor oil and lavender to improve skin integrity - MD aware of regimen)  MUSCLE TONE: None noted in BLE  POSTURE: weight shift right  LOWER EXTREMITY ROM:     Active  Right Eval Left Eval  Hip flexion Grossly WFL - limited bilateral ER (basically at neutral  Hip extension   Hip abduction   Hip adduction   Hip internal rotation   Hip external rotation   Knee  flexion   Knee extension   Ankle dorsiflexion   Ankle plantarflexion    Ankle inversion    Ankle eversion     (Blank rows = not tested)  LOWER EXTREMITY MMT:    MMT Right Eval Left Eval  Hip flexion 3/5 3+/5  Hip extension    Hip abduction    Hip adduction    Hip internal rotation    Hip external rotation    Knee flexion    Knee extension 4+/5 4+/5  Ankle dorsiflexion 3+/5 4/5  Ankle plantarflexion    Ankle inversion    Ankle eversion    (Blank rows = not tested)  BED MOBILITY:  Pt reports his bed mobility has greatly improved and he has switched his setup so he enters and exits bed on side closest to his bathroom  TRANSFERS: Assistive device utilized: None  Sit to stand: Modified independence - uses hands Stand to sit: Modified independence Chair to chair: Complete Independence Floor: pt performed modI on most recent fall  GAIT: Gait pattern: step  through pattern, decreased step length- Right, decreased step length- Left, decreased stride length, decreased hip/knee flexion- Right, decreased hip/knee flexion- Left, lateral lean- Right, decreased trunk rotation, and narrow BOS Distance walked: various clinic distances Assistive device utilized: None Level of assistance: SBA Comments: No notable ataxia or involuntary movements during ambulation into/out of clinic.  FUNCTIONAL TESTS:  5 times sit to stand: 24.72 seconds w/ light BUE support - wide stance with some crouching into tall stand due to imbalance Timed up and go (TUG): To be assessed. 10 meter walk test: To be assessed.  PATIENT SURVEYS:  ABC scale To be assessed.  TODAY'S TREATMENT:    04/10/24                                                                                                                          -Discussed vertigo symptoms and presentation which only happens when tipping his head up for prolonged periods, explained BPPV and feeling his specific symptoms may need further evaluation by his doctor to determine if he may have a vascular contributor vs vestibular hypofunction/habituation issue as it does not sound like BPPV at current.  MD can refer back for BPPV assessment if necessary. -5xSTS (trial 1):  17.16 sec w/ pt intermittently stopping to adjust foot placement during test, edu against this and reset prior to next assessment -5xSTS (trial 2):  15.31 sec w/o UE support, discussed and practiced improved foot placement for stand to decrease bumping back of knee against chair when standing - :  10.00 sec IND = 1.0 m/sec OR 3.3 ft/sec -SciFit per pt request in multi-peaks mode x10 minutes using BUE/BLE for large amplitude reciprocal mobility and global strengthening.  -Discussed sock-aid to help on stiffer days for donning socks as pt would like to be able to wear socks in winter and have ease with compression stockings, printed resources for [facility] purchase  options.  Demonstrated use to patient  w/ 2 options in clinic.  PATIENT EDUCATION: Education details: Aquatic HEP - attempt when he has access to a pool - discussed options for silver sneakers (calling the [employer] and local [employer] vs [employer]).  Continue HEP w/ additions and chair yoga.  Continue walking program.  Can add side-bending stretch to routine as desired.  Sock-aid options.  Plan for discharge today, progress towards goals, and likely benefit from episodic care to check in in 3-4 months. Person educated: Patient Education method: Explanation Education comprehension: verbalized understanding and needs further education  HOME EXERCISE PROGRAM: Review from episode prior - modify as needed:  Access Code: [email or web address]  You Can Walk For A Certain Length Of Time Each Day (can start with 3-4 days and work up to everyday - use cane for safety and walk in well lit familiar areas with smooth surfaces)                          Walk 5 minutes 2 times per day.             Increase 2  minutes every 7 days              Work up to 20 minutes (1x per day).               Example:                         Day 1-2           4-5 minutes     3 times per day                         Day 7-8           10-12 minutes 2-3 times per day                         Day 13-14       20-22 minutes 1-2 times per day  Chair yoga poses 1-6, 8-9 (modified position 9 and held x10 seconds), 12  Access Code: [email or web address] URL: [email or web address] Date: 03/09/2024 Prepared by: Marilou Showman  Exercises - Piriformis Stretch at [employer]  - 1 x daily - 1-2 x weekly - 3 sets - 10 reps - Standing 3-Way Kick with Ankle Float at [employer]  - 1 x daily - 1-2 x weekly - 3 sets - 10 reps - Hip Flexor Stretch at [employer]  - 1 x daily - 1-2 x weekly - 3 sets - 10 reps - Bilateral Shoulder Flexion Extension with Hand Floats at [employer]  - 1 x daily - 1-2 x weekly - 3 sets - 10 reps - Plank at [employer]  - 1 x daily -  1-2 x weekly - 3 sets - 10 reps - Side to Side Hamstring Stretch with Noodle at [employer]  - 1 x daily - 1-2 x weekly - 3 sets - 10 reps - Lobbyist at [employer]  - 1 x daily - 1-2 x weekly - 3 sets - 10 reps - Squat  - 1 x daily - 1-2 x weekly - 3 sets - 10 reps - Heel Toe Raises at [employer] Wall  - 1 x daily - 1-2 x weekly - 3 sets - 10 reps - Side Stepping  - 1 x daily - 1-2 x  weekly - 3 sets - 10 reps - Forward Walking  - 1 x daily - 1-2 x weekly - 3 sets - 10 reps - Backward Walking  - 1 x daily - 1-2 x weekly - 3 sets - 10 reps - Standing Hip Circles at [employer]  - 1 x daily - 1-2 x weekly - 3 sets - 10 reps - Standing March at [facility]  - 1 x daily - 1-2 x weekly - 3 sets - 10 reps -Side-bending stretch  GOALS: Goals reviewed with patient? Yes  SHORT TERM GOALS: Target date: 12/27/2023  Pt will decrease 5xSTS to </=19.72 seconds with improved immediate standing balance in order to demonstrate decreased risk for falls and improved functional bilateral LE strength and power. Baseline:  24.72 seconds w/ light BUE support; 15.97 sec w/ BUE support (3/3) Goal status: MET  2.  Pt will demonstrate TUG of </=17.09 seconds in order to decrease risk of falls and improve functional mobility using LRAD. Baseline: 22.09 sec no AD SBA (2/3); 12.86 sec no AD SBA (3/3) Goal status: MET  3.  Pt will demonstrate a gait speed of >/=2.52 feet/sec in order to decrease risk for falls. Baseline: 2.32 ft/sec (2/3); 2.74 ft/sec (3/3) Goal status: MET  LONG TERM GOALS: Target date: 01/24/2024  Pt will be independent and compliant with strength, stretching, and balance focused land-based HEP and aquatic HEP if continuing following discharge in order to maintain functional progress and improve mobility. Baseline:  IND/compliant per report (3/25) Goal status: MET  2.  Pt will decrease 5xSTS to </=14.72 seconds in order to demonstrate decreased risk for falls and improved functional bilateral LE strength  and power. Baseline: 24.72 seconds w/ light BUE support; 16.47 sec w/ intermittent touch to initiate stand (3/25) Goal status: PARTIALLY MET  3.  Pt will demonstrate TUG of </=12.09 seconds in order to decrease risk of falls and improve functional mobility using LRAD. Baseline: 22.09 sec no AD SBA (2/3); 12.22 sec IND (3/25) Goal status: PARTIALLY MET  4.  Pt will demonstrate a gait speed of >/=2.92 feet/sec in order to decrease risk for falls. Baseline: 2.32 ft/sec (2/3); 2.74 ft/sec (3/3); 2.85 ft/sec (3/25) Goal status: PARTIALLY MET  5.  Pt will ambulate >/=800 feet at IND level over unlevel outdoor surfaces and grass to promote household and community access. Baseline: 1000 ft over unlevel and grass IND (3/25) Goal status: MET  6.  Pt will improve ABC Scale to >/= 69.4% in order to demonstrate improved fall risk and engagement in daily activities. Baseline: 59.4% (2/3); 98.94% (3/25) Goal status: MET  GOALS (re-cert 1/61) : Goals reviewed with patient? Yes  SHORT TERM GOALS: Target date: 02/14/2024  Pt will be independent and compliant with aquatic HEP in order to maintain functional progress and improve mobility. Baseline:  To be established. Goal status: ONGOING  2.  Pt will demonstrate a gait speed of >/=2.92 feet/sec in order to decrease risk for falls. Baseline: 2.32 ft/sec (2/3); 2.74 ft/sec (3/3); 2.85 ft/sec (3/25); 3.01 ft/sec (5/2) Goal status: MET  LONG TERM GOALS: Target date: 03/06/2024  1.  Pt will decrease 5xSTS to </=14.72 seconds in order to demonstrate decreased risk for falls and improved functional bilateral LE strength and power. Baseline: 24.72 seconds w/ light BUE support; 16.47 sec w/ intermittent touch to initiate stand (3/25); 14.87 sec no UE support (5/12) Goal status: PARTIALLY MET  2.  Pt will demonstrate TUG of </=10 seconds in order to decrease risk  of falls and improve functional mobility using LRAD. Baseline: 22.09 sec no AD SBA (2/3); 12.22  sec IND (3/25); 10.75 sec IND (5/12) Goal status: PARTIALLY MET  3.  Pt will demonstrate a gait speed of >/=3.12 feet/sec in order to decrease risk for falls. Baseline: 2.32 ft/sec (2/3); 2.74 ft/sec (3/3); 2.85 ft/sec (3/25); 3.17 ft/sec (5/12) Goal status:  MET  GOALS (re-cert 1/61) : Goals reviewed with patient? Yes  SHORT TERM GOALS = LONG TERM GOALS: Target date: 04/10/2024  1.  Pt will decrease 5xSTS to </=14.72 seconds in order to demonstrate decreased risk for falls and improved functional bilateral LE strength and power. Baseline: 24.72 seconds w/ light BUE support; 16.47 sec w/ intermittent touch to initiate stand (3/25); 14.87 sec no UE support (5/12); 15.31 sec w/o UE support (6/13) Goal status: NOT MET  2.  Pt will demonstrate TUG of </=10 seconds in order to decrease risk of falls and improve functional mobility using LRAD. Baseline: 22.09 sec no AD SBA (2/3); 12.22 sec IND (3/25); 10.75 sec IND (5/12); 11.12 sec IND (6/13) Goal status: NOT MET  3.  Pt will demonstrate a gait speed of >/=3.37 feet/sec in order to decrease risk for falls. Baseline: 2.32 ft/sec (2/3); 2.74 ft/sec (3/3); 2.85 ft/sec (3/25); 3.17 ft/sec (5/12); 3.3 ft/sec (6/13)    Goal status: PARTIALLY MET  ASSESSMENT:  CLINICAL IMPRESSION: Emphasis of skilled session today on goal assessment in preparation for discharge.  He has made great progress with land and aquatic therapy.  At current his scores have leveled out on all assessments with only modest progress made in walking speed to 3.3 ft/sec.  His TUG time, while longer than assessment prior, remains WNL.  His lingering stiffness fluctuates from day to day impacting his performance on things like 5xSTS where he requires no arm support, but moves with slower arc of motion. He has aquatic and land based programs that he is independent with in order to maintain progress.  He was instructed in use of supplemental devices to help with sock management on harder  days.  He is in agreement to discharge at this time and may benefit from episode of care to continue assisting in management of condition in several months.  Will proceed with discharge at this time.  OBJECTIVE IMPAIRMENTS: decreased activity tolerance, decreased balance, decreased coordination, decreased endurance, decreased knowledge of condition, decreased mobility, difficulty walking, decreased strength, increased edema, and increased muscle spasms.   ACTIVITY LIMITATIONS: carrying, lifting, bending, sitting, standing, squatting, stairs, transfers, bed mobility, and locomotion level  PARTICIPATION LIMITATIONS: shopping, community activity, and yard work  PERSONAL FACTORS: Age, Fitness, and 1-2 comorbidities: HTN, ongoing S1 radiculopathy are also affecting patient's functional outcome.   REHAB POTENTIAL: Excellent  CLINICAL DECISION MAKING: Evolving/moderate complexity  EVALUATION COMPLEXITY: Moderate  PLAN:  PT FREQUENCY: 2x/week (1 land and 1 aquatic for initial 3 weeks of re-cert) + 1x/wk  PT DURATION: 8 weeks + 6 weeks (0/96 re-cert) + 4 wks (0/45 re-cert)  PLANNED INTERVENTIONS: 40981- PT Re-evaluation, 97110-Therapeutic exercises, 97530- Therapeutic activity, 97112- Neuromuscular re-education, 97535- Self Care, [id number]- Manual therapy, [phone]- Gait training, [phone]- Orthotic Fit/training, [phone]- Aquatic Therapy, Patient/Family education, Balance training, Stair training, Taping, Dry Needling, Joint mobilization, Spinal mobilization, Vestibular training, DME instructions, Moist heat, Therapeutic exercises, Therapeutic activity, Neuromuscular re-education, Gait training, and Self Care  PLAN FOR NEXT SESSION: N/A  Marilou Showman, PT, DPT 04/10/24 2:59 PM

## 2024-04-23 ENCOUNTER — Ambulatory Visit: Admitting: Internal Medicine

## 2024-04-23 ENCOUNTER — Encounter: Payer: Self-pay | Admitting: Internal Medicine

## 2024-04-23 VITALS — BP 160/100 | HR 53 | Temp 98.8°F | Ht 75.0 in | Wt 225.2 lb

## 2024-04-23 DIAGNOSIS — E785 Hyperlipidemia, unspecified: Secondary | ICD-10-CM

## 2024-04-23 DIAGNOSIS — I10 Essential (primary) hypertension: Secondary | ICD-10-CM

## 2024-04-23 DIAGNOSIS — Z23 Encounter for immunization: Secondary | ICD-10-CM

## 2024-04-23 DIAGNOSIS — R972 Elevated prostate specific antigen [PSA]: Secondary | ICD-10-CM

## 2024-04-23 DIAGNOSIS — D538 Other specified nutritional anemias: Secondary | ICD-10-CM | POA: Diagnosis not present

## 2024-04-23 LAB — BASIC METABOLIC PANEL WITH GFR
BUN: 17 mg/dL (ref 6–23)
CO2: 28 meq/L (ref 19–32)
Calcium: 9.2 mg/dL (ref 8.4–10.5)
Chloride: 102 meq/L (ref 96–112)
Creatinine, Ser: 0.79 mg/dL (ref 0.40–1.50)
GFR: 92.68 mL/min (ref >60.00)
Glucose, Bld: 86 mg/dL (ref 70–99)
Potassium: 4.2 meq/L (ref 3.5–5.1)
Sodium: 136 meq/L (ref 135–145)

## 2024-04-23 LAB — CBC WITH DIFFERENTIAL/PLATELET
Basophils Absolute: 0.1 10*3/uL (ref 0.0–0.1)
Basophils Relative: 1.1 % (ref 0.0–3.0)
Eosinophils Absolute: 0.6 10*3/uL (ref 0.0–0.7)
Eosinophils Relative: 8.9 % — ABNORMAL HIGH (ref 0.0–5.0)
HCT: 39.8 % (ref 39.0–52.0)
Hemoglobin: 12.9 g/dL — ABNORMAL LOW (ref 13.0–17.0)
Lymphocytes Relative: 36.3 % (ref 12.0–46.0)
Lymphs Abs: 2.2 10*3/uL (ref 0.7–4.0)
MCHC: 32.5 g/dL (ref 30.0–36.0)
MCV: 72.8 fl — ABNORMAL LOW (ref 78.0–100.0)
Monocytes Absolute: 0.5 10*3/uL (ref 0.1–1.0)
Monocytes Relative: 7.7 % (ref 3.0–12.0)
Neutro Abs: 2.8 10*3/uL (ref 1.4–7.7)
Neutrophils Relative %: 46 % (ref 43.0–77.0)
Platelets: 167 10*3/uL (ref 150.0–400.0)
RBC: 5.47 Mil/uL (ref 4.22–5.81)
RDW: 14.1 % (ref 11.5–15.5)
WBC: 6.2 10*3/uL (ref 4.0–10.5)

## 2024-04-23 LAB — PSA: PSA: 5.75 ng/mL — ABNORMAL HIGH (ref 0.10–4.00)

## 2024-04-23 MED ORDER — ROSUVASTATIN CALCIUM 10 MG PO TABS: 10.0000 mg | ORAL_TABLET | Freq: Every day | ORAL | 1 refills | Status: AC

## 2024-04-23 MED ORDER — BOOSTRIX 5-2.5-18.5 LF-MCG/0.5 IM SUSP: 0.5000 mL | Freq: Once | INTRAMUSCULAR | 0 refills | Status: AC

## 2024-04-23 MED ORDER — TRIAMTERENE-HCTZ 37.5-25 MG PO CAPS
1.0000 | ORAL_CAPSULE | Freq: Every day | ORAL | 0 refills | Status: AC
Start: 2024-04-23 — End: ?

## 2024-04-23 NOTE — Progress Notes (Signed)
 Subjective:  Patient ID: Michael [name], male    DOB: 25-Feb-1956  Age: 68 y.o. MRN: [id number]  CC: Hypertension   HPI Michael [name] presents for f/up ---  Discussed the use of AI scribe software for clinical note transcription with the patient, who gave verbal consent to proceed.  History of Present Illness   Michael [name] is a 68 year old male who presents for follow-up regarding physical therapy and medication side effects.  He has experienced increased strength and range of motion from physical therapy, which has significantly improved his balance. He engages in activities such as gardening without experiencing headaches or blurred vision during physical activity.  Previously, he experienced swelling in his left leg, which he attributes to gabapentin  use. Since discontinuing gabapentin , the swelling has resolved.  He underwent a memory assessment during his last appointment approximately two weeks ago, which he reports went well. He has a follow-up scheduled for October.  No headaches, blurred vision, or swelling in his legs or feet since discontinuing gabapentin .       Outpatient Medications Prior to Visit  Medication Sig Dispense Refill   Cholecalciferol  50 MCG (2000 UT) TABS Take 1 tablet (2,000 Units total) by mouth daily. 90 tablet 1   diazepam  (VALIUM ) 5 MG tablet Take 1 tablet (5 mg total) by mouth every 6 (six) hours as needed for anxiety. 30 tablet 5   GAMUNEX-C  5 GM/50ML SOLN      GARLIC PO Take by mouth daily.     zinc  gluconate 50 MG tablet Take 1 tablet (50 mg total) by mouth daily. 90 tablet 1   rosuvastatin  (CRESTOR ) 10 MG tablet Take 1 tablet (10 mg total) by mouth daily. (Patient not taking: Reported on 04/23/2024) 90 tablet 1   triamterene -hydrochlorothiazide (DYAZIDE) 37.5-25 MG capsule Take 1 each (1 capsule total) by mouth daily. (Patient not taking: Reported on 04/23/2024) 90 capsule 1   No facility-administered medications prior  to visit.    ROS Review of Systems  Objective:  BP (!) 160/100 (BP Location: Left Arm, Patient Position: Sitting, Cuff Size: Normal)   Pulse (!) 53   Temp 98.8 F (37.1 C) (Temporal)   Ht 6' 3 (1.905 m)   Wt 225 lb 3.2 oz (102.2 kg)   SpO2 99%   BMI 28.15 kg/m   BP Readings from Last 3 Encounters:  04/23/24 (!) 160/100  02/13/24 (!) 154/77  02/10/24 122/76    Wt Readings from Last 3 Encounters:  04/23/24 225 lb 3.2 oz (102.2 kg)  02/13/24 233 lb (105.7 kg)  02/10/24 232 lb 3.2 oz (105.3 kg)    Physical Exam Vitals reviewed.  Constitutional:      Appearance: Normal appearance.  HENT:     Mouth/Throat:     Mouth: Mucous membranes are moist.   Eyes:     General: No scleral icterus.    Conjunctiva/sclera: Conjunctivae normal.    Cardiovascular:     Rate and Rhythm: Normal rate and regular rhythm.     Pulses:          Dorsalis pedis pulses are 1+ on the right side and 1+ on the left side.       Posterior tibial pulses are 1+ on the right side and 1+ on the left side.     Heart sounds: No murmur heard.    No friction rub. No gallop.  Pulmonary:     Effort: Pulmonary effort is normal.  Breath sounds: No stridor. No wheezing, rhonchi or rales.  Abdominal:     General: Abdomen is flat.     Palpations: There is no mass.     Tenderness: There is no abdominal tenderness. There is no guarding.     Hernia: No hernia is present.   Musculoskeletal:        General: Normal range of motion.     Cervical back: Neck supple.     Right lower leg: 1+ Pitting Edema present.     Left lower leg: 1+ Pitting Edema present.  Feet:     Right foot:     Skin integrity: Skin integrity normal.     Toenail Condition: Right toenails are abnormally thick and long.     Left foot:     Skin integrity: Skin integrity normal.     Toenail Condition: Left toenails are abnormally thick and long.  Lymphadenopathy:     Cervical: No cervical adenopathy.   Skin:    General: Skin is warm  and dry.     Findings: No rash.   Neurological:     General: No focal deficit present.     Mental Status: He is alert.   Psychiatric:        Mood and Affect: Mood normal.        Behavior: Behavior normal.     Lab Results  Component Value Date   WBC 6.2 04/23/2024   HGB 12.9 (L) 04/23/2024   HCT 39.8 04/23/2024   PLT 167.0 04/23/2024   GLUCOSE 86 04/23/2024   CHOL 162 01/22/2024   TRIG 57.0 01/22/2024   HDL 45.50 01/22/2024   LDLCALC 105 (H) 01/22/2024   ALT 13 11/06/2023   AST 17 11/06/2023   NA 136 04/23/2024   K 4.2 04/23/2024   CL 102 04/23/2024   CREATININE 0.79 04/23/2024   BUN 17 04/23/2024   CO2 28 04/23/2024   TSH 4.59 01/22/2024   PSA 5.75 (H) 04/23/2024   HGBA1C 4.4 (L) 01/22/2024    DG Bone Survey Met Result Date: 11/14/2023 CLINICAL DATA:  Staging a myeloma. MGUS. Patient reports right rib fractures in October. EXAM: METASTATIC BONE SURVEY COMPARISON:  Chest abdomen pelvis CT 06/17/2023, rib radiographs 08/26/2023 FINDINGS: No evidence of lytic or destructive bone lesions. Remote distal fibular fracture that has healed. R again seen multiple right rib fractures. Degenerative change throughout the cervical, thoracic, and lumbar spine without compression deformity. Left greater than right hip osteoarthritis. Right knee chondrocalcinosis. Emote IMPRESSION: No evidence of lytic or destructive bone lesion. Electronically Signed   By: Andrea Gasman M.D.   On: 11/14/2023 23:39    Assessment & Plan:  Elevated PSA, between 10 and less than 20 ng/ml -     PSA; Future  Dyslipidemia, goal LDL below 70 -     Rosuvastatin  Calcium ; Take 1 tablet (10 mg total) by mouth daily.  Dispense: 90 tablet; Refill: 1 -     AMB Referral [facility] Care Management  Anemia due to zinc  deficiency- H/H have improved. -     CBC with Differential/Platelet; Future  Accelerated hypertension- He has not achieved his BP goal due to non-compliance. -     Basic metabolic panel with GFR;  Future -     AMB Referral [facility] Care Management -     Triamterene -HCTZ; Take 1 each (1 capsule total) by mouth daily.  Dispense: 90 capsule; Refill: 0  Need for prophylactic vaccination with combined diphtheria-tetanus-pertussis (DTP) vaccine -  Boostrix ; Inject 0.5 mLs into the muscle once for 1 dose.  Dispense: 0.5 mL; Refill: 0     Follow-up: Return in about 3 months (around 07/24/2024).  Debby Molt, MD

## 2024-04-23 NOTE — Patient Instructions (Signed)
 Hypertension, Adult High blood pressure (hypertension) is when the force of blood pumping through the arteries is too strong. The arteries are the blood vessels that carry blood from the heart throughout the body. Hypertension forces the heart to work harder to pump blood and may cause arteries to become narrow or stiff. Untreated or uncontrolled hypertension can lead to a heart attack, heart failure, a stroke, kidney disease, and other problems. A blood pressure reading consists of a higher number over a lower number. Ideally, your blood pressure should be below 120/80. The first ("top") number is called the systolic pressure. It is a measure of the pressure in your arteries as your heart beats. The second ("bottom") number is called the diastolic pressure. It is a measure of the pressure in your arteries as the heart relaxes. What are the causes? The exact cause of this condition is not known. There are some conditions that result in high blood pressure. What increases the risk? Certain factors may make you more likely to develop high blood pressure. Some of these risk factors are under your control, including: Smoking. Not getting enough exercise or physical activity. Being overweight. Having too much fat, sugar, calories, or salt (sodium) in your diet. Drinking too much alcohol. Other risk factors include: Having a personal history of heart disease, diabetes, high cholesterol, or kidney disease. Stress. Having a family history of high blood pressure and high cholesterol. Having obstructive sleep apnea. Age. The risk increases with age. What are the signs or symptoms? High blood pressure may not cause symptoms. Very high blood pressure (hypertensive crisis) may cause: Headache. Fast or irregular heartbeats (palpitations). Shortness of breath. Nosebleed. Nausea and vomiting. Vision changes. Severe chest pain, dizziness, and seizures. How is this diagnosed? This condition is diagnosed by  measuring your blood pressure while you are seated, with your arm resting on a flat surface, your legs uncrossed, and your feet flat on the floor. The cuff of the blood pressure monitor will be placed directly against the skin of your upper arm at the level of your heart. Blood pressure should be measured at least twice using the same arm. Certain conditions can cause a difference in blood pressure between your right and left arms. If you have a high blood pressure reading during one visit or you have normal blood pressure with other risk factors, you may be asked to: Return on a different day to have your blood pressure checked again. Monitor your blood pressure at home for 1 week or longer. If you are diagnosed with hypertension, you may have other blood or imaging tests to help your health care provider understand your overall risk for other conditions. How is this treated? This condition is treated by making healthy lifestyle changes, such as eating healthy foods, exercising more, and reducing your alcohol intake. You may be referred for counseling on a healthy diet and physical activity. Your health care provider may prescribe medicine if lifestyle changes are not enough to get your blood pressure under control and if: Your systolic blood pressure is above 130. Your diastolic blood pressure is above 80. Your personal target blood pressure may vary depending on your medical conditions, your age, and other factors. Follow these instructions at home: Eating and drinking  Eat a diet that is high in fiber and potassium, and low in sodium, added sugar, and fat. An example of this eating plan is called the DASH diet. DASH stands for Dietary Approaches to Stop Hypertension. To eat this way: Eat  plenty of fresh fruits and vegetables. Try to fill one half of your plate at each meal with fruits and vegetables. Eat whole grains, such as whole-wheat pasta, brown rice, or whole-grain bread. Fill about one  fourth of your plate with whole grains. Eat or drink low-fat dairy products, such as skim milk or low-fat yogurt. Avoid fatty cuts of meat, processed or cured meats, and poultry with skin. Fill about one fourth of your plate with lean proteins, such as fish, chicken without skin, beans, eggs, or tofu. Avoid pre-made and processed foods. These tend to be higher in sodium, added sugar, and fat. Reduce your daily sodium intake. Many people with hypertension should eat less than 1,500 mg of sodium a day. Do not drink alcohol if: Your health care provider tells you not to drink. You are pregnant, may be pregnant, or are planning to become pregnant. If you drink alcohol: Limit how much you have to: 0-1 drink a day for women. 0-2 drinks a day for men. Know how much alcohol is in your drink. In the [place] one drink equals one 12 oz bottle of beer (355 mL), one 5 oz glass of wine (148 mL), or one 1 oz glass of hard liquor (44 mL). Lifestyle  Work with your health care provider to maintain a healthy body weight or to lose weight. Ask what an ideal weight is for you. Get at least 30 minutes of exercise that causes your heart to beat faster (aerobic exercise) most days of the week. Activities may include walking, swimming, or biking. Include exercise to strengthen your muscles (resistance exercise), such as Pilates or lifting weights, as part of your weekly exercise routine. Try to do these types of exercises for 30 minutes at least 3 days a week. Do not use any products that contain nicotine or tobacco. These products include cigarettes, chewing tobacco, and vaping devices, such as e-cigarettes. If you need help quitting, ask your health care provider. Monitor your blood pressure at home as told by your health care provider. Keep all follow-up visits. This is important. Medicines Take over-the-counter and prescription medicines only as told by your health care provider. Follow directions carefully. Blood  pressure medicines must be taken as prescribed. Do not skip doses of blood pressure medicine. Doing this puts you at risk for problems and can make the medicine less effective. Ask your health care provider about side effects or reactions to medicines that you should watch for. Contact a health care provider if you: Think you are having a reaction to a medicine you are taking. Have headaches that keep coming back (recurring). Feel dizzy. Have swelling in your ankles. Have trouble with your vision. Get help right away if you: Develop a severe headache or confusion. Have unusual weakness or numbness. Feel faint. Have severe pain in your chest or abdomen. Vomit repeatedly. Have trouble breathing. These symptoms may be an emergency. Get help right away. Call 911. Do not wait to see if the symptoms will go away. Do not drive yourself to the hospital. Summary Hypertension is when the force of blood pumping through your arteries is too strong. If this condition is not controlled, it may put you at risk for serious complications. Your personal target blood pressure may vary depending on your medical conditions, your age, and other factors. For most people, a normal blood pressure is less than 120/80. Hypertension is treated with lifestyle changes, medicines, or a combination of both. Lifestyle changes include losing weight, eating a healthy,  low-sodium diet, exercising more, and limiting alcohol. This information is not intended to replace advice given to you by your health care provider. Make sure you discuss any questions you have with your health care provider. Document Revised: 08/22/2021 Document Reviewed: 08/22/2021 Elsevier Patient Education  2024 [employer].

## 2024-04-24 ENCOUNTER — Ambulatory Visit: Payer: Self-pay | Admitting: Internal Medicine

## 2024-05-04 ENCOUNTER — Other Ambulatory Visit: Payer: Self-pay | Admitting: Neurology

## 2024-05-04 ENCOUNTER — Telehealth: Payer: Self-pay | Admitting: *Deleted

## 2024-05-04 DIAGNOSIS — G2582 Stiff-man syndrome: Secondary | ICD-10-CM

## 2024-05-04 NOTE — Progress Notes (Unsigned)
 Care Guide Pharmacy Note  05/04/2024 Name: Michael [name] MRN: [id number] DOB: 12/13/1955  Referred By: Joshua Debby CROME, MD Reason for referral: Call Attempt #1 and Complex Care Management (Outreach to schedule referral with pharmacist )   Michael [name] is a 68 y.o. year old male who is a primary care patient of Joshua Debby CROME, MD.  Michael [name] was referred to the pharmacist for assistance related to: HTN  An unsuccessful telephone outreach was attempted today to contact the patient who was referred to the pharmacy team for assistance with medication management. Additional attempts will be made to contact the patient.  Michael [name], CMA [place]  [facility], [facility] Guide Direct Dial: [phone]  Fax: [phone] Website: [email or web address]

## 2024-05-05 NOTE — Progress Notes (Unsigned)
 Care Guide Pharmacy Note  05/05/2024 Name: Michael [name] MRN: [id number] DOB: 1956/08/13  Referred By: Joshua Debby CROME, MD Reason for referral: Call Attempt #1 and Complex Care Management (Outreach to schedule referral with pharmacist )   Michael [name] is a 68 y.o. year old male who is a primary care patient of Joshua Debby CROME, MD.  Michael [name] was referred to the pharmacist for assistance related to: HTN  A second unsuccessful telephone outreach was attempted today to contact the patient who was referred to the pharmacy team for assistance with medication management. Additional attempts will be made to contact the patient.  Michael [name], CMA [place]  [facility], [facility] Guide Direct Dial: [phone]  Fax: [phone] Website: [email or web address]

## 2024-05-06 NOTE — Progress Notes (Signed)
 Care Guide Pharmacy Note  05/06/2024 Name: Michael [name] MRN: [id number] DOB: 07-29-1956  Referred By: Joshua Debby CROME, MD Reason for referral: Call Attempt #1 and Complex Care Management (Outreach to schedule referral with pharmacist )   Michael [name] is a 68 y.o. year old male who is a primary care patient of Joshua Debby CROME, MD.  Michael [name] was referred to the pharmacist for assistance related to: HTN  A third unsuccessful telephone outreach was attempted today to contact the patient who was referred to the pharmacy team for assistance with medication management. The Population Health team is pleased to engage with this patient at any time in the future upon receipt of referral and should he/she be interested in assistance from the Population Health team.  Michael [name], CMA [facility]  [facility], [facility] Guide Direct Dial: [phone]  Fax: [phone] Website: [email or web address]

## 2024-05-09 DIAGNOSIS — G2582 Stiff-man syndrome: Secondary | ICD-10-CM | POA: Diagnosis not present

## 2024-05-11 ENCOUNTER — Inpatient Hospital Stay: Payer: Medicare Other | Attending: Hematology and Oncology

## 2024-05-11 ENCOUNTER — Other Ambulatory Visit: Payer: Self-pay | Admitting: Hematology and Oncology

## 2024-05-11 DIAGNOSIS — D472 Monoclonal gammopathy: Secondary | ICD-10-CM | POA: Diagnosis not present

## 2024-05-11 DIAGNOSIS — R718 Other abnormality of red blood cells: Secondary | ICD-10-CM | POA: Insufficient documentation

## 2024-05-11 LAB — IRON AND IRON BINDING CAPACITY (CC-WL,HP ONLY)
Iron: 86 ug/dL (ref 45–182)
Saturation Ratios: 27 % (ref 17.9–39.5)
TIBC: 315 ug/dL (ref 250–450)
UIBC: 229 ug/dL (ref 117–376)

## 2024-05-11 LAB — CBC WITH DIFFERENTIAL/PLATELET
Abs Immature Granulocytes: 0.01 K/uL (ref 0.00–0.07)
Basophils Absolute: 0.1 K/uL (ref 0.0–0.1)
Basophils Relative: 1 %
Eosinophils Absolute: 0.2 K/uL (ref 0.0–0.5)
Eosinophils Relative: 5 %
HCT: 35.5 % — ABNORMAL LOW (ref 39.0–52.0)
Hemoglobin: 12.3 g/dL — ABNORMAL LOW (ref 13.0–17.0)
Immature Granulocytes: 0 %
Lymphocytes Relative: 35 %
Lymphs Abs: 1.5 K/uL (ref 0.7–4.0)
MCH: 24 pg — ABNORMAL LOW (ref 26.0–34.0)
MCHC: 34.6 g/dL (ref 30.0–36.0)
MCV: 69.3 fL — ABNORMAL LOW (ref 80.0–100.0)
Monocytes Absolute: 0.4 K/uL (ref 0.1–1.0)
Monocytes Relative: 9 %
Neutro Abs: 2.1 K/uL (ref 1.7–7.7)
Neutrophils Relative %: 50 %
Platelets: 157 K/uL (ref 150–400)
RBC: 5.12 MIL/uL (ref 4.22–5.81)
RDW: 14.4 % (ref 11.5–15.5)
WBC: 4.3 K/uL (ref 4.0–10.5)
nRBC: 0 % (ref 0.0–0.2)

## 2024-05-11 LAB — COMPREHENSIVE METABOLIC PANEL WITH GFR
ALT: 11 U/L (ref 0–44)
AST: 17 U/L (ref 15–41)
Albumin: 3.4 g/dL — ABNORMAL LOW (ref 3.5–5.0)
Alkaline Phosphatase: 71 U/L (ref 38–126)
Anion gap: 2 — ABNORMAL LOW (ref 5–15)
BUN: 15 mg/dL (ref 8–23)
CO2: 29 mmol/L (ref 22–32)
Calcium: 8.8 mg/dL — ABNORMAL LOW (ref 8.9–10.3)
Chloride: 107 mmol/L (ref 98–111)
Creatinine, Ser: 0.76 mg/dL (ref 0.61–1.24)
GFR, Estimated: >60 mL/min (ref >60)
Glucose, Bld: 91 mg/dL (ref 70–99)
Potassium: 4.1 mmol/L (ref 3.5–5.1)
Sodium: 138 mmol/L (ref 135–145)
Total Bilirubin: 0.6 mg/dL (ref 0.0–1.2)
Total Protein: 8.2 g/dL — ABNORMAL HIGH (ref 6.5–8.1)

## 2024-05-11 LAB — FERRITIN: Ferritin: 125 ng/mL (ref 24–336)

## 2024-05-12 ENCOUNTER — Telehealth: Payer: Self-pay | Admitting: Neurology

## 2024-05-12 LAB — KAPPA/LAMBDA LIGHT CHAINS
Kappa free light chain: 28.8 mg/L — ABNORMAL HIGH (ref 3.3–19.4)
Kappa, lambda light chain ratio: 1.96 — ABNORMAL HIGH (ref 0.26–1.65)
Lambda free light chains: 14.7 mg/L (ref 5.7–26.3)

## 2024-05-12 NOTE — Telephone Encounter (Signed)
 PT. Is rcvn bill about infusion as this happened previously as a delay in [place]. For treatment. Pt. wants to know if this has happed again or if there is another isssue. Also provided billings phone number

## 2024-05-13 NOTE — Telephone Encounter (Signed)
 Left message on machine for patient to call back.

## 2024-05-14 LAB — MULTIPLE MYELOMA PANEL, SERUM
Albumin SerPl Elph-Mcnc: 3.4 g/dL (ref 2.9–4.4)
Albumin/Glob SerPl: 0.8 (ref 0.7–1.7)
Alpha 1: 0.2 g/dL (ref 0.0–0.4)
Alpha2 Glob SerPl Elph-Mcnc: 0.5 g/dL (ref 0.4–1.0)
B-Globulin SerPl Elph-Mcnc: 0.9 g/dL (ref 0.7–1.3)
Gamma Glob SerPl Elph-Mcnc: 3.2 g/dL — ABNORMAL HIGH (ref 0.4–1.8)
Globulin, Total: 4.7 g/dL — ABNORMAL HIGH (ref 2.2–3.9)
IgA: 292 mg/dL (ref 61–437)
IgG (Immunoglobin G), Serum: 3591 mg/dL — ABNORMAL HIGH (ref 603–1613)
IgM (Immunoglobulin M), Srm: 52 mg/dL (ref 20–172)
M Protein SerPl Elph-Mcnc: 1.2 g/dL — ABNORMAL HIGH
Total Protein ELP: 8.1 g/dL (ref 6.0–8.5)

## 2024-05-15 NOTE — Telephone Encounter (Signed)
 Called pt and it is a statement from his insurance for a denial letter he did not have the note with him so he will bring it to the office on Monday. I emailed Delon and she reports; He's more than likely looking at an EOB from his insurance company.  He does not have any outstanding balances with us .  It appears he has an $80 copay per dispense.  If he is unable to comfortably afford that, we can offer a financial hardship application and waive his copays if needed.

## 2024-05-21 ENCOUNTER — Encounter: Payer: Self-pay | Admitting: Hematology and Oncology

## 2024-05-21 ENCOUNTER — Inpatient Hospital Stay (HOSPITAL_BASED_OUTPATIENT_CLINIC_OR_DEPARTMENT_OTHER): Payer: Medicare Other | Admitting: Hematology and Oncology

## 2024-05-21 VITALS — BP 154/84 | HR 53 | Temp 99.3°F | Resp 18 | Ht 75.0 in | Wt 227.4 lb

## 2024-05-21 DIAGNOSIS — D472 Monoclonal gammopathy: Secondary | ICD-10-CM

## 2024-05-21 DIAGNOSIS — R718 Other abnormality of red blood cells: Secondary | ICD-10-CM

## 2024-05-21 NOTE — Progress Notes (Signed)
 [facility] OFFICE PROGRESS NOTE  Patient Care Team: Joshua Debby CROME, MD as PCP - General (Internal Medicine) Nahser, Aleene PARAS, MD (Inactive) as PCP - Cardiology (Cardiology) Leigh Venetia CROME, MD as Consulting Physician (Neurology)  ASSESSMENT & PLAN:  Assessment & Plan MGUS (monoclonal gammopathy of unknown significance) I have reviewed his myeloma panel with the patient Overall, his M protein appears to be elevated slightly compared to his blood work 6 months ago His IgG level is high because he has been receiving IVIG at home However, his light chains are just borderline elevated without associated renal dysfunction or hypercalcemia The patient is mildly anemic but not symptomatic I recommend close monitoring and follow-up in 6 months RBC microcytosis I suspect he might have thalassemia Observe only The patient told me he has sickle cell trait as well We discussed risk and benefits of ordering hemoglobinopathy evaluation and he agreed  Orders Placed This Encounter  Procedures   Hgb Fractionation Cascade    Standing Status:   Future    Expiration Date:   05/21/2025   Alpha-Thalassemia Analysis    Standing Status:   Future    Expiration Date:   05/21/2025    Indication:   microcytic anemmia     INTERVAL HISTORY: he returns for surveillance follow-up for diagnosis of MGUS Patient denies recurrent infection or bone pain He is undergoing physical therapy that helps with his balance We reviewed recent CBC, CMP and myeloma panel results  PHYSICAL EXAMINATION: ECOG PERFORMANCE STATUS: 0 - Asymptomatic  Vitals:   05/21/24 1224  BP: (!) 154/84  Pulse: (!) 53  Resp: 18  Temp: 99.3 F (37.4 C)  SpO2: 99%

## 2024-05-21 NOTE — Assessment & Plan Note (Addendum)
 I have reviewed his myeloma panel with the patient Overall, his M protein appears to be elevated slightly compared to his blood work 6 months ago His IgG level is high because he has been receiving IVIG at home However, his light chains are just borderline elevated without associated renal dysfunction or hypercalcemia The patient is mildly anemic but not symptomatic I recommend close monitoring and follow-up in 6 months

## 2024-05-21 NOTE — Assessment & Plan Note (Addendum)
 I suspect he might have thalassemia Observe only The patient told me he has sickle cell trait as well We discussed risk and benefits of ordering hemoglobinopathy evaluation and he agreed

## 2024-06-11 DIAGNOSIS — K080 Exfoliation of teeth due to systemic causes: Secondary | ICD-10-CM | POA: Diagnosis not present

## 2024-06-13 DIAGNOSIS — G2582 Stiff-man syndrome: Secondary | ICD-10-CM | POA: Diagnosis not present

## 2024-07-11 DIAGNOSIS — G2582 Stiff-man syndrome: Secondary | ICD-10-CM | POA: Diagnosis not present

## 2024-08-03 ENCOUNTER — Ambulatory Visit: Admitting: Family Medicine

## 2024-08-03 ENCOUNTER — Emergency Department (HOSPITAL_BASED_OUTPATIENT_CLINIC_OR_DEPARTMENT_OTHER)

## 2024-08-03 ENCOUNTER — Emergency Department (HOSPITAL_BASED_OUTPATIENT_CLINIC_OR_DEPARTMENT_OTHER): Admitting: Radiology

## 2024-08-03 ENCOUNTER — Encounter (HOSPITAL_BASED_OUTPATIENT_CLINIC_OR_DEPARTMENT_OTHER): Payer: Self-pay | Admitting: Urology

## 2024-08-03 ENCOUNTER — Emergency Department (HOSPITAL_BASED_OUTPATIENT_CLINIC_OR_DEPARTMENT_OTHER)
Admission: EM | Admit: 2024-08-03 | Discharge: 2024-08-03 | Disposition: A | Discharge status: Home / Self Care | Attending: Emergency Medicine | Admitting: Emergency Medicine

## 2024-08-03 ENCOUNTER — Other Ambulatory Visit: Payer: Self-pay

## 2024-08-03 DIAGNOSIS — S199XXA Unspecified injury of neck, initial encounter: Secondary | ICD-10-CM | POA: Diagnosis not present

## 2024-08-03 DIAGNOSIS — I6782 Cerebral ischemia: Secondary | ICD-10-CM | POA: Diagnosis not present

## 2024-08-03 DIAGNOSIS — W01198A Fall on same level from slipping, tripping and stumbling with subsequent striking against other object, initial encounter: Secondary | ICD-10-CM | POA: Insufficient documentation

## 2024-08-03 DIAGNOSIS — S0993XA Unspecified injury of face, initial encounter: Secondary | ICD-10-CM | POA: Diagnosis not present

## 2024-08-03 DIAGNOSIS — S01111A Laceration without foreign body of right eyelid and periocular area, initial encounter: Secondary | ICD-10-CM

## 2024-08-03 DIAGNOSIS — R079 Chest pain, unspecified: Secondary | ICD-10-CM | POA: Diagnosis not present

## 2024-08-03 DIAGNOSIS — Y92828 Other wilderness area as the place of occurrence of the external cause: Secondary | ICD-10-CM | POA: Diagnosis not present

## 2024-08-03 DIAGNOSIS — S0990XA Unspecified injury of head, initial encounter: Secondary | ICD-10-CM

## 2024-08-03 DIAGNOSIS — Y9301 Activity, walking, marching and hiking: Secondary | ICD-10-CM | POA: Diagnosis not present

## 2024-08-03 DIAGNOSIS — Z043 Encounter for examination and observation following other accident: Secondary | ICD-10-CM | POA: Diagnosis not present

## 2024-08-03 DIAGNOSIS — S0181XA Laceration without foreign body of other part of head, initial encounter: Secondary | ICD-10-CM | POA: Diagnosis not present

## 2024-08-03 DIAGNOSIS — W19XXXA Unspecified fall, initial encounter: Secondary | ICD-10-CM

## 2024-08-03 MED ORDER — LIDOCAINE-EPINEPHRINE (PF) 2 %-1:200000 IJ SOLN
INTRAMUSCULAR | Status: AC
  Administered 2024-08-03: 20 mL
  Filled 2024-08-03: qty 20

## 2024-08-03 MED ORDER — LIDOCAINE-EPINEPHRINE-TETRACAINE (LET) TOPICAL GEL
3.0000 mL | Freq: Once | TOPICAL | Status: AC
  Administered 2024-08-03: 3 mL via TOPICAL
  Filled 2024-08-03: qty 3

## 2024-08-03 MED ORDER — LIDOCAINE-EPINEPHRINE 2 %-1:100000 IJ SOLN: 20.0000 mL | Freq: Once | INTRAMUSCULAR | Status: DC

## 2024-08-03 NOTE — ED Provider Notes (Signed)
 [place] [facility]ENC[facility] DEPARTMENT AT [facility] Provider Note   CSN: [id number] Arrival date & time: 08/03/24  1421     Patient presents with: Fall and Laceration   Michael [name] is a 68 y.o. 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  Patient reports that he was outside fertilizing his long when he got tripped over his boots fell onto his right side and hit his head.  Patient states that the next thing he remembers was sitting in a chair inside of his house and he is not very sure how he got from his outside yard into his house.  He has a laceration to the right temple.  He denies any severe headache.  He is complaining of some chest pain on the right side in his rib cage.  He denies any severe pain with shortness of breath.  He has no other complaints at this time.   \LAST tdap on 04/23/2024 UTD/   Fall  Laceration      Prior to Admission medications   Medication Sig Start Date End Date Taking? Authorizing Provider  Cholecalciferol  50 MCG (2000 UT) TABS Take 1 tablet (2,000 Units total) by mouth daily. 01/24/24   Joshua Debby CROME, MD  diazepam  (VALIUM ) 5 MG tablet TAKE 1 TABLET(5 MG) BY MOUTH EVERY 6 HOURS AS NEEDED FOR ANXIETY 05/05/24   Leigh Venetia CROME, MD  GAMUNEX-C  5 GM/50ML SOLN  10/16/23   [provider]  GARLIC PO Take by mouth daily.    [provider]  rosuvastatin  (CRESTOR ) 10 MG tablet Take 1 tablet (10 mg total) by mouth daily. 04/23/24   Joshua Debby CROME, MD  triamterene -hydrochlorothiazide (DYAZIDE) 37.5-25 MG capsule Take 1 each (1 capsule total) by mouth daily. 04/23/24   Joshua Debby CROME, MD  zinc  gluconate 50 MG tablet Take 1 tablet (50 mg total) by mouth daily. 01/28/24   Joshua Debby CROME, MD    Allergies: Patient has no known allergies.    Review of Systems  Updated Vital Signs BP (!) 189/95   Pulse (!) 59   Temp 99.4 F (37.4 C) (Oral)   Resp 16   Ht 6' 3 (1.905 m)   Wt 103 kg   SpO2 96%    BMI 28.38 kg/m   Physical Exam Vitals and nursing note reviewed.  Constitutional:      General: He is not in acute distress.    Appearance: He is well-developed. He is not diaphoretic.  HENT:     Head: Normocephalic and atraumatic.   Eyes:     General: No scleral icterus.    Conjunctiva/sclera: Conjunctivae normal.  Cardiovascular:     Rate and Rhythm: Normal rate and regular rhythm.     Heart sounds: Normal heart sounds.  Pulmonary:     Effort: Pulmonary effort is normal. No respiratory distress.     Breath sounds: Normal breath sounds.  Chest:     Chest wall: No lacerations, tenderness or crepitus.  Abdominal:     Palpations: Abdomen is soft.     Tenderness: There is no abdominal tenderness.  Musculoskeletal:     Cervical back: Normal range of motion and neck supple.  Skin:    General: Skin is warm and dry.  Neurological:     Mental Status: He is alert.  Psychiatric:        Behavior: Behavior normal.     (all labs ordered are listed, but only abnormal results are displayed) Labs  Reviewed - No data to display  EKG: None  Radiology: No results found.   .Laceration Repair  Date/Time: 08/03/2024 6:14 PM  Performed by: Arloa Chroman, PA-C Authorized by: Arloa Chroman, PA-C   Consent:    Consent obtained:  Verbal   Risks discussed:  Need for additional repair Universal protocol:    Patient identity confirmed:  Arm band Anesthesia:    Anesthesia method:  Topical application and local infiltration   Topical anesthetic:  LET   Local anesthetic:  Lidocaine 1% w/o epi Laceration details:    Location:  Face   Length (cm):  3 Pre-procedure details:    Preparation:  Patient was prepped and draped in usual sterile fashion Exploration:    Hemostasis achieved with:  LET   Wound exploration: wound explored through full range of motion and entire depth of wound visualized   Treatment:    Area cleansed with:  Povidone-iodine and Shur-Clens   Irrigation  solution:  Sterile saline Skin repair:    Repair method:  Sutures   Suture size:  5-0   Suture material:  Prolene   Suture technique:  Simple interrupted   Number of sutures:  3 Repair type:    Repair type:  Simple Post-procedure details:    Dressing:  Non-adherent dressing   Procedure completion:  Tolerated well, no immediate complications    Medications Ordered in the ED - No data to display  Clinical Course as of 08/03/24 1813  Mon Aug 03, 2024  1743 DG Chest 2 View [AH]  1743 CT Head Wo Contrast [AH]  1743 CT Cervical Spine Wo Contrast Visualized interpreted two-view chest x-ray, CT head and CT C-spine all of which are negative for acute finding. [AH]    Clinical Course User Index [AH] Arloa Chroman, PA-C                                 Medical Decision Making Amount and/or Complexity of Data Reviewed Radiology: ordered. Decision-making details documented in ED Course.  Risk Prescription drug management.   Patient here with fall and loss of consciousness.  He is back to baseline.  I ordered visualized and interpreted CT head and C-spine.  No acute findings.  I repaired laceration side of his face.  Discharged with specific wound care and return precautions as well as length of time he will need to have it removed.  Discussed outpatient follow-up and return precautions appropriate discharge at this time.     Final diagnoses:  None    ED Discharge Orders     None          Arloa Chroman, PA-C 08/03/24 1832    Lenor Hollering, MD 08/04/24 0002

## 2024-08-03 NOTE — ED Triage Notes (Addendum)
 Pt states had mechanical fall around 13 when walking on an angled hill  States blacked out a little when hitting head and woke up in chair in garage   Approx 2 cm lac gaping by right eye, bleeding controlled    Tdap unknown  NO THINNERS

## 2024-08-03 NOTE — Discharge Instructions (Signed)
WOUND CARE Please have your stitches/staples removed in 7days or sooner if you have concerns. You may do this at any available urgent care or at your primary care doctor's office.  Keep area clean and dry for 24 hours. Do not remove bandage, if applied.  After 24 hours, remove bandage and wash wound gently with mild soap and warm water. Reapply a new bandage after cleaning wound, if directed.  Continue daily cleansing with soap and water until stitches/staples are removed.  Do not apply any ointments or creams to the wound while stitches/staples are in place, as this may cause delayed healing.  Seek medical careif you experience any of the following signs of infection: Swelling, redness, pus drainage, streaking, fever >101.0 F  Seek care if you experience excessive bleeding that does not stop after 15-20 minutes of constant, firm pressure.   

## 2024-08-05 NOTE — Progress Notes (Signed)
 I saw Michael [name] in neurology clinic on 08/19/24 in follow up for stiff person's syndrome.  HPI: Michael [name] is a 68 y.o. year old male with a history of degenerative disc disease s/p L5-S1 surgery, HLD, HTN, prostate cancer who we last saw on 02/13/24.  To briefly review: Initial consult 05/03/23: Patient's symptoms started in 2020. He was having abdominal pain and a colonscopy for this. He mentioned that he noticed symptoms in core with weakness (not able to sit up well). Sneezing and coughing was painful as well. He then noticed problems in his legs a year later. While sitting, his legs freeze. He cannot stand well. He has to feel his legs under him and start with small steps. Once he gets going, he feels he can move better. He mentions that is someone sneaks up on her or scares him, he will freeze up again. He walks with a cane. He has fallen 3 times since the beginning of 2024. He last feel about 1 week ago while trying to do lawn work. He was holding on the the mower for stability. He saw an insect that scared him, his left leg locked, then he fell.    He endorses some numbness and soreness in left hamstring area. He endorses muscle spasms (mostly in the left leg) usually when sitting. He denies twitching. He denies changes in bowel or bladder, including incontinence. He denies any symptoms in his upper extremities.   Patient had an EMG on 04/20/22 showed residuals of L5-S1 radiculopathy per report.   Patient went to ED for symptoms on 02/20/23. Electrolytes and CK were unremarkable.   Patient is on baclofen  5 mg at bedtime (has been weaning himself off of this) and gabapentin  300 mg TID (only taking BID), but has not taken anything in 4 days in preparation for this appointment. These medications do help (gabapentin  more).   Any change in urine color, especially after exertion/physical activity? No   The patient denies symptoms suggestive of oculobulbar weakness including  diplopia, ptosis, dysphagia, poor saliva control, dysarthria/dysphonia, impaired mastication, facial weakness/droop. He does mention that when he swallows, he feels like he is swallowing a bubble and will cause gas.   There are no neuromuscular respiratory weakness symptoms, particularly orthopnea>dyspnea.    Pseudobulbar affect is absent.   The patient has not noticed any recent skin rashes nor does he report any constitutional symptoms like fever, night sweats, anorexia or unintentional weight loss.   EtOH use: None  Restrictive diet? Plant based diet Family history of neuropathy/myopathy/NM disease? No   Of note, there is note in patient's chart about prostate cancer. This is just being monitored currently per patient. Per patient, he does think he has cancer.   Mother has moved in with patient to help him out.   05/29/23: Overall, patient is similar to prior. He had another fall in the last week.   Initial labs on 05/03/23 were significant for an IgG kappa monoclonal antibody. I referred patient to hematology for this. This consultation is scheduled 06/10/23.   EMG on 05/14/23 showed an active left S1 radiculopathy (similar to prior EMG), but I also noticed spasms or jerking whenever I would touch the patient. It was difficult for patient to relax any muscles, including with antagonist contraction (co-contraction suspected). There was no other significant abnormalities. Given this, I added on some labs including GAD65 due to a suspicion for stiff person syndrome. GAD65 antibodies were positive (> 250 with normal < 5).  Patient is taking gabapentin  as needed. It makes him drowsy. Patient is not taking baclofen  currently.   08/21/23: Patient states he is doing much better. He is doing PT. He has not fallen recently. He feels like his core strength is better. He also feels looser and able to move better. He is still taking Valium  5 mg 3-4 times daily.   Patient was seen by hematology and is  being monitored for MGUS.   CT chest/abd/pelvis on 06/17/23 showed no evidence of malignancy.   I decided to start IVIg monthly at this visit.   11/21/23: Patient received his loading dose of IVIg at the end of 08/2023 and had maintenance dosing 09/2023, and 11/16/23. He is happy with the home infusion company. He continues to take valium  5 mg, but maybe only taking once a day at night. It does make him sleepy, so he cut back on it. He will take another dose if he has to go out into public.   He continues to feel better and less stiff. He thinks his walking is better. When it is cold, he finds the muscles shiver more and then he'll lose control of his legs.    He has not had any falls since 07/2023.  02/13/24: Patient is still feeling improved compared to prior. He continues to take Valium . He did have an issue where he could not get the medication for 1.5 weeks. He had increased tightness and spasms while off the medication. He is currently taking it BID usually (morning and night).   Patient had IVIg on 12/21/23 and 01/18/24. His next IVIg is schedule for 02/14/24. He is very happy with the home infusion company.   Patient is still doing PT.   He has not had any recent falls.   He is seeing hematology again in 04/2024.  Most recent Assessment and Plan (02/13/24): This is Michael [name], a 69 y.o. male with muscle spasms and tightness in trunk and lower extremities with exaggerated startle, imbalance, and falls. His EMG showed evidence of an active left S1 radiculopathy, but more importantly, agonist/antagonist co-contraction. His GAD65 antibodies are also very elevated. 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 His CT C/A/P showed no evidence of malignancy. IFE did show M protein for which he is following with hematology for MGUS.    Overall, patient is doing very well with IVIg, decreasing his valium  to twice daily from 4 times daily  prior. His stiffness is much improved.   Plan: -Continue Zinc  supplementation 50 mg daily -Continue Vit D supplementation -Continue Valium  5 mg up to 4 times daily -Continue monthly IVIg -Follow up with hematology for monoclonal gammopathy as planned  Since their last visit: He fell outside onto his right side and hit his head while working in the yard (08/03/24). He appears to have LOC or missed some time, not knowing how he got inside. He had a laceration to his right temple. CT head and cervical spine were unremarkable.  He has also had his teeth removed since his last visit which has helped some of his dental issues.  He uses his gauge as his ability to do yard work. He was previously taking 2 days to do his yard work. He thinks he feels better and now can do the yard work in 1 day. He continues to improve in terms of stiffness. He thought PT really helped range of motion and flexibility. He continues to do PT once a  month.  He had his last IVIg was on 08/07/24. This is still going well. The next appointment for IVIg is 09/05/24. He no longer takes Valium  5 mg every day. He will take it if he feels tight or about 2 hours prior to doing yard work. He thinks he takes it about twice a week.    MEDICATIONS:  Outpatient Encounter Medications as of 08/19/2024  Medication Sig   Cholecalciferol  50 MCG (2000 UT) TABS Take 1 tablet (2,000 Units total) by mouth daily.   diazepam  (VALIUM ) 5 MG tablet TAKE 1 TABLET(5 MG) BY MOUTH EVERY 6 HOURS AS NEEDED FOR ANXIETY   GAMUNEX-C  5 GM/50ML SOLN    GARLIC PO Take by mouth daily.   zinc  gluconate 50 MG tablet Take 1 tablet (50 mg total) by mouth daily.   rosuvastatin  (CRESTOR ) 10 MG tablet Take 1 tablet (10 mg total) by mouth daily. (Patient not taking: Reported on 08/19/2024)   triamterene -hydrochlorothiazide (DYAZIDE) 37.5-25 MG capsule Take 1 each (1 capsule total) by mouth daily. (Patient not taking: Reported on 08/19/2024)   No  facility-administered encounter medications on file as of 08/19/2024.    PAST MEDICAL HISTORY: Past Medical History:  Diagnosis Date   Hyperlipidemia    Hypertension    Lumbar disc disease    Prostate cancer (HCC)     PAST SURGICAL HISTORY: Past Surgical History:  Procedure Laterality Date   LUMBAR DISC SURGERY  2014   LUMBAR DISC SURGERY     right ankle fracture  1978   external pinning    ALLERGIES: No Known Allergies  FAMILY HISTORY: Family History  Problem Relation Age of Onset   High blood pressure Mother     SOCIAL HISTORY: Social History   Tobacco Use   Smoking status: Never    Passive exposure: Never   Smokeless tobacco: Never  Vaping Use   Vaping status: Never Used  Substance Use Topics   Alcohol use: Not Currently    Comment: rare   Drug use: No   Social History   Social History Narrative   Right Handed    Lives in a two story home.   Are you right handed or left handed? Right    Are you currently employed ?    What is your current occupation? retired   Do you live at home alone? no   Who lives with you? Mother 2025   What type of home do you live in: 1 story or 2 story? two        Objective:  Vital Signs:  BP (!) 168/74   Pulse (!) 51   Ht 6' 3 (1.905 m)   Wt 226 lb (102.5 kg)   SpO2 98%   BMI 28.25 kg/m   General: General appearance: Awake and alert. No distress. Cooperative with exam.  Skin: No obvious rash or jaundice. HEENT: Atraumatic. Anicteric. Lungs: Non-labored breathing on room air  Heart: Regular Extremities: Peripheral edema in bilateral lower extremities  Musculoskeletal: No obvious joint swelling.  Neurological: Mental Status: Alert. Speech fluent. No pseudobulbar affect Cranial Nerves: CNII: No RAPD. Visual fields intact. CNIII, IV, VI: PERRL. No nystagmus. EOMI. CN V: Facial sensation intact bilaterally to fine touch. CN VII: Facial muscles symmetric and strong. No ptosis at rest. CN VIII: Hears finger  rub well bilaterally. CN IX: No hypophonia. CN X: Palate elevates symmetrically. CN XI: Full strength shoulder shrug bilaterally. CN XII: Tongue protrusion full and midline. No atrophy or fasciculations. No significant dysarthria Motor:  Tone is normal.  Individual muscle group testing (MRC grade out of 5):  Movement     Neck flexion 5    Neck extension 5     Right Left   Shoulder abduction 5 5   Elbow flexion 5 5   Elbow extension 5 5   Finger abduction - FDI 5 5   Finger abduction - ADM 5 5   Finger extension 5 5   Finger distal flexion - 2/3 5 5    Finger distal flexion - 4/5 5 5    Thumb flexion - FPL 5 5   Thumb abduction - APB 5 5    Hip flexion 4+ 4+   Hip extension 5 5   Hip adduction 5 5   Hip abduction 5 5   Knee extension 5 5   Knee flexion 5 5   Dorsiflexion 5 5   Plantarflexion 5 5    Reflexes:  Right Left  Bicep 2+ 2+  Tricep 2+ 2+  BrRad 2+ 2+  Knee 2+ 2+  Ankle 0 0   Sensation: Pinprick: Intact in all extremities Coordination: Intact finger-to- nose-finger bilaterally. Gait: Narrow based, stiff appearing gait.   Lab and Test Review: New results: 05/11/24: CMP unremarkable CBC w/ diff significant for Hb 12.3, MCV 69.3 Ferritin 125 K/L light chains ratio increased to 1.96 MM panel: IFE with IgG kappa monoclonal protein  CT head and cervical spine wo contrast (08/03/24): IMPRESSION: 1. No evidence of acute intracranial abnormality. 2. Mild chronic small vessel ischemic disease. 3. No acute cervical spine fracture or subluxation.  Previously reviewed results: 01/22/24: Lipid panel: tChol 162, LDL 105, TG 57 TSH wnl B12: 1437 Folate wnl B1 wnl HbA1c: 4.4 Zinc  low at 45 Vit D low at 22.34   11/06/23: UPEP/UIFE: no M protein MM panel: IgG kappa monoclonal gammopathy CMP unremarkable CBC w/ diff significant for MCV of 71.1   08/05/23: CBC w/ diff: significant for MCV 66.8 (chronic) CMP unremarkable LDH wnl K/L ratio: 1.95 (mildly  elevated) MM panel: IgG kappa monoclonal protein   05/14/23: Copper  wnl VEGF wnl Kappa/lambda light chains: elevated to 1.79 GAD65: elevated to > 250 international units/mL IA-2 ab: elevated to  335.4 international units/mL Insulin  abs elevated to 0.8 units/mL  02/20/23: CK 115 CBC significant for MCV of 65.9 (chronic) CMP unremarkable   11/22/22: PSA: 10.42 Lipid panel significant for LDL of 116 TSH 5.02 (wnl) [id number]: 5.9   04/05/22: ANA positive at 1:40 ACE wnl Ionized Ca wnl PTH wnl Vit D: 20.42 CRP < 1.0 ESR wnl (15) CCP negative RF negative B12: 1303 Ferritin 25.7    CT lumbar spine and myelogram (11/02/22): FINDINGS: LUMBAR MYELOGRAM FINDINGS:   Unchanged trace retrolisthesis at T12-L1 and L1-L2. No dynamic instability. Small ventral extradural defects from T12-L1 through L4-L5. No spinal canal stenosis or nerve root effacement.   CT LUMBAR MYELOGRAM FINDINGS:   Segmentation: Standard.   Alignment: Unchanged mild levocurvature. Unchanged trace retrolisthesis at T12-L1 and L1-L2.   Vertebrae: No acute fracture or other focal pathologic process. Small L3 bone island.   Conus medullaris and cauda equina: Conus extends to the T12-L1 level. Conus and cauda equina appear normal.   Paraspinal and other soft tissues: Aortoiliac atherosclerotic vascular disease.   Disc levels:   T12-L1: Unchanged small broad-based posterior disc osteophyte complex and mild left facet arthropathy. No stenosis.   L1-L2: Unchanged small shallow right paracentral disc osteophyte complex and mild bilateral facet arthropathy. No stenosis.   L2-L3: Unchanged small  broad-based posterior disc osteophyte complex and mild bilateral facet arthropathy. Unchanged mild right neuroforaminal stenosis. No spinal canal or left neuroforaminal stenosis.   L3-L4: Unchanged small broad-based posterior disc osteophyte complex and bilateral facet arthropathy. Unchanged mild  bilateral neuroforaminal stenosis. No spinal canal stenosis.   L4-L5: Unchanged small broad-based posterior disc osteophyte complex and mild bilateral facet arthropathy. No stenosis.   L5-S1: Prior left hemilaminectomy. Unchanged tiny broad-based posterior disc protrusion and mild-to-moderate bilateral facet arthropathy. No stenosis.   IMPRESSION: 1. Unchanged mild multilevel lumbar spondylosis as described above. No high-grade stenosis or impingement. 2. Unchanged trace retrolisthesis at T12-L1 and L1-L2. No dynamic instability. 3.  Aortic Atherosclerosis (ICD10-I70.0).   MRI lumbar spine wo contrast (09/30/2010): IMPRESSION:  1. Probable left S1 nerve root encroachment at L5-S1, felt to be  due to a combination of a posterolateral disc protrusion and a  probable synovial cyst extending medially from the left facet  joint.  This seems to be the most likely explanation for the  patient's symptoms.  Correlation with level of radicular symptoms  is recommended.  2.  Mild disc bulging and facet disease at the additional levels  with mild inferior foraminal narrowing as detailed above.  There is  the potential for extraforaminal nerve root encroachment on the  left at L2-L3, not definite.  No other significant spinal stenosis  is demonstrated.    EMG (04/20/22 by Dr. Carilyn):     EMG (05/14/23): NCV & EMG Findings: Extensive electrodiagnostic evaluation of the left lower limb with additional nerve conduction studies of the left upper limb and needle examination of the left thoracic paraspinal muscles (T8 and T5 levels) shows: Left sural and superficial peroneal/fibular sensory responses are absent. Left ulnar sensory response shows reduced amplitude (4 V). Left median and radial sensory responses are within normal limits. Left peroneal/fibular (EDB) motor response shows reduced amplitude (1.31 mV). Left peroneal/fibular (TA), tibial (AH), median (APB), and ulnar (ADM) motor  responses are within normal limits. Left H reflex is absent. Chronic motor axon loss changes are seen in the left medial head of gastrocnemius and left short head of biceps femoris muscles, with active denervation changes also seen in the medial head of gastrocnemius muscle. All muscles were difficult to relax, even with antagonist muscle contraction. Lumbosacral paraspinal muscles were deferred due to prior lumbosacral spine surgery.   Impression: This is an abnormal study. The findings are most consistent with the following: Evidence of an active/ongoing intraspinal canal lesion (ie: motor radiculopathy) at the left S1 root or segment, moderate in degree electrically. Evidence of a length dependent, large fiber sensorimotor neuropathy, axon loss in type, mild to moderate in degree electrically. No electrodiagnostic evidence of demyelinating neuropathy. No electrodiagnostic evidence of a left median mononeuropathy at or distal to the wrist (ie: carpal tunnel syndrome).   CT chest/abd/pelvis (06/17/23): FINDINGS: CT CHEST FINDINGS   Cardiovascular: Normal caliber thoracic aorta normal size heart. No significant pericardial effusion/thickening.   Mediastinum/Nodes: No suspicious thyroid  nodule. No pathologically enlarged mediastinal, hilar or axillary lymph nodes. Patulous distal esophagus   Lungs/Pleura: No suspicious pulmonary nodules or masses. Bibasilar atelectasis/scarring. No pleural effusion. No pneumothorax   Musculoskeletal: No aggressive lytic or blastic lesion of bone. Subacute non/minimally displaced left rib fractures.   CT ABDOMEN PELVIS FINDINGS   Hepatobiliary: Unremarkable noncontrast enhanced appearance of the hepatic parenchyma. Gallbladder is unremarkable. No biliary ductal dilation.   Pancreas: No pancreatic ductal dilation or evidence of acute inflammation.   Spleen: No splenomegaly.  Adrenals/Urinary Tract: Bilateral adrenal glands are within  normal limits. No hydronephrosis. No renal, ureteral or bladder calculi. Retroaortic left renal vein. Urinary bladder is unremarkable for degree of distension   Stomach/Bowel: Radiopaque enteric contrast material traverses the rectum. Stomach is unremarkable for degree of distension. No pathologic dilation of small or large bowel. Normal appendix. Moderate volume of formed stool in the colon. Left-sided colonic diverticulosis without findings of acute diverticulitis.   Vascular/Lymphatic: Aortic atherosclerosis. Normal caliber abdominal aorta. Smooth IVC contours. Retroaortic left renal vein. No pathologically enlarged abdominal or pelvic lymph nodes   Reproductive: Enlarged prostate gland.   Other: No significant abdominopelvic free fluid.   Musculoskeletal: No aggressive lytic or blastic lesion of bone. Multilevel degenerative changes spine   IMPRESSION: 1. No acute abnormality in the chest, abdomen or pelvis. 2. No suspicious mass or adenopathy identified in the chest, abdomen or pelvis. 3. Moderate volume of formed stool in the colon. 4. Left-sided colonic diverticulosis without findings of acute diverticulitis. 5. Enlarged prostate gland. 6. Subacute non/minimally displaced left rib fractures. 7.  Aortic Atherosclerosis (ICD10-I70.0).  ASSESSMENT: This is Michael [name], a 68 y.o. male with muscle spasms and tightness in trunk and lower extremities with exaggerated startle, imbalance, and falls. His EMG showed evidence of an active left S1 radiculopathy, but more importantly, agonist/antagonist co-contraction. His GAD65 antibodies are also very elevated. 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 His CT C/A/P showed no evidence of malignancy. IFE did show M protein for which he is following with hematology for MGUS.    Overall, patient is doing very well with IVIg, decreasing his valium  as needed (taking a couple of  times per week). His stiffness and movement is much improved allowing him to stay active.  Plan: -Continue to follow up hematology as planned for MGUS -Continue IVIg monthly: next appointment is 09/05/24 -Continue Valium  5 mg as needed, up to 4 times daily -Continue PT and home exercises -Fall precautions discussed  Return to clinic in 6 months  Venetia Potters, MD

## 2024-08-07 DIAGNOSIS — G2582 Stiff-man syndrome: Secondary | ICD-10-CM | POA: Diagnosis not present

## 2024-08-12 ENCOUNTER — Encounter: Payer: Self-pay | Admitting: Internal Medicine

## 2024-08-12 ENCOUNTER — Ambulatory Visit: Admitting: Internal Medicine

## 2024-08-12 VITALS — BP 140/80 | HR 53 | Temp 98.0°F | Ht 75.0 in | Wt 225.2 lb

## 2024-08-12 DIAGNOSIS — S0190XA Unspecified open wound of unspecified part of head, initial encounter: Secondary | ICD-10-CM | POA: Insufficient documentation

## 2024-08-12 DIAGNOSIS — S060X1D Concussion with loss of consciousness of 30 minutes or less, subsequent encounter: Secondary | ICD-10-CM | POA: Diagnosis not present

## 2024-08-12 DIAGNOSIS — W19XXXD Unspecified fall, subsequent encounter: Secondary | ICD-10-CM | POA: Diagnosis not present

## 2024-08-12 DIAGNOSIS — S0181XD Laceration without foreign body of other part of head, subsequent encounter: Secondary | ICD-10-CM | POA: Diagnosis not present

## 2024-08-12 DIAGNOSIS — S060XAA Concussion with loss of consciousness status unknown, initial encounter: Secondary | ICD-10-CM | POA: Insufficient documentation

## 2024-08-12 DIAGNOSIS — W19XXXA Unspecified fall, initial encounter: Secondary | ICD-10-CM | POA: Insufficient documentation

## 2024-08-12 DIAGNOSIS — G2582 Stiff-man syndrome: Secondary | ICD-10-CM

## 2024-08-12 NOTE — Assessment & Plan Note (Signed)
 Mechanical fall. He was outside fertilizing his long when he got tripped over his boots fell onto his right side and hit his head.  Patient states that the next thing he remembers was sitting in a chair inside of his house and he is not very sure how he got from his outside yard into his house.  Probable concussion w/LOC No HAs, no n/v, no seizure Info provided

## 2024-08-12 NOTE — Assessment & Plan Note (Signed)
 Seeing Dr Leigh. Stable

## 2024-08-12 NOTE — Assessment & Plan Note (Signed)
3 sutures were removed 

## 2024-08-12 NOTE — Assessment & Plan Note (Signed)
 Mechanical fall. He was outside fertilizing his long when he got tripped over his boots fell onto his right side and hit his head.  Patient states that the next thing he remembers was sitting in a chair inside of his house and he is not very sure how he got from his outside yard into his house.  Probable concussion w/LOC

## 2024-08-12 NOTE — Progress Notes (Signed)
 Subjective:  Patient ID: Michael [name], male    DOB: Mar 01, 1956  Age: 68 y.o. MRN: [id number]  CC: No chief complaint on file.   HPI Michael [name] presents for post-ER visit on 08/03/2024:   L temple wound - healed   Per hx: Michael [name] is a 68 y.o. 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  Patient reports that he was outside fertilizing his long when he got tripped over his boots fell onto his right side and hit his head.  Patient states that the next thing he remembers was sitting in a chair inside of his house and he is not very sure how he got from his outside yard into his house.  He has a laceration to the right temple.  He denies any severe headache.  He is complaining of some chest pain on the right side in his rib cage.  He denies any severe pain with shortness of breath.  He has no other complaints at this time.   Patient here with fall and loss of consciousness.  He is back to baseline.  I ordered visualized and interpreted CT head and C-spine.  No acute findings.  I repaired laceration side of his face.  Discharged with specific wound care and return precautions as well as length of time he will need to have it removed.  Discussed outpatient follow-up and return precautions appropriate discharge at this time.  CT head and neck were   Outpatient Medications Prior to Visit  Medication Sig Dispense Refill   Cholecalciferol  50 MCG (2000 UT) TABS Take 1 tablet (2,000 Units total) by mouth daily. 90 tablet 1   diazepam  (VALIUM ) 5 MG tablet TAKE 1 TABLET(5 MG) BY MOUTH EVERY 6 HOURS AS NEEDED FOR ANXIETY 30 tablet 5   GAMUNEX-C  5 GM/50ML SOLN      GARLIC PO Take by mouth daily.     rosuvastatin  (CRESTOR ) 10 MG tablet Take 1 tablet (10 mg total) by mouth daily. 90 tablet 1   triamterene -hydrochlorothiazide (DYAZIDE) 37.5-25 MG capsule Take 1 each (1 capsule total) by mouth daily. 90 capsule 0   zinc  gluconate 50 MG tablet  Take 1 tablet (50 mg total) by mouth daily. 90 tablet 1   No facility-administered medications prior to visit.    ROS: Review of Systems  Constitutional:  Positive for fatigue. Negative for appetite change and unexpected weight change.  HENT:  Negative for congestion, nosebleeds, sneezing, sore throat and trouble swallowing.   Eyes:  Negative for itching and visual disturbance.  Respiratory:  Negative for cough.   Cardiovascular:  Negative for chest pain, palpitations and leg swelling.  Gastrointestinal:  Negative for abdominal distention, blood in stool, diarrhea and nausea.  Genitourinary:  Negative for frequency and hematuria.  Musculoskeletal:  Positive for gait problem. Negative for back pain, joint swelling and neck pain.  Skin:  Positive for color change. Negative for rash.  Neurological:  Negative for dizziness, tremors, speech difficulty, weakness, light-headedness and headaches.  Hematological:  Does not bruise/bleed easily.  Psychiatric/Behavioral:  Negative for agitation, confusion, dysphoric mood, sleep disturbance and suicidal ideas. The patient is not nervous/anxious.     Objective:  BP (!) 140/80   Pulse (!) 53   Temp 98 F (36.7 C) (Temporal)   Ht 6' 3 (1.905 m)   Wt 225 lb 4 oz (102.2 kg)   SpO2 98%   BMI 28.15 kg/m   BP Readings from Last 3  Encounters:  08/12/24 (!) 140/80  08/03/24 (!) 162/86  05/21/24 (!) 154/84    Wt Readings from Last 3 Encounters:  08/12/24 225 lb 4 oz (102.2 kg)  08/03/24 227 lb 1.2 oz (103 kg)  05/21/24 227 lb 6.4 oz (103.1 kg)    Physical Exam Constitutional:      General: He is not in acute distress.    Appearance: Normal appearance. He is well-developed.     Comments: NAD  Eyes:     Conjunctiva/sclera: Conjunctivae normal.     Pupils: Pupils are equal, round, and reactive to light.  Neck:     Thyroid : No thyromegaly.     Vascular: No JVD.  Cardiovascular:     Rate and Rhythm: Normal rate and regular rhythm.      Heart sounds: Normal heart sounds. No murmur heard.    No friction rub. No gallop.  Pulmonary:     Effort: Pulmonary effort is normal. No respiratory distress.     Breath sounds: Normal breath sounds. No wheezing or rales.  Chest:     Chest wall: No tenderness.  Abdominal:     General: Bowel sounds are normal. There is no distension.     Palpations: Abdomen is soft. There is no mass.     Tenderness: There is no abdominal tenderness. There is no guarding or rebound.  Musculoskeletal:        General: Tenderness present. No swelling. Normal range of motion.     Cervical back: Normal range of motion.     Right lower leg: Edema present.     Left lower leg: No edema.  Lymphadenopathy:     Cervical: No cervical adenopathy.  Skin:    General: Skin is warm and dry.     Findings: No rash.  Neurological:     Mental Status: He is alert and oriented to person, place, and time.     Cranial Nerves: No cranial nerve deficit.     Motor: No weakness or abnormal muscle tone.     Coordination: Coordination abnormal.     Gait: Gait abnormal.     Deep Tendon Reflexes: Reflexes are normal and symmetric.  Psychiatric:        Behavior: Behavior normal.        Thought Content: Thought content normal.        Judgment: Judgment normal.    R temple and eyebrow wound is healed; 3 sutures were removed Spastic legs Scaly skin    A total time of 45 minutes was spent preparing to see the patient, reviewing tests, x-rays, operative reports and other medical records, removing sutures.  Also, obtaining history and performing comprehensive physical exam.  Additionally, counseling the patient regarding the above listed issues - concussion.   Finally, documenting clinical information in the health records, coordination of care, educating the patient. It is a complex case.   Lab Results  Component Value Date   WBC 4.3 05/11/2024   HGB 12.3 (L) 05/11/2024   HCT 35.5 (L) 05/11/2024   PLT 157 05/11/2024    GLUCOSE 91 05/11/2024   CHOL 162 01/22/2024   TRIG 57.0 01/22/2024   HDL 45.50 01/22/2024   LDLCALC 105 (H) 01/22/2024   ALT 11 05/11/2024   AST 17 05/11/2024   NA 138 05/11/2024   K 4.1 05/11/2024   CL 107 05/11/2024   CREATININE 0.76 05/11/2024   BUN 15 05/11/2024   CO2 29 05/11/2024   TSH 4.59 01/22/2024   PSA 5.75 (H) 04/23/2024  HGBA1C 4.4 (L) 01/22/2024    DG Chest 2 View Result Date: 08/03/2024 CLINICAL DATA:  Fall. EXAM: CHEST - 2 VIEW COMPARISON:  Chest radiograph dated 09/04/2023. FINDINGS: No focal consolidation, pleural effusion or pneumothorax. The cardiac silhouette is within normal limits. No acute osseous pathology. Old healed right rib fractures. IMPRESSION: No active cardiopulmonary disease. Electronically Signed   By: Vanetta Chou M.D.   On: 08/03/2024 17:26   CT Head Wo Contrast Result Date: 08/03/2024 CLINICAL DATA:  Head and neck trauma (Age >= 65y).  Mechanical fall. EXAM: CT HEAD WITHOUT CONTRAST CT CERVICAL SPINE WITHOUT CONTRAST TECHNIQUE: Multidetector CT imaging of the head and cervical spine was performed following the standard protocol without intravenous contrast. Multiplanar CT image reconstructions of the cervical spine were also generated. RADIATION DOSE REDUCTION: This exam was performed according to the departmental dose-optimization program which includes automated exposure control, adjustment of the mA and/or kV according to patient size and/or use of iterative reconstruction technique. COMPARISON:  Head CT 11/08/2011 FINDINGS: CT HEAD FINDINGS Brain: There is no evidence of an acute infarct, intracranial hemorrhage, mass, midline shift, or extra-axial fluid collection. Cerebral volume is within normal limits for age. The ventricles are normal in size. Cerebral white matter hypodensities are nonspecific but compatible with mild chronic small vessel ischemic disease. Vascular: No hyperdense vessel. Skull: No acute fracture or suspicious lesion.  Sinuses/Orbits: Mild mucosal thickening in the included paranasal sinuses. Clear mastoid air cells. Soft tissue swelling and laceration lateral to the right orbit. Other: None. CT CERVICAL SPINE FINDINGS Alignment: Cervical spine straightening.  No listhesis. Skull base and vertebrae: No acute fracture or suspicious lesion. Soft tissues and spinal canal: No prevertebral fluid or swelling. No visible canal hematoma. Disc levels: Overall mild multilevel cervical disc degeneration, greatest at C5-6 where disc bulging and uncovertebral spurring result in mild right and moderate left neural foraminal stenosis. No evidence of high-grade spinal canal stenosis. Upper chest: Clear lung apices. Other: None. IMPRESSION: 1. No evidence of acute intracranial abnormality. 2. Mild chronic small vessel ischemic disease. 3. No acute cervical spine fracture or subluxation. Electronically Signed   By: Dasie Hamburg M.D.   On: 08/03/2024 17:22   CT Cervical Spine Wo Contrast Result Date: 08/03/2024 CLINICAL DATA:  Head and neck trauma (Age >= 65y).  Mechanical fall. EXAM: CT HEAD WITHOUT CONTRAST CT CERVICAL SPINE WITHOUT CONTRAST TECHNIQUE: Multidetector CT imaging of the head and cervical spine was performed following the standard protocol without intravenous contrast. Multiplanar CT image reconstructions of the cervical spine were also generated. RADIATION DOSE REDUCTION: This exam was performed according to the departmental dose-optimization program which includes automated exposure control, adjustment of the mA and/or kV according to patient size and/or use of iterative reconstruction technique. COMPARISON:  Head CT 11/08/2011 FINDINGS: CT HEAD FINDINGS Brain: There is no evidence of an acute infarct, intracranial hemorrhage, mass, midline shift, or extra-axial fluid collection. Cerebral volume is within normal limits for age. The ventricles are normal in size. Cerebral white matter hypodensities are nonspecific but compatible  with mild chronic small vessel ischemic disease. Vascular: No hyperdense vessel. Skull: No acute fracture or suspicious lesion. Sinuses/Orbits: Mild mucosal thickening in the included paranasal sinuses. Clear mastoid air cells. Soft tissue swelling and laceration lateral to the right orbit. Other: None. CT CERVICAL SPINE FINDINGS Alignment: Cervical spine straightening.  No listhesis. Skull base and vertebrae: No acute fracture or suspicious lesion. Soft tissues and spinal canal: No prevertebral fluid or swelling. No visible canal hematoma. Disc  levels: Overall mild multilevel cervical disc degeneration, greatest at C5-6 where disc bulging and uncovertebral spurring result in mild right and moderate left neural foraminal stenosis. No evidence of high-grade spinal canal stenosis. Upper chest: Clear lung apices. Other: None. IMPRESSION: 1. No evidence of acute intracranial abnormality. 2. Mild chronic small vessel ischemic disease. 3. No acute cervical spine fracture or subluxation. Electronically Signed   By: Dasie Hamburg M.D.   On: 08/03/2024 17:22    Assessment & Plan:   Problem List Items Addressed This Visit     Concussion   Mechanical fall. He was outside fertilizing his long when he got tripped over his boots fell onto his right side and hit his head.  Patient states that the next thing he remembers was sitting in a chair inside of his house and he is not very sure how he got from his outside yard into his house.  Probable concussion w/LOC No HAs, no n/v, no seizure Info provided      Fall - Primary   Mechanical fall. He was outside fertilizing his long when he got tripped over his boots fell onto his right side and hit his head.  Patient states that the next thing he remembers was sitting in a chair inside of his house and he is not very sure how he got from his outside yard into his house.  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 Stable      Wound, open, head    3 sutures were removed         No orders of the defined types were placed in this encounter.     Follow-up: Return in about 6 weeks (around 09/23/2024) for f/u with PCP.  Marolyn Noel, MD

## 2024-08-19 ENCOUNTER — Encounter: Payer: Self-pay | Admitting: Neurology

## 2024-08-19 ENCOUNTER — Ambulatory Visit: Admitting: Neurology

## 2024-08-19 VITALS — BP 168/74 | HR 51 | Ht 75.0 in | Wt 226.0 lb

## 2024-08-19 DIAGNOSIS — R29898 Other symptoms and signs involving the musculoskeletal system: Secondary | ICD-10-CM

## 2024-08-19 DIAGNOSIS — R760 Raised antibody titer: Secondary | ICD-10-CM

## 2024-08-19 DIAGNOSIS — D472 Monoclonal gammopathy: Secondary | ICD-10-CM | POA: Diagnosis not present

## 2024-08-19 DIAGNOSIS — G629 Polyneuropathy, unspecified: Secondary | ICD-10-CM

## 2024-08-19 DIAGNOSIS — W19XXXS Unspecified fall, sequela: Secondary | ICD-10-CM

## 2024-08-19 DIAGNOSIS — M5417 Radiculopathy, lumbosacral region: Secondary | ICD-10-CM | POA: Diagnosis not present

## 2024-08-19 DIAGNOSIS — G2582 Stiff-man syndrome: Secondary | ICD-10-CM

## 2024-08-19 NOTE — Patient Instructions (Signed)
 We will not make any changes today.  -Continue to follow up hematology as planned for MGUS -Continue IVIg monthly: next appointment is 09/05/24 -Continue Valium  5 mg as needed, up to 4 times daily -Continue PT and home exercises  I will see you again in about 6 months. Please let me know if you have any questions or concerns in the meantime.   The physicians and staff at [facility] are committed to providing excellent care. You may receive a survey requesting feedback about your experience at our office. We strive to receive very good responses to the survey questions. If you feel that your experience would prevent you from giving the office a very good  response, please contact our office to try to remedy the situation. We may be reached at [phone]. Thank you for taking the time out of your busy day to complete the survey.  Venetia Potters, MD [facility]  Preventing Falls at [facility] are common, often dreaded events in the lives of older people. Aside from the obvious injuries and even death that may result, fall can cause wide-ranging consequences including loss of independence, mental decline, decreased activity and mobility. Younger people are also at risk of falling, especially those with chronic illnesses and fatigue.  Ways to reduce risk for falling Examine diet and medications. Warm foods and alcohol dilate blood vessels, which can lead to dizziness when standing. Sleep aids, antidepressants and pain medications can also increase the likelihood of a fall.  Get a vision exam. Poor vision, cataracts and glaucoma increase the chances of falling.  Check foot gear. Shoes should fit snugly and have a sturdy, nonskid sole and a broad, low heel  Participate in a physician-approved exercise program to build and maintain muscle strength and improve balance and coordination. Programs that use ankle weights or stretch bands are excellent for muscle-strengthening. Water  aerobics programs and low-impact Tai Chi programs have also been shown to improve balance and coordination.  Increase vitamin D  intake. Vitamin D  improves muscle strength and increases the amount of calcium  the body is able to absorb and deposit in bones.  How to prevent falls from common hazards Floors - Remove all loose wires, cords, and throw rugs. Minimize clutter. Make sure rugs are anchored and smooth. Keep furniture in its usual place.  Chairs -- Use chairs with straight backs, armrests and firm seats. Add firm cushions to existing pieces to add height.  Bathroom - Install grab bars and non-skid tape in the tub or shower. Use a bathtub transfer bench or a shower chair with a back support Use an elevated toilet seat and/or safety rails to assist standing from a low surface. Do not use towel racks or bathroom tissue holders to help you stand.  Lighting - Make sure halls, stairways, and entrances are well-lit. Install a night light in your bathroom or hallway. Make sure there is a light switch at the top and bottom of the staircase. Turn lights on if you get up in the middle of the night. Make sure lamps or light switches are within reach of the bed if you have to get up during the night.  Kitchen - Install non-skid rubber mats near the sink and stove. Clean spills immediately. Store frequently used utensils, pots, pans between waist and eye level. This helps prevent reaching and bending. Sit when getting things out of lower cupboards.  Living room/ Bedrooms - Place furniture with wide spaces in between, giving enough room to move around.  Establish a route through the living room that gives you something to hold onto as you walk.  Stairs - Make sure treads, rails, and rugs are secure. Install a rail on both sides of the stairs. If stairs are a threat, it might be helpful to arrange most of your activities on the lower level to reduce the number of times you must climb the stairs.  Entrances  and doorways - Install metal handles on the walls adjacent to the doorknobs of all doors to make it more secure as you travel through the doorway.  Tips for maintaining balance Keep at least one hand free at all times. Try using a backpack or fanny pack to hold things rather than carrying them in your hands. Never carry objects in both hands when walking as this interferes with keeping your balance.  Attempt to swing both arms from front to back while walking. This might require a conscious effort if Parkinson's disease has diminished your movement. It will, however, help you to maintain balance and posture, and reduce fatigue.  Consciously lift your feet off of the ground when walking. Shuffling and dragging of the feet is a common culprit in losing your balance.  When trying to navigate turns, use a U technique of facing forward and making a wide turn, rather than pivoting sharply.  Try to stand with your feet shoulder-length apart. When your feet are close together for any length of time, you increase your risk of losing your balance and falling.  Do one thing at a time. Don't try to walk and accomplish another task, such as reading or looking around. The decrease in your automatic reflexes complicates motor function, so the less distraction, the better.  Do not wear rubber or gripping soled shoes, they might catch on the floor and cause tripping.  Move slowly when changing positions. Use deliberate, concentrated movements and, if needed, use a grab bar or walking aid. Count 15 seconds between each movement. For example, when rising from a seated position, wait 15 seconds after standing to begin walking.  If balance is a continuous problem, you might want to consider a walking aid such as a cane, walking stick, or walker. Once you've mastered walking with help, you might be ready to try it on your own again.

## 2024-08-31 ENCOUNTER — Encounter: Payer: Self-pay | Admitting: Radiology

## 2024-09-05 DIAGNOSIS — G2582 Stiff-man syndrome: Secondary | ICD-10-CM | POA: Diagnosis not present

## 2024-11-09 ENCOUNTER — Inpatient Hospital Stay: Attending: Hematology and Oncology

## 2024-11-09 DIAGNOSIS — D472 Monoclonal gammopathy: Secondary | ICD-10-CM | POA: Insufficient documentation

## 2024-11-09 DIAGNOSIS — D563 Thalassemia minor: Secondary | ICD-10-CM | POA: Diagnosis not present

## 2024-11-09 DIAGNOSIS — R718 Other abnormality of red blood cells: Secondary | ICD-10-CM

## 2024-11-09 LAB — COMPREHENSIVE METABOLIC PANEL WITH GFR
ALT: 14 U/L (ref 0–44)
AST: 25 U/L (ref 15–41)
Albumin: 3.8 g/dL (ref 3.5–5.0)
Alkaline Phosphatase: 82 U/L (ref 38–126)
Anion gap: 8 (ref 5–15)
BUN: 10 mg/dL (ref 8–23)
CO2: 26 mmol/L (ref 22–32)
Calcium: 8.7 mg/dL — ABNORMAL LOW (ref 8.9–10.3)
Chloride: 106 mmol/L (ref 98–111)
Creatinine, Ser: 0.81 mg/dL (ref 0.61–1.24)
GFR, Estimated: >60 mL/min
Glucose, Bld: 104 mg/dL — ABNORMAL HIGH (ref 70–99)
Potassium: 4 mmol/L (ref 3.5–5.1)
Sodium: 140 mmol/L (ref 135–145)
Total Bilirubin: 0.7 mg/dL (ref 0.0–1.2)
Total Protein: 8.3 g/dL — ABNORMAL HIGH (ref 6.5–8.1)

## 2024-11-09 LAB — CBC WITH DIFFERENTIAL/PLATELET
Abs Immature Granulocytes: 0.02 K/uL (ref 0.00–0.07)
Basophils Absolute: 0 K/uL (ref 0.0–0.1)
Basophils Relative: 0 %
Eosinophils Absolute: 0.2 K/uL (ref 0.0–0.5)
Eosinophils Relative: 4 %
HCT: 39.3 % (ref 39.0–52.0)
Hemoglobin: 13.6 g/dL (ref 13.0–17.0)
Immature Granulocytes: 0 %
Lymphocytes Relative: 37 %
Lymphs Abs: 1.8 K/uL (ref 0.7–4.0)
MCH: 24.2 pg — ABNORMAL LOW (ref 26.0–34.0)
MCHC: 34.6 g/dL (ref 30.0–36.0)
MCV: 69.8 fL — ABNORMAL LOW (ref 80.0–100.0)
Monocytes Absolute: 0.4 K/uL (ref 0.1–1.0)
Monocytes Relative: 8 %
Neutro Abs: 2.5 K/uL (ref 1.7–7.7)
Neutrophils Relative %: 51 %
Platelets: 143 K/uL — ABNORMAL LOW (ref 150–400)
RBC: 5.63 MIL/uL (ref 4.22–5.81)
RDW: 15.9 % — ABNORMAL HIGH (ref 11.5–15.5)
WBC: 4.8 K/uL (ref 4.0–10.5)
nRBC: 0 % (ref 0.0–0.2)

## 2024-11-10 LAB — KAPPA/LAMBDA LIGHT CHAINS
Kappa free light chain: 23.4 mg/L — ABNORMAL HIGH (ref 3.3–19.4)
Kappa, lambda light chain ratio: 1.6 (ref 0.26–1.65)
Lambda free light chains: 14.6 mg/L (ref 5.7–26.3)

## 2024-11-11 ENCOUNTER — Other Ambulatory Visit: Payer: Self-pay | Admitting: Neurology

## 2024-11-11 DIAGNOSIS — R760 Raised antibody titer: Secondary | ICD-10-CM

## 2024-11-11 LAB — MULTIPLE MYELOMA PANEL, SERUM
Albumin SerPl Elph-Mcnc: 3.4 g/dL (ref 2.9–4.4)
Albumin/Glob SerPl: 0.9 (ref 0.7–1.7)
Alpha 1: 0.2 g/dL (ref 0.0–0.4)
Alpha2 Glob SerPl Elph-Mcnc: 0.5 g/dL (ref 0.4–1.0)
B-Globulin SerPl Elph-Mcnc: 1 g/dL (ref 0.7–1.3)
Gamma Glob SerPl Elph-Mcnc: 2.5 g/dL — ABNORMAL HIGH (ref 0.4–1.8)
Globulin, Total: 4.2 g/dL — ABNORMAL HIGH (ref 2.2–3.9)
IgA: 292 mg/dL (ref 61–437)
IgG (Immunoglobin G), Serum: 2785 mg/dL — ABNORMAL HIGH (ref 603–1613)
IgM (Immunoglobulin M), Srm: 58 mg/dL (ref 20–172)
M Protein SerPl Elph-Mcnc: 1.2 g/dL — ABNORMAL HIGH
Total Protein ELP: 7.6 g/dL (ref 6.0–8.5)

## 2024-11-12 LAB — HGB FRACTIONATION CASCADE

## 2024-11-12 LAB — HGB FRACTIONATION BY HPLC
Hgb A2: 3.5 % — ABNORMAL HIGH (ref 1.8–3.2)
Hgb A: 69.1 % — ABNORMAL LOW (ref 96.4–98.8)
Hgb C: 27.4 % — ABNORMAL HIGH
Hgb E: 0 %
Hgb F: 0 % (ref 0.0–2.0)
Hgb S: 0 %
Hgb Variant: 0 %

## 2024-11-16 LAB — ALPHA-THALASSEMIA ANALYSIS: IMAGE: 0

## 2024-11-17 ENCOUNTER — Encounter: Payer: Self-pay | Admitting: *Deleted

## 2024-11-17 NOTE — Progress Notes (Signed)
 Michael [name]                                          MRN: [id number]   11/17/2024   The [facility] Quality Team Specialist reviewed this patient medical record for the purposes of chart review for care gap closure. The following were reviewed: chart review for care gap closure-controlling blood pressure.    [facility] Quality Team

## 2024-11-19 ENCOUNTER — Inpatient Hospital Stay (HOSPITAL_BASED_OUTPATIENT_CLINIC_OR_DEPARTMENT_OTHER): Admitting: Hematology and Oncology

## 2024-11-19 VITALS — BP 149/54 | HR 67 | Temp 98.6°F | Resp 18 | Ht 75.0 in | Wt 232.2 lb

## 2024-11-19 DIAGNOSIS — D472 Monoclonal gammopathy: Secondary | ICD-10-CM | POA: Diagnosis not present

## 2024-11-19 DIAGNOSIS — D568 Other thalassemias: Secondary | ICD-10-CM

## 2024-11-20 ENCOUNTER — Encounter: Payer: Self-pay | Admitting: Hematology and Oncology

## 2024-11-20 DIAGNOSIS — D568 Other thalassemias: Secondary | ICD-10-CM | POA: Insufficient documentation

## 2024-11-20 NOTE — Assessment & Plan Note (Addendum)
 He was noted to have microcytosis for some time Hemoglobin electrophoresis and thalassemia gene analysis results were discussed with the patient He has complex hemoglobin C-thalassemia trait We discussed inheritance pattern for this condition He does not need further treatment or workup for this

## 2024-11-20 NOTE — Progress Notes (Signed)
 [facility] OFFICE PROGRESS NOTE  Patient Care Team: Joshua Debby CROME, MD as PCP - General (Internal Medicine) Nahser, Aleene PARAS, MD (Inactive) as PCP - Cardiology (Cardiology) Leigh Venetia CROME, MD as Consulting Physician (Neurology)  ASSESSMENT & PLAN:  Assessment & Plan MGUS (monoclonal gammopathy of unknown significance) I have reviewed his myeloma panel with the patient Overall, his M protein appears stable compared to his blood work 6 months ago His IgG level is high because he has been receiving IVIG at home However, his light chains are just borderline elevated without associated renal dysfunction or hypercalcemia The patient is mildly anemic but not symptomatic I recommend close monitoring and follow-up in 6 months 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    INTERVAL HISTORY: he returns for surveillance follow-up for diagnosis of MGUS and evaluation for microcytosis Patient denies recurrent infection or bone pain We reviewed recent CBC, CMP and myeloma panel results  PHYSICAL EXAMINATION: ECOG PERFORMANCE STATUS: 0 - Asymptomatic  Vitals:   11/19/24 1125  BP: (!) 149/54  Pulse: 67  Resp: 18  Temp: 98.6 F (37 C)  SpO2: 100%

## 2024-11-20 NOTE — Assessment & Plan Note (Addendum)
 I have reviewed his myeloma panel with the patient Overall, his M protein appears stable compared to his blood work 6 months ago His IgG level is high because he has been receiving IVIG at home However, his light chains are just borderline elevated without associated renal dysfunction or hypercalcemia The patient is mildly anemic but not symptomatic I recommend close monitoring and follow-up in 6 months

## 2025-01-22 ENCOUNTER — Ambulatory Visit

## 2025-02-17 ENCOUNTER — Ambulatory Visit: Admitting: Neurology

## 2025-05-10 ENCOUNTER — Inpatient Hospital Stay

## 2025-05-20 ENCOUNTER — Inpatient Hospital Stay: Admitting: Hematology and Oncology
